# Patient Record
Sex: Male | Born: 1960 | Race: Black or African American | Hispanic: No | Smoking: Current every day smoker
Health system: Southern US, Community
[De-identification: ages and names within clinical notes are randomized; demographics above are authoritative.]

## PROBLEM LIST (undated history)

## (undated) DIAGNOSIS — E119 Type 2 diabetes mellitus without complications: Secondary | ICD-10-CM

## (undated) DIAGNOSIS — F3164 Bipolar disorder, current episode mixed, severe, with psychotic features: Secondary | ICD-10-CM

## (undated) DIAGNOSIS — R600 Localized edema: Secondary | ICD-10-CM

## (undated) DIAGNOSIS — F32A Depression, unspecified: Secondary | ICD-10-CM

## (undated) DIAGNOSIS — F209 Schizophrenia, unspecified: Secondary | ICD-10-CM

## (undated) DIAGNOSIS — I10 Essential (primary) hypertension: Secondary | ICD-10-CM

## (undated) DIAGNOSIS — M797 Fibromyalgia: Secondary | ICD-10-CM

## (undated) DIAGNOSIS — J189 Pneumonia, unspecified organism: Secondary | ICD-10-CM

## (undated) DIAGNOSIS — M1990 Unspecified osteoarthritis, unspecified site: Secondary | ICD-10-CM

## (undated) DIAGNOSIS — F1920 Other psychoactive substance dependence, uncomplicated: Secondary | ICD-10-CM

## (undated) DIAGNOSIS — Z720 Tobacco use: Secondary | ICD-10-CM

## (undated) DIAGNOSIS — R079 Chest pain, unspecified: Secondary | ICD-10-CM

## (undated) DIAGNOSIS — I219 Acute myocardial infarction, unspecified: Secondary | ICD-10-CM

## (undated) DIAGNOSIS — R739 Hyperglycemia, unspecified: Secondary | ICD-10-CM

## (undated) DIAGNOSIS — M545 Low back pain, unspecified: Secondary | ICD-10-CM

## (undated) DIAGNOSIS — J181 Lobar pneumonia, unspecified organism: Secondary | ICD-10-CM

## (undated) DIAGNOSIS — M5136 Other intervertebral disc degeneration, lumbar region: Secondary | ICD-10-CM

## (undated) DIAGNOSIS — F419 Anxiety disorder, unspecified: Secondary | ICD-10-CM

## (undated) DIAGNOSIS — E7800 Pure hypercholesterolemia, unspecified: Secondary | ICD-10-CM

## (undated) DIAGNOSIS — I209 Angina pectoris, unspecified: Secondary | ICD-10-CM

## (undated) DIAGNOSIS — F329 Major depressive disorder, single episode, unspecified: Secondary | ICD-10-CM

## (undated) DIAGNOSIS — R0602 Shortness of breath: Secondary | ICD-10-CM

## (undated) DIAGNOSIS — F14151 Cocaine abuse with cocaine-induced psychotic disorder with hallucinations: Secondary | ICD-10-CM

## (undated) DIAGNOSIS — K219 Gastro-esophageal reflux disease without esophagitis: Secondary | ICD-10-CM

## (undated) DIAGNOSIS — F250 Schizoaffective disorder, bipolar type: Secondary | ICD-10-CM

## (undated) DIAGNOSIS — M51369 Other intervertebral disc degeneration, lumbar region without mention of lumbar back pain or lower extremity pain: Secondary | ICD-10-CM

## (undated) DIAGNOSIS — G8929 Other chronic pain: Secondary | ICD-10-CM

## (undated) DIAGNOSIS — F2089 Other schizophrenia: Secondary | ICD-10-CM

## (undated) HISTORY — DX: Pneumonia, unspecified organism: J18.9

## (undated) HISTORY — DX: Schizoaffective disorder, bipolar type: F25.0

## (undated) HISTORY — DX: Type 2 diabetes mellitus without complications: E11.9

## (undated) HISTORY — DX: Localized edema: R60.0

## (undated) HISTORY — DX: Bipolar disorder, current episode mixed, severe, with psychotic features: F31.64

## (undated) HISTORY — DX: Chest pain, unspecified: R07.9

## (undated) HISTORY — DX: Other schizophrenia: F20.89

## (undated) HISTORY — PX: INGUINAL HERNIA REPAIR: SUR1180

## (undated) HISTORY — DX: Angina pectoris, unspecified: I20.9

## (undated) HISTORY — DX: Shortness of breath: R06.02

## (undated) HISTORY — DX: Lobar pneumonia, unspecified organism: J18.1

## (undated) HISTORY — DX: Hyperglycemia, unspecified: R73.9

## (undated) HISTORY — DX: Other psychoactive substance dependence, uncomplicated: F19.20

## (undated) HISTORY — DX: Unspecified osteoarthritis, unspecified site: M19.90

## (undated) HISTORY — DX: Cocaine abuse with cocaine-induced psychotic disorder with hallucinations: F14.151

## (undated) HISTORY — DX: Tobacco use: Z72.0

## (undated) HISTORY — PX: PARATHYROIDECTOMY: SHX19

---

## 1987-09-05 DIAGNOSIS — J189 Pneumonia, unspecified organism: Secondary | ICD-10-CM

## 1987-09-05 HISTORY — DX: Pneumonia, unspecified organism: J18.9

## 1998-09-04 HISTORY — PX: PERICARDIAL TAP: SHX6020

## 2000-09-04 DIAGNOSIS — I219 Acute myocardial infarction, unspecified: Secondary | ICD-10-CM

## 2000-09-04 HISTORY — DX: Acute myocardial infarction, unspecified: I21.9

## 2005-10-20 ENCOUNTER — Emergency Department (HOSPITAL_COMMUNITY): Admission: EM | Admit: 2005-10-20 | Discharge: 2005-10-20 | Payer: Self-pay | Admitting: Emergency Medicine

## 2006-10-01 ENCOUNTER — Emergency Department (HOSPITAL_COMMUNITY): Admission: EM | Admit: 2006-10-01 | Discharge: 2006-10-01 | Payer: Self-pay | Admitting: Emergency Medicine

## 2006-12-04 HISTORY — PX: CARDIAC CATHETERIZATION: SHX172

## 2006-12-24 ENCOUNTER — Emergency Department (HOSPITAL_COMMUNITY): Admission: EM | Admit: 2006-12-24 | Discharge: 2006-12-24 | Payer: Self-pay | Admitting: Emergency Medicine

## 2006-12-29 ENCOUNTER — Inpatient Hospital Stay (HOSPITAL_COMMUNITY): Admission: EM | Admit: 2006-12-29 | Discharge: 2007-01-01 | Payer: Self-pay | Admitting: Emergency Medicine

## 2006-12-29 ENCOUNTER — Encounter (INDEPENDENT_AMBULATORY_CARE_PROVIDER_SITE_OTHER): Payer: Self-pay | Admitting: *Deleted

## 2007-07-25 ENCOUNTER — Emergency Department (HOSPITAL_COMMUNITY): Admission: EM | Admit: 2007-07-25 | Discharge: 2007-07-25 | Payer: Self-pay | Admitting: Emergency Medicine

## 2011-01-20 NOTE — Consult Note (Signed)
NAME:  Charles Orr                   ACCOUNT NO.:  [id number]   MEDICAL RECORD NO.:  [id number]          PATIENT TYPE:  INP   LOCATION:  3731                         FACILITY:  [facility]   PHYSICIAN:  Deanna Artis. Hickling, M.D.DATE OF BIRTH:  [birth year]   DATE OF CONSULTATION:  12/30/2006  DATE OF DISCHARGE:                                 CONSULTATION   CHIEF COMPLAINT:  Diplopia.   HISTORY OF PRESENT CONDITION:  Charles Orr is a 50 year old African-  American gentleman admitted to [facility] with chest pain and  alterations in his vision.   The patient was seen in the emergency room with complaints of  double  vision of one weeks' duration.  He had been seen in the emergency room  the day before complaining of blurred vision, evaluated with a CT scan  of the brain, and discharged home.   The patient had substernal chest pain while mowing his yard, had a  burning feeling and felt that he could not see well.  Shortness of  breath and nausea lasted for at least 2 minutes.   The patient was admitted to the hospital, and evaluation suggested the  need for cardiac catheterization.   I was asked to see the patient because of persistent complaints of  double vision with left gaze.   The patient has an 18-year history of diabetes mellitus that is non-  insulin dependent.  He has a history of hypertension and feeling of  dizziness that began coincident with his double vision.   REVIEW OF SYSTEMS:  The patient has not had any fever or constitutional  symptoms.  He has had normal appetite.  He has not had intercurrent  infections in head, neck lungs, GI, GU, rash, and anemia, or bruises.  Diabetes or thyroid disease.  He says in the past he has had bronchitis.  The remainder of his 12-system review is recorded on the chart and is  negative.   PAST MEDICAL HISTORY:  Serious illnesses.   PAST SURGICAL HISTORY:  None.   CURRENT MEDICATIONS:  1. Aspirin 325 mg per day.  2.  Metoprolol 25 mg b.i.d.  3. Colace 200 mg daily.  4. Protonix 40 mg daily.  5. Hydrochlorothiazide 25 mg daily.   DRUG ALLERGIES:  None known.   FAMILY HISTORY:  Negative for coronary artery disease.  There is history  of hypertension and diabetes.  No history of stroke.   SOCIAL HISTORY:  The patient is one-pack-per-day smoker.  He used  cocaine, last time a week ago.   PHYSICAL EXAMINATION:  GENERAL:  On examination today, this is a well-  developed, well-nourished gentleman in no acute distress.  VITAL SIGNS:  Temperature 97.4, blood pressure 99/54, resting pulse 65,  respirations 20, oxygen saturation 97%.  EARS, NOSE, AND THROAT:  No infections.  NECK:  No bruits.  Supple neck.  No meningismus.  LUNGS:  Clear to auscultation.  HEART:  No murmurs.  Pulses normal.  ABDOMEN:  Soft.  Bowel sounds normal.  No hepatosplenomegaly.  EXTREMITIES:  Were normal.  NEUROLOGIC:  The  patient is alert. No dysphasia, dysarthria, dyspraxia.  Cranial nerves:  Round reactive pupils.  Normal fundi.  Extraocular  movements show decreased abduction of the left eye and diplopia with  left gaze. Symmetric facial strength otherwise. Midline tongue and  uvula.  Air conduction greater than bone conduction.  Motor examination:  Normal strength, tone and mass.  Good fine motor movements.  No pronator  drift.  Sensation: Stocking neuropathy.  Good stereognosis.  Cerebellar  examination:  Good finger-to-nose. Rapid movements, heel-knee-shin were  good.  In gait, he falls backwards and to the right when his eyes were  closed or when he tries to tandem. Otherwise his gait was quite normal.  Reflexes were diminished.  The patient had bilateral flexor plantar  responses.   IMPRESSION:  1. 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  This is very common with diabetes.  2. The patient has disequilibrium and falling backwards and to the      right.  This  could not be easily accounted for by his sixth nerve      palsy.   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  There is nothing to  suggest an acute process that began a week ago.  His MRA is normal.  He  has multiple risk factors for stroke including smoking, 18-year history  of  diabetes mellitus, hypertension and cocaine use.   PLAN:  1. We will patch his eye.  This can be alternated. He needs to see an      eye doctor for further evaluation of the sixth nerve.  2. Optimize treatment of diabetes.  3. Check lipid panel, homocysteine, PT for gait.  4. Continue aspirin.  5. Smoking cessation.  6. Cocaine use is a risk factor for stroke and needs to be stopped.   I appreciate the opportunity to participate in his care.  The patient is  appropriately concerned with his condition.      Deanna Artis. Sharene Skeans, M.D.  Electronically Signed    [facility]/MEDQ  D:  12/30/2006  T:  12/30/2006  Job:  [id number]   cc:   Dani Gobble, MD

## 2011-01-20 NOTE — H&P (Signed)
NAME:  Charles Orr, Charles Orr                   ACCOUNT NO.:  [id number]   MEDICAL RECORD NO.:  [id number]          PATIENT TYPE:  INP   LOCATION:  3731                         FACILITY:  [facility]   PHYSICIAN:  Dani Gobble, MD       DATE OF BIRTH:  09/07/1960   DATE OF ADMISSION:  12/29/2006  DATE OF DISCHARGE:                              HISTORY & PHYSICAL   CHIEF COMPLAINT:  Chest pain.   HISTORY OF PRESENT ILLNESS:  The patient is a 50 year old male who was  admitted to the emergency room tonight with complaints of substernal  chest pain and double vision.  He was noted to have an abnormal EKG with  inferior T wave inversion and ST elevation in lead 1 and AVL.  He was  seen in the emergency room last week for blurred vision in 1 eye.  Yesterday, the patient noticed some substernal chest pain and shortness  of breath after mowing his yard.  He described a burning chest pain with  shortness of breath and nausea that lasted about 2 to 5 minutes.  He has  no prior history of coronary disease.  He is seen now in the emergency  room for further evaluation after his EKG in the emergency room was  abnormal.   PAST MEDICAL HISTORY:  Remarkable for what sounds like a pericardial  effusion tap in 2000 in [place].  He says 2 cardiologists stuck needles  in his chest and drew out fluid.  He has no prior history of coronary  disease or chest pain.  When he was seen for his blurred vision a week  ago, he was diagnosed with hypertension and put on a diuretic.  He does  have a history of diabetes.  He has been without primary care for more  than a year.  His original primary care doctor moved away.  He has had a  remote right hernia repair.   CURRENT MEDICATIONS:  1. Metformin b.i.d.  2. HCTZ b.i.d.  3. Meclizine p.r.n.   ALLERGIES:  HE HAS NO KNOWN DRUG ALLERGIES.   SOCIAL HISTORY:  He is a pack-a-day smoker.  He does maintenance and  yard work.  He does admit to some intermittent cocaine use, the  last use  was more than a week ago.   FAMILY HISTORY:  Unremarkable for coronary disease.   REVIEW OF SYSTEMS:  Essentially unremarkable, except for noted above, he  has complained of double vision in his left eye for about 2 weeks.   PHYSICAL EXAMINATION:  VITAL SIGNS:  Blood pressure 130/90, pulse 82,  respirations 12.  GENERAL:  He is a well-developed, well-nourished, African American male  in no acute distress.  HEENT:  Normocephalic.  Extraocular movements are intact.  Sclerae are  not icteric.  NECK:  Without bruits or JVD.  Thyroid is not enlarged.  CHEST:  Clear to auscultation and percussion.  CARDIOVASCULAR:  Regular rate and rhythm without obvious murmurs, rubs,  or gallop.  Positive S4.  ABDOMEN:  Nontender.  No hepatosplenomegaly.  No bruits.  Bowel sounds  are  present.  EXTREMITIES:  Without edema.  He has 2+/4 distal pulses.  NEURO EXAM:  Grossly intact.  He is awake, alert, oriented, and  cooperative.  Moves all extremities without obvious deficit.  SKIN:  Warm and dry.   LABORATORY DATA:  White count 9.2, hemoglobin 16.9, hematocrit 50.5,  platelets 269.  Sodium 130, potassium 4.3, BUN 22, creatinine 0.9,  glucose 285.  CK, MB, and troponin are negative x1.   An MRI scan of his head is negative.   IMPRESSION:  1. Unstable angina.  2. Abnormal electrocardiogram.  3. Hypertension, newly diagnosed and poorly controlled.  4. Non-insulin dependent diabetes.  5. Smoking.  6. Past history of cocaine use.  7. Blurred vision in his left eye for 2 weeks of unclear etiology.   PLAN:  The patient was admitted to telemetry, started on heparin.  We  will add nitrate.  He will be ruled out for an MI.  We will go ahead and  put him on an aspirin and beta blocker and get an echo, he will probably  need diagnostic catheterization.  The patient was seen by Dr. Domingo Sep  and myself in the emergency room.      Abelino Derrick, P.A.    ______________________________   Dani Gobble, MD    [facility]/MEDQ  D:  01/01/2007  T:  01/01/2007  Job:  [id number]

## 2011-01-20 NOTE — Discharge Summary (Signed)
NAME:  Charles Orr, Charles Orr                   ACCOUNT NO.:  [id number]   MEDICAL RECORD NO.:  [id number]          PATIENT TYPE:  INP   LOCATION:  3731                         FACILITY:  [facility]   PHYSICIAN:  Abelino Derrick, P.A.   DATE OF BIRTH:  05/30/1961   DATE OF ADMISSION:  12/29/2006  DATE OF DISCHARGE:  01/01/2007                               DISCHARGE SUMMARY   DISCHARGE DIAGNOSIS:  1. Chest pain consistent with unstable angina.  2. Abnormal EKG.  3. Normal coronaries at catheterization.  4. Hypertension with mild LV dysfunction with an EF of 45% at cath.  5. 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  6. History of smoking.  7. Hypertension, newly diagnosed and poorly controlled, improved at      discharge   HOSPITAL COURSE:  Charles Orr is a 50 year old male who was admitted  through the emergency room 12/29/2006.  He had been seen in the  emergency room about a week or so ago with some blurred vision in one  eye.  He was given meclizine.  He was also started on a diuretic at that  time for hypertension.  He continued to have some intermittent left eye  diplopia.  On the day before admission he had some exertional chest pain  associated with some shortness of breath and nausea.  He described a  with midsternal chest burning sensation after mowing his yard.  He came  back to the emergency room on the 26th.  His EKG was abnormal with  inferior T-wave inversion and ST elevation in V1 and AVL.  The patient  was seen by Dr. Domingo Sep on admission and then admitted to telemetry,  started on heparin and nitrates.  He also had a beta blocker and  aspirin, PPI and held his metformin.  His past medical history was  remarkable for diabetes.  He also describes what sounded like a  pericardial tap in [place] in 2000.  He has not seen a family doctor  for some time, apparently his primary care doctor moved away.  The  patient's enzymes were negative  for an MI.  He was seen in consult by  Dr. Sharene Skeans with the neurology service.  MRI was essentially negative  for acute stroke.  Dr. Sharene Skeans felt he had sixth nerve neuropathy from  diabetes.  The patient underwent diagnostic catheterization 12/31/2006.  This revealed normal coronaries with an EF of 45-50%.  We feel he can be  discharged 01/01/2007.   DISCHARGE MEDICATIONS:  1. Enteric coated aspirin 325 mg a day  2. Lisinopril HCTZ 10/12.5 once a day  3. Metformin 500 mg twice a day to start Thursday  4. Glucotrol XL 5 mg a day  5. Coreg 25 mg one-half b.i.d.   LABS:  A CK-MB and troponins were negative.  Hemoglobin A1c was 12.5.  INR is 1.2.  Lipid panel shows a cholesterol of 157, triglycerides 306,  HDL 25, LDL  71.  Drug screen was negative for cocaine or other drugs of  abuse. Urinalysis  did show some glucosuria and ketones.  Renal function  at discharge shows sodium 137, potassium 4.1, CO2 26, BUN 11, creatinine  0.8, white count 6.3, hemoglobin 15, hematocrit 44.4, platelets 232.  MRI of the brain done on the 26th showed no acute infarct, nonspecific  white matter changes.  Chest X-Ray: Showed  low lung volumes and  atelectasis at the bases.  Telemetry showed normal sinus rhythm without  arrhythmia.   DISPOSITION:  Patient is discharged in stable condition.  He was  admitted by Dr. Domingo Sep but lives in [place], he will follow up with  Dr. Jenne Campus since Dr. Jenne Campus did his cath.  He will need a referral to  a primary care doctor.      Abelino Derrick, P.Memory Dance  D:  01/01/2007  T:  01/01/2007  Job:  [id number]   cc:   Darlin Priestly, MD

## 2011-01-20 NOTE — Cardiovascular Report (Signed)
NAME:  [name], [name] NO.:  [id number]   MEDICAL RECORD NO.:  [id number]          PATIENT TYPE:  INP   LOCATION:  3731                         FACILITY:  [facility]   PHYSICIAN:  Darlin Priestly, MD  DATE OF BIRTH:  [birth year]   DATE OF PROCEDURE:  12/31/2006  DATE OF DISCHARGE:                            CARDIAC CATHETERIZATION   [name] is a 50 year old male with a history of tobacco use, cocaine  use, non-insulin dependent diabetes mellitus.  He was admitted on December 29, 2006, with substernal chest pain and abnormal EKG.  He is  subsequently ruled out for myocardial infarction.  He is now brought for  cardiac catheterization to rule out significant CAD.   DESCRIPTION OF OPERATION:  After obtaining informed consent, the patient  was brought to the cardiac cath lab, where the right and left groin were  shaved, prepped and draped in the usual sterile fashion.  An ECG monitor  was established.  Using the modified Seldinger technique, a #6 French  arterial sheath inserted in the right femoral artery.  A 6-French  diagnostic catheter was then performed.  Diagnostic angiography was  done.   The left main is a large vessel with no significant disease.   The LAD is a large vessel which coursed through the apex and gives rise  to two diagonal branches.  The LAD has no severe disease.   The first diagonal is a medium-sized vessel which bifurcates distally  with no significant disease.   The second diagonal is a small-to-medium-sized vessel with no  significant disease.   The left circumflex is a medium-sized vessel coursing the AV groove and  gives rise to two obtuse marginal branches.  The AV circumflex has no  significant disease.   The first OM is a medium-sized vessel which bifurcates distally with no  significant disease.   The second OM is a small-sized with no significant disease.   The right coronary artery is a large vessel, dominant, and gives rise  to  both the PDA as well as the posterolateral branch.  There is no  significant disease in the RCA and PDA and posterolateral branch.   The left ventriculogram reveals a mildly-depressed EF of 45-50% with  mild anterolateral hypokinesis.   Hemodynamic systemic arterial pressure 101/65, LV systemic system  pressure of 101/2, LVEDP of 8.   CONCLUSIONS:  1. No significant coronary artery disease.  2. Mildly-depressed left ventricular systolic function.      Darlin Priestly, MD  Electronically Signed     [facility]/MEDQ  D:  12/31/2006  T:  12/31/2006  Job:  [id number]

## 2015-09-02 ENCOUNTER — Emergency Department (HOSPITAL_COMMUNITY)
Admission: EM | Admit: 2015-09-02 | Discharge: 2015-09-02 | Disposition: A | Discharge status: Home / Self Care | Payer: Medicaid Other | Attending: Emergency Medicine | Admitting: Emergency Medicine

## 2015-09-02 ENCOUNTER — Encounter (HOSPITAL_COMMUNITY): Payer: Self-pay

## 2015-09-02 DIAGNOSIS — F17200 Nicotine dependence, unspecified, uncomplicated: Secondary | ICD-10-CM | POA: Diagnosis not present

## 2015-09-02 DIAGNOSIS — E119 Type 2 diabetes mellitus without complications: Secondary | ICD-10-CM | POA: Insufficient documentation

## 2015-09-02 DIAGNOSIS — E1140 Type 2 diabetes mellitus with diabetic neuropathy, unspecified: Secondary | ICD-10-CM | POA: Diagnosis not present

## 2015-09-02 DIAGNOSIS — M5416 Radiculopathy, lumbar region: Secondary | ICD-10-CM

## 2015-09-02 DIAGNOSIS — R200 Anesthesia of skin: Secondary | ICD-10-CM | POA: Diagnosis not present

## 2015-09-02 DIAGNOSIS — M545 Low back pain: Secondary | ICD-10-CM | POA: Diagnosis present

## 2015-09-02 DIAGNOSIS — I10 Essential (primary) hypertension: Secondary | ICD-10-CM | POA: Diagnosis not present

## 2015-09-02 DIAGNOSIS — G8929 Other chronic pain: Secondary | ICD-10-CM | POA: Insufficient documentation

## 2015-09-02 HISTORY — DX: Other intervertebral disc degeneration, lumbar region without mention of lumbar back pain or lower extremity pain: M51.369

## 2015-09-02 HISTORY — DX: Other intervertebral disc degeneration, lumbar region: M51.36

## 2015-09-02 HISTORY — DX: Essential (primary) hypertension: I10

## 2015-09-02 LAB — CBG MONITORING, ED: Glucose-Capillary: 303 mg/dL — ABNORMAL HIGH (ref 65–99)

## 2015-09-02 MED ORDER — INSULIN ASPART PROT & ASPART (70-30 MIX) 100 UNIT/ML ~~LOC~~ SUSP: 20.0000 [IU] | Freq: Two times a day (BID) | SUBCUTANEOUS | Status: DC

## 2015-09-02 MED ORDER — AMLODIPINE BESYLATE 10 MG PO TABS: 10.0000 mg | ORAL_TABLET | Freq: Every day | ORAL | Status: DC

## 2015-09-02 MED ORDER — GABAPENTIN 600 MG PO TABS: 600.0000 mg | ORAL_TABLET | Freq: Two times a day (BID) | ORAL | Status: DC

## 2015-09-02 MED ORDER — ONDANSETRON 8 MG PO TBDP
8.0000 mg | ORAL_TABLET | Freq: Once | ORAL | Status: AC
  Administered 2015-09-02: 8 mg via ORAL
  Filled 2015-09-02: qty 1

## 2015-09-02 MED ORDER — HYDROMORPHONE HCL 2 MG/ML IJ SOLN
2.0000 mg | Freq: Once | INTRAMUSCULAR | Status: AC
  Administered 2015-09-02: 2 mg via INTRAMUSCULAR
  Filled 2015-09-02: qty 1

## 2015-09-02 MED ORDER — GLIPIZIDE 5 MG PO TABS: 5.0000 mg | ORAL_TABLET | Freq: Every day | ORAL | Status: DC

## 2015-09-02 MED ORDER — METFORMIN HCL 1000 MG PO TABS: 1000.0000 mg | ORAL_TABLET | Freq: Two times a day (BID) | ORAL | Status: DC

## 2015-09-02 MED ORDER — CELECOXIB 200 MG PO CAPS: 200.0000 mg | ORAL_CAPSULE | Freq: Two times a day (BID) | ORAL | Status: DC

## 2015-09-02 MED ORDER — CYCLOBENZAPRINE HCL 10 MG PO TABS: 10.0000 mg | ORAL_TABLET | Freq: Three times a day (TID) | ORAL | Status: DC | PRN

## 2015-09-02 MED ORDER — OXYCODONE-ACETAMINOPHEN 5-325 MG PO TABS: 1.0000 | ORAL_TABLET | ORAL | Status: DC | PRN

## 2015-09-02 NOTE — ED Notes (Signed)
Patient c/o lower back pain that radiates down bilateral legs X3 weeks.

## 2015-09-02 NOTE — ED Provider Notes (Signed)
CSN: [id number]     Arrival date & time 09/02/15  2130 History   First MD Initiated Contact with Patient 09/02/15 [id number]     Chief Complaint  Patient presents with  . Back Pain     (Consider location/radiation/quality/duration/timing/severity/associated sxs/prior Treatment) HPI Comments: Patient presents with his father for evaluation of low back pain. Patient has a history of degenerative disc disease in his low back, has a history of chronic back pain. Patient reports that over the last 3 weeks, however, he has been noticing increased pain in his lower back with radiation to both legs. He reports that the radiation is worse in the right leg. He has not noticed any weakness. There is increased numbness and tingling. He has a history of diabetic neuropathy, however, has chronic numbness in his lower extremities. Patient has not had any change in his bowel or bladder function. There has not been any recent injury. Patient lives in [place], has a primary doctor in [place]. He is currently here visiting his family.  Patient is a 54 y.o. male presenting with back pain.  Back Pain Associated symptoms: numbness     Past Medical History  Diagnosis Date  . Hypertension   . Diabetes mellitus without complication (HCC)   . Degenerative disc disease, lumbar    Past Surgical History  Procedure Laterality Date  . Hernia repair    . Cardiac surgery    . Parathyroidectomy     History reviewed. No pertinent family history. Social History  Substance Use Topics  . Smoking status: Current Every Day Smoker  . Smokeless tobacco: None  . Alcohol Use: No    Review of Systems  Musculoskeletal: Positive for back pain.  Neurological: Positive for numbness.  All other systems reviewed and are negative.     Allergies  Review of patient's allergies indicates no known allergies.  Home Medications   Prior to Admission medications   Not on File   BP 146/98 mmHg  Pulse 105  Temp(Src) 97.7 F  (36.5 C) (Oral)  Resp 20  Ht  (1.88 m)  Wt 233 lb (105.688 kg)  BMI 29.90 kg/m2  SpO2 100% Physical Exam  Constitutional: He is oriented to person, place, and time. He appears well-developed and well-nourished. No distress.  HENT:  Head: Normocephalic and atraumatic.  Right Ear: Hearing normal.  Left Ear: Hearing normal.  Nose: Nose normal.  Mouth/Throat: Oropharynx is clear and moist and mucous membranes are normal.  Eyes: Conjunctivae and EOM are normal. Pupils are equal, round, and reactive to light.  Neck: Normal range of motion. Neck supple.  Cardiovascular: Regular rhythm, S1 normal and S2 normal.  Exam reveals no gallop and no friction rub.   No murmur heard. Pulmonary/Chest: Effort normal and breath sounds normal. No respiratory distress. He exhibits no tenderness.  Abdominal: Soft. Normal appearance and bowel sounds are normal. There is no hepatosplenomegaly. There is no tenderness. There is no rebound, no guarding, no tenderness at McBurney's point and negative Murphy's sign. No hernia.  Musculoskeletal: Normal range of motion.       Lumbar back: He exhibits tenderness.       Back:  Neurological: He is alert and oriented to person, place, and time. He has normal strength. No cranial nerve deficit or sensory deficit. Coordination normal. GCS eye subscore is 4. GCS verbal subscore is 5. GCS motor subscore is 6.  Skin: Skin is warm, dry and intact. No rash noted. No cyanosis.  Psychiatric: He has a  normal mood and affect. His speech is normal and behavior is normal. Thought content normal.  Nursing note and vitals reviewed.   ED Course  Procedures (including critical care time) Labs Review Labs Reviewed - No data to display  Imaging Review No results found. I have personally reviewed and evaluated these images and lab results as part of my medical decision-making.   EKG Interpretation None      MDM   Final diagnoses:  None   lumbar  radiculopathy  Presents to the ER for evaluation of low back pain. Patient has history of chronic low back pain. He reports a history of degenerative disc disease. Patient does not have any strength deficit is lower extremities. Reflexes are present. Sensation is appropriate for him, considering he has bilateral diabetic neuropathy. There is no saddle anesthesia or foot drop. Patient is currently in the area visiting family. I have counseled him that he does need to schedule follow-up with his primary doctor in [place], as he may need formal MRI or other testing if he does not improve. He does not, however, require emergent imaging. Patient is diabetic and his blood sugar is somewhat uncontrolled. Blood sugar was 303 this morning. He did, however, take 20 units of 7030 before arrival. He will not, therefore, be given any additional insulin, but I do not feel comfortable prescribing him prednisone for his back. Patient therefore treated with analgesia, muscle relaxer and follow-up as described above.    Gilda Creasehristopher J Mechell Girgis, MD 09/02/15 [id number]

## 2015-09-02 NOTE — Discharge Instructions (Signed)
Lumbosacral Radiculopathy °Lumbosacral radiculopathy is a condition that involves the spinal nerves and nerve roots in the low back and bottom of the spine. The condition develops when these nerves and nerve roots move out of place or become inflamed and cause symptoms. °CAUSES °This condition may be caused by: °· Pressure from a disk that bulges out of place (herniated disk). A disk is a plate of cartilage that separates bones in the spine. °· Disk degeneration. °· A narrowing of the bones of the lower back (spinal stenosis). °· A tumor. °· An infection. °· An injury that places sudden pressure on the disks that cushion the bones of your lower spine. °RISK FACTORS °This condition is more likely to develop in: °· Males aged 30-50 years. °· Females aged 50-60 years. °· People who lift improperly. °· People who are overweight or live a sedentary lifestyle. °· People who smoke. °· People who perform repetitive activities that strain the spine. °SYMPTOMS °Symptoms of this condition include: °· Pain that goes down from the back into the legs (sciatica). This is the most common symptom. The pain may be worse with sitting, coughing, or sneezing. °· Pain and numbness in the arms and legs. °· Muscle weakness. °· Tingling. °· Loss of bladder control or bowel control. °DIAGNOSIS °This condition is diagnosed with a physical exam and medical history. If the pain is lasting, you may have tests, such as: °· MRI scan. °· X-ray. °· CT scan. °· Myelogram. °· Nerve conduction study. °TREATMENT °This condition is often treated with: °· Hot packs and ice applied to affected areas. °· Stretches to improve flexibility. °· Exercises to strengthen back muscles. °· Physical therapy. °· Pain medicine. °· A steroid injection in the spine. °In some cases, no treatment is needed. If the condition is long-lasting (chronic), or if symptoms are severe, treatment may involve surgery or lifestyle changes, such as following a weight loss plan. °HOME  CARE INSTRUCTIONS °Medicines °· Take medicines only as directed by your health care provider. °· Do not drive or operate heavy machinery while taking pain medicine. °Injury Care °· Apply a heat pack to the injured area as directed by your health care provider. °· Apply ice to the affected area: °¨ Put ice in a plastic bag. °¨ Place a towel between your skin and the bag. °¨ Leave the ice on for 20-30 minutes, every 2 hours while you are awake or as needed. Or, leave the ice on for as long as directed by your health care provider. °Other Instructions °· If you were shown how to do any exercises or stretches, do them as directed by your health care provider. °· If your health care provider prescribed a diet or exercise program, follow it as directed. °· Keep all follow-up visits as directed by your health care provider. This is important. °SEEK MEDICAL CARE IF: °· Your pain does not improve over time even when taking pain medicines. °SEEK IMMEDIATE MEDICAL CARE IF: °· Your develop severe pain. °· Your pain suddenly gets worse. °· You develop increasing weakness in your legs. °· You lose the ability to control your bladder or bowel. °· You have difficulty walking or balancing. °· You have a fever. °  °This information is not intended to replace advice given to you by your health care provider. Make sure you discuss any questions you have with your health care provider. °  °Document Released: 08/21/2005 Document Revised: 01/05/2015 Document Reviewed: 08/17/2014 °Elsevier Interactive Patient Education ©2016 [employer] ° °

## 2015-11-04 ENCOUNTER — Emergency Department (HOSPITAL_COMMUNITY)
Admission: EM | Admit: 2015-11-04 | Discharge: 2015-11-04 | Disposition: A | Discharge status: Home / Self Care | Payer: Medicaid Other | Attending: Emergency Medicine | Admitting: Emergency Medicine

## 2015-11-04 ENCOUNTER — Encounter (HOSPITAL_COMMUNITY): Payer: Self-pay

## 2015-11-04 DIAGNOSIS — Z79899 Other long term (current) drug therapy: Secondary | ICD-10-CM | POA: Insufficient documentation

## 2015-11-04 DIAGNOSIS — Z791 Long term (current) use of non-steroidal anti-inflammatories (NSAID): Secondary | ICD-10-CM | POA: Insufficient documentation

## 2015-11-04 DIAGNOSIS — G8929 Other chronic pain: Secondary | ICD-10-CM

## 2015-11-04 DIAGNOSIS — R202 Paresthesia of skin: Secondary | ICD-10-CM | POA: Diagnosis not present

## 2015-11-04 DIAGNOSIS — E119 Type 2 diabetes mellitus without complications: Secondary | ICD-10-CM | POA: Insufficient documentation

## 2015-11-04 DIAGNOSIS — I10 Essential (primary) hypertension: Secondary | ICD-10-CM | POA: Diagnosis not present

## 2015-11-04 DIAGNOSIS — Z794 Long term (current) use of insulin: Secondary | ICD-10-CM | POA: Insufficient documentation

## 2015-11-04 DIAGNOSIS — M545 Low back pain: Secondary | ICD-10-CM | POA: Diagnosis not present

## 2015-11-04 DIAGNOSIS — M549 Dorsalgia, unspecified: Secondary | ICD-10-CM

## 2015-11-04 DIAGNOSIS — Z7984 Long term (current) use of oral hypoglycemic drugs: Secondary | ICD-10-CM | POA: Diagnosis not present

## 2015-11-04 DIAGNOSIS — F17200 Nicotine dependence, unspecified, uncomplicated: Secondary | ICD-10-CM | POA: Insufficient documentation

## 2015-11-04 DIAGNOSIS — R531 Weakness: Secondary | ICD-10-CM | POA: Diagnosis not present

## 2015-11-04 LAB — CBG MONITORING, ED: Glucose-Capillary: 135 mg/dL — ABNORMAL HIGH (ref 65–99)

## 2015-11-04 MED ORDER — CYCLOBENZAPRINE HCL 10 MG PO TABS: 10.0000 mg | ORAL_TABLET | Freq: Two times a day (BID) | ORAL | Status: DC | PRN

## 2015-11-04 NOTE — Discharge Instructions (Signed)

## 2015-11-04 NOTE — ED Notes (Signed)
Pt c/o lower back pain x 2 months, denies injury or trauma, states pain shoots down both legs to his ankles.

## 2015-11-04 NOTE — ED Provider Notes (Signed)
CSN: [id number]     Arrival date & time 11/04/15  5784 History   First MD Initiated Contact with Patient 11/04/15 [phone]     Chief Complaint  Patient presents with  . Back Pain    Patient is a 55 y.o. male presenting with back pain. The history is provided by the patient.  Back Pain Location:  Lumbar spine Quality:  Aching Radiates to:  L foot and R foot Pain severity:  Moderate Pain is:  Same all the time Onset quality:  Gradual Duration: several months. Timing:  Constant Progression:  Worsening Chronicity:  Chronic Relieved by:  Nothing Worsened by:  Movement Associated symptoms: tingling and weakness   Associated symptoms: no abdominal pain, no bladder incontinence, no bowel incontinence and no fever   Risk factors: no recent surgery   pt reports chronic low back pain for months No recent injury No falls Reports he used to lift furniture which worsened his back over time No fever/vomiting He reports pain radiates to both feet, but has numbness/weakness to right LE which has been present for months No other associated symptoms No h/o back surgery He lives in [place] but is here locally to help family He is here for pain control this morning   Past Medical History  Diagnosis Date  . Hypertension   . Diabetes mellitus without complication (HCC)   . Degenerative disc disease, lumbar    Past Surgical History  Procedure Laterality Date  . Hernia repair    . Cardiac surgery    . Parathyroidectomy     No family history on file. Social History  Substance Use Topics  . Smoking status: Current Every Day Smoker  . Smokeless tobacco: None  . Alcohol Use: No    Review of Systems  Constitutional: Negative for fever.  Gastrointestinal: Negative for abdominal pain and bowel incontinence.  Genitourinary: Negative for bladder incontinence.  Musculoskeletal: Positive for back pain.  Neurological: Positive for tingling and weakness.  All other systems reviewed and are  negative.     Allergies  Review of patient's allergies indicates no known allergies.  Home Medications   Prior to Admission medications   Medication Sig Start Date End Date Taking? Authorizing Provider  amLODipine (NORVASC) 10 MG tablet Take 1 tablet (10 mg total) by mouth daily. 09/02/15  Yes Gilda Crease, MD  celecoxib (CELEBREX) 200 MG capsule Take 1 capsule (200 mg total) by mouth 2 (two) times daily. 09/02/15  Yes Gilda Crease, MD  gabapentin (NEURONTIN) 600 MG tablet Take 1 tablet (600 mg total) by mouth 2 (two) times daily. 09/02/15  Yes Gilda Crease, MD  glipiZIDE (GLUCOTROL) 5 MG tablet Take 1 tablet (5 mg total) by mouth daily before breakfast. 09/02/15  Yes Gilda Crease, MD  insulin aspart protamine- aspart (NOVOLOG MIX 70/30) (70-30) 100 UNIT/ML injection Inject 0.2 mLs (20 Units total) into the skin 2 (two) times daily with a meal. Dispense needles, syringes QS for twice daily injections for one month 09/02/15  Yes Gilda Crease, MD  metFORMIN (GLUCOPHAGE) 1000 MG tablet Take 1 tablet (1,000 mg total) by mouth 2 (two) times daily with a meal. 09/02/15  Yes Gilda Crease, MD  oxyCODONE-acetaminophen (PERCOCET) 5-325 MG tablet Take 1-2 tablets by mouth every 4 (four) hours as needed. 09/02/15  Yes Gilda Crease, MD  cyclobenzaprine (FLEXERIL) 10 MG tablet Take 1 tablet (10 mg total) by mouth 2 (two) times daily as needed for muscle spasms. 11/04/15  Zadie Rhine, MD   BP 150/85 mmHg  Pulse 78  Temp(Src) 97.9 F (36.6 C) (Oral)  Resp 18  Ht  (1.88 m)  Wt 105.688 kg  BMI 29.90 kg/m2  SpO2 100% Physical Exam CONSTITUTIONAL: Well developed/well nourished HEAD: Normocephalic/atraumatic EYES: EOMI/PERRL ENMT: Mucous membranes moist NECK: supple no meningeal signs SPINE/BACK:lumbar spinal tenderness, No bruising/crepitance/stepoffs noted to spine CV: S1/S2 noted, no murmurs/rubs/gallops noted LUNGS: Lungs are  clear to auscultation bilaterally, no apparent distress ABDOMEN: soft, nontender, no rebound or guarding GU:no cva tenderness NEURO: Awake/alert, 4/5 strength with right hip flexion (limited due to pain) and 4/5 strength with right foot plantar flexion, otherwise no focal weakness in the lower extremitites  great toe extension intact bilaterally, no clonus bilaterally, no sensory deficit in any dermatome.  Equal patellar/achilles reflex noted (2+) in bilateral lower extremities.  Pt is able to ambulate unassisted. EXTREMITIES: pulses normal, full ROM SKIN: warm, color normal PSYCH: no abnormalities of mood noted, alert and oriented to situation  ED Course  Procedures Labs Review Labs Reviewed  CBG MONITORING, ED - Abnormal; Notable for the following:    Glucose-Capillary 135 (*)    All other components within normal limits    I have personally reviewed and evaluated these lab results as part of my medical decision-making.  6:53 AM Pt here for chronic back pain he has had for months No new changes this morning Reports pain/numbness/weakness in right LE is not new this morning He does not have local physicians Advised need to continue home therapies and will give referral to PCP and neurosurgery I offered to give Rx for flexeril.  He wants something more for pain.  He also is requesting "a shot" for pain I advised that the emergency department does not treat chronic pain He reports he wants to leave  MDM   Final diagnoses:  Chronic back pain    Nursing notes including past medical history and social history reviewed and considered in documentation Previous records reviewed and considered Labs/vital reviewed myself and considered during evaluation     Zadie Rhine, MD 11/04/15 [phone]

## 2016-02-09 ENCOUNTER — Encounter (HOSPITAL_COMMUNITY): Payer: Self-pay | Admitting: Emergency Medicine

## 2016-02-09 ENCOUNTER — Emergency Department (HOSPITAL_COMMUNITY)
Admission: EM | Admit: 2016-02-09 | Discharge: 2016-02-09 | Disposition: A | Discharge status: Home / Self Care | Payer: Medicaid Other | Attending: Emergency Medicine | Admitting: Emergency Medicine

## 2016-02-09 DIAGNOSIS — Z79899 Other long term (current) drug therapy: Secondary | ICD-10-CM | POA: Insufficient documentation

## 2016-02-09 DIAGNOSIS — Z760 Encounter for issue of repeat prescription: Secondary | ICD-10-CM | POA: Diagnosis present

## 2016-02-09 DIAGNOSIS — F17200 Nicotine dependence, unspecified, uncomplicated: Secondary | ICD-10-CM | POA: Insufficient documentation

## 2016-02-09 DIAGNOSIS — E119 Type 2 diabetes mellitus without complications: Secondary | ICD-10-CM | POA: Insufficient documentation

## 2016-02-09 DIAGNOSIS — Z794 Long term (current) use of insulin: Secondary | ICD-10-CM | POA: Diagnosis not present

## 2016-02-09 DIAGNOSIS — I10 Essential (primary) hypertension: Secondary | ICD-10-CM | POA: Insufficient documentation

## 2016-02-09 DIAGNOSIS — Z7984 Long term (current) use of oral hypoglycemic drugs: Secondary | ICD-10-CM | POA: Insufficient documentation

## 2016-02-09 MED ORDER — METFORMIN HCL 1000 MG PO TABS: 1000.0000 mg | ORAL_TABLET | Freq: Two times a day (BID) | ORAL | Status: DC

## 2016-02-09 MED ORDER — PREGABALIN 50 MG PO CAPS: 50.0000 mg | ORAL_CAPSULE | Freq: Three times a day (TID) | ORAL | Status: DC

## 2016-02-09 MED ORDER — INSULIN ASPART PROT & ASPART (70-30 MIX) 100 UNIT/ML ~~LOC~~ SUSP: 20.0000 [IU] | Freq: Two times a day (BID) | SUBCUTANEOUS | Status: DC

## 2016-02-09 MED ORDER — AMLODIPINE BESYLATE 10 MG PO TABS: 10.0000 mg | ORAL_TABLET | Freq: Every day | ORAL | Status: DC

## 2016-02-09 NOTE — ED Notes (Signed)
Patient with no complaints at this time. Respirations even and unlabored. Skin warm/dry. Discharge instructions reviewed with patient at this time. Patient given opportunity to voice concerns/ask questions. Patient discharged at this time and left Emergency Department with steady gait.   

## 2016-02-09 NOTE — ED Provider Notes (Signed)
CSN: [id number]     Arrival date & time 02/09/16  1008 History   First MD Initiated Contact with Patient 02/09/16 1018     Chief Complaint  Patient presents with  . Medication Refill   Charles Orr is a 55 y.o. male Who presents to the emergency department requesting medication refills. The patient reports he is from [facility] and is currently staying in [place] to help take care of his sick mother. He reports that he ran out of his medications 3 days ago. He reports calling his primary care provider in [place] and has waited 3 days without hearing from them. He reports he is still been taking his diabetes medicine because his sister uses the same medicine. He reports he has not taken his blood pressure medicine. Requesting refills for metformin, insulin, amlodipine and Lyrica. He reports his blood sugars have been well controlled at home. Patient denies any complaints. He denies fevers, chest pain, abdominal pain, headache, changes to his vision, nausea, vomiting or rashes.  The history is provided by the patient. No language interpreter was used.    Past Medical History  Diagnosis Date  . Hypertension   . Diabetes mellitus without complication (HCC)   . Degenerative disc disease, lumbar    Past Surgical History  Procedure Laterality Date  . Hernia repair    . Cardiac surgery    . Parathyroidectomy     History reviewed. No pertinent family history. Social History  Substance Use Topics  . Smoking status: Current Every Day Smoker  . Smokeless tobacco: None  . Alcohol Use: No    Review of Systems  Constitutional: Negative for fever.  HENT: Negative for nosebleeds.   Eyes: Negative for visual disturbance.  Respiratory: Negative for cough and shortness of breath.   Cardiovascular: Negative for chest pain.  Gastrointestinal: Negative for nausea, vomiting and abdominal pain.  Genitourinary: Negative for dysuria.  Musculoskeletal: Negative for back pain and neck pain.   Skin: Negative for rash.  Neurological: Negative for syncope and headaches.      Allergies  Review of patient's allergies indicates no known allergies.  Home Medications   Prior to Admission medications   Medication Sig Start Date End Date Taking? Authorizing Provider  amLODipine (NORVASC) 10 MG tablet Take 1 tablet (10 mg total) by mouth daily. 02/09/16   Charles Farrier, PA-C  celecoxib (CELEBREX) 200 MG capsule Take 1 capsule (200 mg total) by mouth 2 (two) times daily. 09/02/15   Charles Crease, MD  cyclobenzaprine (FLEXERIL) 10 MG tablet Take 1 tablet (10 mg total) by mouth 2 (two) times daily as needed for muscle spasms. 11/04/15   Charles Rhine, MD  gabapentin (NEURONTIN) 600 MG tablet Take 1 tablet (600 mg total) by mouth 2 (two) times daily. 09/02/15   Charles Crease, MD  glipiZIDE (GLUCOTROL) 5 MG tablet Take 1 tablet (5 mg total) by mouth daily before breakfast. 09/02/15   Charles Crease, MD  insulin aspart protamine- aspart (NOVOLOG MIX 70/30) (70-30) 100 UNIT/ML injection Inject 0.2 mLs (20 Units total) into the skin 2 (two) times daily with a meal. Dispense needles, syringes QS for twice daily injections for one month 02/09/16   Charles Farrier, PA-C  metFORMIN (GLUCOPHAGE) 1000 MG tablet Take 1 tablet (1,000 mg total) by mouth 2 (two) times daily with a meal. 02/09/16   Charles Farrier, PA-C  oxyCODONE-acetaminophen (PERCOCET) 5-325 MG tablet Take 1-2 tablets by mouth every 4 (four) hours as needed. 09/02/15  Charles Creasehristopher J Pollina, MD  pregabalin (LYRICA) 50 MG capsule Take 1 capsule (50 mg total) by mouth 3 (three) times daily. 02/09/16   Charles FarrierWilliam Dushawn Pusey, PA-C   BP 154/98 mmHg  Pulse 81  Temp(Src) 98.5 F (36.9 C) (Oral)  Resp 18  Ht 6\' 2"  (1.88 m)  Wt 105.688 kg  BMI 29.90 kg/m2  SpO2 97% Physical Exam  Constitutional: He appears well-developed and well-nourished. No distress.  HENT:  Head: Normocephalic and atraumatic.  Right Ear: External ear normal.   Left Ear: External ear normal.  Mouth/Throat: Oropharynx is clear and moist.  Eyes: Conjunctivae are normal. Pupils are equal, round, and reactive to light. Right eye exhibits no discharge. Left eye exhibits no discharge.  Neck: Neck supple.  Cardiovascular: Normal rate, regular rhythm, normal heart sounds and intact distal pulses.   Pulmonary/Chest: Effort normal and breath sounds normal. No respiratory distress.  Abdominal: Soft. There is no tenderness.  Lymphadenopathy:    He has no cervical adenopathy.  Neurological: He is alert. Coordination normal.  Skin: Skin is warm and dry. No rash noted. He is not diaphoretic. No erythema. No pallor.  Psychiatric: He has a normal mood and affect. His behavior is normal.  Nursing note and vitals reviewed.   ED Course  Procedures (including critical care time) Labs Review Labs Reviewed - No data to display  Imaging Review No results found.    EKG Interpretation None     Filed Vitals:   02/09/16 1015  BP: 154/98  Pulse: 81  Temp: 98.5 F (36.9 C)  TempSrc: Oral  Resp: 18  Height: 6\' 2"  (1.88 m)  Weight: 105.688 kg  SpO2: 97%    MDM   Final diagnoses:  Medication refill  Essential hypertension   This s a 55 y.o. male Who presents to the emergency department requesting medication refills. The patient reports he is [place] [facility] [place] and is currently staying in [place] to help take care of his sick mother. He reports that he ran out of his medications 3 days ago. He reports calling his primary care provider in [place] and has waited 3 days without hearing from them. He reports he is still been taking his diabetes medicine because his sister uses the same medicine. He reports he has not taken his blood pressure medicine. Requesting refills for metformin, insulin, amlodipine and Lyrica. I provided the patient with refills but discussed that he needs to be following up with primary care for continued refills. I  discussed with the emergency department is not the place for refills on his medications. I advised the patient to follow-up with their primary care provider this week. I advised the patient to return to the emergency department with new or worsening symptoms or new concerns. The patient verbalized understanding and agreement with plan.       Charles FarrierWilliam Nohelia Valenza, PA-C 02/09/16 1041  Jacalyn LefevreJulie Haviland, MD 02/09/16 [phone]

## 2016-02-09 NOTE — Discharge Instructions (Signed)
Medicine Refill at the Emergency Department °We have refilled your medicine today, but it is best for you to get refills through your primary health care provider's office. In the future, please plan ahead so you do not need to get refills from the emergency department. °If the medicine we refilled was a maintenance medicine, you may have received only enough to get you by until you are able to see your regular health care provider. °  °This information is not intended to replace advice given to you by your health care provider. Make sure you discuss any questions you have with your health care provider. °  °Document Released: 12/08/2003 Document Revised: 09/11/2014 Document Reviewed: 11/28/2013 °Elsevier Interactive Patient Education ©2016 [employer] °Hypertension °Hypertension, commonly called high blood pressure, is when the force of blood pumping through your arteries is too strong. Your arteries are the blood vessels that carry blood from your heart throughout your body. A blood pressure reading consists of a higher number over a lower number, such as 110/72. The higher number (systolic) is the pressure inside your arteries when your heart pumps. The lower number (diastolic) is the pressure inside your arteries when your heart relaxes. Ideally you want your blood pressure below 120/80. °Hypertension forces your heart to work harder to pump blood. Your arteries may become narrow or stiff. Having untreated or uncontrolled hypertension can cause heart attack, stroke, kidney disease, and other problems. °RISK FACTORS °Some risk factors for high blood pressure are controllable. Others are not.  °Risk factors you cannot control include:  °· Race. You may be at higher risk if you are African American. °· Age. Risk increases with age. °· Gender. Men are at higher risk than women before age 45 years. After age 65, women are at higher risk than men. °Risk factors you can control include: °· Not getting enough exercise  or physical activity. °· Being overweight. °· Getting too much fat, sugar, calories, or salt in your diet. °· Drinking too much alcohol. °SIGNS AND SYMPTOMS °Hypertension does not usually cause signs or symptoms. Extremely high blood pressure (hypertensive crisis) may cause headache, anxiety, shortness of breath, and nosebleed. °DIAGNOSIS °To check if you have hypertension, your health care provider will measure your blood pressure while you are seated, with your arm held at the level of your heart. It should be measured at least twice using the same arm. Certain conditions can cause a difference in blood pressure between your right and left arms. A blood pressure reading that is higher than normal on one occasion does not mean that you need treatment. If it is not clear whether you have high blood pressure, you may be asked to return on a different day to have your blood pressure checked again. Or, you may be asked to monitor your blood pressure at home for 1 or more weeks. °TREATMENT °Treating high blood pressure includes making lifestyle changes and possibly taking medicine. Living a healthy lifestyle can help lower high blood pressure. You may need to change some of your habits. °Lifestyle changes may include: °· Following the DASH diet. This diet is high in fruits, vegetables, and whole grains. It is low in salt, red meat, and added sugars. °· Keep your sodium intake below 2,300 mg per day. °· Getting at least 30-45 minutes of aerobic exercise at least 4 times per week. °· Losing weight if necessary. °· Not smoking. °· Limiting alcoholic beverages. °· Learning ways to reduce stress. °Your health care provider may prescribe medicine   if lifestyle changes are not enough to get your blood pressure under control, and if one of the following is true: °· You are 18-59 years of age and your systolic blood pressure is above 140. °· You are 60 years of age or older, and your systolic blood pressure is above 150. °· Your  diastolic blood pressure is above 90. °· You have diabetes, and your systolic blood pressure is over 140 or your diastolic blood pressure is over 90. °· You have kidney disease and your blood pressure is above 140/90. °· You have heart disease and your blood pressure is above 140/90. °Your personal target blood pressure may vary depending on your medical conditions, your age, and other factors. °HOME CARE INSTRUCTIONS °· Have your blood pressure rechecked as directed by your health care provider.   °· Take medicines only as directed by your health care provider. Follow the directions carefully. Blood pressure medicines must be taken as prescribed. The medicine does not work as well when you skip doses. Skipping doses also puts you at risk for problems. °· Do not smoke.   °· Monitor your blood pressure at home as directed by your health care provider.  °SEEK MEDICAL CARE IF:  °· You think you are having a reaction to medicines taken. °· You have recurrent headaches or feel dizzy. °· You have swelling in your ankles. °· You have trouble with your vision. °SEEK IMMEDIATE MEDICAL CARE IF: °· You develop a severe headache or confusion. °· You have unusual weakness, numbness, or feel faint. °· You have severe chest or abdominal pain. °· You vomit repeatedly. °· You have trouble breathing. °MAKE SURE YOU:  °· Understand these instructions. °· Will watch your condition. °· Will get help right away if you are not doing well or get worse. °  °This information is not intended to replace advice given to you by your health care provider. Make sure you discuss any questions you have with your health care provider. °  °Document Released: 08/21/2005 Document Revised: 01/05/2015 Document Reviewed: 06/13/2013 °Elsevier Interactive Patient Education ©2016 Elsevier Inc. ° °

## 2016-02-09 NOTE — ED Notes (Signed)
Pt is from [place] and is currently in [place] taking care of sick mother, pt has been unable to see PCP due to his mother's sickness and needs metformin, insulin, amlodipine, and lyrica refilled.  Pt has no pain or any other complaints.

## 2017-06-10 ENCOUNTER — Encounter (HOSPITAL_COMMUNITY): Payer: Self-pay | Admitting: Emergency Medicine

## 2017-06-10 ENCOUNTER — Emergency Department (HOSPITAL_COMMUNITY): Payer: Medicaid Other

## 2017-06-10 ENCOUNTER — Inpatient Hospital Stay (HOSPITAL_COMMUNITY)
Admission: EM | Admit: 2017-06-10 | Discharge: 2017-06-13 | DRG: 637 | Disposition: A | Discharge status: Home / Self Care | Payer: Medicaid Other | Attending: Family Medicine | Admitting: Family Medicine

## 2017-06-10 DIAGNOSIS — I2510 Atherosclerotic heart disease of native coronary artery without angina pectoris: Secondary | ICD-10-CM | POA: Diagnosis present

## 2017-06-10 DIAGNOSIS — M549 Dorsalgia, unspecified: Secondary | ICD-10-CM | POA: Diagnosis present

## 2017-06-10 DIAGNOSIS — R600 Localized edema: Secondary | ICD-10-CM | POA: Diagnosis not present

## 2017-06-10 DIAGNOSIS — J181 Lobar pneumonia, unspecified organism: Secondary | ICD-10-CM

## 2017-06-10 DIAGNOSIS — G44209 Tension-type headache, unspecified, not intractable: Secondary | ICD-10-CM | POA: Diagnosis present

## 2017-06-10 DIAGNOSIS — J189 Pneumonia, unspecified organism: Secondary | ICD-10-CM

## 2017-06-10 DIAGNOSIS — E1165 Type 2 diabetes mellitus with hyperglycemia: Principal | ICD-10-CM | POA: Diagnosis present

## 2017-06-10 DIAGNOSIS — Z794 Long term (current) use of insulin: Secondary | ICD-10-CM

## 2017-06-10 DIAGNOSIS — R0602 Shortness of breath: Secondary | ICD-10-CM

## 2017-06-10 DIAGNOSIS — F17200 Nicotine dependence, unspecified, uncomplicated: Secondary | ICD-10-CM | POA: Diagnosis present

## 2017-06-10 DIAGNOSIS — Z79899 Other long term (current) drug therapy: Secondary | ICD-10-CM

## 2017-06-10 DIAGNOSIS — M810 Age-related osteoporosis without current pathological fracture: Secondary | ICD-10-CM | POA: Diagnosis present

## 2017-06-10 DIAGNOSIS — Z7982 Long term (current) use of aspirin: Secondary | ICD-10-CM

## 2017-06-10 DIAGNOSIS — R739 Hyperglycemia, unspecified: Secondary | ICD-10-CM | POA: Diagnosis not present

## 2017-06-10 DIAGNOSIS — E1140 Type 2 diabetes mellitus with diabetic neuropathy, unspecified: Secondary | ICD-10-CM | POA: Diagnosis present

## 2017-06-10 DIAGNOSIS — F419 Anxiety disorder, unspecified: Secondary | ICD-10-CM | POA: Diagnosis present

## 2017-06-10 DIAGNOSIS — I10 Essential (primary) hypertension: Secondary | ICD-10-CM | POA: Diagnosis present

## 2017-06-10 DIAGNOSIS — R0902 Hypoxemia: Secondary | ICD-10-CM | POA: Diagnosis present

## 2017-06-10 DIAGNOSIS — G8929 Other chronic pain: Secondary | ICD-10-CM | POA: Diagnosis present

## 2017-06-10 DIAGNOSIS — F209 Schizophrenia, unspecified: Secondary | ICD-10-CM | POA: Diagnosis present

## 2017-06-10 HISTORY — DX: Schizophrenia, unspecified: F20.9

## 2017-06-10 HISTORY — DX: Fibromyalgia: M79.7

## 2017-06-10 HISTORY — DX: Low back pain: M54.5

## 2017-06-10 HISTORY — DX: Depression, unspecified: F32.A

## 2017-06-10 HISTORY — DX: Unspecified osteoarthritis, unspecified site: M19.90

## 2017-06-10 HISTORY — DX: Anxiety disorder, unspecified: F41.9

## 2017-06-10 HISTORY — DX: Type 2 diabetes mellitus without complications: E11.9

## 2017-06-10 HISTORY — DX: Shortness of breath: R06.02

## 2017-06-10 HISTORY — DX: Other chronic pain: G89.29

## 2017-06-10 HISTORY — DX: Pneumonia, unspecified organism: J18.9

## 2017-06-10 HISTORY — DX: Gastro-esophageal reflux disease without esophagitis: K21.9

## 2017-06-10 HISTORY — DX: Pure hypercholesterolemia, unspecified: E78.00

## 2017-06-10 HISTORY — DX: Acute myocardial infarction, unspecified: I21.9

## 2017-06-10 HISTORY — DX: Major depressive disorder, single episode, unspecified: F32.9

## 2017-06-10 HISTORY — DX: Low back pain, unspecified: M54.50

## 2017-06-10 LAB — URINALYSIS, ROUTINE W REFLEX MICROSCOPIC
BACTERIA UA: NONE SEEN
BILIRUBIN URINE: NEGATIVE
Glucose, UA: >=500 mg/dL — AB
KETONES UR: NEGATIVE mg/dL
LEUKOCYTES UA: NEGATIVE
NITRITE: NEGATIVE
Protein, ur: NEGATIVE mg/dL
SPECIFIC GRAVITY, URINE: 1.028 (ref 1.005–1.030)
SQUAMOUS EPITHELIAL / LPF: NONE SEEN
WBC UA: NONE SEEN WBC/hpf (ref 0–5)
pH: 6 (ref 5.0–8.0)

## 2017-06-10 LAB — I-STAT VENOUS BLOOD GAS, ED
Acid-Base Excess: 1 mmol/L (ref 0.0–2.0)
Bicarbonate: 26.6 mmol/L (ref 20.0–28.0)
O2 SAT: 70 %
PCO2 VEN: 46.7 mmHg (ref 44.0–60.0)
PO2 VEN: 39 mmHg (ref 32.0–45.0)
TCO2: 28 mmol/L (ref 22–32)
pH, Ven: 7.365 (ref 7.250–7.430)

## 2017-06-10 LAB — I-STAT TROPONIN, ED: Troponin i, poc: 0 ng/mL (ref 0.00–0.08)

## 2017-06-10 LAB — CBC
HEMATOCRIT: 40.3 % (ref 39.0–52.0)
HEMOGLOBIN: 13.4 g/dL (ref 13.0–17.0)
MCH: 26.8 pg (ref 26.0–34.0)
MCHC: 33.3 g/dL (ref 30.0–36.0)
MCV: 80.6 fL (ref 78.0–100.0)
Platelets: 217 10*3/uL (ref 150–400)
RBC: 5 MIL/uL (ref 4.22–5.81)
RDW: 13.9 % (ref 11.5–15.5)
WBC: 9.1 10*3/uL (ref 4.0–10.5)

## 2017-06-10 LAB — TSH: TSH: 2.616 u[IU]/mL (ref 0.350–4.500)

## 2017-06-10 LAB — BASIC METABOLIC PANEL
ANION GAP: 7 (ref 5–15)
BUN: 12 mg/dL (ref 6–20)
CHLORIDE: 103 mmol/L (ref 101–111)
CO2: 25 mmol/L (ref 22–32)
Calcium: 8.8 mg/dL — ABNORMAL LOW (ref 8.9–10.3)
Creatinine, Ser: 1.18 mg/dL (ref 0.61–1.24)
GFR calc Af Amer: >60 mL/min (ref >60)
GLUCOSE: 344 mg/dL — AB (ref 65–99)
POTASSIUM: 4 mmol/L (ref 3.5–5.1)
Sodium: 135 mmol/L (ref 135–145)

## 2017-06-10 LAB — LIPID PANEL
CHOL/HDL RATIO: 3.9 ratio
Cholesterol: 85 mg/dL (ref 0–200)
HDL: 22 mg/dL — ABNORMAL LOW (ref >40)
LDL Cholesterol: 43 mg/dL (ref 0–99)
Triglycerides: 101 mg/dL (ref <150)
VLDL: 20 mg/dL (ref 0–40)

## 2017-06-10 LAB — CBG MONITORING, ED
GLUCOSE-CAPILLARY: 345 mg/dL — AB (ref 65–99)
GLUCOSE-CAPILLARY: 217 mg/dL — AB (ref 65–99)
GLUCOSE-CAPILLARY: 279 mg/dL — AB (ref 65–99)
Glucose-Capillary: 254 mg/dL — ABNORMAL HIGH (ref 65–99)
Glucose-Capillary: 218 mg/dL — ABNORMAL HIGH (ref 65–99)

## 2017-06-10 LAB — BRAIN NATRIURETIC PEPTIDE: B NATRIURETIC PEPTIDE 5: 35.8 pg/mL (ref 0.0–100.0)

## 2017-06-10 LAB — HEMOGLOBIN A1C
Hgb A1c MFr Bld: 9.5 % — ABNORMAL HIGH (ref 4.8–5.6)
MEAN PLASMA GLUCOSE: 225.95 mg/dL

## 2017-06-10 LAB — GLUCOSE, CAPILLARY: GLUCOSE-CAPILLARY: 423 mg/dL — AB (ref 65–99)

## 2017-06-10 MED ORDER — SODIUM CHLORIDE 0.9 % IV BOLUS (SEPSIS)
1000.0000 mL | Freq: Once | INTRAVENOUS | Status: AC
  Administered 2017-06-10: 1000 mL via INTRAVENOUS

## 2017-06-10 MED ORDER — DEXTROSE 5 % IV SOLN
1.0000 g | Freq: Once | INTRAVENOUS | Status: AC
  Administered 2017-06-10: 1 g via INTRAVENOUS
  Filled 2017-06-10: qty 10

## 2017-06-10 MED ORDER — AZITHROMYCIN 500 MG PO TABS
500.0000 mg | ORAL_TABLET | Freq: Every day | ORAL | Status: AC
  Administered 2017-06-11 – 2017-06-12 (×2): 500 mg via ORAL
  Filled 2017-06-10 (×2): qty 1

## 2017-06-10 MED ORDER — ASPIRIN EC 81 MG PO TBEC
81.0000 mg | DELAYED_RELEASE_TABLET | Freq: Every day | ORAL | Status: DC
  Administered 2017-06-10 – 2017-06-13 (×4): 81 mg via ORAL
  Filled 2017-06-10 (×4): qty 1

## 2017-06-10 MED ORDER — INSULIN ASPART 100 UNIT/ML ~~LOC~~ SOLN
0.0000 [IU] | Freq: Three times a day (TID) | SUBCUTANEOUS | Status: DC
Start: 2017-06-11 — End: 2017-06-13
  Administered 2017-06-10: 8 [IU] via SUBCUTANEOUS
  Administered 2017-06-11: 5 [IU] via SUBCUTANEOUS
  Administered 2017-06-11: 8 [IU] via SUBCUTANEOUS
  Administered 2017-06-11: 5 [IU] via SUBCUTANEOUS
  Administered 2017-06-12: 3 [IU] via SUBCUTANEOUS
  Administered 2017-06-12 (×2): 8 [IU] via SUBCUTANEOUS
  Administered 2017-06-13: 15 [IU] via SUBCUTANEOUS
  Administered 2017-06-13: 5 [IU] via SUBCUTANEOUS
  Filled 2017-06-10: qty 1

## 2017-06-10 MED ORDER — ACETAMINOPHEN 325 MG PO TABS
650.0000 mg | ORAL_TABLET | Freq: Four times a day (QID) | ORAL | Status: DC
  Administered 2017-06-10 – 2017-06-13 (×11): 650 mg via ORAL
  Filled 2017-06-10 (×12): qty 2

## 2017-06-10 MED ORDER — INSULIN ASPART 100 UNIT/ML ~~LOC~~ SOLN
5.0000 [IU] | Freq: Once | SUBCUTANEOUS | Status: AC
  Administered 2017-06-10: 5 [IU] via SUBCUTANEOUS

## 2017-06-10 MED ORDER — METOPROLOL TARTRATE 25 MG PO TABS
25.0000 mg | ORAL_TABLET | Freq: Two times a day (BID) | ORAL | Status: DC
  Administered 2017-06-10 – 2017-06-13 (×6): 25 mg via ORAL
  Filled 2017-06-10 (×6): qty 1

## 2017-06-10 MED ORDER — GABAPENTIN 300 MG PO CAPS
300.0000 mg | ORAL_CAPSULE | Freq: Two times a day (BID) | ORAL | Status: DC
  Administered 2017-06-10 – 2017-06-13 (×6): 300 mg via ORAL
  Filled 2017-06-10 (×6): qty 1

## 2017-06-10 MED ORDER — INSULIN ASPART 100 UNIT/ML ~~LOC~~ SOLN
10.0000 [IU] | Freq: Once | SUBCUTANEOUS | Status: AC
  Administered 2017-06-10: 10 [IU] via INTRAVENOUS
  Filled 2017-06-10: qty 1

## 2017-06-10 MED ORDER — ATORVASTATIN CALCIUM 40 MG PO TABS
40.0000 mg | ORAL_TABLET | Freq: Every day | ORAL | Status: DC
  Administered 2017-06-10 – 2017-06-13 (×4): 40 mg via ORAL
  Filled 2017-06-10 (×4): qty 1

## 2017-06-10 MED ORDER — DEXTROSE 5 % IV SOLN
500.0000 mg | Freq: Once | INTRAVENOUS | Status: AC
  Administered 2017-06-10: 500 mg via INTRAVENOUS
  Filled 2017-06-10: qty 500

## 2017-06-10 MED ORDER — OXYCODONE HCL 5 MG PO TABS
5.0000 mg | ORAL_TABLET | Freq: Four times a day (QID) | ORAL | Status: DC | PRN
  Administered 2017-06-11: 5 mg via ORAL
  Filled 2017-06-10: qty 1

## 2017-06-10 MED ORDER — HYDROCHLOROTHIAZIDE 25 MG PO TABS
25.0000 mg | ORAL_TABLET | Freq: Every day | ORAL | Status: DC
  Administered 2017-06-10 – 2017-06-13 (×4): 25 mg via ORAL
  Filled 2017-06-10 (×4): qty 1

## 2017-06-10 MED ORDER — LABETALOL HCL 5 MG/ML IV SOLN
10.0000 mg | INTRAVENOUS | Status: DC | PRN
  Filled 2017-06-10: qty 4

## 2017-06-10 MED ORDER — INSULIN GLARGINE 100 UNIT/ML ~~LOC~~ SOLN
15.0000 [IU] | Freq: Every day | SUBCUTANEOUS | Status: DC
  Administered 2017-06-10: 15 [IU] via SUBCUTANEOUS
  Filled 2017-06-10: qty 0.15

## 2017-06-10 MED ORDER — DIPHENHYDRAMINE HCL 50 MG/ML IJ SOLN
25.0000 mg | Freq: Once | INTRAMUSCULAR | Status: AC
  Administered 2017-06-10: 25 mg via INTRAVENOUS
  Filled 2017-06-10: qty 1

## 2017-06-10 MED ORDER — IPRATROPIUM-ALBUTEROL 0.5-2.5 (3) MG/3ML IN SOLN
3.0000 mL | Freq: Once | RESPIRATORY_TRACT | Status: AC
  Administered 2017-06-10: 3 mL via RESPIRATORY_TRACT
  Filled 2017-06-10: qty 3

## 2017-06-10 MED ORDER — INSULIN ASPART 100 UNIT/ML ~~LOC~~ SOLN
0.0000 [IU] | Freq: Every day | SUBCUTANEOUS | Status: DC
Start: 2017-06-10 — End: 2017-06-13
  Administered 2017-06-10: 5 [IU] via SUBCUTANEOUS
  Administered 2017-06-11: 2 [IU] via SUBCUTANEOUS
  Administered 2017-06-12: 3 [IU] via SUBCUTANEOUS

## 2017-06-10 MED ORDER — CEFTRIAXONE SODIUM 1 G IJ SOLR
1.0000 g | INTRAMUSCULAR | Status: DC
  Administered 2017-06-11 – 2017-06-12 (×2): 1 g via INTRAVENOUS
  Filled 2017-06-10 (×2): qty 10

## 2017-06-10 MED ORDER — ENOXAPARIN SODIUM 40 MG/0.4ML ~~LOC~~ SOLN
40.0000 mg | SUBCUTANEOUS | Status: DC
  Administered 2017-06-10 – 2017-06-12 (×3): 40 mg via SUBCUTANEOUS
  Filled 2017-06-10 (×3): qty 0.4

## 2017-06-10 MED ORDER — METOCLOPRAMIDE HCL 5 MG/ML IJ SOLN
10.0000 mg | Freq: Once | INTRAMUSCULAR | Status: AC
  Administered 2017-06-10: 10 mg via INTRAVENOUS
  Filled 2017-06-10: qty 2

## 2017-06-10 NOTE — ED Notes (Signed)
Pt given diet ginger ale and saltine crackers per Jessica(RN)

## 2017-06-10 NOTE — H&P (Signed)
Family Medicine Teaching [facility] Admission History and Physical Service Pager: [phone]  Patient name: Charles Orr Medical record number: [id number] Date of birth: [birth year] Age: 56 y.o. Gender: male  Primary Care Provider: Patient, No Pcp Per Consultants: None  Code Status: Full (obtained on admission)  Chief Complaint: Shortness of breath  Assessment and Plan: Charles Orr is a 56 y.o. male presenting with dyspnea and fatigue . PMH is significant for hypertension, diabetes, schizophrenia, anxiety, chronic back pain and ?Osteoporosis,  Dyspnea and fatigue: likely due to CAP. Patient has viral URI leading to his current symptoms. He also reports chills and fever. Chest x-ray with consolidation in RML and RLL. He has bilateral leg edema and bilateral clavicles on exam concerning for CHF. CXR also concerning for pulmonary edema vs poor penetration. Edema could be due to his Norvasc as well. He is not anemic. Unlikely PE with Wells score of 0. Initial EKG and troponin negative for ACS. However, patient has significant risk with HEART score 04. He also reports history of CAD about 10 years ago. However was not able to find this in Epic or care everywhere. He had echo at that time which was normal. He has already received ceftriaxone and azithromycin in ED. -Admit to MedSurg for observation. Attending Dr. Jennette Kettle -Cardiac monitor for 24 hour -Obtain BNP. Will consider diuresing if BNP is elevated -Rule out ACS with serial troponin and EKG -Risk stratification labs (A1c, lipid panel and TSH) -Monitor respiratory status. -History I and O's, and daily weights. -Manage pneumonia as below.  Community acquired pneumonia: CXR with consolidation in RML and RLL. Patient desatted to mid 54s with ambulation.  Status post ceftriaxone and azithromycin in ED. -Continue ceftriaxone and azithromycin  Headache: Likely tension headache based on description of his pain. Only red flag is nausea which could  also be due to pneumonia. He has no photophobia or phonophobia. Neuro exam within normal limits. -Tylenol as needed -Morphine as above until we rule out ACS.  Hypertension: Hypertensive to 165/91 on arrival to ED. Supposedly on amlodipine 10 mg and metoprolol but hasn't taken his medication in 5 days. He says he lost his medication back on the bus. -D/C amlodipine given bilateral edema -Continue metoprolol -We will consider adding hydrochlorothiazide  Hyperglycemia/Diabetes: CBG 344 on admission. He is not in DKA. Not sure about last A1c. He says he is on Novolin 70/30 65 units in the morning and 45 units in the evening. He says his lost his insulins about 5 days ago.  -Lantus 15 units nightly -SSI-moderate -Check A1c -CBG before meals and at bedtime -Carb modified and heart healthy diet  Schizophrenia/anxiety: Stable. Not sure what he is on. He says he used to be on Abilify and recently changed to another medication. Not sure about the name of his medication. No psychotic features. -Will continue to monitor  Back pain: Chronic. Stable. He states he is on Norco at home. -Oxycodone 5-10 mg every 6 hrs when necessary for severe pain -Tylenol 650 g every 6 hours  Neuropathic pain: On gabapentin 300 mg twice a day at home. -We will continue home meds  Social situation: He is on disability due to" mental and physical limitation".  -Consult social work and care management  FEN/GI:  -Carb modified and heart healthy diet  Prophylaxis: Lovenox  Disposition: admitted to MedSurg with cardiac monitor for treatment of pneumonia and further evaluation of dyspnea and fatigue  History of Present Illness:  Charles Orr is a 56  y.o. male with history of hypertension, diabetes, schizophrenia, anxiety, chronic back pain and ?Osteoporosis, who presents with with shortness of breath and fatigue  Patient is here from [place] visiting friends. He reports shortness of breath with exertion & fatigue for  3 days. He reports using his medication box on a bus about 5 days ago. He states he has not taken any of his medications since then. He denies chest pain or palpitation. He endorses global headache that he describes as pressure-like. He denies vision changes, slurred speech, facial drooping, focal numbness, tingling or weakness. He reports chills and tactile fever since this morning. He also reports cough with greenish sputum. He denies hemoptysis. He also reports dizziness that he describes as things are spinning around. He recalls recent runny nose and congestion about a week ago. He also reports nausea and abdominal pain. He denies emesis or diarrhea. He also noted bilateral leg swelling today. He denies orthopnea or paroxysmal nocturnal dyspnea. He denies history of blood clots. He reports history of heart attack and stent placement in 2008 here at [facility]. I could not find this in Epic or care everywhere. I see echocardiogram at that time that showed EF of 50% without significant abnormality. He denies recent hospitalization or antibiotic use. He says his PCP is at [facility] in [facility]. He says his last visit was about 4 months ago. He reports living in [place], [place]. He lives by himself. Reports smoking about half a pack a day since he was 56 years of age. Denies alcohol use or recreational drug use. He has no allergies to medication.  ED course: Vital signs significant for BP to 165/91. Per ED provider, he desaturated to mid 80s with ambulation. He was also tachypneic to low 20s to 30. ABG within normal limits. CBG elevated to 345 and trended down to 217. Bicarbonate and anion gap normal. Initial EKG and troponin negative. CBC with differential within normal limits. Chest x-ray with low lung volumes, elevated right hemidiaphragm and possible pneumonia in RLL and RML. UA negative except for glucose greater than 500 and a small hemoglobin. He was given, IV bolus 1,  DuoNeb 1 and Reglan 10,  ceftriaxone and azithromycin in ED. Family medicine is consulted due to desaturation to mid 80s with ambulation in the setting of pneumonia.  Review Of Systems:   Review of Systems  Constitutional: Positive for fever and malaise/fatigue. Negative for diaphoresis and weight loss.  HENT: Negative for sore throat.   Eyes: Negative for blurred vision, photophobia and pain.  Respiratory: Positive for cough, sputum production and shortness of breath. Negative for hemoptysis and wheezing.   Cardiovascular: Positive for leg swelling. Negative for chest pain, palpitations and orthopnea.  Gastrointestinal: Positive for abdominal pain and nausea. Negative for blood in stool, diarrhea and melena.  Genitourinary: Negative for dysuria, frequency and hematuria.  Musculoskeletal: Positive for back pain. Negative for myalgias.       Chronic back pain  Skin: Negative for rash.  Neurological: Positive for dizziness, weakness and headaches. Negative for sensory change, speech change and focal weakness.  Endo/Heme/Allergies: Does not bruise/bleed easily.  Psychiatric/Behavioral: Negative for depression, substance abuse and suicidal ideas. The patient is not nervous/anxious.    Past Medical History: Past Medical History:  Diagnosis Date  . Degenerative disc disease, lumbar   . Diabetes mellitus without complication (HCC)   . Hypertension     Past Surgical History: Past Surgical History:  Procedure Laterality Date  . CARDIAC  SURGERY    . HERNIA REPAIR    . PARATHYROIDECTOMY      Social History: Social History  Substance Use Topics  . Smoking status: Current Every Day Smoker  . Smokeless tobacco: Not on file  . Alcohol use No   Additional social history: as above Please also refer to relevant sections of EMR.  Family History: No family history on file. (If not completed, MUST add something in)  Allergies and Medications: No Known Allergies No current  facility-administered medications on file prior to encounter.    Current Outpatient Prescriptions on File Prior to Encounter  Medication Sig Dispense Refill  . amLODipine (NORVASC) 10 MG tablet Take 1 tablet (10 mg total) by mouth daily. 30 tablet 0  . insulin aspart protamine- aspart (NOVOLOG MIX 70/30) (70-30) 100 UNIT/ML injection Inject 0.2 mLs (20 Units total) into the skin 2 (two) times daily with a meal. Dispense needles, syringes QS for twice daily injections for one month (Patient taking differently: Inject 45-65 Units into the skin See admin instructions. Inject 65 units subcutaneously every morning and 45 units at night) 12 mL 0  . cyclobenzaprine (FLEXERIL) 10 MG tablet Take 1 tablet (10 mg total) by mouth 2 (two) times daily as needed for muscle spasms. (Patient not taking: Reported on 06/10/2017) 15 tablet 0  . gabapentin (NEURONTIN) 600 MG tablet Take 1 tablet (600 mg total) by mouth 2 (two) times daily. (Patient not taking: Reported on 06/10/2017) 60 tablet 0  . glipiZIDE (GLUCOTROL) 5 MG tablet Take 1 tablet (5 mg total) by mouth daily before breakfast. (Patient not taking: Reported on 06/10/2017) 30 tablet 0  . metFORMIN (GLUCOPHAGE) 1000 MG tablet Take 1 tablet (1,000 mg total) by mouth 2 (two) times daily with a meal. (Patient not taking: Reported on 06/10/2017) 60 tablet 0  . pregabalin (LYRICA) 50 MG capsule Take 1 capsule (50 mg total) by mouth 3 (three) times daily. (Patient not taking: Reported on 06/10/2017) 90 capsule 0    Objective: BP (!) 136/99   Pulse 79   Temp 99.2 F (37.3 C) (Rectal)   Resp 20   Ht  (1.88 m)   Wt 269 lb (122 kg)   SpO2 98%   BMI 34.54 kg/m  Exam: GEN: appears well, no apparent distress. Head: normocephalic and atraumatic  Eyes: conjunctiva with mild injection bilaterally, sclera anicteric, PERRL, EOMI, No nystagmus Nares: no rhinorrhea, congestion or erythema Oropharynx: mmm without erythema or exudation. Poor dentition HEM: negative  for cervical or periauricular lymphadenopathies CVS: RRR, nl s1 & s2, no murmurs, 2+ pitting edema bilaterally. RESP: no IWOB, good air movement bilaterally, bibasilar crackles bilaterally. No wheezes. GI: Difficult exam due to body habitus. BS present & normal, soft, NTND, no guarding, no rebound, no mass GU: no suprapubic or CVA tenderness MSK: Tenderness to palpation over paraspinal muscles in his back SKIN: no apparent skin lesion Skin: no rashes or neurocutaneous lesions Neuro: Awake, alert and oriented appropriately. Cranial nerves II-XII intact, motor 5/5 in all muscle groups of UE and LE bilaterally, normal tone, light sensation intact in all dermatomes of upper and lower ext bilaterally, no pronator drift, biceps and patellar reflexes 2+ bilaterally, finger to nose intact. PSYCH: euthymic mood with congruent affect. No psychosis  Labs and Imaging: CBC BMET   Recent Labs Lab 06/10/17 1245  WBC 9.1  HGB 13.4  HCT 40.3  PLT 217    Recent Labs Lab 06/10/17 1245  NA 135  K 4.0  CL  103  CO2 25  BUN 12  CREATININE 1.18  GLUCOSE 344*  CALCIUM 8.8*     Dg Chest 2 View  Result Date: 06/10/2017 CLINICAL DATA:  56 year old male diabetic with increasing urination and nausea. EXAM: CHEST  2 VIEW COMPARISON:  Chest x-ray 12/30/2006. FINDINGS: Lung volumes are low. Elevation of the right hemidiaphragm. Opacities in the right lung base may reflect areas of atelectasis and/or airspace consolidation. Left lung is well aerated. No pleural effusions. No evidence of pulmonary edema. Heart size is normal. Mediastinal contours are within normal limits. IMPRESSION: 1. Low lung volumes with elevation of the right hemidiaphragm and areas of atelectasis and/or airspace consolidation in the right lower lobe and right middle lobe. Electronically Signed   By: Trudie Reed M.D.   On: 06/10/2017 13:15    Almon Hercules, MD 06/10/2017, 5:59 PM PGY-3, [facility] FPTS Intern pager:  [phone], text pages welcome

## 2017-06-10 NOTE — ED Notes (Signed)
Attempted report x1, stated RNs still in shift change and to leave #. Informed them that policy is that since pt had bed for more than 40 mins @ time of call, this RN would have to come up and do bedside report due to pt having bed for more than 45 mins.

## 2017-06-10 NOTE — ED Triage Notes (Signed)
Patient presents to the ED, reports he was traveling on the bus and was on his way back home to [place] and lost his insulin. Patient reports insulin has been missing since Wednesday. Patient reports he has had increase urination and nausea denies any vomiting. Patient alert and oriented x. Patient reports he has not take any of his other medications as well. Patient denies any pain on arrival. CBG with EMS 448.

## 2017-06-10 NOTE — ED Notes (Signed)
Pt ambulated with the pulse oximetry. Pt's O2 dropped as low as 87% on ambulation.

## 2017-06-10 NOTE — ED Notes (Signed)
Pt CBG was 345, notified Janee(RN)

## 2017-06-10 NOTE — Clinical Social Work Note (Signed)
Clinical Social Work Assessment  Patient Details  Name: Charles Orr MRN: [id number] Date of Birth: 09/27/1960  Date of referral:  06/10/17               Reason for consult:  Housing Concerns/Homelessness                Permission sought to share information with:    Permission granted to share information::  No  Name::        Agency::     Relationship::     Contact Information:     Housing/Transportation Living arrangements for the past 2 months:  Single Family Home Source of Information:  Patient Patient Interpreter Needed:  None Criminal Activity/Legal Involvement Pertinent to Current Situation/Hospitalization:    Significant Relationships:  Siblings, Parents Lives with:  Self Do you feel safe going back to the place where you live?  Yes Need for family participation in patient care:  No (Coment)  Care giving concerns:  Patient lost his wallet, currently has no identification, insurance cards, or prescribed medications. Patient presented as alert and oriented times four, however provided CSW with various versions of how he got to [place]. CSW unsure if patient is confused due to lack of diabetes and high blood pressure medication.   Social Worker assessment / plan:  CSW met with patient who presents at [facility] ED for being diabetic without insulin. Patient reported being without his insulin since Wednesday, that he was traveling from [place] to [place], [place] to be with his family. Patient stated that he has stable housing in [place] where he lives alone. Patient interested in relocating to [place] to be closer to family. CSW informed patient that resource information was available for [place] but nothing for [place]. Patient in agreement and receptive to CSW interaction and guidance. CSW provided patient with shelter list, behavioral health providers, and nutritional resources to address immediate needs.  Employment status:  Disabled (Comment on whether or not currently  receiving Disability) (Patient does receive SSI) Insurance information:  Medicaid In State PT Recommendations:  Not assessed at this time Information / Referral to community resources:     Patient/Family's Response to care:  Patient presented alone to ED, stated he was on the way to see his family when he began feeling bad. No family present during CSW encounter.  Patient/Family's Understanding of and Emotional Response to Diagnosis, Current Treatment, and Prognosis:  Patient understanding of importance of medications for his diabetes and hypertension.  Emotional Assessment Appearance:  Appears stated age Attitude/Demeanor/Rapport:    Affect (typically observed):  Accepting, Calm Orientation:  Oriented to Self, Oriented to Place, Oriented to  Time, Oriented to Situation Alcohol / Substance use:  Not Applicable Psych involvement (Current and /or in the community):  No (Comment)  Discharge Needs  Concerns to be addressed:  Homelessness Readmission within the last 30 days:  No Current discharge risk:  Inadequate Financial Supports Barriers to Discharge:  Homeless with medical needs    L , LCSW 06/10/2017, 3:51 PM  

## 2017-06-10 NOTE — ED Provider Notes (Signed)
[facility]-EMERGENCY DEPT Provider Note   CSN: [id number] Arrival date & time: 06/10/17  1200     History   Chief Complaint Chief Complaint  Patient presents with  . Hyperglycemia    HPI Charles Orr is a 56 y.o. male.  HPI   56 year old male with history of hypertension, diabetes, who presents with multiple complaints. The patient states that over the last week, he has had cough and congestion. He is currently traveling back to his home in [place], but is currently in [place] temporarily. He ran out of his insulin and has not taken any of his medications. He states that over the last several days, has had progressively worsening weakness, cough, and generalized fatigue. He has had dizziness with standing as well as with exertion. Denies chest pain. He feels like he's had fevers and chills. Denies any recent hospitalizations. Denies any recent antibiotic use.  Past Medical History:  Diagnosis Date  . Degenerative disc disease, lumbar   . Diabetes mellitus without complication (HCC)   . Hypertension     Patient Active Problem List   Diagnosis Date Noted  . Shortness of breath 06/10/2017    Past Surgical History:  Procedure Laterality Date  . CARDIAC SURGERY    . HERNIA REPAIR    . PARATHYROIDECTOMY         Home Medications    Prior to Admission medications   Medication Sig Start Date End Date Taking? Authorizing Provider  amLODipine (NORVASC) 10 MG tablet Take 1 tablet (10 mg total) by mouth daily. 02/09/16  Yes Everlene Farrier, PA-C  aspirin EC 81 MG tablet Take 81 mg by mouth daily.   Yes [provider]  gabapentin (NEURONTIN) 300 MG capsule Take 300 mg by mouth 2 (two) times daily.   Yes [provider]  insulin aspart protamine- aspart (NOVOLOG MIX 70/30) (70-30) 100 UNIT/ML injection Inject 0.2 mLs (20 Units total) into the skin 2 (two) times daily with a meal. Dispense needles, syringes QS for twice daily injections for one month Patient taking  differently: Inject 45-65 Units into the skin See admin instructions. Inject 65 units subcutaneously every morning and 45 units at night 02/09/16  Yes Everlene Farrier, PA-C  Metoprolol Tartrate (LOPRESSOR PO) Take 1 tablet by mouth 2 (two) times daily.   Yes [provider]  Multiple Vitamin (MULTIVITAMIN WITH MINERALS) TABS tablet Take 1 tablet by mouth daily.   Yes [provider]  cyclobenzaprine (FLEXERIL) 10 MG tablet Take 1 tablet (10 mg total) by mouth 2 (two) times daily as needed for muscle spasms. Patient not taking: Reported on 06/10/2017 11/04/15   Zadie Rhine, MD  gabapentin (NEURONTIN) 600 MG tablet Take 1 tablet (600 mg total) by mouth 2 (two) times daily. Patient not taking: Reported on 06/10/2017 09/02/15   Gilda Crease, MD  glipiZIDE (GLUCOTROL) 5 MG tablet Take 1 tablet (5 mg total) by mouth daily before breakfast. Patient not taking: Reported on 06/10/2017 09/02/15   Gilda Crease, MD  metFORMIN (GLUCOPHAGE) 1000 MG tablet Take 1 tablet (1,000 mg total) by mouth 2 (two) times daily with a meal. Patient not taking: Reported on 06/10/2017 02/09/16   Everlene Farrier, PA-C  pregabalin (LYRICA) 50 MG capsule Take 1 capsule (50 mg total) by mouth 3 (three) times daily. Patient not taking: Reported on 06/10/2017 02/09/16   Everlene Farrier, PA-C    Family History No family history on file.  Social History Social History  Substance Use Topics  . Smoking  status: Current Every Day Smoker  . Smokeless tobacco: Not on file  . Alcohol use No     Allergies   Patient has no known allergies.   Review of Systems Review of Systems  Constitutional: Positive for fatigue.  HENT: Positive for congestion and rhinorrhea.   Respiratory: Positive for cough and shortness of breath.   Endocrine: Positive for polyuria.  Neurological: Positive for weakness.  All other systems reviewed and are negative.    Physical Exam Updated Vital Signs BP (!) 159/105    Pulse 92   Temp 99.2 F (37.3 C) (Rectal)   Resp (!) 24   Ht  (1.88 m)   Wt 122 kg (269 lb)   SpO2 95%   BMI 34.54 kg/m   Physical Exam  Constitutional: He is oriented to person, place, and time. He appears well-developed and well-nourished. No distress.  HENT:  Head: Normocephalic and atraumatic.  Dry MM  Eyes: Conjunctivae are normal.  Neck: Neck supple.  Cardiovascular: Normal rate, regular rhythm and normal heart sounds.  Exam reveals no friction rub.   No murmur heard. Pulmonary/Chest: Effort normal. Tachypnea noted. No respiratory distress. He has no wheezes. He has rhonchi in the right middle field, the right lower field and the left lower field. He has no rales.  Abdominal: He exhibits no distension.  Musculoskeletal: He exhibits no edema.  Neurological: He is alert and oriented to person, place, and time. He exhibits normal muscle tone.  Skin: Skin is warm. Capillary refill takes less than 2 seconds.  Psychiatric: He has a normal mood and affect.  Nursing note and vitals reviewed.    ED Treatments / Results  Labs (all labs ordered are listed, but only abnormal results are displayed) Labs Reviewed  BASIC METABOLIC PANEL - Abnormal; Notable for the following:       Result Value   Glucose, Bld 344 (*)    Calcium 8.8 (*)    All other components within normal limits  URINALYSIS, ROUTINE W REFLEX MICROSCOPIC - Abnormal; Notable for the following:    Glucose, UA >=500 (*)    Hgb urine dipstick SMALL (*)    All other components within normal limits  CBG MONITORING, ED - Abnormal; Notable for the following:    Glucose-Capillary 345 (*)    All other components within normal limits  CBG MONITORING, ED - Abnormal; Notable for the following:    Glucose-Capillary 254 (*)    All other components within normal limits  CBG MONITORING, ED - Abnormal; Notable for the following:    Glucose-Capillary 218 (*)    All other components within normal limits  CBG MONITORING,  ED - Abnormal; Notable for the following:    Glucose-Capillary 217 (*)    All other components within normal limits  CBG MONITORING, ED - Abnormal; Notable for the following:    Glucose-Capillary 279 (*)    All other components within normal limits  CBC  HIV ANTIBODY (ROUTINE TESTING)  CBC  CREATININE, SERUM  TSH  COMPREHENSIVE METABOLIC PANEL  HEMOGLOBIN A1C  LIPID PANEL  BRAIN NATRIURETIC PEPTIDE  I-STAT VENOUS BLOOD GAS, ED  I-STAT TROPONIN, ED    EKG  EKG Interpretation  Date/Time:  Sunday June 10 2017 12:10:06 EDT Ventricular Rate:  82 PR Interval:    QRS Duration: 97 QT Interval:  395 QTC Calculation: 462 R Axis:   59 Text Interpretation:  Sinus rhythm Probable left atrial enlargement No significant change since last tracing Confirmed by Shaune Pollack (  [id number]) on 06/10/2017 1:08:01 PM       Radiology Dg Chest 2 View  Result Date: 06/10/2017 CLINICAL DATA:  56 year old male diabetic with increasing urination and nausea. EXAM: CHEST  2 VIEW COMPARISON:  Chest x-ray 12/30/2006. FINDINGS: Lung volumes are low. Elevation of the right hemidiaphragm. Opacities in the right lung base may reflect areas of atelectasis and/or airspace consolidation. Left lung is well aerated. No pleural effusions. No evidence of pulmonary edema. Heart size is normal. Mediastinal contours are within normal limits. IMPRESSION: 1. Low lung volumes with elevation of the right hemidiaphragm and areas of atelectasis and/or airspace consolidation in the right lower lobe and right middle lobe. Electronically Signed   By: Trudie Reed M.D.   On: 06/10/2017 13:15    Procedures Procedures (including critical care time)  Medications Ordered in ED Medications  aspirin EC tablet 81 mg (not administered)  gabapentin (NEURONTIN) capsule 300 mg (not administered)  metoprolol tartrate (LOPRESSOR) tablet 25 mg (not administered)  enoxaparin (LOVENOX) injection 40 mg (not administered)  insulin aspart  (novoLOG) injection 0-15 Units (8 Units Subcutaneous Given 06/10/17 1900)  insulin aspart (novoLOG) injection 0-5 Units (not administered)  insulin glargine (LANTUS) injection 15 Units (not administered)  atorvastatin (LIPITOR) tablet 40 mg (not administered)  cefTRIAXone (ROCEPHIN) 1 g in dextrose 5 % 50 mL IVPB (not administered)  azithromycin (ZITHROMAX) tablet 500 mg (not administered)  hydrochlorothiazide (HYDRODIURIL) tablet 25 mg (25 mg Oral Given 06/10/17 2042)  labetalol (NORMODYNE,TRANDATE) injection 10 mg (not administered)  oxyCODONE (Oxy IR/ROXICODONE) immediate release tablet 5-10 mg (not administered)  acetaminophen (TYLENOL) tablet 650 mg (not administered)  sodium chloride 0.9 % bolus 1,000 mL (0 mLs Intravenous Stopped 06/10/17 1401)  metoCLOPramide (REGLAN) injection 10 mg (10 mg Intravenous Given 06/10/17 1248)  diphenhydrAMINE (BENADRYL) injection 25 mg (25 mg Intravenous Given 06/10/17 1249)  cefTRIAXone (ROCEPHIN) 1 g in dextrose 5 % 50 mL IVPB (0 g Intravenous Stopped 06/10/17 1741)  azithromycin (ZITHROMAX) 500 mg in dextrose 5 % 250 mL IVPB (0 mg Intravenous Stopped 06/10/17 1555)  insulin aspart (novoLOG) injection 10 Units (10 Units Intravenous Given 06/10/17 1400)  ipratropium-albuterol (DUONEB) 0.5-2.5 (3) MG/3ML nebulizer solution 3 mL (3 mLs Nebulization Given 06/10/17 1515)     Initial Impression / Assessment and Plan / ED Course  I have reviewed the triage vital signs and the nursing notes.  Pertinent labs & imaging results that were available during my care of the patient were reviewed by me and considered in my medical decision making (see chart for details).     56 year old male with past medical history as above here with generalized weakness and hyperglycemia in the setting of running out of his medications. On exam, he does have rhonchi bilaterally and chest x-ray is concerning for possible right-sided pneumonia. He has a normal white blood cell count but is  hypoxic with ambulation the ED. He is also hyperglycemic. He is also tachypnea to the 30s to 40s intermittently. Concern for respiratory distress 2/2 CAP. Given hypoxia with ambulation and tachypnea, will admit.   Final Clinical Impressions(s) / ED Diagnoses   Final diagnoses:  Community acquired pneumonia of right lower lobe of lung (HCC)  Hyperglycemia    New Prescriptions Current Discharge Medication List       Shaune Pollack, MD 06/10/17 2105

## 2017-06-11 ENCOUNTER — Observation Stay (HOSPITAL_COMMUNITY): Payer: Medicaid Other

## 2017-06-11 DIAGNOSIS — F17200 Nicotine dependence, unspecified, uncomplicated: Secondary | ICD-10-CM | POA: Diagnosis present

## 2017-06-11 DIAGNOSIS — J181 Lobar pneumonia, unspecified organism: Secondary | ICD-10-CM | POA: Diagnosis not present

## 2017-06-11 DIAGNOSIS — I2510 Atherosclerotic heart disease of native coronary artery without angina pectoris: Secondary | ICD-10-CM | POA: Diagnosis present

## 2017-06-11 DIAGNOSIS — F209 Schizophrenia, unspecified: Secondary | ICD-10-CM | POA: Diagnosis present

## 2017-06-11 DIAGNOSIS — R739 Hyperglycemia, unspecified: Secondary | ICD-10-CM

## 2017-06-11 DIAGNOSIS — G8929 Other chronic pain: Secondary | ICD-10-CM | POA: Diagnosis present

## 2017-06-11 DIAGNOSIS — M549 Dorsalgia, unspecified: Secondary | ICD-10-CM | POA: Diagnosis present

## 2017-06-11 DIAGNOSIS — Z7982 Long term (current) use of aspirin: Secondary | ICD-10-CM | POA: Diagnosis not present

## 2017-06-11 DIAGNOSIS — R0902 Hypoxemia: Secondary | ICD-10-CM | POA: Diagnosis present

## 2017-06-11 DIAGNOSIS — Z794 Long term (current) use of insulin: Secondary | ICD-10-CM | POA: Diagnosis not present

## 2017-06-11 DIAGNOSIS — F419 Anxiety disorder, unspecified: Secondary | ICD-10-CM | POA: Diagnosis present

## 2017-06-11 DIAGNOSIS — E1140 Type 2 diabetes mellitus with diabetic neuropathy, unspecified: Secondary | ICD-10-CM | POA: Diagnosis present

## 2017-06-11 DIAGNOSIS — I10 Essential (primary) hypertension: Secondary | ICD-10-CM | POA: Diagnosis present

## 2017-06-11 DIAGNOSIS — F2089 Other schizophrenia: Secondary | ICD-10-CM | POA: Diagnosis not present

## 2017-06-11 DIAGNOSIS — J189 Pneumonia, unspecified organism: Secondary | ICD-10-CM | POA: Diagnosis present

## 2017-06-11 DIAGNOSIS — E1165 Type 2 diabetes mellitus with hyperglycemia: Secondary | ICD-10-CM | POA: Diagnosis present

## 2017-06-11 DIAGNOSIS — M810 Age-related osteoporosis without current pathological fracture: Secondary | ICD-10-CM | POA: Diagnosis present

## 2017-06-11 DIAGNOSIS — Z79899 Other long term (current) drug therapy: Secondary | ICD-10-CM | POA: Diagnosis not present

## 2017-06-11 DIAGNOSIS — G44209 Tension-type headache, unspecified, not intractable: Secondary | ICD-10-CM | POA: Diagnosis present

## 2017-06-11 DIAGNOSIS — R600 Localized edema: Secondary | ICD-10-CM

## 2017-06-11 LAB — COMPREHENSIVE METABOLIC PANEL
ALBUMIN: 3.4 g/dL — AB (ref 3.5–5.0)
ALK PHOS: 72 U/L (ref 38–126)
ALT: 18 U/L (ref 17–63)
ANION GAP: 8 (ref 5–15)
AST: 17 U/L (ref 15–41)
BUN: 10 mg/dL (ref 6–20)
CHLORIDE: 104 mmol/L (ref 101–111)
CO2: 25 mmol/L (ref 22–32)
Calcium: 8.7 mg/dL — ABNORMAL LOW (ref 8.9–10.3)
Creatinine, Ser: 1.1 mg/dL (ref 0.61–1.24)
GFR calc Af Amer: >60 mL/min (ref >60)
GFR calc non Af Amer: >60 mL/min (ref >60)
GLUCOSE: 220 mg/dL — AB (ref 65–99)
POTASSIUM: 3.8 mmol/L (ref 3.5–5.1)
SODIUM: 137 mmol/L (ref 135–145)
Total Bilirubin: 0.6 mg/dL (ref 0.3–1.2)
Total Protein: 6.1 g/dL — ABNORMAL LOW (ref 6.5–8.1)

## 2017-06-11 LAB — TROPONIN I

## 2017-06-11 LAB — HIV ANTIBODY (ROUTINE TESTING W REFLEX): HIV SCREEN 4TH GENERATION: NONREACTIVE

## 2017-06-11 LAB — GLUCOSE, CAPILLARY
GLUCOSE-CAPILLARY: 220 mg/dL — AB (ref 65–99)
GLUCOSE-CAPILLARY: 209 mg/dL — AB (ref 65–99)
Glucose-Capillary: 269 mg/dL — ABNORMAL HIGH (ref 65–99)
Glucose-Capillary: 244 mg/dL — ABNORMAL HIGH (ref 65–99)

## 2017-06-11 MED ORDER — INSULIN GLARGINE 100 UNIT/ML ~~LOC~~ SOLN
20.0000 [IU] | Freq: Every day | SUBCUTANEOUS | Status: DC
  Administered 2017-06-11 – 2017-06-12 (×2): 20 [IU] via SUBCUTANEOUS
  Filled 2017-06-11 (×3): qty 0.2

## 2017-06-11 NOTE — Progress Notes (Signed)
Family Medicine Teaching Service Daily Progress Note Intern Pager: [phone]  Patient name: Charles Orr Medical record number: [id number] Date of birth: 06-04-1961 Age: 56 y.o. Gender: male  Primary Care Provider: Patient, No Pcp Per Consultants:  Code Status: Full  Pt Overview and Major Events to Date:  Charles Orr is a 56 y.o. male presenting with dyspnea and fatigue . PMH is significant for hypertension, diabetes, schizophrenia, anxiety, chronic back pain and ?Osteoporosis,  Assessment and Plan: Charles Orr is a 56 y.o. male presenting with dyspnea and fatigue . PMH is significant for hypertension, diabetes, schizophrenia, anxiety, chronic back pain and ?Osteoporosis,  Dyspnea and fatigue: likely due to CAP. Patient has viral URI leading to his current symptoms. He also reports chills and fever. Chest x-ray with consolidation in RML and RLL. He has bilateral leg edema and bilateral clavicles on exam concerning for CHF. CXR also concerning for pulmonary edema vs poor penetration. Edema could be due to his Norvasc as well. He is not anemic. Unlikely PE with Wells score of 0. Initial EKG and troponin negative for ACS. However, patient has significant risk with HEART score 04. He also reports history of CAD about 10 years ago. However was not able to find this in Epic or care everywhere. He had echo at that time which was normal. He has already received ceftriaxone and azithromycin in ED. -Admit to MedSurg for observation. Attending Dr. Jennette Kettle -Cardiac monitor for 24 hour -Obtain BNP. Will consider diuresing if BNP is elevated -Rule out ACS with serial troponin and EKG -Risk stratification labs (A1c, lipid panel and TSH) -Monitor respiratory status. -History I and O's, and daily weights. -Manage pneumonia as below.  Community acquired pneumonia: CXR with consolidation in RML and RLL. Patient desatted to mid 72s with ambulation.  Status post ceftriaxone and azithromycin in ED. -Continue  ceftriaxone and azithromycin  Headache: Likely tension headache based on description of his pain. Only red flag is nausea which could also be due to pneumonia. He has no photophobia or phonophobia. Neuro exam within normal limits. -Tylenol as needed -Morphine as above until we rule out ACS.  Hypertension: Hypertensive to 165/91 on arrival to ED. Supposedly on amlodipine 10 mg and metoprolol but hasn't taken his medication in 5 days. He says he lost his medication back on the bus. -D/C amlodipine given bilateral edema -Continue metoprolol -We will consider adding hydrochlorothiazide  Hyperglycemia/Diabetes: CBG 344 on admission. He is not in DKA. Not sure about last A1c. He says he is on Novolin 70/30 65 units in the morning and 45 units in the evening. He says his lost his insulins about 5 days ago.  -Lantus 15 units nightly -SSI-moderate -Check A1c -CBG before meals and at bedtime -Carb modified and heart healthy diet  Schizophrenia/anxiety: Stable. Not sure what he is on. He says he used to be on Abilify and recently changed to another medication. Not sure about the name of his medication.  -hearing voices, they are discouraging but not ordering him to do anything -has passing thoughts of self -harm but denies that he will act on them as he "knows he shouldn't", no plan, has been hospitalized for selfharm in the past but never harmed others -Will continue to monitor -  Back pain: Chronic. Stable. He states he is on Norco at home. -Oxycodone 5-10 mg every 6 hrs when necessary for severe pain -Tylenol 650 g every 6 hours  Neuropathic pain: On gabapentin 300 mg twice a day at home. -  We will continue home meds  Social situation: He is on disability due to" mental and physical limitation".  -Consult social work and care management  FEN/GI:  -Carb modified and heart healthy diet  Prophylaxis: Lovenox  Disposition: admitted to MedSurg with cardiac monitor for treatment of  pneumonia and further evaluation of dyspnea and fatigue  Subjective:  Thinks his breathing has not improved but he does feel a little better, is very concerned with getting his items back from the bus line  Objective: Temp:  [98.6 F (37 C)-99.2 F (37.3 C)] 98.6 F (37 C) (10/07 2108) Pulse Rate:  [67-92] 70 (10/08 0848) Resp:  [11-30] 18 (10/08 0541) BP: (111-190)/(60-105) 114/75 (10/08 0848) SpO2:  [95 %-100 %] 99 % (10/08 0541) Weight:  [268 lb 6.4 oz (121.7 kg)] 268 lb 6.4 oz (121.7 kg) (10/08 0541) Physical Exam: GEN: appears well, no apparent distress. Head: normocephalic and atraumatic  Eyes: conjunctiva with mild injection bilaterally, sclera anicteric, PERRL, EOMI, No nystagmus Nares: no rhinorrhea, congestion or erythema CVS: RRR, nl s1 & s2, no murmurs RESP: no IWOB, good air movement bilaterally, bibasilar crackles bilaterally. No wheezes. GI: Difficult exam due to body habitus. BS present & normal, soft, NTND, no guarding, no rebound, no mass SKIN: no apparent skin lesion Neuro: no deficits noted grossly PSYCH: euthymic mood, endorses hearing discouraging voices but he knows they aren't real and they aren't telling him to do anything.  Admits he has passively had thoughts of self-harm lately but knows that's "not going to happen" and has no plan.   He admits past hospitalization for self-harm ideation but denies ever trying to harm others  Laboratory:  Recent Labs Lab 06/10/17 1245  WBC 9.1  HGB 13.4  HCT 40.3  PLT 217    Recent Labs Lab 06/10/17 1245 06/11/17 0428  NA 135 137  K 4.0 3.8  CL 103 104  CO2 25 25  BUN 12 10  CREATININE 1.18 1.10  CALCIUM 8.8* 8.7*  PROT  --  6.1*  BILITOT  --  0.6  ALKPHOS  --  72  ALT  --  18  AST  --  17  GLUCOSE 344* 220*      Imaging/Diagnostic Tests: Dg Chest 2 View  Result Date: 06/10/2017 CLINICAL DATA:  56 year old male diabetic with increasing urination and nausea. EXAM: CHEST  2 VIEW COMPARISON:   Chest x-ray 12/30/2006. FINDINGS: Lung volumes are low. Elevation of the right hemidiaphragm. Opacities in the right lung base may reflect areas of atelectasis and/or airspace consolidation. Left lung is well aerated. No pleural effusions. No evidence of pulmonary edema. Heart size is normal. Mediastinal contours are within normal limits. IMPRESSION: 1. Low lung volumes with elevation of the right hemidiaphragm and areas of atelectasis and/or airspace consolidation in the right lower lobe and right middle lobe. Electronically Signed   By: Trudie Reed M.D.   On: 06/10/2017 13:15     Marthenia Rolling, DO 06/11/2017, 1:57 PM PGY-1, [facility] Family Medicine FPTS Intern pager: [phone], text pages welcome

## 2017-06-11 NOTE — Progress Notes (Signed)
Inpatient Diabetes Program Recommendations  AACE/ADA: New Consensus Statement on Inpatient Glycemic Control (2015)  Target Ranges:  Prepandial:   less than 140 mg/dL      Peak postprandial:   less than 180 mg/dL (1-2 hours)      Critically ill patients:  140 - 180 mg/dL   Lab Results  Component Value Date   GLUCAP 220 (H) 06/11/2017   HGBA1C 9.5 (H) 06/10/2017    Review of Glycemic ControlResults for [name], Charles Orr (MRN [id number]) as of 06/11/2017 12:57  Ref. Range 06/10/2017 15:11 06/10/2017 17:20 06/10/2017 18:52 06/10/2017 21:01 06/11/2017 08:11  Glucose-Capillary Latest Ref Range: 65 - 99 mg/dL 409 (H) 811 (H) 914 (H) 423 (H) 220 (H)   Diabetes history: Type 2 diabetes Outpatient Diabetes medications: Novolog 70/30 mix 65 units q AM and 45 units q PM Current orders for Inpatient glycemic control:  Novolog moderate tid with meals and HS, Lantus 20 units q HS  Inpatient Diabetes Program Recommendations:   Based on patient's home dose of insulin, consider increasing Lantus to 40 units q HS and add Novolog meal coverage 8 units tid with meals (hold if patient eats less than 50%).    Thanks, Beryl Meager, RN, BC-ADM Inpatient Diabetes Coordinator Pager [phone]

## 2017-06-11 NOTE — Progress Notes (Signed)
CM received consult : Patient without any personal effects as he said bus line has them, was living in [place] and got stuck here on way to [place] for family. CSW is aware. Gae Gallop RN,BSN,CM

## 2017-06-12 ENCOUNTER — Encounter (HOSPITAL_COMMUNITY): Payer: Self-pay | Admitting: General Practice

## 2017-06-12 DIAGNOSIS — F2089 Other schizophrenia: Secondary | ICD-10-CM

## 2017-06-12 LAB — GLUCOSE, CAPILLARY
GLUCOSE-CAPILLARY: 200 mg/dL — AB (ref 65–99)
GLUCOSE-CAPILLARY: 284 mg/dL — AB (ref 65–99)
GLUCOSE-CAPILLARY: 282 mg/dL — AB (ref 65–99)
Glucose-Capillary: 274 mg/dL — ABNORMAL HIGH (ref 65–99)

## 2017-06-12 LAB — BASIC METABOLIC PANEL
ANION GAP: 11 (ref 5–15)
BUN: 9 mg/dL (ref 6–20)
CHLORIDE: 102 mmol/L (ref 101–111)
CO2: 23 mmol/L (ref 22–32)
Calcium: 9.4 mg/dL (ref 8.9–10.3)
Creatinine, Ser: 1.09 mg/dL (ref 0.61–1.24)
GFR calc non Af Amer: >60 mL/min (ref >60)
Glucose, Bld: 211 mg/dL — ABNORMAL HIGH (ref 65–99)
POTASSIUM: 3.8 mmol/L (ref 3.5–5.1)
SODIUM: 136 mmol/L (ref 135–145)

## 2017-06-12 LAB — CBC
HEMATOCRIT: 41.3 % (ref 39.0–52.0)
HEMOGLOBIN: 13.9 g/dL (ref 13.0–17.0)
MCH: 26.9 pg (ref 26.0–34.0)
MCHC: 33.7 g/dL (ref 30.0–36.0)
MCV: 79.9 fL (ref 78.0–100.0)
Platelets: 227 10*3/uL (ref 150–400)
RBC: 5.17 MIL/uL (ref 4.22–5.81)
RDW: 13.4 % (ref 11.5–15.5)
WBC: 8.4 10*3/uL (ref 4.0–10.5)

## 2017-06-12 MED ORDER — LEVOFLOXACIN 750 MG PO TABS
750.0000 mg | ORAL_TABLET | Freq: Every day | ORAL | Status: DC
  Administered 2017-06-12 – 2017-06-13 (×2): 750 mg via ORAL
  Filled 2017-06-12 (×2): qty 1

## 2017-06-12 MED ORDER — ARIPIPRAZOLE 10 MG PO TABS
10.0000 mg | ORAL_TABLET | Freq: Every day | ORAL | Status: DC
  Administered 2017-06-12 – 2017-06-13 (×2): 10 mg via ORAL
  Filled 2017-06-12 (×2): qty 1

## 2017-06-12 NOTE — Progress Notes (Addendum)
CM called [facility] @ [phone] which pt has used in the past and obtained pt's medicaid #, [id number] L.  CM to share information with pt. [facility] rep. stated pt can get medication from pharmacies by presenting medicaid #  without ID as long as it is not a controlled substance.  Regarding pt's transportation once released from the hospital, CM shared with pt not sure if hospital can provide assistance with travel to [place]. CM reached out/ made referral to CSW and CSW stated will discuss case with director and f/u with pt and CM. Gae Gallop RN,BSN,CM

## 2017-06-12 NOTE — Discharge Summary (Signed)
Family Medicine Teaching [facility] Discharge Summary  Patient name: Charles Orr Medical record number: [id number] Date of birth: 1961-02-21 Age: 56 y.o. Gender: male Date of Admission: 06/10/2017  Date of Discharge: 06/13/17 Admitting Physician: Nestor Ramp, MD  Primary Care Provider: Patient, No Pcp Per Consultants:   Indication for Hospitalization: hyperglycemia, potential pneumonia  Discharge Diagnoses/Problem List:  DM2, pneumonia, schizophrenia  Disposition: to community, SW assisted with train ticket  Discharge Condition: stable  Discharge Exam: GEN: appears well, no apparent distress. Head: normocephalic and atraumatic  Nares: no rhinorrhea, congestion or erythema CVS: RRR, nl s1 &s2, no murmurs RESP: no IWOB, good air movement bilaterally, bibasilar crackles bilaterally. No wheezes. GI: Difficult exam due to body habitus. BS present & normal, soft, NTND, no guarding, no rebound, no mass SKIN: no apparent skin lesion Neuro: no deficits noted grossly PSYCH: euthymic mood, endorses hearing voices telling him to hurt himself.  Admits he has passively had thoughts of self-harm lately but knows that's "not going to happen" and has no plan.   He admits past hospitalization for self-harm ideation but denies ever trying to harm others  Brief Hospital Course:  Patient was admitted after being out of his medications for a week while stranded in [place] after losing his bag on a bus trip.  Issues for Follow Up:  1. Patient had social/financial issues that impacted his ability to get his medications for almost a week.   Please offer what assistance/coordination you can. 2. Patient had CT concerning of pneumonia, please offer appropriate respiratory followup and ask about lingering symptoms  Significant Procedures:   Significant Labs and Imaging:   Recent Labs Lab 06/10/17 1245 06/12/17 0430 06/14/17 1542  WBC 9.1 8.4 9.6  HGB 13.4 13.9 15.1  HCT 40.3 41.3 44.3   PLT 217 227 238    Recent Labs Lab 06/10/17 1245 06/11/17 0428 06/12/17 0430 06/14/17 1542  NA 135 137 136 132*  K 4.0 3.8 3.8 4.2  CL 103 104 102 96*  CO2 GLUCOSE 344* 220* 211* 363*  BUN 27*  CREATININE 1.18 1.10 1.09 1.69*  CALCIUM 8.8* 8.7* 9.4 9.8  ALKPHOS  --  72  --  97  AST  --  17  --  17  ALT  --  18  --  20  ALBUMIN  --  3.4*  --  4.4    Dg Chest 2 View  Result Date: 06/10/2017 CLINICAL DATA:  56 year old male diabetic with increasing urination and nausea. EXAM: CHEST  2 VIEW COMPARISON:  Chest x-ray 12/30/2006. FINDINGS: Lung volumes are low. Elevation of the right hemidiaphragm. Opacities in the right lung base may reflect areas of atelectasis and/or airspace consolidation. Left lung is well aerated. No pleural effusions. No evidence of pulmonary edema. Heart size is normal. Mediastinal contours are within normal limits. IMPRESSION: 1. Low lung volumes with elevation of the right hemidiaphragm and areas of atelectasis and/or airspace consolidation in the right lower lobe and right middle lobe. Electronically Signed   By: Trudie Reed M.D.   On: 06/10/2017 13:15   Ct Chest Wo Contrast  Result Date: 06/11/2017 CLINICAL DATA:  F/u pt with pneumonia with dyspnea and fatigueHx of cardiac sx EXAM: CT CHEST WITHOUT CONTRAST TECHNIQUE: Multidetector CT imaging of the chest was performed following the standard protocol without IV contrast. COMPARISON:  Chest radiographs, 06/10/2017 FINDINGS: Cardiovascular: Heart is normal in size and configuration. No significant coronary artery calcifications. [employer]  vessels normal in caliber. No aortic atherosclerosis. Mediastinum/Nodes: Soft tissue attenuation in the anterior mediastinum with no mass effect. This is consistent with residual thymus. 13 mm low-density right thyroid nodule. No neck base or axillary masses or adenopathy. No mediastinal or hilar masses or enlarged lymph nodes. Trachea is patent. Esophagus is  unremarkable. Lungs/Pleura: Linear type opacities extend from the right infrahilar structures into the right lower lobe or right middle lobe, associated with minor bronchiectasis, consistent with atelectasis. There are patchy areas of ground-glass opacity in both upper lobes. There is no evidence of pulmonary edema. No pleural effusion or pneumothorax. Upper Abdomen: No acute findings.  Fatty infiltration of the liver. Musculoskeletal: No fracture or acute finding. No osteoblastic or osteolytic lesions. Minor endplate spurring along the midthoracic spine. IMPRESSION: 1. Right middle and lower lobe opacities likely due to atelectasis. 2. Bilateral upper lobe patchy areas of ground-glass opacity are consistent with multifocal pneumonia. 3. No other acute abnormalities.  No evidence of pulmonary edema. Electronically Signed   By: Amie Portland M.D.   On: 06/11/2017 12:29    Results/Tests Pending at Time of Discharge:   Discharge Medications:  Allergies as of 06/13/2017   No Known Allergies     Medication List    STOP taking these medications   amLODipine 10 MG tablet Commonly known as:  NORVASC     TAKE these medications   acetaminophen 325 MG tablet Commonly known as:  TYLENOL Take 1 tablet (325 mg total) by mouth every 6 (six) hours as needed.   ARIPiprazole 10 MG tablet Commonly known as:  ABILIFY Take 1 tablet (10 mg total) by mouth daily. What changed:  medication strength  how much to take   aspirin 81 MG EC tablet Take 1 tablet (81 mg total) by mouth daily.   atorvastatin 40 MG tablet Commonly known as:  LIPITOR Take 1 tablet (40 mg total) by mouth daily.   gabapentin 300 MG capsule Commonly known as:  NEURONTIN Take 1 capsule (300 mg total) by mouth 2 (two) times daily.   hydrochlorothiazide 25 MG tablet Commonly known as:  HYDRODIURIL Take 1 tablet (25 mg total) by mouth daily.   insulin aspart protamine- aspart (70-30) 100 UNIT/ML injection Commonly known as:   NOVOLOG MIX 70/30 Inject 0.45-0.6 mLs (45-60 Units total) into the skin See admin instructions. Inject 60 units subcutaneously every morning and 45 units at night What changed:  how much to take  when to take this  additional instructions   levofloxacin 750 MG tablet Commonly known as:  LEVAQUIN Take 1 tablet (750 mg total) by mouth daily.   metoprolol tartrate 25 MG tablet Commonly known as:  LOPRESSOR Take 1 tablet (25 mg total) by mouth 2 (two) times daily. What changed:  medication strength  how much to take   multivitamin with minerals Tabs tablet Take 1 tablet by mouth daily.   polyethylene glycol packet Commonly known as:  MIRALAX / GLYCOLAX Take 17 g by mouth 2 (two) times daily.       Discharge Instructions: Please refer to Patient Instructions section of EMR for full details.  Patient was counseled important signs and symptoms that should prompt return to medical care, changes in medications, dietary instructions, activity restrictions, and follow up appointments.   Follow-Up Appointments:   Marthenia Rolling, DO 06/15/2017, 9:53 AM PGY-1, [facility]

## 2017-06-12 NOTE — Clinical Social Work Note (Signed)
Received call from [facility]. Patient was traveling by bus from [place] to [place], [place] to visit his brother with Stage IV cancer. He was on a four hour layover when he was admitted to the hospital. The patient has no belongings and things the bus line gave his bag to someone else. Patient told RNCM he just wants to go home to [place] instead of going to [place] at [place]. CSW left a Hospital doctor of social work. Will discuss possible assistance with transportation home when he returns call.  Charlynn Court, CSW [phone]

## 2017-06-12 NOTE — Progress Notes (Signed)
Family Medicine Teaching Service Daily Progress Note Intern Pager: [phone]  Patient name: Charles Orr Medical record number: [id number] Date of birth: 05-29-1961 Age: 56 y.o. Gender: male  Primary Care Provider: Patient, No Pcp Per Consultants:  Code Status: Full  Pt Overview and Major Events to Date:  Charles Orr is a 56 y.o. male presenting with dyspnea and fatigue . PMH is significant for hypertension, diabetes, schizophrenia, anxiety, chronic back pain and ?Osteoporosis,  Assessment and Plan: Charles Orr is a 56 y.o. male presenting with dyspnea and fatigue . PMH is significant for hypertension, diabetes, schizophrenia, anxiety, chronic back pain and ?Osteoporosis,  Dyspnea and fatigue: likely due to CAP. Patient has viral URI leading to his current symptoms. He also reports chills and fever. Chest x-ray with consolidation in RML and RLL. He has bilateral leg edema and bilateral clavicles on exam concerning for CHF. CXR also concerning for pulmonary edema vs poor penetration. CT concerning for pneumonia -Admit to MedSurg for observation. Attending Dr. Jennette Kettle -Cardiac monitor for 24 hour -Obtain BNP. Will consider diuresing if BNP is elevated -Monitor respiratory status. -History I and O's, and daily weights. -Manage pneumonia as below.  Community acquired pneumonia: CXR with consolidation in RML and RLL. Patient desatted to mid 87s with ambulation.  Status post ceftriaxone and azithromycin in ED. -Continue ceftriaxone and azithromycin  Headache: Likely tension headache based on description of his pain. Only red flag is nausea which could also be due to pneumonia. He has no photophobia or phonophobia. Neuro exam within normal limits. -Tylenol as needed -Morphine as above until we rule out ACS.  Hypertension: Hypertensive to 165/91 on arrival to ED. Supposedly on amlodipine 10 mg and metoprolol but hasn't taken his medication in 5 days. He says he lost his medication back on the  bus. -D/C amlodipine given bilateral edema -Continue metoprolol -We will consider adding hydrochlorothiazide  Hyperglycemia/Diabetes: 200s overnight He says he is on Novolin 70/30 65 units in the morning and 45 units in the evening. He says his lost his insulins about 5 days ago.  -Lantus 15 units nightly -SSI-moderate -Check A1c -CBG before meals and at bedtime -Carb modified and heart healthy diet  Schizophrenia/anxiety: Stable. Not sure what he is on. He says he used to be on Abilify and recently changed to another medication. Not sure about the name of his medication.  -hearing voices, they are discouraging but not ordering him to do anything -has passing thoughts of self -harm but denies that he will act on them as he "knows he shouldn't", no plan, has been hospitalized for selfharm in the past but never harmed others -Will continue to monitor -consider starting on 5 or 10 abilify, will discuss with team  Back pain: Chronic. Stable. He states he is on Norco at home. -Oxycodone 5-10 mg every 6 hrs when necessary for severe pain -Tylenol 650 g every 6 hours  Neuropathic pain: On gabapentin 300 mg twice a day at home. -We will continue home meds  Social situation: He is on disability due to" mental and physical limitation".  -Consult social work and care management  FEN/GI:  -Carb modified and heart healthy diet  Prophylaxis: Lovenox  Disposition: to community likely today or tomorrow, still hoping CM or SW can assist with meds  Subjective:  Patient felt he was coughing all night but was not coughing during exam.   He denies fever symptoms.  He states that voices are increasing in frequency but denies plans to harm  himself or others.   Says he was on abilify 20 at home but doesn't know where he filled the prescription.   We tried to call the clinic but they did not pick up.  We discussed the importance of him using his time in the hospital to call the bus line and family  because he will be discharging soon.  Objective: Temp:  [97.7 F (36.5 C)-98.6 F (37 C)] 98.6 F (37 C) (10/09 0514) Pulse Rate:  [69-85] 69 (10/09 0514) Resp:  [18] 18 (10/09 0514) BP: (114-141)/(59-87) 115/59 (10/09 0514) SpO2:  [98 %-99 %] 99 % (10/09 0514) Physical Exam: GEN: appears well, no apparent distress. Head: normocephalic and atraumatic  Nares: no rhinorrhea, congestion or erythema CVS: RRR, nl s1 & s2, no murmurs RESP: no IWOB, good air movement bilaterally, bibasilar crackles bilaterally. No wheezes. GI:  BS present & normal, soft, NTND, no guarding, no rebound, no mass SKIN: no apparent skin lesion Neuro: no deficits noted grossly PSYCH: euthymic mood, endorses hearing discouraging voices but he knows they aren't real and they aren't telling him to do anything.  Admits he has passively had thoughts of self-harm lately but knows that's "not going to happen" and has no plan.   He admits past hospitalization for self-harm ideation but denies ever trying to harm others  Laboratory:  Recent Labs Lab 06/10/17 1245 06/12/17 0430  WBC 9.1 8.4  HGB 13.4 13.9  HCT 40.3 41.3  PLT 217 227    Recent Labs Lab 06/10/17 1245 06/11/17 0428 06/12/17 0430  NA 135 137 136  K 4.0 3.8 3.8  CL 103 104 102  CO2 BUN CREATININE 1.18 1.10 1.09  CALCIUM 8.8* 8.7* 9.4  PROT  --  6.1*  --   BILITOT  --  0.6  --   ALKPHOS  --  72  --   ALT  --  18  --   AST  --  17  --   GLUCOSE 344* 220* 211*      Imaging/Diagnostic Tests: Dg Chest 2 View  Result Date: 06/10/2017 CLINICAL DATA:  56 year old male diabetic with increasing urination and nausea. EXAM: CHEST  2 VIEW COMPARISON:  Chest x-ray 12/30/2006. FINDINGS: Lung volumes are low. Elevation of the right hemidiaphragm. Opacities in the right lung base may reflect areas of atelectasis and/or airspace consolidation. Left lung is well aerated. No pleural effusions. No evidence of pulmonary edema. Heart size  is normal. Mediastinal contours are within normal limits. IMPRESSION: 1. Low lung volumes with elevation of the right hemidiaphragm and areas of atelectasis and/or airspace consolidation in the right lower lobe and right middle lobe. Electronically Signed   By: Trudie Reed M.D.   On: 06/10/2017 13:15     Marthenia Rolling, DO 06/12/2017, 6:21 AM PGY-1, [facility] FPTS Intern pager: [phone], text pages welcome

## 2017-06-12 NOTE — Progress Notes (Signed)
Patient rolled over and accidentally pulled out IV. Attempted to page Family Medicine about whether new IV needs to be inserted.

## 2017-06-13 LAB — GLUCOSE, CAPILLARY
GLUCOSE-CAPILLARY: 248 mg/dL — AB (ref 65–99)
Glucose-Capillary: 368 mg/dL — ABNORMAL HIGH (ref 65–99)
Glucose-Capillary: 190 mg/dL — ABNORMAL HIGH (ref 65–99)

## 2017-06-13 MED ORDER — POLYETHYLENE GLYCOL 3350 17 G PO PACK
17.0000 g | PACK | Freq: Two times a day (BID) | ORAL | Status: DC
  Administered 2017-06-13: 17 g via ORAL
  Filled 2017-06-13 (×2): qty 1

## 2017-06-13 MED ORDER — ATORVASTATIN CALCIUM 40 MG PO TABS: 40.0000 mg | ORAL_TABLET | Freq: Every day | ORAL | 0 refills | Status: DC

## 2017-06-13 MED ORDER — ARIPIPRAZOLE 10 MG PO TABS: 10.0000 mg | ORAL_TABLET | Freq: Every day | ORAL | 0 refills | Status: DC

## 2017-06-13 MED ORDER — HYDROCHLOROTHIAZIDE 25 MG PO TABS: 25.0000 mg | ORAL_TABLET | Freq: Every day | ORAL | 0 refills | Status: DC

## 2017-06-13 MED ORDER — POLYETHYLENE GLYCOL 3350 17 G PO PACK: 17.0000 g | PACK | Freq: Two times a day (BID) | ORAL | 0 refills | Status: DC

## 2017-06-13 MED ORDER — LEVOFLOXACIN 750 MG PO TABS: 750.0000 mg | ORAL_TABLET | Freq: Every day | ORAL | 0 refills | Status: DC

## 2017-06-13 MED ORDER — METOPROLOL TARTRATE 25 MG PO TABS: 25.0000 mg | ORAL_TABLET | Freq: Two times a day (BID) | ORAL | 0 refills | Status: DC

## 2017-06-13 MED ORDER — GABAPENTIN 300 MG PO CAPS: 300.0000 mg | ORAL_CAPSULE | Freq: Two times a day (BID) | ORAL | 0 refills | Status: DC

## 2017-06-13 MED ORDER — ACETAMINOPHEN 325 MG PO TABS: 325.0000 mg | ORAL_TABLET | Freq: Four times a day (QID) | ORAL | 0 refills | Status: DC | PRN

## 2017-06-13 MED ORDER — INSULIN ASPART PROT & ASPART (70-30 MIX) 100 UNIT/ML ~~LOC~~ SUSP: 45.0000 [IU] | SUBCUTANEOUS | 0 refills | Status: DC

## 2017-06-13 MED ORDER — ASPIRIN 81 MG PO TBEC: 81.0000 mg | DELAYED_RELEASE_TABLET | Freq: Every day | ORAL | 0 refills | Status: DC

## 2017-06-13 MED FILL — ARIPiprazole 10 MG TABS: 10 | 30 days supply | Qty: 30 | Fill #0

## 2017-06-13 MED FILL — levoFLOXacin 750 MG TABS: 750 | 4 days supply | Qty: 4 | Fill #0

## 2017-06-13 MED FILL — POLYETHYLENE GLYCOL 3350 PO: 14 days supply | Qty: 14 | Fill #0

## 2017-06-13 MED FILL — HYDROCHLOROTHIAZIDE 25 MG T: 25 | 30 days supply | Qty: 30 | Fill #0

## 2017-06-13 MED FILL — NOVOLOG MIX 70/30 VIAL: (70-30) 100 | 28 days supply | Qty: 30 | Fill #0

## 2017-06-13 MED FILL — ULTICARE SYR 1 ML 30GX5/16: 30G X 5/16" | 30 days supply | Qty: 60 | Fill #0

## 2017-06-13 MED FILL — METOPROLOL TARTRATE 25 MG T: 25 | 30 days supply | Qty: 60 | Fill #0

## 2017-06-13 MED FILL — GABAPENTIN 300 MG CAPSULE: 300 | 30 days supply | Qty: 60 | Fill #0

## 2017-06-13 MED FILL — ULTICARE SYR 1 ML 30GX5/16": 30G X 5/16" | 30 days supply | Qty: 60 | Fill #0

## 2017-06-13 MED FILL — ATORVASTATIN 40 MG TABLET: 40 | 30 days supply | Qty: 30 | Fill #0

## 2017-06-13 NOTE — Progress Notes (Signed)
Family Medicine Teaching Service Daily Progress Note Intern Pager: [phone]  Patient name: Charles Orr Medical record number: [id number] Date of birth: 05-24-1961 Age: 56 y.o. Gender: male  Primary Care Provider: Patient, No Pcp Per Consultants:  Code Status: Full  Pt Overview and Major Events to Date:  Charles Orr is a 56 y.o. male presenting with dyspnea and fatigue . PMH is significant for hypertension, diabetes, schizophrenia, anxiety, chronic back pain and ?Osteoporosis,  Assessment and Plan: Charles Orr is a 56 y.o. male presenting with dyspnea and fatigue . PMH is significant for hypertension, diabetes, schizophrenia, anxiety, chronic back pain and ?Osteoporosis,  Dyspnea and fatigue: likely due to CAP. Chest x-ray with consolidation in RML and RLL. He has bilateral leg edema and bilateral clavicles on exam concerning for CHF. CXR also concerning for pulmonary edema vs poor penetration. Edema could be due to his Norvasc as well. He is not anemic. not able to find this in Epic or care everywhere.He has already received ceftriaxone and azithromycin in ED. -Admit to MedSurg for observation. Attending Dr. Jennette Kettle -Monitor respiratory status. -History I and O's, and daily weights. -Manage pneumonia as below.  Community acquired pneumonia: CXR with consolidation in RML and RLL. Patient desatted to mid 54s with ambulation.  Status post ceftriaxone and azithromycin in ED. -levoquin  daily  Headache: Likely tension headache based on description of his pain. Only red flag is nausea which could also be due to pneumonia. He has no photophobia or phonophobia. Neuro exam within normal limits. -Tylenol as needed -Morphine as above until we rule out ACS.  Hypertension: Hypertensive to 165/91 on arrival to ED. Supposedly on amlodipine 10 mg and metoprolol but hasn't taken his medication in 5 days. He says he lost his medication back on the bus. -D/C amlodipine given bilateral  edema -Continue metoprolol -We will consider adding hydrochlorothiazide  Hyperglycemia/Diabetes: CBG 344 on admission. He is not in DKA. Not sure about last A1c. He says he is on Novolin 70/30 65 units in the morning and 45 units in the evening. He says his lost his insulins about 5 days ago.  -Lantus 20 units nightly -SSI-moderate -Check A1c -CBG before meals and at bedtime -Carb modified and heart healthy diet  Schizophrenia/anxiety: Stable. States He says he is on Abilify .  -abilify 20 -hearing voices, they are discouraging but not ordering him to do anything -has passing thoughts of self -harm but denies that he will act on them as he "knows he shouldn't", no plan, has been hospitalized for selfharm in the past but never harmed others -Will continue to monitor  Back pain: Chronic. Stable. He states he is on Norco at home. -Oxycodone 5-10 mg every 6 hrs when necessary for severe pain -Tylenol 650 g every 6 hours  Neuropathic pain: On gabapentin 300 mg twice a day at home. -We will continue home meds  Social situation: He is on disability due to" mental and physical limitation".  -Consult social work and care management  FEN/GI:  -Carb modified and heart healthy diet  Prophylaxis: Lovenox  Disposition: to community, has no ID but CM found his medicaid #, SW might be able to help with bus ticket  Subjective:  Feels his breathing is improving, states voices are increasing in frequency and he states they are telling him to hurt himself but he knows he is not going to do so.  Denies any thought of hurting others.  Objective: Temp:  [98.1 F (36.7 C)-98.4 F (36.9  C)] 98.1 F (36.7 C) (10/10 0600) Pulse Rate:  [78-82] 78 (10/10 0600) Resp:  [17-18] 18 (10/10 0600) BP: (110-132)/(67-89) 132/72 (10/10 0600) SpO2:  [97 %-98 %] 98 % (10/10 0600) Weight:  [264 lb 14.4 oz (120.2 kg)] 264 lb 14.4 oz (120.2 kg) (10/10 0600) Physical Exam: GEN: appears well, no apparent  distress. Head: normocephalic and atraumatic  Eyes: conjunctiva with mild injection bilaterally, sclera anicteric, PERRL, EOMI, No nystagmus Nares: no rhinorrhea, congestion or erythema CVS: RRR, nl s1 & s2, no murmurs RESP: no IWOB, good air movement bilaterally, bibasilar crackles bilaterally. No wheezes. GI: Difficult exam due to body habitus. BS present & normal, soft, NTND, no guarding, no rebound, no mass SKIN: no apparent skin lesion Neuro: no deficits noted grossly PSYCH: euthymic mood, endorses hearing voices telling him to hurt himself.  Admits he has passively had thoughts of self-harm lately but knows that's "not going to happen" and has no plan.   He admits past hospitalization for self-harm ideation but denies ever trying to harm others  Laboratory:  Recent Labs Lab 06/10/17 1245 06/12/17 0430  WBC 9.1 8.4  HGB 13.4 13.9  HCT 40.3 41.3  PLT 217 227    Recent Labs Lab 06/10/17 1245 06/11/17 0428 06/12/17 0430  NA 135 137 136  K 4.0 3.8 3.8  CL 103 104 102  CO2 BUN CREATININE 1.18 1.10 1.09  CALCIUM 8.8* 8.7* 9.4  PROT  --  6.1*  --   BILITOT  --  0.6  --   ALKPHOS  --  72  --   ALT  --  18  --   AST  --  17  --   GLUCOSE 344* 220* 211*      Imaging/Diagnostic Tests: Dg Chest 2 View  Result Date: 06/10/2017 CLINICAL DATA:  56 year old male diabetic with increasing urination and nausea. EXAM: CHEST  2 VIEW COMPARISON:  Chest x-ray 12/30/2006. FINDINGS: Lung volumes are low. Elevation of the right hemidiaphragm. Opacities in the right lung base may reflect areas of atelectasis and/or airspace consolidation. Left lung is well aerated. No pleural effusions. No evidence of pulmonary edema. Heart size is normal. Mediastinal contours are within normal limits. IMPRESSION: 1. Low lung volumes with elevation of the right hemidiaphragm and areas of atelectasis and/or airspace consolidation in the right lower lobe and right middle lobe.  Electronically Signed   By: Trudie Reed M.D.   On: 06/10/2017 13:15     Marthenia Rolling, DO 06/13/2017, 6:04 AM PGY-1, [facility] FPTS Intern pager: [phone], text pages welcome

## 2017-06-13 NOTE — Progress Notes (Signed)
Patient was in his bed awake. No family member available. Patient was getting ready to leave after discharge. Patient requested for prayer which I helped lead. Chaplain also provided emotional support, reflective listening and compassionate presence.

## 2017-06-13 NOTE — Progress Notes (Signed)
Nsg Discharge Note  Admit Date:  06/10/2017 Discharge date: 06/13/2017   DRAVYN SEVERS to be D/C'd Home per MD order.  AVS completed.  Copy for chart, and copy for patient signed, and dated. Patient/caregiver able to verbalize understanding.  Discharge Medication: Allergies as of 06/13/2017   No Known Allergies     Medication List    STOP taking these medications   amLODipine 10 MG tablet Commonly known as:  NORVASC     TAKE these medications   acetaminophen 325 MG tablet Commonly known as:  TYLENOL Take 1 tablet (325 mg total) by mouth every 6 (six) hours as needed.   ARIPiprazole 10 MG tablet Commonly known as:  ABILIFY Take 1 tablet (10 mg total) by mouth daily. What changed:  medication strength  how much to take   aspirin 81 MG EC tablet Take 1 tablet (81 mg total) by mouth daily.   atorvastatin 40 MG tablet Commonly known as:  LIPITOR Take 1 tablet (40 mg total) by mouth daily.   gabapentin 300 MG capsule Commonly known as:  NEURONTIN Take 1 capsule (300 mg total) by mouth 2 (two) times daily.   hydrochlorothiazide 25 MG tablet Commonly known as:  HYDRODIURIL Take 1 tablet (25 mg total) by mouth daily.   insulin aspart protamine- aspart (70-30) 100 UNIT/ML injection Commonly known as:  NOVOLOG MIX 70/30 Inject 0.45-0.6 mLs (45-60 Units total) into the skin See admin instructions. Inject 60 units subcutaneously every morning and 45 units at night What changed:  how much to take  when to take this  additional instructions   levofloxacin 750 MG tablet Commonly known as:  LEVAQUIN Take 1 tablet (750 mg total) by mouth daily.   metoprolol tartrate 25 MG tablet Commonly known as:  LOPRESSOR Take 1 tablet (25 mg total) by mouth 2 (two) times daily. What changed:  medication strength  how much to take   multivitamin with minerals Tabs tablet Take 1 tablet by mouth daily.   polyethylene glycol packet Commonly known as:  MIRALAX / GLYCOLAX Take  17 g by mouth 2 (two) times daily.       Discharge Assessment: Vitals:   06/13/17 0857 06/13/17 1507  BP: 119/77 125/80  Pulse:  75  Resp:  16  Temp:  98.7 F (37.1 C)  SpO2:  98%   Skin clean, dry and intact without evidence of skin break down, no evidence of skin tears noted. IV catheter discontinued intact. Site without signs and symptoms of complications - no redness or edema noted at insertion site, patient denies c/o pain - only slight tenderness at site.  Dressing with slight pressure applied.  D/c Instructions-Education: Discharge instructions given to patient/family with verbalized understanding. D/c education completed with patient/family including follow up instructions, medication list, d/c activities limitations if indicated, with other d/c instructions as indicated by MD - patient able to verbalize understanding, all questions fully answered. Patient instructed to return to ED, call 911, or call MD for any changes in condition.  Patient escorted via WC, and D/C home via private auto.  Kathyann Spaugh Consuella Lose, RN 06/13/2017 5:54 PM

## 2017-06-13 NOTE — Progress Notes (Signed)
Patient unable to get back to [facility] and has no money to pay for a train/bus. CSW provided [employer] train ticket for tonight (7pm) to get to [place]. CSW provided taxi voucher to get to [facility] train station on shadow chart for when patient is ready to leave.   CSW signing off.  Osborne Casco Lashan Macias LCSWA [phone]

## 2017-06-13 NOTE — Discharge Instructions (Signed)
Sir,  You were seen in the hospital for pneumonia and high blood sugar.    Your condition has improved and you are being discharged with 4 more days of antibiotics.   Please see your regular doctor at the [facility] early next week to followup.    Community-Acquired Pneumonia, Adult Pneumonia is an infection of the lungs. One type of pneumonia can happen while a person is in a hospital. A different type can happen when a person is not in a hospital (community-acquired pneumonia). It is easy for this kind to spread from person to person. It can spread to you if you breathe near an infected person who coughs or sneezes. Some symptoms include:  A dry cough.  A wet (productive) cough.  Fever.  Sweating.  Chest pain.  Follow these instructions at home:  Take over-the-counter and prescription medicines only as told by your doctor. ? Only take cough medicine if you are losing sleep. ? If you were prescribed an antibiotic medicine, take it as told by your doctor. Do not stop taking the antibiotic even if you start to feel better.  Sleep with your head and neck raised (elevated). You can do this by putting a few pillows under your head, or you can sleep in a recliner.  Do not use tobacco products. These include cigarettes, chewing tobacco, and e-cigarettes. If you need help quitting, ask your doctor.  Drink enough water to keep your pee (urine) clear or pale yellow. A shot (vaccine) can help prevent pneumonia. Shots are often suggested for:  People older than 56 years of age.  People older than 56 years of age: ? Who are having cancer treatment. ? Who have long-term (chronic) lung disease. ? Who have problems with their body's defense system (immune system).  You may also prevent pneumonia if you take these actions:  Get the flu (influenza) shot every year.  Go to the dentist as often as told.  Wash your hands often. If soap and water are not available, use hand  sanitizer.  Contact a doctor if:  You have a fever.  You lose sleep because your cough medicine does not help. Get help right away if:  You are short of breath and it gets worse.  You have more chest pain.  Your sickness gets worse. This is very serious if: ? You are an older adult. ? Your body's defense system is weak.  You cough up blood. This information is not intended to replace advice given to you by your health care provider. Make sure you discuss any questions you have with your health care provider. Document Released: 02/07/2008 Document Revised: 01/27/2016 Document Reviewed: 12/16/2014 Elsevier Interactive Patient Education  [employer].

## 2017-06-14 ENCOUNTER — Encounter (HOSPITAL_COMMUNITY): Payer: Self-pay | Admitting: Emergency Medicine

## 2017-06-14 ENCOUNTER — Emergency Department (HOSPITAL_COMMUNITY)
Admission: EM | Admit: 2017-06-14 | Discharge: 2017-06-15 | Disposition: A | Discharge status: Other Acute Inpatient Hospital | Payer: Medicaid Other | Attending: Emergency Medicine | Admitting: Emergency Medicine

## 2017-06-14 DIAGNOSIS — J181 Lobar pneumonia, unspecified organism: Secondary | ICD-10-CM | POA: Diagnosis not present

## 2017-06-14 DIAGNOSIS — Z7982 Long term (current) use of aspirin: Secondary | ICD-10-CM | POA: Diagnosis not present

## 2017-06-14 DIAGNOSIS — I10 Essential (primary) hypertension: Secondary | ICD-10-CM | POA: Diagnosis not present

## 2017-06-14 DIAGNOSIS — F32A Depression, unspecified: Secondary | ICD-10-CM

## 2017-06-14 DIAGNOSIS — F329 Major depressive disorder, single episode, unspecified: Secondary | ICD-10-CM | POA: Diagnosis present

## 2017-06-14 DIAGNOSIS — R739 Hyperglycemia, unspecified: Secondary | ICD-10-CM | POA: Diagnosis not present

## 2017-06-14 DIAGNOSIS — R45851 Suicidal ideations: Secondary | ICD-10-CM

## 2017-06-14 DIAGNOSIS — E119 Type 2 diabetes mellitus without complications: Secondary | ICD-10-CM | POA: Diagnosis not present

## 2017-06-14 DIAGNOSIS — Z79899 Other long term (current) drug therapy: Secondary | ICD-10-CM | POA: Insufficient documentation

## 2017-06-14 DIAGNOSIS — R600 Localized edema: Secondary | ICD-10-CM | POA: Diagnosis not present

## 2017-06-14 DIAGNOSIS — Z008 Encounter for other general examination: Secondary | ICD-10-CM

## 2017-06-14 LAB — COMPREHENSIVE METABOLIC PANEL
ALBUMIN: 4.4 g/dL (ref 3.5–5.0)
ALT: 20 U/L (ref 17–63)
AST: 17 U/L (ref 15–41)
Alkaline Phosphatase: 97 U/L (ref 38–126)
Anion gap: 13 (ref 5–15)
BUN: 27 mg/dL — AB (ref 6–20)
CHLORIDE: 96 mmol/L — AB (ref 101–111)
CO2: 23 mmol/L (ref 22–32)
Calcium: 9.8 mg/dL (ref 8.9–10.3)
Creatinine, Ser: 1.69 mg/dL — ABNORMAL HIGH (ref 0.61–1.24)
GFR calc Af Amer: 51 mL/min — ABNORMAL LOW (ref >60)
GFR calc non Af Amer: 44 mL/min — ABNORMAL LOW (ref >60)
GLUCOSE: 363 mg/dL — AB (ref 65–99)
POTASSIUM: 4.2 mmol/L (ref 3.5–5.1)
Sodium: 132 mmol/L — ABNORMAL LOW (ref 135–145)
Total Bilirubin: 0.6 mg/dL (ref 0.3–1.2)
Total Protein: 8.1 g/dL (ref 6.5–8.1)

## 2017-06-14 LAB — RAPID URINE DRUG SCREEN, HOSP PERFORMED
AMPHETAMINES: NOT DETECTED
BENZODIAZEPINES: NOT DETECTED
Barbiturates: NOT DETECTED
COCAINE: NOT DETECTED
Opiates: NOT DETECTED
Tetrahydrocannabinol: NOT DETECTED

## 2017-06-14 LAB — URINALYSIS, ROUTINE W REFLEX MICROSCOPIC
BACTERIA UA: NONE SEEN
Bilirubin Urine: NEGATIVE
Glucose, UA: >=500 mg/dL — AB
Ketones, ur: NEGATIVE mg/dL
Leukocytes, UA: NEGATIVE
Nitrite: NEGATIVE
PH: 5 (ref 5.0–8.0)
Protein, ur: NEGATIVE mg/dL
SPECIFIC GRAVITY, URINE: 1.023 (ref 1.005–1.030)
SQUAMOUS EPITHELIAL / LPF: NONE SEEN

## 2017-06-14 LAB — CBG MONITORING, ED
GLUCOSE-CAPILLARY: 371 mg/dL — AB (ref 65–99)
GLUCOSE-CAPILLARY: 338 mg/dL — AB (ref 65–99)

## 2017-06-14 LAB — CBC
HCT: 44.3 % (ref 39.0–52.0)
Hemoglobin: 15.1 g/dL (ref 13.0–17.0)
MCH: 27.4 pg (ref 26.0–34.0)
MCHC: 34.1 g/dL (ref 30.0–36.0)
MCV: 80.3 fL (ref 78.0–100.0)
PLATELETS: 238 10*3/uL (ref 150–400)
RBC: 5.52 MIL/uL (ref 4.22–5.81)
RDW: 13.4 % (ref 11.5–15.5)
WBC: 9.6 10*3/uL (ref 4.0–10.5)

## 2017-06-14 LAB — ETHANOL

## 2017-06-14 LAB — ACETAMINOPHEN LEVEL

## 2017-06-14 LAB — SALICYLATE LEVEL: Salicylate Lvl: <7 mg/dL (ref 2.8–30.0)

## 2017-06-14 MED ORDER — METOPROLOL TARTRATE 25 MG PO TABS
25.0000 mg | ORAL_TABLET | Freq: Two times a day (BID) | ORAL | Status: DC
  Administered 2017-06-14 – 2017-06-15 (×2): 25 mg via ORAL
  Filled 2017-06-14 (×2): qty 1

## 2017-06-14 MED ORDER — INSULIN ASPART PROT & ASPART (70-30 MIX) 100 UNIT/ML ~~LOC~~ SUSP
45.0000 [IU] | Freq: Every day | SUBCUTANEOUS | Status: DC
  Administered 2017-06-14: 45 [IU] via SUBCUTANEOUS
  Filled 2017-06-14: qty 10

## 2017-06-14 MED ORDER — HYDROCHLOROTHIAZIDE 25 MG PO TABS
25.0000 mg | ORAL_TABLET | Freq: Every day | ORAL | Status: DC
  Administered 2017-06-14 – 2017-06-15 (×2): 25 mg via ORAL
  Filled 2017-06-14 (×2): qty 1

## 2017-06-14 MED ORDER — ATORVASTATIN CALCIUM 40 MG PO TABS
40.0000 mg | ORAL_TABLET | Freq: Every day | ORAL | Status: DC
  Administered 2017-06-14 – 2017-06-15 (×2): 40 mg via ORAL
  Filled 2017-06-14 (×2): qty 1

## 2017-06-14 MED ORDER — ACETAMINOPHEN 325 MG PO TABS
650.0000 mg | ORAL_TABLET | Freq: Four times a day (QID) | ORAL | Status: DC | PRN
  Administered 2017-06-14 – 2017-06-15 (×2): 650 mg via ORAL
  Filled 2017-06-14 (×2): qty 2

## 2017-06-14 MED ORDER — ONDANSETRON HCL 4 MG PO TABS: 4.0000 mg | ORAL_TABLET | Freq: Three times a day (TID) | ORAL | Status: DC | PRN

## 2017-06-14 MED ORDER — INSULIN ASPART PROT & ASPART (70-30 MIX) 100 UNIT/ML ~~LOC~~ SUSP
65.0000 [IU] | Freq: Every day | SUBCUTANEOUS | Status: DC
  Administered 2017-06-15: 65 [IU] via SUBCUTANEOUS
  Filled 2017-06-14: qty 10

## 2017-06-14 MED ORDER — POLYETHYLENE GLYCOL 3350 17 G PO PACK
17.0000 g | PACK | Freq: Two times a day (BID) | ORAL | Status: DC | PRN
  Filled 2017-06-14: qty 1

## 2017-06-14 MED ORDER — ASPIRIN EC 81 MG PO TBEC
81.0000 mg | DELAYED_RELEASE_TABLET | Freq: Every day | ORAL | Status: DC
  Administered 2017-06-14 – 2017-06-15 (×2): 81 mg via ORAL
  Filled 2017-06-14 (×2): qty 1

## 2017-06-14 MED ORDER — INSULIN ASPART 100 UNIT/ML ~~LOC~~ SOLN
0.0000 [IU] | Freq: Three times a day (TID) | SUBCUTANEOUS | Status: DC
  Administered 2017-06-15: 8 [IU] via SUBCUTANEOUS
  Filled 2017-06-14 (×2): qty 1

## 2017-06-14 MED ORDER — LEVOFLOXACIN 750 MG PO TABS
750.0000 mg | ORAL_TABLET | Freq: Every day | ORAL | Status: DC
  Administered 2017-06-14 – 2017-06-15 (×2): 750 mg via ORAL
  Filled 2017-06-14 (×2): qty 1

## 2017-06-14 MED ORDER — GABAPENTIN 300 MG PO CAPS
300.0000 mg | ORAL_CAPSULE | Freq: Two times a day (BID) | ORAL | Status: DC
  Administered 2017-06-14 – 2017-06-15 (×2): 300 mg via ORAL
  Filled 2017-06-14 (×2): qty 1

## 2017-06-14 MED ORDER — ADULT MULTIVITAMIN W/MINERALS CH
1.0000 | ORAL_TABLET | Freq: Every day | ORAL | Status: DC
  Administered 2017-06-14 – 2017-06-15 (×2): 1 via ORAL
  Filled 2017-06-14 (×2): qty 1

## 2017-06-14 MED ORDER — INSULIN ASPART PROT & ASPART (70-30 MIX) 100 UNIT/ML ~~LOC~~ SUSP: 45.0000 [IU] | SUBCUTANEOUS | Status: DC

## 2017-06-14 MED ORDER — ARIPIPRAZOLE 10 MG PO TABS
10.0000 mg | ORAL_TABLET | Freq: Every day | ORAL | Status: DC
  Administered 2017-06-14 – 2017-06-15 (×2): 10 mg via ORAL
  Filled 2017-06-14 (×2): qty 1

## 2017-06-14 NOTE — ED Notes (Signed)
Pt admitted to room #40. Pt reports depression. Endorsing passive SI, verbally contracts for safety. Pt reports AH. Denies HI/VH. Pt identifies "loneliness" as his primary stressor. Encouragement and support provided. Special checks q 15 mins in place for safety, Video monitoring in place. Will continue to monitor.

## 2017-06-14 NOTE — ED Provider Notes (Signed)
[facility]-EMERGENCY DEPT Provider Note   CSN: [id number] Arrival date & time: 06/14/17  1512     History   Chief Complaint Chief Complaint  Patient presents with  . Depression    HPI Charles Orr is a 56 y.o. male with a history of bipolar, schizophrenia, type 2 diabetes, hypertension who presents the emergency department today for depression and suicidal ideation. The patient states that his depression started while he was in the hospital recently for pneumonia. The patient is originally from [place] but is currently living in [place], bouncing around from [place] to [place]. He states that he has had increasing difficulty with sleep, loss of interests, and suicidal ideation over the 3 days. He states that he thinks of harming himself frequently and if he was to do so he would choose to overdose on medications and cocaine. He has tried the same in the past. He says normally he turns to drugs and alcohol when he gets an episode of depression but he has not done this recently. No drug or alcohol use in the last 6 months. The patient does see a psychiatrist in [place]. He notes over the last 2 days he has had some frequency and urgency. Denies dysuria, flank pain, suprapubic pain or hematuria. The patient denies any chest pain, shortness of breath, abdominal pain, nausea, vomiting, HA, lightheadedness fever, or any other physical complaints at this time. He denies HI.   HPI  Past Medical History:  Diagnosis Date  . Anxiety   . Arthritis    "back" (06/12/2017)  . Bipolar disorder (HCC)   . CAP (community acquired pneumonia) 06/10/2017  . Chronic lower back pain   . Degenerative disc disease, lumbar   . Depression   . Fibromyalgia   . GERD (gastroesophageal reflux disease)   . High cholesterol   . Hypertension   . Myocardial infarction (HCC) 2002  . Pneumonia 1989  . Schizophrenia (HCC)   . Type II diabetes mellitus [facility])     Patient Active Problem List   Diagnosis Date Noted  .  Other schizophrenia (HCC)   . Pneumonia 06/11/2017  . Community acquired pneumonia of right lower lobe of lung (HCC)   . Hyperglycemia   . Localized edema   . Shortness of breath 06/10/2017    Past Surgical History:  Procedure Laterality Date  . CARDIAC CATHETERIZATION  12/2006   [name] 01/18/2011  . INGUINAL HERNIA REPAIR Right   . PARATHYROIDECTOMY    . PERICARDIAL TAP  2000   ?; in [place]/notes 01/18/2011       Home Medications    Prior to Admission medications   Medication Sig Start Date End Date Taking? Authorizing Provider  acetaminophen (TYLENOL) 325 MG tablet Take 1 tablet (325 mg total) by mouth every 6 (six) hours as needed. Patient taking differently: Take 1,300 mg by mouth every 6 (six) hours as needed for mild pain.  06/13/17 07/13/17 Yes Bland, Scott, DO  ARIPiprazole (ABILIFY) 10 MG tablet Take 1 tablet (10 mg total) by mouth daily. 06/13/17  Yes Marthenia Rolling, DO  aspirin 81 MG EC tablet Take 1 tablet (81 mg total) by mouth daily. 06/13/17  Yes Bland, Scott, DO  atorvastatin (LIPITOR) 40 MG tablet Take 1 tablet (40 mg total) by mouth daily. 06/13/17  Yes Bland, Scott, DO  gabapentin (NEURONTIN) 300 MG capsule Take 1 capsule (300 mg total) by mouth 2 (two) times daily. 06/13/17  Yes Bland, Scott, DO  hydrochlorothiazide (HYDRODIURIL) 25 MG tablet Take 1 tablet (  25 mg total) by mouth daily. 06/13/17  Yes Bland, Scott, DO  insulin aspart protamine- aspart (NOVOLOG MIX 70/30) (70-30) 100 UNIT/ML injection Inject 0.45-0.6 mLs (45-60 Units total) into the skin See admin instructions. Inject 60 units subcutaneously every morning and 45 units at night Patient taking differently: Inject 45-60 Units into the skin See admin instructions. Inject 65 units subcutaneously every morning and 45 units at night 06/13/17  Yes Bland, Scott, DO  levofloxacin (LEVAQUIN) 750 MG tablet Take 1 tablet (750 mg total) by mouth daily. 06/13/17  Yes Bland, Scott, DO  metoprolol tartrate (LOPRESSOR)  25 MG tablet Take 1 tablet (25 mg total) by mouth 2 (two) times daily. 06/13/17  Yes Marthenia Rolling, DO  Multiple Vitamin (MULTIVITAMIN WITH MINERALS) TABS tablet Take 1 tablet by mouth daily.   Yes [provider]  polyethylene glycol (MIRALAX / GLYCOLAX) packet Take 17 g by mouth 2 (two) times daily. 06/13/17  Yes Marthenia Rolling, DO    Family History No family history on file.  Social History Social History  Substance Use Topics  . Smoking status: Current Every Day Smoker    Packs/day: 0.50    Years: 38.00    Types: Cigarettes  . Smokeless tobacco: Current User    Types: Chew  . Alcohol use Yes     Comment: 06/12/2017 "stopped ~ 1 yr ago"     Allergies   Patient has no known allergies.   Review of Systems Review of Systems  All other systems reviewed and are negative.    Physical Exam Updated Vital Signs BP 135/79 (BP Location: Right Arm)   Pulse 92   Temp 98.4 F (36.9 C) (Oral)   Resp 16   SpO2 98%   Physical Exam  Constitutional: He appears well-developed and well-nourished.  HENT:  Head: Normocephalic and atraumatic.  Right Ear: External ear normal.  Left Ear: External ear normal.  Nose: Nose normal.  Mouth/Throat: Uvula is midline, oropharynx is clear and moist and mucous membranes are normal. No tonsillar exudate.  Eyes: Pupils are equal, round, and reactive to light. Right eye exhibits no discharge. Left eye exhibits no discharge. No scleral icterus.  Neck: Trachea normal. Neck supple. No spinous process tenderness present. No neck rigidity. Normal range of motion present.  Cardiovascular: Normal rate, regular rhythm and intact distal pulses.   No murmur heard. Pulses:      Radial pulses are 2+ on the right side, and 2+ on the left side.       Dorsalis pedis pulses are 2+ on the right side, and 2+ on the left side.       Posterior tibial pulses are 2+ on the right side, and 2+ on the left side.  Minimal b/l lower extremity swelling. Trace  pretibial edema. Calves symmetric in size bilaterally.  Pulmonary/Chest: Effort normal and breath sounds normal. He exhibits no tenderness.  Abdominal: Soft. Bowel sounds are normal. There is no tenderness. There is no rebound, no guarding and no CVA tenderness.  Musculoskeletal: He exhibits no edema.  Lymphadenopathy:    He has no cervical adenopathy.  Neurological: He is alert.  Speech clear. Follows commands. No facial droop. PERRLA. EOM grossly intact. CN III-XII grossly intact. Grossly moves all extremities 4 without ataxia. Able and appropriate strength for age to upper and lower extremities bilaterally including grip strength.   Skin: Skin is warm and dry. No rash noted. He is not diaphoretic.  Psychiatric: He has a normal mood and affect.  Nursing  note and vitals reviewed.    ED Treatments / Results  Labs (all labs ordered are listed, but only abnormal results are displayed) Labs Reviewed  COMPREHENSIVE METABOLIC PANEL - Abnormal; Notable for the following:       Result Value   Sodium 132 (*)    Chloride 96 (*)    Glucose, Bld 363 (*)    BUN 27 (*)    Creatinine, Ser 1.69 (*)    GFR calc non Af Amer 44 (*)    GFR calc Af Amer 51 (*)    All other components within normal limits  ACETAMINOPHEN LEVEL - Abnormal; Notable for the following:    Acetaminophen (Tylenol), Serum <10 (*)    All other components within normal limits  URINALYSIS, ROUTINE W REFLEX MICROSCOPIC - Abnormal; Notable for the following:    Color, Urine STRAW (*)    Glucose, UA >=500 (*)    Hgb urine dipstick SMALL (*)    All other components within normal limits  CBG MONITORING, ED - Abnormal; Notable for the following:    Glucose-Capillary 371 (*)    All other components within normal limits  CBG MONITORING, ED - Abnormal; Notable for the following:    Glucose-Capillary 338 (*)    All other components within normal limits  ETHANOL  SALICYLATE LEVEL  CBC  RAPID URINE DRUG SCREEN, HOSP PERFORMED     EKG  EKG Interpretation None       Radiology No results found.  Procedures Procedures (including critical care time)  Medications Ordered in ED Medications  polyethylene glycol (MIRALAX / GLYCOLAX) packet 17 g (not administered)  multivitamin with minerals tablet 1 tablet (1 tablet Oral Given 06/14/17 1831)  metoprolol tartrate (LOPRESSOR) tablet 25 mg (not administered)  levofloxacin (LEVAQUIN) tablet 750 mg (750 mg Oral Given 06/14/17 2050)  hydrochlorothiazide (HYDRODIURIL) tablet 25 mg (25 mg Oral Given 06/14/17 2050)  gabapentin (NEURONTIN) capsule 300 mg (not administered)  atorvastatin (LIPITOR) tablet 40 mg (40 mg Oral Given 06/14/17 2050)  aspirin EC tablet 81 mg (81 mg Oral Given 06/14/17 2050)  ARIPiprazole (ABILIFY) tablet 10 mg (10 mg Oral Given 06/14/17 1831)  acetaminophen (TYLENOL) tablet 650 mg (650 mg Oral Given 06/14/17 1859)  ondansetron (ZOFRAN) tablet 4 mg (not administered)  insulin aspart protamine- aspart (NOVOLOG MIX 70/30) injection 65 Units (not administered)  insulin aspart protamine- aspart (NOVOLOG MIX 70/30) injection 45 Units (45 Units Subcutaneous Given 06/14/17 1830)  insulin aspart (novoLOG) injection 0-15 Units (not administered)     Initial Impression / Assessment and Plan / ED Course  I have reviewed the triage vital signs and the nursing notes.  Pertinent labs & imaging results that were available during my care of the patient were reviewed by me and considered in my medical decision making (see chart for details).     Pt presents to the ED for medical clearance.  Pt is currently having SI. Plan is to OD. He has tried this in the past. Denies HI ideations. The patients demeanor is dysphoric. Admits difficulty sleeping, loss of interests, feelings of worthlessness. He was recently in the hospital for PNA. Lungs CTA b/l. Noted to have minimal b/l leg swelling since admission. No SOB or cough currently and BNP in hospital was negative  while patient previously had this. No unilateral leg swelling. Chart review says likely due to amlodipine. Patient recently told to stop this (yesterday). Patient is noted to have BUN and Cr elevated. Discussed this with Dr. Lynelle Doctor who advised  to encouraged oral fluids. Hyperglycemia noted. No anion gap acidosis. Will have patient receive home insulin and recheck CBG. Patient does report some urinary symptoms. No CVA TTP, fever, or N/V. Will check UA. No other acute physical complaints and patient is in no acute distress. Psych hold orders placed. Home meds reordered. TTS consulted. Pt was moved to Psych ED.  UA without signs of infection. Frequency likely due to hyperglycemia. CBG with hyperglycemia after home meds. Will place patient on sliding scale. TTS consult recommends overnight observation.   Final Clinical Impressions(s) / ED Diagnoses   Final diagnoses:  Depression, unspecified depression type  Suicidal ideation  Encounter for medical clearance for patient hold    New Prescriptions New Prescriptions   No medications on file     Princella Pellegrini 06/14/17 2153    Jacinto Halim, PA-C 06/14/17 2154    Linwood Dibbles, MD 06/15/17 1246

## 2017-06-14 NOTE — [facility] Counselor (Addendum)
Clinician discussed pt's disposition (Per Nanine Means, DNP, overnight observation) with Dr. Lynelle Doctor and Leslie Andrea, RN.    Redmond Pulling, MS, [facility], [facility] Triage Specialist [phone]
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
## 2017-06-14 NOTE — ED Triage Notes (Signed)
Pt BIB [facility] with complaint of "severe depression"; "called because have thoughts of hurting myself." Pt denies plan.

## 2017-06-14 NOTE — ED Provider Notes (Signed)
Medical screening examination/treatment/procedure(s) were performed by non-physician practitioner and as supervising physician I was immediately available for consultation/collaboration.  Pt was assessed by TTS.  Overnight observation recommended    Linwood Dibbles, MD 06/14/17 2111

## 2017-06-14 NOTE — ED Notes (Signed)
Pt given specimen cup and encouraged to provide urine as ordered by MD.

## 2017-06-14 NOTE — ED Notes (Addendum)
Spoke with EDP informed to give ordered Novolog mix 70/30 45 units and recheck CBG in a couple of hours. Will report off to oncoming nurse.

## 2017-06-14 NOTE — ED Notes (Signed)
Bed: WA28 Expected date:  Expected time:  Means of arrival:  Comments: 

## 2017-06-14 NOTE — ED Notes (Signed)
This nurse notified EDP -Dr. Lynelle Doctor of pt CBG of 371 mg/dl

## 2017-06-14 NOTE — ED Notes (Signed)
Patient reports SI no plan and AH. Patient denies HI/VH. Plan of care discussed. Encouragement and support provided and safety maintain. Q 15 min safety checks remain in place and video monitoring.

## 2017-06-15 ENCOUNTER — Encounter (HOSPITAL_COMMUNITY): Payer: Self-pay | Admitting: *Deleted

## 2017-06-15 ENCOUNTER — Emergency Department (EMERGENCY_DEPARTMENT_HOSPITAL)
Admission: EM | Admit: 2017-06-15 | Discharge: 2017-06-16 | Disposition: A | Discharge status: Other Acute Inpatient Hospital | Payer: Medicaid Other | Source: Home / Self Care | Attending: Emergency Medicine | Admitting: Emergency Medicine

## 2017-06-15 DIAGNOSIS — R413 Other amnesia: Secondary | ICD-10-CM

## 2017-06-15 DIAGNOSIS — F329 Major depressive disorder, single episode, unspecified: Secondary | ICD-10-CM | POA: Diagnosis not present

## 2017-06-15 DIAGNOSIS — F419 Anxiety disorder, unspecified: Secondary | ICD-10-CM | POA: Diagnosis not present

## 2017-06-15 DIAGNOSIS — F17210 Nicotine dependence, cigarettes, uncomplicated: Secondary | ICD-10-CM

## 2017-06-15 DIAGNOSIS — R600 Localized edema: Secondary | ICD-10-CM | POA: Diagnosis not present

## 2017-06-15 DIAGNOSIS — J181 Lobar pneumonia, unspecified organism: Secondary | ICD-10-CM | POA: Diagnosis not present

## 2017-06-15 DIAGNOSIS — R739 Hyperglycemia, unspecified: Secondary | ICD-10-CM | POA: Diagnosis not present

## 2017-06-15 DIAGNOSIS — R45851 Suicidal ideations: Secondary | ICD-10-CM

## 2017-06-15 DIAGNOSIS — F32A Depression, unspecified: Secondary | ICD-10-CM

## 2017-06-15 DIAGNOSIS — R0602 Shortness of breath: Secondary | ICD-10-CM

## 2017-06-15 LAB — CBC
HCT: 42.4 % (ref 39.0–52.0)
HEMOGLOBIN: 14.4 g/dL (ref 13.0–17.0)
MCH: 27.3 pg (ref 26.0–34.0)
MCHC: 34 g/dL (ref 30.0–36.0)
MCV: 80.3 fL (ref 78.0–100.0)
PLATELETS: 258 10*3/uL (ref 150–400)
RBC: 5.28 MIL/uL (ref 4.22–5.81)
RDW: 13.4 % (ref 11.5–15.5)
WBC: 8.4 10*3/uL (ref 4.0–10.5)

## 2017-06-15 LAB — CBG MONITORING, ED
Glucose-Capillary: 218 mg/dL — ABNORMAL HIGH (ref 65–99)
Glucose-Capillary: 280 mg/dL — ABNORMAL HIGH (ref 65–99)

## 2017-06-15 LAB — COMPREHENSIVE METABOLIC PANEL
ALBUMIN: 3.7 g/dL (ref 3.5–5.0)
ALT: 20 U/L (ref 17–63)
AST: 21 U/L (ref 15–41)
Alkaline Phosphatase: 94 U/L (ref 38–126)
Anion gap: 12 (ref 5–15)
BUN: 21 mg/dL — ABNORMAL HIGH (ref 6–20)
CHLORIDE: 99 mmol/L — AB (ref 101–111)
CO2: 23 mmol/L (ref 22–32)
CREATININE: 1.44 mg/dL — AB (ref 0.61–1.24)
Calcium: 9.6 mg/dL (ref 8.9–10.3)
GFR calc non Af Amer: 53 mL/min — ABNORMAL LOW (ref >60)
GLUCOSE: 284 mg/dL — AB (ref 65–99)
Potassium: 3.9 mmol/L (ref 3.5–5.1)
SODIUM: 134 mmol/L — AB (ref 135–145)
Total Bilirubin: 0.7 mg/dL (ref 0.3–1.2)
Total Protein: 7.3 g/dL (ref 6.5–8.1)

## 2017-06-15 LAB — SALICYLATE LEVEL

## 2017-06-15 LAB — RAPID URINE DRUG SCREEN, HOSP PERFORMED
Amphetamines: NOT DETECTED
BARBITURATES: NOT DETECTED
BENZODIAZEPINES: NOT DETECTED
Cocaine: NOT DETECTED
Opiates: POSITIVE — AB
Tetrahydrocannabinol: NOT DETECTED

## 2017-06-15 LAB — ETHANOL: Alcohol, Ethyl (B): <10 mg/dL (ref <10)

## 2017-06-15 LAB — ACETAMINOPHEN LEVEL: Acetaminophen (Tylenol), Serum: <10 ug/mL — ABNORMAL LOW (ref 10–30)

## 2017-06-15 MED ORDER — INSULIN ASPART PROT & ASPART (70-30 MIX) 100 UNIT/ML ~~LOC~~ SUSP: 45.0000 [IU] | SUBCUTANEOUS | Status: DC

## 2017-06-15 MED ORDER — INSULIN ASPART PROT & ASPART (70-30 MIX) 100 UNIT/ML ~~LOC~~ SUSP
45.0000 [IU] | Freq: Every day | SUBCUTANEOUS | Status: DC
  Administered 2017-06-16: 45 [IU] via SUBCUTANEOUS

## 2017-06-15 MED ORDER — LEVOFLOXACIN 750 MG PO TABS
750.0000 mg | ORAL_TABLET | Freq: Every day | ORAL | Status: DC
  Administered 2017-06-16: 750 mg via ORAL
  Filled 2017-06-15: qty 1

## 2017-06-15 MED ORDER — METOPROLOL TARTRATE 25 MG PO TABS
25.0000 mg | ORAL_TABLET | Freq: Two times a day (BID) | ORAL | Status: DC
Start: 2017-06-15 — End: 2017-06-16
  Administered 2017-06-16 (×3): 25 mg via ORAL
  Filled 2017-06-15 (×3): qty 1

## 2017-06-15 MED ORDER — GABAPENTIN 300 MG PO CAPS
300.0000 mg | ORAL_CAPSULE | Freq: Two times a day (BID) | ORAL | Status: DC
  Administered 2017-06-16 (×3): 300 mg via ORAL
  Filled 2017-06-15 (×3): qty 1

## 2017-06-15 MED ORDER — ADULT MULTIVITAMIN W/MINERALS CH
1.0000 | ORAL_TABLET | Freq: Every day | ORAL | Status: DC
  Administered 2017-06-16: 1 via ORAL
  Filled 2017-06-15: qty 1

## 2017-06-15 MED ORDER — INSULIN ASPART PROT & ASPART (70-30 MIX) 100 UNIT/ML ~~LOC~~ SUSP
60.0000 [IU] | Freq: Every day | SUBCUTANEOUS | Status: DC
  Administered 2017-06-16: 60 [IU] via SUBCUTANEOUS
  Filled 2017-06-15: qty 10

## 2017-06-15 MED ORDER — ASPIRIN EC 81 MG PO TBEC
81.0000 mg | DELAYED_RELEASE_TABLET | Freq: Every day | ORAL | Status: DC
  Administered 2017-06-16: 81 mg via ORAL
  Filled 2017-06-15: qty 1

## 2017-06-15 MED ORDER — HYDROCHLOROTHIAZIDE 25 MG PO TABS
25.0000 mg | ORAL_TABLET | Freq: Every day | ORAL | Status: DC
  Administered 2017-06-16: 25 mg via ORAL
  Filled 2017-06-15: qty 1

## 2017-06-15 MED ORDER — ATORVASTATIN CALCIUM 40 MG PO TABS: 40.0000 mg | ORAL_TABLET | Freq: Every day | ORAL | Status: DC

## 2017-06-15 MED ORDER — ARIPIPRAZOLE 10 MG PO TABS
10.0000 mg | ORAL_TABLET | Freq: Every day | ORAL | Status: DC
  Administered 2017-06-16: 10 mg via ORAL
  Filled 2017-06-15: qty 1
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
## 2017-06-15 NOTE — Discharge Instructions (Signed)
For your ongoing mental health needs, you are advised to follow up with [facility].  New and returning patients are seen at their walk-in clinic.  Walk-in hours are Monday - Friday from 8:00 am - 3:00 pm.  Walk-in patients are seen on a first come, first served basis.  Try to arrive as early as possible for he best chance of being seen the same day: ° °     [facility] °     [address] °     [place], [place] [address] °     [phone] °

## 2017-06-15 NOTE — ED Provider Notes (Addendum)
[facility]-EMERGENCY DEPT Provider Note   CSN: [id number] Arrival date & time: 06/15/17  1824     History   Chief Complaint Chief Complaint  Patient presents with  . Suicidal    HPI Charles Orr is a 56 y.o. male.  HPI Patient presents with suicidal thoughts. Discharge from left [facility] earlier today for the same. States that he was not suicidal when he left but is now suicidal again. Reportedly has previous suicide attempt and states that he plans to jump off a bridge. History of bipolar disorder and schizophrenia. He is from [place] and has a psychiatrist down there that he sees. States he is now up here. Denies substance abuse. Denies chest pain. Patient is somewhat upset that he has to tell the story of what is going on again. Past Medical History:  Diagnosis Date  . Anxiety   . Arthritis    "back" (06/12/2017)  . Bipolar disorder (HCC)   . CAP (community acquired pneumonia) 06/10/2017  . Chronic lower back pain   . Degenerative disc disease, lumbar   . Depression   . Fibromyalgia   . GERD (gastroesophageal reflux disease)   . High cholesterol   . Hypertension   . Myocardial infarction (HCC) 2002  . Pneumonia 1989  . Schizophrenia (HCC)   . Type II diabetes mellitus [facility])     Patient Active Problem List   Diagnosis Date Noted  . Depression 06/15/2017  . Other schizophrenia (HCC)   . Pneumonia 06/11/2017  . Community acquired pneumonia of right lower lobe of lung (HCC)   . Hyperglycemia   . Localized edema   . Shortness of breath 06/10/2017    Past Surgical History:  Procedure Laterality Date  . CARDIAC CATHETERIZATION  12/2006   [name] 01/18/2011  . INGUINAL HERNIA REPAIR Right   . PARATHYROIDECTOMY    . PERICARDIAL TAP  2000   ?; in [place]/notes 01/18/2011       Home Medications    Prior to Admission medications   Medication Sig Start Date End Date Taking? Authorizing Provider  acetaminophen (TYLENOL) 325 MG tablet Take 1 tablet (325 mg  total) by mouth every 6 (six) hours as needed. Patient taking differently: Take 1,300 mg by mouth every 6 (six) hours as needed for mild pain.  06/13/17 07/13/17  Marthenia Rolling, DO  ARIPiprazole (ABILIFY) 10 MG tablet Take 1 tablet (10 mg total) by mouth daily. 06/13/17   Marthenia Rolling, DO  aspirin 81 MG EC tablet Take 1 tablet (81 mg total) by mouth daily. 06/13/17   Marthenia Rolling, DO  atorvastatin (LIPITOR) 40 MG tablet Take 1 tablet (40 mg total) by mouth daily. 06/13/17   Marthenia Rolling, DO  gabapentin (NEURONTIN) 300 MG capsule Take 1 capsule (300 mg total) by mouth 2 (two) times daily. 06/13/17   Marthenia Rolling, DO  hydrochlorothiazide (HYDRODIURIL) 25 MG tablet Take 1 tablet (25 mg total) by mouth daily. 06/13/17   Marthenia Rolling, DO  insulin aspart protamine- aspart (NOVOLOG MIX 70/30) (70-30) 100 UNIT/ML injection Inject 0.45-0.6 mLs (45-60 Units total) into the skin See admin instructions. Inject 60 units subcutaneously every morning and 45 units at night Patient taking differently: Inject 45-60 Units into the skin See admin instructions. Inject 65 units subcutaneously every morning and 45 units at night 06/13/17   Bland, Scott, DO  levofloxacin (LEVAQUIN) 750 MG tablet Take 1 tablet (750 mg total) by mouth daily. 06/13/17   Marthenia Rolling, DO  metoprolol tartrate (LOPRESSOR) 25 MG  tablet Take 1 tablet (25 mg total) by mouth 2 (two) times daily. 06/13/17   Marthenia Rolling, DO  Multiple Vitamin (MULTIVITAMIN WITH MINERALS) TABS tablet Take 1 tablet by mouth daily.    [provider]  polyethylene glycol (MIRALAX / GLYCOLAX) packet Take 17 g by mouth 2 (two) times daily. 06/13/17   Marthenia Rolling, DO    Family History No family history on file.  Social History Social History  Substance Use Topics  . Smoking status: Current Every Day Smoker    Packs/day: 0.50    Years: 38.00    Types: Cigarettes  . Smokeless tobacco: Current User    Types: Chew  . Alcohol use Yes     Comment: 06/12/2017  "stopped ~ 1 yr ago"      Allergies   Patient has no known allergies.   Review of Systems Review of Systems  Constitutional: Negative for appetite change.  HENT: Negative for congestion.   Respiratory: Negative for shortness of breath.   Cardiovascular: Negative for chest pain.  Gastrointestinal: Negative for abdominal pain.  Genitourinary: Negative for difficulty urinating.  Musculoskeletal: Negative for arthralgias.  Neurological: Negative for seizures.  Hematological: Negative for adenopathy.  Psychiatric/Behavioral: Positive for dysphoric mood and suicidal ideas. Negative for hallucinations.     Physical Exam Updated Vital Signs BP 128/87 (BP Location: Left Arm)   Pulse 87   Temp 98.2 F (36.8 C) (Oral)   Resp 20   Ht  (1.88 m)   Wt 121.6 kg (268 lb)   SpO2 97%   BMI 34.41 kg/m   Physical Exam  Constitutional: He appears well-developed.  HENT:  Head: Atraumatic.  Eyes: Pupils are equal, round, and reactive to light.  Neck: Neck supple.  Cardiovascular: Normal rate.   Pulmonary/Chest: Effort normal.  Abdominal: Soft. There is no tenderness.  Musculoskeletal: He exhibits no edema.  Neurological: He is alert.  Skin: Skin is warm. Capillary refill takes less than 2 seconds.  Psychiatric:  Does not appear to be responding to internal stimuli.     ED Treatments / Results  Labs (all labs ordered are listed, but only abnormal results are displayed) Labs Reviewed  COMPREHENSIVE METABOLIC PANEL - Abnormal; Notable for the following:       Result Value   Sodium 134 (*)    Chloride 99 (*)    Glucose, Bld 284 (*)    BUN 21 (*)    Creatinine, Ser 1.44 (*)    GFR calc non Af Amer 53 (*)    All other components within normal limits  ACETAMINOPHEN LEVEL - Abnormal; Notable for the following:    Acetaminophen (Tylenol), Serum <10 (*)    All other components within normal limits  RAPID URINE DRUG SCREEN, HOSP PERFORMED - Abnormal; Notable for the following:     Opiates POSITIVE (*)    All other components within normal limits  ETHANOL  SALICYLATE LEVEL  CBC    EKG  EKG Interpretation None       Radiology No results found.  Procedures Procedures (including critical care time)  Medications Ordered in ED Medications  ARIPiprazole (ABILIFY) tablet 10 mg (not administered)  aspirin EC tablet 81 mg (not administered)  atorvastatin (LIPITOR) tablet 40 mg (not administered)  gabapentin (NEURONTIN) capsule 300 mg (not administered)  hydrochlorothiazide (HYDRODIURIL) tablet 25 mg (not administered)  metoprolol tartrate (LOPRESSOR) tablet 25 mg (not administered)  multivitamin with minerals tablet 1 tablet (not administered)  levofloxacin (LEVAQUIN) tablet 750 mg (not administered)  insulin aspart protamine- aspart (NOVOLOG MIX 70/30) injection 60 Units (not administered)    And  insulin aspart protamine- aspart (NOVOLOG MIX 70/30) injection 45 Units (not administered)     Initial Impression / Assessment and Plan / ED Course  I have reviewed the triage vital signs and the nursing notes.  Pertinent labs & imaging results that were available during my care of the patient were reviewed by me and considered in my medical decision making (see chart for details).     Patient presents with suicidal ideations. Seen earlier today at [facility] for the same. Had initially told him he was suicidal but then later said he was not. Now states he is suicidal again. Medically cleared. To be seen by TTS.  Final Clinical Impressions(s) / ED Diagnoses   Final diagnoses:  Suicidal ideation    New Prescriptions New Prescriptions   No medications on file     Benjiman Core, MD 06/15/17 2339  TTS has seen patient and the NP reportedly was the patient evaluated in the morning.    Benjiman Core, MD 06/16/17 [phone]

## 2017-06-15 NOTE — ED Notes (Signed)
MD at bedside. 

## 2017-06-15 NOTE — ED Notes (Signed)
Pt ambulated to bathroom, gait steady and even.

## 2017-06-15 NOTE — ED Triage Notes (Signed)
Pt in c/o SI, pt states, "I will jump off a bridge." pt reports being homeless now, pt reports hx of SI attempt x 9 mths ago with cocaine overdose, pt c/o lower bil back pain, pt denies recent ETOH & drug use, pt calm & cooperative at this time,A&O x4

## 2017-06-15 NOTE — ED Notes (Signed)
TTS machine at bedside. 

## 2017-06-15 NOTE — ED Notes (Signed)
Regular Diet was ordered for patient. 

## 2017-06-15 NOTE — ED Notes (Signed)
Pt discharged home. Discharged instructions read to pt who verbalized understanding. All belongings returned to pt who signed for same. Pt is not not delusional and not responding to internal stimuli. Escorted pt to the ED exit.  Pt continues to be angry with Dr. Jannifer Franklin.

## 2017-06-15 NOTE — Progress Notes (Signed)
06/15/17 1324:  LRT went to pt room to offer activities, pt was sleep.   Caroll Rancher, LRT/CTRS

## 2017-06-15 NOTE — ED Notes (Signed)
Pt's belongings placed in LOCKER 6

## 2017-06-15 NOTE — [facility] Suicide Risk Assessment (Cosign Needed)
Suicide Risk Assessment  Discharge Assessment   [facility] Discharge Suicide Risk Assessment   Principal Problem: Depression Discharge Diagnoses:  Patient Active Problem List   Diagnosis Date Noted  . Depression [F32.9] 06/15/2017  . Other schizophrenia (HCC) [F20.89]   . Pneumonia [J18.9] 06/11/2017  . Community acquired pneumonia of right lower lobe of lung (HCC) [J18.1]   . Hyperglycemia [R73.9]   . Localized edema [R60.0]   . Shortness of breath [R06.02] 06/10/2017    Total Time spent with patient: 30 minutes  Musculoskeletal: Strength & Muscle Tone: within normal limits Gait & Station: normal Patient leans: N/A  Psychiatric Specialty Exam:   Blood pressure 122/74, pulse 68, temperature 98 F (36.7 C), temperature source Oral, resp. rate 18, SpO2 99 %.There is no height or weight on file to calculate BMI.  General Appearance: Casual  Eye Contact::  Good  Speech:  Clear and Coherent  Volume:  Normal  Mood:  Depressed  Affect:  Congruent  Thought Process:  Coherent  Orientation:  Full (Time, Place, and Person)  Thought Content:  Hallucinations: None  Suicidal Thoughts:  No  Homicidal Thoughts:  No  Memory:  Immediate;   Fair Recent;   Fair Remote;   Fair  Judgement:  Fair  Insight:  Fair  Psychomotor Activity:  Normal  Concentration:  Fair  Recall:  [employer] of Knowledge:Fair  Language: Fair  Akathisia:  No  Handed:  Right  AIMS (if indicated):     Assets:  Communication Skills Desire for Improvement Financial Resources/Insurance Social Support  Sleep:     Cognition: WNL  ADL's:  Intact   Mental Status Per Nursing Assessment::   On Admission:     Demographic Factors:  Male and Low socioeconomic status  Loss Factors: Financial problems/change in socioeconomic status  Historical Factors: Family history of mental illness or substance abuse  Risk Reduction Factors:   Positive social support  Continued Clinical Symptoms:  Depression:    Insomnia  Cognitive Features That Contribute To Risk:  Closed-mindedness    Suicide Risk:  Minimal: No identifiable suicidal ideation.  Patients presenting with no risk factors but with morbid ruminations; may be classified as minimal risk based on the severity of the depressive symptoms    Plan Of Care/Follow-up recommendations:  Activity:  as tolerated Diet:  heart healthy  Oneta Rack, NP 06/15/2017, 1:51 PM

## 2017-06-15 NOTE — [facility] Assessment (Signed)
[facility] Assessment Progress Note  Per Thedore Mins, MD, this pt does not require psychiatric hospitalization at this time.  Pt is to be discharged from [facility] with recommenation to follow up with [facility].  This has been included in pt's discharge instructions.  Pt's nurse, Diane, has been notified.  Doylene Canning, MA Triage Specialist [phone]

## 2017-06-15 NOTE — Consult Note (Signed)
[facility] Consult   Reason for Consult:  Depression Referring Physician:  EPD Patient Identification: Charles Orr MRN:  [id number] Principal Diagnosis: Depression Diagnosis:   Patient Active Problem List   Diagnosis Date Noted  . Depression [F32.9] 06/15/2017  . Other schizophrenia ([place]) [F20.89]   . Pneumonia [J18.9] 06/11/2017  . Community acquired pneumonia of right lower lobe of lung ([place]) [J18.1]   . Hyperglycemia [R73.9]   . Localized edema [R60.0]   . Shortness of breath [R06.02] 06/10/2017    Total Time spent with patient: 30 minutes  Subjective:   Charles Orr is a 56 y.o. 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 Seen resting in bedroom. Reports he feeling a little "rough" this morning, but other wise doing okay. Patient is requesting a bus pass at discharge. Denies suicidal or homicidal ideations.Support, encouragement and reassurance was provided.   HPI: per assessment note- Charles Orr is an 56 y.o. male with history of anxiety, depression, and Schizophrenia. He presents to [facility] with a complaint of suicidal ideations. Onset of suicidal ideations started 2 days ago. He denies a plan or intent. He has tried to commit suicide multiple times in the past by overdosing on drugs. Patient asked about the triggers for his suicidal ideations. He responded, "My life". Patient did not elaborate any further. He denies self mutilating behaviors. He denies a family history of mental health illness. Denies HI. Denies aggressive or assaultive behaviors. No legal issues reported. No AVH's. Patient does not appear to be responding to internal stimuli. He denies current alcohol an drug use. However, patient reports a history of substance use (cocaine/alcohol) last year. He has a history of INPT treatment. His last hospitalization was 8 months ago at [facility] in [place], [place].  He does not have a current outpatient therapist/psychiatrist. He sts that he should be  taking his medications (medical and psych) but has been noncompliant for the past 3 weeks. Patient was unable to report the names of his psychiatric medications.  Patient lives in [place]. Sts that he took a bus to [place] yesterday to find his son. Sts that he doesn't know where is sons exact location but know he is in [place] somewhere. Writer reviewed patient's chart and per history 06/12/2017 patient was admitted to [facility]. Patient did not mention this information during his TTS assessment today. The notes indicate that patient was traveling by bus from [place] to [place], [place] to visit his brother with Stage IV cancer. Per nursing notes, "He was on a four hour layover when he was admitted to the hospital. The patient has no belongings and things the bus line gave his bag to someone else. Patient told his nurse that he just wants to go home to [place] instead of going to [place]. CSW left a Office manager of social work. Will discuss possible assistance with transportation home when he returns call."   Past Psychiatric History:   Risk to Self: Suicidal Ideation: Yes-Currently Present Suicidal Intent: Yes-Currently Present Is patient at risk for suicide?: Yes Suicidal Plan?: No Access to Means: No What has been your use of drugs/alcohol within the last 12 months?:  (n/a) How many times?:  (multiple prior overdoses on drugs) Other Self Harm Risks:  (n/a) Triggers for Past Attempts: Other (Comment) ("Life") Intentional Self Injurious Behavior: None Risk to Others: Homicidal Ideation: No Thoughts of Harm to Others: No Current Homicidal Intent: No Current Homicidal Plan: No Access to Homicidal Means:  No Identified Victim:  (n/a) History of harm to others?: No Assessment of Violence: None Noted Violent Behavior Description:  (Patient is calm and cooperative ) Does patient have access to weapons?: No Criminal Charges Pending?: Yes Describe Pending Criminal  Charges:  (n/a) Does patient have a court date: No Prior Inpatient Therapy: Prior Inpatient Therapy: Yes Prior Therapy Dates:  (8 months ago ) Prior Therapy Facilty/Provider(s):  [facility] ) Reason for Treatment:  (non compliant of medications; SI; hearing voices) Prior Outpatient Therapy: Prior Outpatient Therapy: No Prior Therapy Dates:  (n/a) Prior Therapy Facilty/Provider(s):  (n/a) Reason for Treatment:  (n/a) Does patient have an ACCT team?: No Does patient have Intensive In-House Services?  : No Does patient have [facility] services? : No Does patient have P4CC services?: No  Past Medical History:  Past Medical History:  Diagnosis Date  . Anxiety   . Arthritis    "back" (06/12/2017)  . Bipolar disorder ([place])   . CAP (community acquired pneumonia) 06/10/2017  . Chronic lower back pain   . Degenerative disc disease, lumbar   . Depression   . Fibromyalgia   . GERD (gastroesophageal reflux disease)   . High cholesterol   . Hypertension   . Myocardial infarction ([place]) 2002  . Pneumonia 1989  . Schizophrenia ([place])   . Type II diabetes mellitus ([place])     Past Surgical History:  Procedure Laterality Date  . CARDIAC CATHETERIZATION  12/2006   Archie Endo 01/18/2011  . INGUINAL HERNIA REPAIR Right   . PARATHYROIDECTOMY    . PERICARDIAL TAP  2000   ?; in [place]/notes 01/18/2011   Family History: No family history on file. Family Psychiatric  History:  Social History:  History  Alcohol Use  . Yes    Comment: 06/12/2017 "stopped ~ 1 yr ago"     History  Drug Use    Comment: 06/12/2017 "stopped using cocaine ~ 1 yr ago; used marijuana when I was younger"    Social History   Social History  . Marital status: Divorced    Spouse name: N/A  . Number of children: N/A  . Years of education: N/A   Social History Main Topics  . Smoking status: Current Every Day Smoker    Packs/day: 0.50    Years: 38.00    Types: Cigarettes  . Smokeless tobacco: Current User     Types: Chew  . Alcohol use Yes     Comment: 06/12/2017 "stopped ~ 1 yr ago"  . Drug use: Yes     Comment: 06/12/2017 "stopped using cocaine ~ 1 yr ago; used marijuana when I was younger"  . Sexual activity: Not Currently   Other Topics Concern  . None   Social History Narrative  . None   Additional Social History:    Allergies:  No Known Allergies  Labs:  Results for orders placed or performed during the hospital encounter of 06/14/17 (from the past 48 hour(s))  Comprehensive metabolic panel     Status: Abnormal   Collection Time: 06/14/17  3:42 PM  Result Value Ref Range   Sodium 132 (L) 135 - 145 mmol/L   Potassium 4.2 3.5 - 5.1 mmol/L   Chloride 96 (L) 101 - 111 mmol/L   CO2 23 22 - 32 mmol/L   Glucose, Bld 363 (H) 65 - 99 mg/dL   BUN 27 (H) 6 - 20 mg/dL   Creatinine, Ser 1.69 (H) 0.61 - 1.24 mg/dL   Calcium 9.8 8.9 - 10.3 mg/dL  Total Protein 8.1 6.5 - 8.1 g/dL   Albumin 4.4 3.5 - 5.0 g/dL   AST 17 15 - 41 U/L   ALT 20 17 - 63 U/L   Alkaline Phosphatase 97 38 - 126 U/L   Total Bilirubin 0.6 0.3 - 1.2 mg/dL   GFR calc non Af Amer 44 (L) >60 mL/min   GFR calc Af Amer 51 (L) >60 mL/min    Comment: (NOTE) The eGFR has been calculated using the CKD EPI equation. This calculation has not been validated in all clinical situations. eGFR's persistently <60 mL/min signify possible Chronic Kidney Disease.    Anion gap 13 5 - 15  Ethanol     Status: None   Collection Time: 06/14/17  3:42 PM  Result Value Ref Range   Alcohol, Ethyl (B) <10 <10 mg/dL    Comment:        LOWEST DETECTABLE LIMIT FOR SERUM ALCOHOL IS 10 mg/dL FOR MEDICAL PURPOSES ONLY   Salicylate level     Status: None   Collection Time: 06/14/17  3:42 PM  Result Value Ref Range   Salicylate Lvl <0.9 2.8 - 30.0 mg/dL  Acetaminophen level     Status: Abnormal   Collection Time: 06/14/17  3:42 PM  Result Value Ref Range   Acetaminophen (Tylenol), Serum <10 (L) 10 - 30 ug/mL    Comment:         THERAPEUTIC CONCENTRATIONS VARY SIGNIFICANTLY. A RANGE OF 10-30 ug/mL MAY BE AN EFFECTIVE CONCENTRATION FOR MANY PATIENTS. HOWEVER, SOME ARE BEST TREATED AT CONCENTRATIONS OUTSIDE THIS RANGE. ACETAMINOPHEN CONCENTRATIONS >150 ug/mL AT 4 HOURS AFTER INGESTION AND >50 ug/mL AT 12 HOURS AFTER INGESTION ARE OFTEN ASSOCIATED WITH TOXIC REACTIONS.   cbc     Status: None   Collection Time: 06/14/17  3:42 PM  Result Value Ref Range   WBC 9.6 4.0 - 10.5 K/uL   RBC 5.52 4.22 - 5.81 MIL/uL   Hemoglobin 15.1 13.0 - 17.0 g/dL   HCT 44.3 39.0 - 52.0 %   MCV 80.3 78.0 - 100.0 fL   MCH 27.4 26.0 - 34.0 pg   MCHC 34.1 30.0 - 36.0 g/dL   RDW 13.4 11.5 - 15.5 %   Platelets 238 150 - 400 K/uL  CBG monitoring, ED     Status: Abnormal   Collection Time: 06/14/17  6:16 PM  Result Value Ref Range   Glucose-Capillary 371 (H) 65 - 99 mg/dL  Rapid urine drug screen (hospital performed)     Status: None   Collection Time: 06/14/17  6:44 PM  Result Value Ref Range   Opiates NONE DETECTED NONE DETECTED   Cocaine NONE DETECTED NONE DETECTED   Benzodiazepines NONE DETECTED NONE DETECTED   Amphetamines NONE DETECTED NONE DETECTED   Tetrahydrocannabinol NONE DETECTED NONE DETECTED   Barbiturates NONE DETECTED NONE DETECTED    Comment:        DRUG SCREEN FOR MEDICAL PURPOSES ONLY.  IF CONFIRMATION IS NEEDED FOR ANY PURPOSE, NOTIFY LAB WITHIN 5 DAYS.        LOWEST DETECTABLE LIMITS FOR URINE DRUG SCREEN Drug Class       Cutoff (ng/mL) Amphetamine      1000 Barbiturate      200 Benzodiazepine   983 Tricyclics       382 Opiates          300 Cocaine          300 THC  50   Urinalysis, Routine w reflex microscopic     Status: Abnormal   Collection Time: 06/14/17  6:44 PM  Result Value Ref Range   Color, Urine STRAW (A) YELLOW   APPearance CLEAR CLEAR   Specific Gravity, Urine 1.023 1.005 - 1.030   pH 5.0 5.0 - 8.0   Glucose, UA >=500 (A) NEGATIVE mg/dL   Hgb urine dipstick SMALL  (A) NEGATIVE   Bilirubin Urine NEGATIVE NEGATIVE   Ketones, ur NEGATIVE NEGATIVE mg/dL   Protein, ur NEGATIVE NEGATIVE mg/dL   Nitrite NEGATIVE NEGATIVE   Leukocytes, UA NEGATIVE NEGATIVE   RBC / HPF 0-5 0 - 5 RBC/hpf   WBC, UA 0-5 0 - 5 WBC/hpf   Bacteria, UA NONE SEEN NONE SEEN   Squamous Epithelial / LPF NONE SEEN NONE SEEN   Mucus PRESENT   POC CBG, ED     Status: Abnormal   Collection Time: 06/14/17  9:30 PM  Result Value Ref Range   Glucose-Capillary 338 (H) 65 - 99 mg/dL  CBG monitoring, ED     Status: Abnormal   Collection Time: 06/15/17  8:04 AM  Result Value Ref Range   Glucose-Capillary 218 (H) 65 - 99 mg/dL  CBG monitoring, ED     Status: Abnormal   Collection Time: 06/15/17 11:35 AM  Result Value Ref Range   Glucose-Capillary 280 (H) 65 - 99 mg/dL    Current Facility-Administered Medications  Medication Dose Route Frequency Provider Last Rate Last Dose  . acetaminophen (TYLENOL) tablet 650 mg  650 mg Oral Q6H PRN Jillyn Ledger, PA-C   650 mg at 06/15/17 1032  . ARIPiprazole (ABILIFY) tablet 10 mg  10 mg Oral Daily Jillyn Ledger, PA-C   10 mg at 06/15/17 1020  . aspirin EC tablet 81 mg  81 mg Oral Daily Jillyn Ledger, PA-C   81 mg at 06/15/17 1020  . atorvastatin (LIPITOR) tablet 40 mg  40 mg Oral Daily Jillyn Ledger, PA-C   40 mg at 06/15/17 1020  . gabapentin (NEURONTIN) capsule 300 mg  300 mg Oral BID Jillyn Ledger, PA-C   300 mg at 06/15/17 1019  . hydrochlorothiazide (HYDRODIURIL) tablet 25 mg  25 mg Oral Daily Jillyn Ledger, PA-C   25 mg at 06/15/17 1020  . insulin aspart (novoLOG) injection 0-15 Units  0-15 Units Subcutaneous TID WC Jillyn Ledger, PA-C   8 Units at 06/15/17 1244  . insulin aspart protamine- aspart (NOVOLOG MIX 70/30) injection 45 Units  45 Units Subcutaneous Q supper Dorie Rank, MD   45 Units at 06/14/17 1830  . insulin aspart protamine- aspart (NOVOLOG MIX 70/30) injection 65 Units  65 Units Subcutaneous Q breakfast  Dorie Rank, MD   65 Units at 06/15/17 [id number]  . levofloxacin (LEVAQUIN) tablet 750 mg  750 mg Oral Daily Jillyn Ledger, PA-C   750 mg at 06/15/17 1032  . metoprolol tartrate (LOPRESSOR) tablet 25 mg  25 mg Oral BID Jillyn Ledger, PA-C   25 mg at 06/15/17 1019  . multivitamin with minerals tablet 1 tablet  1 tablet Oral Daily Jillyn Ledger, PA-C   1 tablet at 06/15/17 1019  . ondansetron (ZOFRAN) tablet 4 mg  4 mg Oral Q8H PRN Maczis, Barth Kirks, PA-C      . polyethylene glycol (MIRALAX / GLYCOLAX) packet 17 g  17 g Oral BID PRN Jillyn Ledger, PA-C       Current Outpatient Prescriptions  Medication  Sig Dispense Refill  . acetaminophen (TYLENOL) 325 MG tablet Take 1 tablet (325 mg total) by mouth every 6 (six) hours as needed. (Patient taking differently: Take 1,300 mg by mouth every 6 (six) hours as needed for mild pain. ) 120 tablet 0  . ARIPiprazole (ABILIFY) 10 MG tablet Take 1 tablet (10 mg total) by mouth daily. 30 tablet 0  . aspirin 81 MG EC tablet Take 1 tablet (81 mg total) by mouth daily. 30 tablet 0  . atorvastatin (LIPITOR) 40 MG tablet Take 1 tablet (40 mg total) by mouth daily. 30 tablet 0  . gabapentin (NEURONTIN) 300 MG capsule Take 1 capsule (300 mg total) by mouth 2 (two) times daily. 60 capsule 0  . hydrochlorothiazide (HYDRODIURIL) 25 MG tablet Take 1 tablet (25 mg total) by mouth daily. 30 tablet 0  . insulin aspart protamine- aspart (NOVOLOG MIX 70/30) (70-30) 100 UNIT/ML injection Inject 0.45-0.6 mLs (45-60 Units total) into the skin See admin instructions. Inject 60 units subcutaneously every morning and 45 units at night (Patient taking differently: Inject 45-60 Units into the skin See admin instructions. Inject 65 units subcutaneously every morning and 45 units at night) 50 mL 0  . levofloxacin (LEVAQUIN) 750 MG tablet Take 1 tablet (750 mg total) by mouth daily. 4 tablet 0  . metoprolol tartrate (LOPRESSOR) 25 MG tablet Take 1 tablet (25 mg total) by mouth 2  (two) times daily. 60 tablet 0  . Multiple Vitamin (MULTIVITAMIN WITH MINERALS) TABS tablet Take 1 tablet by mouth daily.    . polyethylene glycol (MIRALAX / GLYCOLAX) packet Take 17 g by mouth 2 (two) times daily. 14 each 0    Musculoskeletal: Strength & Muscle Tone: within normal limits Gait & Station: normal Patient leans: N/A  Psychiatric Specialty Exam: Physical Exam  Vitals reviewed. Constitutional: He is oriented to person, place, and time. He appears well-developed.  Cardiovascular: Normal rate.   Neurological: He is alert and oriented to person, place, and time.  Psychiatric: He has a normal mood and affect. His behavior is normal.    Review of Systems  Psychiatric/Behavioral: Positive for depression (stable) and memory loss. Negative for substance abuse (denis currently, reports history).    Blood pressure 122/74, pulse 68, temperature 98 F (36.7 C), temperature source Oral, resp. rate 18, SpO2 99 %.There is no height or weight on file to calculate BMI.   General Appearance: Casual  Eye Contact::  Good  Speech:  Clear and Coherent  Volume:  Normal  Mood:  Depressed  Affect:  Congruent  Thought Process:  Coherent  Orientation:  Full (Time, Place, and Person)  Thought Content:  Hallucinations: None  Suicidal Thoughts:  No  Homicidal Thoughts:  No   Memory:  Immediate;   Fair Recent;   Fair Remote;   Fair  Judgement:  Fair  Insight:  Fair  Psychomotor Activity:  Normal  Concentration:  Fair  Recall:  [employer] of Knowledge:Fair  Language: Fair  Akathisia:  No  Handed:  Right  AIMS (if indicated):     Assets:  Communication Skills Desire for Improvement Financial Resources/Insurance Social Support  Sleep:     Cognition: WNL  ADL's:  Intact   Per Corena Pilgrim, MD, this pt does not require psychiatric hospitalization at this time.  Pt is to be discharged from [facility] with recommenation to follow up with [facility].  This has been included in pt's discharge  instructions.  Treatment Plan Summary: Daily contact with patient to assess  and evaluate symptoms and progress in treatment  Disposition: No evidence of imminent risk to self or others at present.   Patient does not meet criteria for psychiatric inpatient admission. Supportive therapy provided about ongoing stressors. Discussed crisis plan, support from social network, calling 911, coming to the Emergency Department, and calling Suicide Hotline.  Derrill Center, NP 06/15/2017 1:52 PM  Patient seen face-to-face for psychiatric evaluation, chart reviewed and case discussed with the physician extender and developed treatment plan. Reviewed the information documented and agree with the treatment plan. Corena Pilgrim, MD

## 2017-06-15 NOTE — ED Notes (Signed)
Pt expressed resentment towards Dr. Jannifer Franklin for discharging him. He was discharged from [facility] yesterday after treatment for pneumonia and felt that he now needs another admission for depression. During his stay in the ED he was calm and cooperative. His affect and mood seemed appropriate until Dr. Jannifer Franklin told him that he would be discharged with outpatient follow-up. He then became angry, but went back to his room and remained calm.

## 2017-06-16 ENCOUNTER — Encounter (HOSPITAL_COMMUNITY): Payer: Self-pay

## 2017-06-16 ENCOUNTER — Inpatient Hospital Stay (HOSPITAL_COMMUNITY)
Admission: AD | Admit: 2017-06-16 | Discharge: 2017-06-26 | DRG: 885 | Disposition: A | Discharge status: Home / Self Care | Payer: Medicaid Other | Source: Intra-hospital | Attending: Psychiatry | Admitting: Psychiatry

## 2017-06-16 DIAGNOSIS — R450 Nervousness: Secondary | ICD-10-CM

## 2017-06-16 DIAGNOSIS — F2089 Other schizophrenia: Secondary | ICD-10-CM | POA: Diagnosis present

## 2017-06-16 DIAGNOSIS — R4582 Worries: Secondary | ICD-10-CM | POA: Diagnosis not present

## 2017-06-16 DIAGNOSIS — E119 Type 2 diabetes mellitus without complications: Secondary | ICD-10-CM | POA: Diagnosis present

## 2017-06-16 DIAGNOSIS — F333 Major depressive disorder, recurrent, severe with psychotic symptoms: Secondary | ICD-10-CM | POA: Diagnosis not present

## 2017-06-16 DIAGNOSIS — Z7982 Long term (current) use of aspirin: Secondary | ICD-10-CM | POA: Diagnosis not present

## 2017-06-16 DIAGNOSIS — Z794 Long term (current) use of insulin: Secondary | ICD-10-CM

## 2017-06-16 DIAGNOSIS — R600 Localized edema: Secondary | ICD-10-CM | POA: Diagnosis not present

## 2017-06-16 DIAGNOSIS — F1411 Cocaine abuse, in remission: Secondary | ICD-10-CM | POA: Diagnosis not present

## 2017-06-16 DIAGNOSIS — I252 Old myocardial infarction: Secondary | ICD-10-CM | POA: Diagnosis not present

## 2017-06-16 DIAGNOSIS — Z590 Homelessness: Secondary | ICD-10-CM

## 2017-06-16 DIAGNOSIS — F22 Delusional disorders: Secondary | ICD-10-CM | POA: Diagnosis not present

## 2017-06-16 DIAGNOSIS — E7800 Pure hypercholesterolemia, unspecified: Secondary | ICD-10-CM | POA: Diagnosis present

## 2017-06-16 DIAGNOSIS — R0602 Shortness of breath: Secondary | ICD-10-CM | POA: Diagnosis not present

## 2017-06-16 DIAGNOSIS — I10 Essential (primary) hypertension: Secondary | ICD-10-CM | POA: Diagnosis present

## 2017-06-16 DIAGNOSIS — R443 Hallucinations, unspecified: Secondary | ICD-10-CM | POA: Diagnosis not present

## 2017-06-16 DIAGNOSIS — F250 Schizoaffective disorder, bipolar type: Secondary | ICD-10-CM | POA: Diagnosis present

## 2017-06-16 DIAGNOSIS — F1211 Cannabis abuse, in remission: Secondary | ICD-10-CM | POA: Diagnosis not present

## 2017-06-16 DIAGNOSIS — M2550 Pain in unspecified joint: Secondary | ICD-10-CM | POA: Diagnosis not present

## 2017-06-16 DIAGNOSIS — J181 Lobar pneumonia, unspecified organism: Secondary | ICD-10-CM | POA: Diagnosis not present

## 2017-06-16 DIAGNOSIS — F39 Unspecified mood [affective] disorder: Secondary | ICD-10-CM | POA: Diagnosis not present

## 2017-06-16 DIAGNOSIS — F1410 Cocaine abuse, uncomplicated: Secondary | ICD-10-CM | POA: Diagnosis not present

## 2017-06-16 DIAGNOSIS — G4700 Insomnia, unspecified: Secondary | ICD-10-CM

## 2017-06-16 DIAGNOSIS — Z79899 Other long term (current) drug therapy: Secondary | ICD-10-CM | POA: Diagnosis not present

## 2017-06-16 DIAGNOSIS — M549 Dorsalgia, unspecified: Secondary | ICD-10-CM | POA: Diagnosis not present

## 2017-06-16 DIAGNOSIS — F17210 Nicotine dependence, cigarettes, uncomplicated: Secondary | ICD-10-CM

## 2017-06-16 DIAGNOSIS — F1421 Cocaine dependence, in remission: Secondary | ICD-10-CM | POA: Diagnosis not present

## 2017-06-16 DIAGNOSIS — R440 Auditory hallucinations: Secondary | ICD-10-CM

## 2017-06-16 DIAGNOSIS — G8929 Other chronic pain: Secondary | ICD-10-CM | POA: Diagnosis present

## 2017-06-16 DIAGNOSIS — F1910 Other psychoactive substance abuse, uncomplicated: Secondary | ICD-10-CM

## 2017-06-16 DIAGNOSIS — F419 Anxiety disorder, unspecified: Secondary | ICD-10-CM | POA: Diagnosis not present

## 2017-06-16 DIAGNOSIS — F329 Major depressive disorder, single episode, unspecified: Secondary | ICD-10-CM | POA: Diagnosis not present

## 2017-06-16 DIAGNOSIS — R454 Irritability and anger: Secondary | ICD-10-CM | POA: Diagnosis not present

## 2017-06-16 DIAGNOSIS — R45851 Suicidal ideations: Secondary | ICD-10-CM | POA: Diagnosis present

## 2017-06-16 DIAGNOSIS — F319 Bipolar disorder, unspecified: Principal | ICD-10-CM | POA: Diagnosis present

## 2017-06-16 DIAGNOSIS — R739 Hyperglycemia, unspecified: Secondary | ICD-10-CM | POA: Diagnosis not present

## 2017-06-16 DIAGNOSIS — J189 Pneumonia, unspecified organism: Secondary | ICD-10-CM | POA: Diagnosis not present

## 2017-06-16 HISTORY — DX: Schizoaffective disorder, bipolar type: F25.0

## 2017-06-16 LAB — GLUCOSE, CAPILLARY: Glucose-Capillary: 371 mg/dL — ABNORMAL HIGH (ref 65–99)

## 2017-06-16 LAB — CBG MONITORING, ED
GLUCOSE-CAPILLARY: 325 mg/dL — AB (ref 65–99)
Glucose-Capillary: 366 mg/dL — ABNORMAL HIGH (ref 65–99)

## 2017-06-16 MED ORDER — HYDROXYZINE HCL 25 MG PO TABS
25.0000 mg | ORAL_TABLET | Freq: Three times a day (TID) | ORAL | Status: DC | PRN
  Administered 2017-06-16 – 2017-06-24 (×10): 25 mg via ORAL
  Filled 2017-06-16 (×5): qty 1
  Filled 2017-06-16: qty 10
  Filled 2017-06-16 (×5): qty 1

## 2017-06-16 MED ORDER — ASPIRIN EC 81 MG PO TBEC
81.0000 mg | DELAYED_RELEASE_TABLET | Freq: Every day | ORAL | Status: DC
  Administered 2017-06-17 – 2017-06-26 (×10): 81 mg via ORAL
  Filled 2017-06-16 (×11): qty 1
  Filled 2017-06-16: qty 7

## 2017-06-16 MED ORDER — ATORVASTATIN CALCIUM 40 MG PO TABS
40.0000 mg | ORAL_TABLET | Freq: Every day | ORAL | Status: DC
  Administered 2017-06-17 – 2017-06-25 (×9): 40 mg via ORAL
  Filled 2017-06-16 (×7): qty 1
  Filled 2017-06-16: qty 7
  Filled 2017-06-16 (×3): qty 1

## 2017-06-16 MED ORDER — INSULIN ASPART PROT & ASPART (70-30 MIX) 100 UNIT/ML ~~LOC~~ SUSP
60.0000 [IU] | Freq: Every day | SUBCUTANEOUS | Status: DC
  Administered 2017-06-17 – 2017-06-26 (×10): 60 [IU] via SUBCUTANEOUS
  Filled 2017-06-16: qty 0.6

## 2017-06-16 MED ORDER — TRAZODONE HCL 50 MG PO TABS
50.0000 mg | ORAL_TABLET | Freq: Every evening | ORAL | Status: DC | PRN
  Administered 2017-06-16: 50 mg via ORAL
  Filled 2017-06-16: qty 1

## 2017-06-16 MED ORDER — METOPROLOL TARTRATE 25 MG PO TABS
25.0000 mg | ORAL_TABLET | Freq: Two times a day (BID) | ORAL | Status: DC
  Administered 2017-06-17 – 2017-06-26 (×19): 25 mg via ORAL
  Filled 2017-06-16: qty 1
  Filled 2017-06-16: qty 14
  Filled 2017-06-16 (×2): qty 1
  Filled 2017-06-16: qty 14
  Filled 2017-06-16 (×18): qty 1

## 2017-06-16 MED ORDER — INSULIN ASPART 100 UNIT/ML ~~LOC~~ SOLN
0.0000 [IU] | Freq: Three times a day (TID) | SUBCUTANEOUS | Status: DC
  Administered 2017-06-17: 9 [IU] via SUBCUTANEOUS
  Administered 2017-06-17: 3 [IU] via SUBCUTANEOUS

## 2017-06-16 MED ORDER — ARIPIPRAZOLE 10 MG PO TABS
10.0000 mg | ORAL_TABLET | Freq: Every day | ORAL | Status: DC
  Administered 2017-06-17 – 2017-06-18 (×2): 10 mg via ORAL
  Filled 2017-06-16 (×3): qty 1

## 2017-06-16 MED ORDER — NICOTINE 14 MG/24HR TD PT24
14.0000 mg | MEDICATED_PATCH | Freq: Every day | TRANSDERMAL | Status: DC
  Administered 2017-06-17 – 2017-06-26 (×11): 14 mg via TRANSDERMAL
  Filled 2017-06-16 (×13): qty 1

## 2017-06-16 MED ORDER — INSULIN ASPART 100 UNIT/ML ~~LOC~~ SOLN
0.0000 [IU] | Freq: Every day | SUBCUTANEOUS | Status: DC
  Administered 2017-06-16: 5 [IU] via SUBCUTANEOUS
  Administered 2017-06-17 – 2017-06-21 (×5): 4 [IU] via SUBCUTANEOUS
  Administered 2017-06-22: 3 [IU] via SUBCUTANEOUS
  Administered 2017-06-23: 4 [IU] via SUBCUTANEOUS
  Administered 2017-06-24: 2 [IU] via SUBCUTANEOUS
  Administered 2017-06-25: 3 [IU] via SUBCUTANEOUS

## 2017-06-16 MED ORDER — HYDROCHLOROTHIAZIDE 25 MG PO TABS
25.0000 mg | ORAL_TABLET | Freq: Every day | ORAL | Status: DC
  Administered 2017-06-17 – 2017-06-26 (×10): 25 mg via ORAL
  Filled 2017-06-16 (×9): qty 1
  Filled 2017-06-16: qty 7
  Filled 2017-06-16 (×2): qty 1

## 2017-06-16 MED ORDER — LEVOFLOXACIN 750 MG PO TABS
750.0000 mg | ORAL_TABLET | Freq: Every day | ORAL | Status: DC
  Administered 2017-06-17 – 2017-06-25 (×9): 750 mg via ORAL
  Filled 2017-06-16 (×3): qty 1
  Filled 2017-06-16: qty 3
  Filled 2017-06-16 (×8): qty 1

## 2017-06-16 MED ORDER — GABAPENTIN 300 MG PO CAPS
300.0000 mg | ORAL_CAPSULE | Freq: Two times a day (BID) | ORAL | Status: DC
  Administered 2017-06-17 – 2017-06-26 (×19): 300 mg via ORAL
  Filled 2017-06-16 (×17): qty 1
  Filled 2017-06-16: qty 14
  Filled 2017-06-16: qty 1
  Filled 2017-06-16: qty 14
  Filled 2017-06-16 (×3): qty 1

## 2017-06-16 MED ORDER — INSULIN ASPART PROT & ASPART (70-30 MIX) 100 UNIT/ML ~~LOC~~ SUSP
45.0000 [IU] | Freq: Every day | SUBCUTANEOUS | Status: DC
  Administered 2017-06-17 – 2017-06-25 (×9): 45 [IU] via SUBCUTANEOUS

## 2017-06-16 MED ORDER — ATORVASTATIN CALCIUM 40 MG PO TABS
40.0000 mg | ORAL_TABLET | Freq: Every day | ORAL | Status: DC
  Administered 2017-06-16: 40 mg via ORAL
  Filled 2017-06-16: qty 1

## 2017-06-16 MED ORDER — ADULT MULTIVITAMIN W/MINERALS CH
1.0000 | ORAL_TABLET | Freq: Every day | ORAL | Status: DC
  Administered 2017-06-17 – 2017-06-26 (×10): 1 via ORAL
  Filled 2017-06-16 (×3): qty 1
  Filled 2017-06-16: qty 7
  Filled 2017-06-16 (×8): qty 1

## 2017-06-16 MED ORDER — POLYETHYLENE GLYCOL 3350 17 G PO PACK
17.0000 g | PACK | Freq: Two times a day (BID) | ORAL | Status: DC
  Administered 2017-06-16: 17 g via ORAL
  Filled 2017-06-16: qty 1

## 2017-06-16 NOTE — ED Notes (Signed)
Breakfast tray given. °

## 2017-06-16 NOTE — Consult Note (Signed)
Telepsych Consultation   Reason for Consult: Patient Suicide and homicide ideations Referring Physician: Davonna Belling, MD Location of Patient: [facility] ED Location of Provider: [facility]  Patient Identification: [facility] MRN:  [id number] Principal Diagnosis: <principal problem not specified> Diagnosis:   Patient Active Problem List   Diagnosis Date Noted  . Depression [F32.9] 06/15/2017  . Other schizophrenia ([place]) [F20.89]   . Pneumonia [J18.9] 06/11/2017  . Community acquired pneumonia of right lower lobe of lung ([place]) [J18.1]   . Hyperglycemia [R73.9]   . Localized edema [R60.0]   . Shortness of breath [R06.02] 06/10/2017    Total Time spent with patient: 30 minutes  Subjective:   Charles Orr is a 56 y.o. male patient admitted with Major Depressive Disorder, Recurrent, Severe With Psychotic Features.  HPI: Per the TTS assessment completed on 06/16/17 Charles [facility]r is an 56 y.o. divorced male who presents unaccompanied to [facility] ED reporting symptoms of depression including suicidal ideation. Pt presented at [name] ED with symptoms of depression and was psychiatrically cleared earlier today by Dr. Darleene Cleaver and discharged. Pt says he is hearing voices telling him to kill himself and he has a plan to jump from a bridge. Pt reports he has attempted suicide in the past by overdose. Pt acknowledges symptoms including crying spells, social withdrawal, loss of interest in usual pleasures, fatigue, irritability, decreased concentration, decreased sleep, decreased appetite and feelings of guilt and hopelessness. He says he has experienced auditory hallucinations in the past. Pt denies current homicidal ideation or history of violence. Pt reports he has abused alcohol and cocaine in the past but has not used in one year.  Pt identifies his primary stressor as homelessness. Patient lives in [place]. Sts that he took a bus to [place] yesterday to find  his son. Sts that he doesn't know where is sons exact location but know he is in [place] somewhere. Writer reviewed patient's chart and per history 06/12/2017 patient was admitted to [facility]. Patient did not mention this information during his TTS assessment today. The notes indicate that patient was traveling by bus from [place] to [place], [place] to visit his brother with Stage IV cancer. Per nursing notes, "He was on a four hour layover when he was admitted to the hospital. The patient has no belongings and things the bus line gave his bag to someone else. Patient told his nurse thathe just wants to go home to [place] instead of going to [place]. CSW left a Office manager of social work. Will discuss possible assistance with transportation home when he returns call."   Pt is dressed in hospital scrubs, alert and oriented x4. Pt speaks in a clear tone, at normal volume and pace. Motor behavior appears normal. Eye contact is good. Pt's mood is depressed and affect is congruent with mood. Thought process is coherent and relevant. There is no indication Pt is currently responding to internal stimuli or experiencing delusional thought content. Pt says he is willing to sign voluntarily into a psychiatric facility.  On Exam: Patient was seen via tele-psych, chart reviewed with treatment team. Patient in bed, awake, alert and oriented x4. Patient reiterated the reason for this hospital admission as documented above. Patient stated, "I'm doing so so. Patient stating that he does not feel safe to go home. He said he is here for suicide ideations, he reported having thoughts about wanting to stop his car and jump off the bridge yesterday. Patient stating  that he lost his home 3 weeks ago. He sating that he is looking for another place to go. He stated that he has no family to go to. His younger brother has stage 4 cancer. Patient stated that he moved from [place] and is here to look for his  son whom he's unsure of his whereabouts. Patient endorsing both visual and auditory hallucination of voice telling him to hurt himself and seeing images. Patient stated that he takes his Abilify as schedule but does not believe it is working. Patient states that he used to be on Risperdal and that worked better. Patient is unable to contract for safety and is overwhelmed with depression.   Past Psychiatric History: As in H&P  Risk to Self: Suicidal Ideation: Yes-Currently Present Suicidal Intent: Yes-Currently Present Is patient at risk for suicide?: Yes Suicidal Plan?: Yes-Currently Present Specify Current Suicidal Plan: Pt reports current plan to jump from a bridge Access to Means: Yes Specify Access to Suicidal Means: Pt has access to bridges What has been your use of drugs/alcohol within the last 12 months?: Pt denies How many times?: 3 Other Self Harm Risks: None Triggers for Past Attempts: Unpredictable Intentional Self Injurious Behavior: None Risk to Others: Homicidal Ideation: No Thoughts of Harm to Others: No Current Homicidal Intent: No Current Homicidal Plan: No Access to Homicidal Means: No Identified Victim: None History of harm to others?: No Assessment of Violence: None Noted Violent Behavior Description: Pt denies history of violence Does patient have access to weapons?: No Criminal Charges Pending?: No Describe Pending Criminal Charges: NA Does patient have a court date: No Prior Inpatient Therapy: Prior Inpatient Therapy: Yes Prior Therapy Dates: 10/2016 Prior Therapy Facilty/Provider(s): [facility] Reason for Treatment: non compliant of medications; SI; hearing voices Prior Outpatient Therapy: Prior Outpatient Therapy: No Prior Therapy Dates: NA Prior Therapy Facilty/Provider(s): NA Reason for Treatment: NA Does patient have an ACCT team?: No Does patient have Intensive In-House Services?  : No Does patient have [facility] services? : No Does  patient have P4CC services?: No  Past Medical History:  Past Medical History:  Diagnosis Date  . Anxiety   . Arthritis    "back" (06/12/2017)  . Bipolar disorder ([place])   . CAP (community acquired pneumonia) 06/10/2017  . Chronic lower back pain   . Degenerative disc disease, lumbar   . Depression   . Fibromyalgia   . GERD (gastroesophageal reflux disease)   . High cholesterol   . Hypertension   . Myocardial infarction ([employer]) 2002  . Pneumonia 1989  . Schizophrenia ([place])   . Type II diabetes mellitus ([place])     Past Surgical History:  Procedure Laterality Date  . CARDIAC CATHETERIZATION  12/2006   Archie Endo 01/18/2011  . INGUINAL HERNIA REPAIR Right   . PARATHYROIDECTOMY    . PERICARDIAL TAP  2000   ?; in [place]/notes 01/18/2011   Family History: No family history on file.   Family Psychiatric  History: Unknown  Social History:  History  Alcohol Use  . Yes    Comment: 06/12/2017 "stopped ~ 1 yr ago"      History  Drug Use    Comment: 06/12/2017 "stopped using cocaine ~ 1 yr ago; used marijuana when I was younger" last cocaine use 9 mths ago per pt on 06/15/17    Social History   Social History  . Marital status: Divorced    Spouse name: N/A  . Number of children: N/A  . Years of education: N/A  Social History Main Topics  . Smoking status: Current Every Day Smoker    Packs/day: 0.50    Years: 38.00    Types: Cigarettes  . Smokeless tobacco: Current User    Types: Chew  . Alcohol use Yes     Comment: 06/12/2017 "stopped ~ 1 yr ago"   . Drug use: Yes     Comment: 06/12/2017 "stopped using cocaine ~ 1 yr ago; used marijuana when I was younger" last cocaine use 9 mths ago per pt on 06/15/17  . Sexual activity: Not Currently   Other Topics Concern  . None   Social History Narrative  . None   Additional Social History:    Allergies:  No Known Allergies  Labs:  Results for orders placed or performed during the hospital encounter of 06/15/17 (from the past  48 hour(s))  Comprehensive metabolic panel     Status: Abnormal   Collection Time: 06/15/17  6:55 PM  Result Value Ref Range   Sodium 134 (L) 135 - 145 mmol/L   Potassium 3.9 3.5 - 5.1 mmol/L   Chloride 99 (L) 101 - 111 mmol/L   CO2 23 22 - 32 mmol/L   Glucose, Bld 284 (H) 65 - 99 mg/dL   BUN 21 (H) 6 - 20 mg/dL   Creatinine, Ser 1.44 (H) 0.61 - 1.24 mg/dL   Calcium 9.6 8.9 - 10.3 mg/dL   Total Protein 7.3 6.5 - 8.1 g/dL   Albumin 3.7 3.5 - 5.0 g/dL   AST 21 15 - 41 U/L   ALT 20 17 - 63 U/L   Alkaline Phosphatase 94 38 - 126 U/L   Total Bilirubin 0.7 0.3 - 1.2 mg/dL   GFR calc non Af Amer 53 (L) >60 mL/min   GFR calc Af Amer >60 >60 mL/min    Comment: (NOTE) The eGFR has been calculated using the CKD EPI equation. This calculation has not been validated in all clinical situations. eGFR's persistently <60 mL/min signify possible Chronic Kidney Disease.    Anion gap 12 5 - 15  Ethanol     Status: None   Collection Time: 06/15/17  6:55 PM  Result Value Ref Range   Alcohol, Ethyl (B) <10 <10 mg/dL    Comment:        LOWEST DETECTABLE LIMIT FOR SERUM ALCOHOL IS 10 mg/dL FOR MEDICAL PURPOSES ONLY   Salicylate level     Status: None   Collection Time: 06/15/17  6:55 PM  Result Value Ref Range   Salicylate Lvl <9.7 2.8 - 30.0 mg/dL  Acetaminophen level     Status: Abnormal   Collection Time: 06/15/17  6:55 PM  Result Value Ref Range   Acetaminophen (Tylenol), Serum <10 (L) 10 - 30 ug/mL    Comment:        THERAPEUTIC CONCENTRATIONS VARY SIGNIFICANTLY. A RANGE OF 10-30 ug/mL MAY BE AN EFFECTIVE CONCENTRATION FOR MANY PATIENTS. HOWEVER, SOME ARE BEST TREATED AT CONCENTRATIONS OUTSIDE THIS RANGE. ACETAMINOPHEN CONCENTRATIONS >150 ug/mL AT 4 HOURS AFTER INGESTION AND >50 ug/mL AT 12 HOURS AFTER INGESTION ARE OFTEN ASSOCIATED WITH TOXIC REACTIONS.   cbc     Status: None   Collection Time: 06/15/17  6:55 PM  Result Value Ref Range   WBC 8.4 4.0 - 10.5 K/uL   RBC 5.28  4.22 - 5.81 MIL/uL   Hemoglobin 14.4 13.0 - 17.0 g/dL   HCT 42.4 39.0 - 52.0 %   MCV 80.3 78.0 - 100.0 fL   MCH 27.3 26.0 -  34.0 pg   MCHC 34.0 30.0 - 36.0 g/dL   RDW 13.4 11.5 - 15.5 %   Platelets 258 150 - 400 K/uL  Rapid urine drug screen (hospital performed)     Status: Abnormal   Collection Time: 06/15/17  7:11 PM  Result Value Ref Range   Opiates POSITIVE (A) NONE DETECTED   Cocaine NONE DETECTED NONE DETECTED   Benzodiazepines NONE DETECTED NONE DETECTED   Amphetamines NONE DETECTED NONE DETECTED   Tetrahydrocannabinol NONE DETECTED NONE DETECTED   Barbiturates NONE DETECTED NONE DETECTED    Comment:        DRUG SCREEN FOR MEDICAL PURPOSES ONLY.  IF CONFIRMATION IS NEEDED FOR ANY PURPOSE, NOTIFY LAB WITHIN 5 DAYS.        LOWEST DETECTABLE LIMITS FOR URINE DRUG SCREEN Drug Class       Cutoff (ng/mL) Amphetamine      1000 Barbiturate      200 Benzodiazepine   962 Tricyclics       836 Opiates          300 Cocaine          300 THC              50   CBG monitoring, ED     Status: Abnormal   Collection Time: 06/16/17  8:12 AM  Result Value Ref Range   Glucose-Capillary 366 (H) 65 - 99 mg/dL    Medications:  Current Facility-Administered Medications  Medication Dose Route Frequency Provider Last Rate Last Dose  . ARIPiprazole (ABILIFY) tablet 10 mg  10 mg Oral Daily Davonna Belling, MD      . aspirin EC tablet 81 mg  81 mg Oral Daily Davonna Belling, MD      . atorvastatin (LIPITOR) tablet 40 mg  40 mg Oral Daily Davonna Belling, MD      . gabapentin (NEURONTIN) capsule 300 mg  300 mg Oral BID Davonna Belling, MD   300 mg at 06/16/17 0016  . hydrochlorothiazide (HYDRODIURIL) tablet 25 mg  25 mg Oral Daily Davonna Belling, MD      . insulin aspart protamine- aspart (NOVOLOG MIX 70/30) injection 60 Units  60 Units Subcutaneous Q breakfast Davonna Belling, MD   60 Units at 06/16/17 6294   And  . insulin aspart protamine- aspart (NOVOLOG MIX 70/30) injection  45 Units  45 Units Subcutaneous Q supper Davonna Belling, MD      . levofloxacin [facility]) tablet 750 mg  750 mg Oral Daily Davonna Belling, MD      . metoprolol tartrate (LOPRESSOR) tablet 25 mg  25 mg Oral BID Davonna Belling, MD   25 mg at 06/16/17 0016  . multivitamin with minerals tablet 1 tablet  1 tablet Oral Daily Davonna Belling, MD       Current Outpatient Prescriptions  Medication Sig Dispense Refill  . acetaminophen (TYLENOL) 325 MG tablet Take 1 tablet (325 mg total) by mouth every 6 (six) hours as needed. (Patient taking differently: Take 1,300 mg by mouth every 6 (six) hours as needed for mild pain. ) 120 tablet 0  . ARIPiprazole (ABILIFY) 10 MG tablet Take 1 tablet (10 mg total) by mouth daily. 30 tablet 0  . aspirin 81 MG EC tablet Take 1 tablet (81 mg total) by mouth daily. 30 tablet 0  . atorvastatin (LIPITOR) 40 MG tablet Take 1 tablet (40 mg total) by mouth daily. 30 tablet 0  . gabapentin (NEURONTIN) 300 MG capsule Take 1 capsule (300 mg  total) by mouth 2 (two) times daily. 60 capsule 0  . hydrochlorothiazide (HYDRODIURIL) 25 MG tablet Take 1 tablet (25 mg total) by mouth daily. 30 tablet 0  . insulin aspart protamine- aspart (NOVOLOG MIX 70/30) (70-30) 100 UNIT/ML injection Inject 0.45-0.6 mLs (45-60 Units total) into the skin See admin instructions. Inject 60 units subcutaneously every morning and 45 units at night (Patient taking differently: Inject 45-60 Units into the skin See admin instructions. Inject 65 units subcutaneously every morning and 45 units at night) 50 mL 0  . levofloxacin (LEVAQUIN) 750 MG tablet Take 1 tablet (750 mg total) by mouth daily. 4 tablet 0  . metoprolol tartrate (LOPRESSOR) 25 MG tablet Take 1 tablet (25 mg total) by mouth 2 (two) times daily. 60 tablet 0  . Multiple Vitamin (MULTIVITAMIN WITH MINERALS) TABS tablet Take 1 tablet by mouth daily.    . polyethylene glycol (MIRALAX / GLYCOLAX) packet Take 17 g by mouth 2 (two) times daily.  14 each 0    Musculoskeletal: UTA via camera  Psychiatric Specialty Exam: Physical Exam  Nursing note and vitals reviewed.   Review of Systems  Psychiatric/Behavioral: Positive for depression, hallucinations, substance abuse and suicidal ideas. Negative for memory loss. The patient is nervous/anxious and has insomnia.   All other systems reviewed and are negative.   Blood pressure 133/71, pulse 67, temperature 98.7 F (37.1 C), temperature source Oral, resp. rate 18, height 6' 2"  (1.88 m), weight 121.6 kg (268 lb), SpO2 98 %.Body mass index is 34.41 kg/m.  General Appearance: on hospital scrub  Eye Contact:  Good  Speech:  Clear and Coherent and Normal Rate  Volume:  Normal  Mood:  Anxious and Depressed  Affect:  Depressed  Thought Process:  Coherent and Goal Directed  Orientation:  Full (Time, Place, and Person)  Thought Content:  WDL and Logical  Suicidal Thoughts:  not at the moment, but will not contract for safety if discharged  Homicidal Thoughts:  No  Memory:  Immediate;   Good Recent;   Good Remote;   Fair  Judgement:  Intact  Insight:  Present  Psychomotor Activity:  Normal  Concentration:  Concentration: Good and Attention Span: Good  Recall:  Good  Fund of Knowledge:  Good  Language:  Good  Akathisia:  Negative  Handed:  Right  AIMS (if indicated):     Assets:  Communication Skills Desire for Improvement  ADL's:  Intact  Cognition:  WNL  Sleep:      Patient's case discussed with Dr. Dwyane Dee with the following recommendations:  Treatment Plan Summary: Patient is unable to contract for safety Daily contact with patient to assess and evaluate symptoms and progress in treatment, Medication management and Plan for geropsych inpatient admission for medication management and stabilization  EPD to stabilize patient's blood sugar prior to transfer   Disposition: Recommend psychiatric Inpatient admission when medically cleared.  This service was provided via  telemedicine using a 2-way, interactive audio and video technology.  Names of all persons participating in this telemedicine service and their role in this encounter. Name: Jodie Echevaria Role: Patient  Name: Kanna Dafoe A. Julieana Eshleman  Role: NP           Vicenta Aly, NP 06/16/2017 10:27 AM

## 2017-06-16 NOTE — Tx Team (Signed)
Initial Treatment Plan 06/16/2017 11:44 PM [facility] ZOX:[id number]    PATIENT STRESSORS: Financial difficulties Medication change or noncompliance   PATIENT STRENGTHS: General fund of knowledge Motivation for treatment/growth Supportive family/friends   PATIENT IDENTIFIED PROBLEMS: Risk for suicide  Psychosis(AH- command)  depression  Place to stay  "want help with a place to stay and coping skills"             DISCHARGE CRITERIA:  Adequate post-discharge living arrangements Improved stabilization in mood, thinking, and/or behavior Verbal commitment to aftercare and medication compliance  PRELIMINARY DISCHARGE PLAN: Attend aftercare/continuing care group Outpatient therapy  PATIENT/FAMILY INVOLVEMENT: This treatment plan has been presented to and reviewed with the patient, [facility].  The patient and family have been given the opportunity to ask questions and make suggestions.  Delos Haring, RN 06/16/2017, 11:44 PM

## 2017-06-16 NOTE — BHH Counselor (Addendum)
Pt accepted to bed 507-2. Accepting: Leighton Ruff, NP. Attending: Dr. Jama Flavors. He can arrive after 9:00pm. CSW informed Becky, RN of disposition.   Daisy Floro Tran Arzuaga MSW, LCSW 06/16/2017 2:12 PM

## 2017-06-16 NOTE — ED Notes (Signed)
Dr Effie Shy aware pt c/o constipation x 3 days w/abd pain d/t "I need to have a BM" - order received for Miralax BID until has BM.

## 2017-06-16 NOTE — ED Notes (Addendum)
Lying on bed watching tv. Dinner order received. Pt aware and voiced agreement w/tx plan - accepted to [facility] 507-2 - Dr Jama Flavors - may arrive after 2100.

## 2017-06-16 NOTE — ED Notes (Signed)
Valuables taken to security, rest of belongings in locker in pod F.

## 2017-06-16 NOTE — ED Notes (Signed)
Attempted to call report, no answer on the unit

## 2017-06-16 NOTE — ED Notes (Signed)
Telepsych being performed. 

## 2017-06-16 NOTE — B[facility] Counselor (Signed)
Leighton Ruff, NP recommends gerospych inpatient admission. CSW faxed referrals to [facility], [place], [address] [place], [facility], [place], [place] and [facility] for inpatient admission.   Daisy Floro Verlena Marlette MSW, LCSW  06/16/2017 11:43 AM

## 2017-06-16 NOTE — ED Notes (Signed)
Returned from shower - Sitter w/pt. 

## 2017-06-16 NOTE — Progress Notes (Signed)
Patient ID: [facility], male   DOB: 1961/01/01, 56 y.o.   MRN: [id number]   Admission Note:  D: 56 yr male who presents VC in no acute distress for the treatment of SI and Depression. Pt appears flat and depressed. Pt was calm and cooperative with admission process. Pt presents with passive SI, AH- command  and contracts for safety upon admission. Pt denies AH . Pt stated he had Pneumonia last week and was starting to have increased feelings of depression that had been going on for 1 week prior, but was D/C from the hospital then the feelings increased , so he went to [facility] , but was treated rude by the doctor there and was D/C , so he went to [facility] expressing his desire for help with his SI/ AH. Pt stated he did good because usually he turns to drugs/ Etoh, but pt has been clean over 1 year. Pt stated he is a little hard of hearing, but can understand most things. Pt CBG was 371 on admission and pt was given 5 u Novolog .   A:Skin was assessed and found to be clear of any abnormal marks apart from excessive dry skin on feet, old scabs from shingles . PT searched and no contraband found, POC and unit policies explained and understanding verbalized. Consents obtained.   Food and fluids offered, and  Accepted.  R:  Pt had no additional questions or concerns.

## 2017-06-16 NOTE — ED Notes (Addendum)
States is supposed to have Insulin 70/30 - 60 units this am.

## 2017-06-16 NOTE — ED Notes (Signed)
Discussed w/ pt transfer to [facility], he agreed, signed consent and release of info which were faxed to [facility].  Requested provider complete EMTALA.

## 2017-06-16 NOTE — ED Notes (Signed)
Shower supplies given - Pt ambulatory to shower w/Sitter.

## 2017-06-17 DIAGNOSIS — R454 Irritability and anger: Secondary | ICD-10-CM

## 2017-06-17 DIAGNOSIS — F22 Delusional disorders: Secondary | ICD-10-CM

## 2017-06-17 DIAGNOSIS — R45851 Suicidal ideations: Secondary | ICD-10-CM

## 2017-06-17 DIAGNOSIS — Z590 Homelessness: Secondary | ICD-10-CM

## 2017-06-17 DIAGNOSIS — F319 Bipolar disorder, unspecified: Principal | ICD-10-CM

## 2017-06-17 DIAGNOSIS — R600 Localized edema: Secondary | ICD-10-CM

## 2017-06-17 DIAGNOSIS — R450 Nervousness: Secondary | ICD-10-CM

## 2017-06-17 DIAGNOSIS — R739 Hyperglycemia, unspecified: Secondary | ICD-10-CM

## 2017-06-17 DIAGNOSIS — R0602 Shortness of breath: Secondary | ICD-10-CM

## 2017-06-17 DIAGNOSIS — R440 Auditory hallucinations: Secondary | ICD-10-CM

## 2017-06-17 DIAGNOSIS — R4582 Worries: Secondary | ICD-10-CM

## 2017-06-17 DIAGNOSIS — J181 Lobar pneumonia, unspecified organism: Secondary | ICD-10-CM

## 2017-06-17 DIAGNOSIS — F419 Anxiety disorder, unspecified: Secondary | ICD-10-CM

## 2017-06-17 LAB — GLUCOSE, CAPILLARY
GLUCOSE-CAPILLARY: 143 mg/dL — AB (ref 65–99)
GLUCOSE-CAPILLARY: 240 mg/dL — AB (ref 65–99)
Glucose-Capillary: 365 mg/dL — ABNORMAL HIGH (ref 65–99)
Glucose-Capillary: 329 mg/dL — ABNORMAL HIGH (ref 65–99)

## 2017-06-17 MED ORDER — MAGNESIUM HYDROXIDE 400 MG/5ML PO SUSP
30.0000 mL | Freq: Once | ORAL | Status: AC
  Administered 2017-06-17: 30 mL via ORAL

## 2017-06-17 MED ORDER — TRAZODONE HCL 100 MG PO TABS
100.0000 mg | ORAL_TABLET | Freq: Every evening | ORAL | Status: DC | PRN
  Administered 2017-06-17 – 2017-06-25 (×9): 100 mg via ORAL
  Filled 2017-06-17 (×2): qty 1
  Filled 2017-06-17: qty 7
  Filled 2017-06-17 (×6): qty 1

## 2017-06-17 MED ORDER — ACETAMINOPHEN 325 MG PO TABS
650.0000 mg | ORAL_TABLET | Freq: Four times a day (QID) | ORAL | Status: DC | PRN
  Administered 2017-06-17 – 2017-06-25 (×9): 650 mg via ORAL
  Filled 2017-06-17 (×8): qty 2

## 2017-06-17 MED ORDER — INSULIN ASPART 100 UNIT/ML ~~LOC~~ SOLN
0.0000 [IU] | Freq: Three times a day (TID) | SUBCUTANEOUS | Status: DC
  Administered 2017-06-18: 15 [IU] via SUBCUTANEOUS
  Administered 2017-06-18: 5 [IU] via SUBCUTANEOUS
  Administered 2017-06-18: 8 [IU] via SUBCUTANEOUS
  Administered 2017-06-19: 11 [IU] via SUBCUTANEOUS
  Administered 2017-06-19: 8 [IU] via SUBCUTANEOUS
  Administered 2017-06-19: 5 [IU] via SUBCUTANEOUS
  Administered 2017-06-20: 3 [IU] via SUBCUTANEOUS
  Administered 2017-06-20: 5 [IU] via SUBCUTANEOUS
  Administered 2017-06-20: 11 [IU] via SUBCUTANEOUS
  Administered 2017-06-21: 3 [IU] via SUBCUTANEOUS
  Administered 2017-06-21: 15 [IU] via SUBCUTANEOUS
  Administered 2017-06-21: 5 [IU] via SUBCUTANEOUS
  Administered 2017-06-22 (×2): 3 [IU] via SUBCUTANEOUS
  Administered 2017-06-22: 8 [IU] via SUBCUTANEOUS
  Administered 2017-06-23: 5 [IU] via SUBCUTANEOUS
  Administered 2017-06-23: 11 [IU] via SUBCUTANEOUS
  Administered 2017-06-23: 5 [IU] via SUBCUTANEOUS
  Administered 2017-06-24 (×2): 3 [IU] via SUBCUTANEOUS
  Administered 2017-06-24: 11 [IU] via SUBCUTANEOUS
  Administered 2017-06-25: 15 [IU] via SUBCUTANEOUS
  Administered 2017-06-25 (×2): 3 [IU] via SUBCUTANEOUS
  Administered 2017-06-26: 5 [IU] via SUBCUTANEOUS
  Administered 2017-06-26: 2 [IU] via SUBCUTANEOUS

## 2017-06-17 NOTE — Progress Notes (Signed)
D:  Patient's self inventory sheet, patient has fair sleep, sleep medication not helpful.  Fair appetite, normal energy level, good concentration.  Rated depression and hopeless 6, anxiety 7.  Denied withdrawals.  Denied SI.  Physical problems, back, worst pain #8 in past 24 hours, lower back.  Pain medication is helpful.  Goal is housing.  Plans to do research.  No discharge plans. A:  Medications administered per MD orders.  Emotional support and encouragement given patient. R:  Denied SI and HI, contracts for safety.  State he does see images.  Voices tell him to hurt himself.  Safety maintained with 15 minute checks.

## 2017-06-17 NOTE — Progress Notes (Signed)
Psychoeducational Group Note  Date:  06/17/2017 Time: 2400  Group Topic/Focus:  Wrap-Up Group:   The focus of this group is to help patients review their daily goal of treatment and discuss progress on daily workbooks.  Participation Level: Did Not Attend  Participation Quality:  Not Applicable  Affect:  Not Applicable  Cognitive:  Not Applicable  Insight:  Not Applicable  Engagement in Group: Not Applicable  Additional Comments:  The patient did not attend group since he was admitted to the hallway after the group concluded.   Brionna Romanek S 06/17/2017, 12:00 AM

## 2017-06-17 NOTE — Progress Notes (Signed)
D: Pt passive SI/ HI, contracts for safety. Pt is pleasant and cooperative. Pt stated he was feeling a little better  A: Pt was offered support and encouragement. Pt was given scheduled medications. Pt was encourage to attend groups. Q 15 minute checks were done for safety.   R:Pt attends groups and interacts well with peers and staff. Pt is taking medication. Pt has no complaints.Pt receptive to treatment and safety maintained on unit.
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
## 2017-06-17 NOTE — [facility] Suicide Risk Assessment (Signed)
[facility] Admission Suicide Risk Assessment   Nursing information obtained from:    Demographic factors:    Current Mental Status:    Loss Factors:    Historical Factors:    Risk Reduction Factors:     Total Time spent with patient: 30 minutes Principal Problem: Bipolar disorder with psychotic features [facility]) Diagnosis:   Patient Active Problem List   Diagnosis Date Noted  . Bipolar disorder with psychotic features (HCC) [F31.9] 06/16/2017  . Depression [F32.9] 06/15/2017  . Other schizophrenia (HCC) [F20.89]   . Pneumonia [J18.9] 06/11/2017  . Community acquired pneumonia of right lower lobe of lung (HCC) [J18.1]   . Hyperglycemia [R73.9]   . Localized edema [R60.0]   . Shortness of breath [R06.02] 06/10/2017   Subjective Data:  56 y.o AAM, single, homeless, recently moved from [place]. Background history of Schizophrenia vs Bipolar Disorder, SUD but has not been abusing substances lately. Presented to the ER voluntarily . Reports command auditory hallucination to harm himself.  routine labs shows poor glucose control. UDS positive for opiates.  BAL<5 mg/dl. Has been on Abilify 10 mg daily for a long time. Says it has not helped him lately. Has been hallucinating in the past three weeks. Distressed by auditory hallucinations. Has been contemplating suicide. No specific plans. No intent. Wants to get better. No violent thoughts towards others. No homicidal thoughts. No access to weapons. We have agreed to optimize his Abilify.    Continued Clinical Symptoms:  Alcohol Use Disorder Identification Test Final Score (AUDIT): 0 The "Alcohol Use Disorders Identification Test", Guidelines for Use in Primary Care, Second Edition.  World Science writer [facility]). Score between 0-7:  no or low risk or alcohol related problems. Score between 8-15:  moderate risk of alcohol related problems. Score between 16-19:  high risk of alcohol related problems. Score 20 or above:  warrants further  diagnostic evaluation for alcohol dependence and treatment.   CLINICAL FACTORS:   Schizophrenia:   Command hallucinatons   Musculoskeletal: Strength & Muscle Tone: within normal limits Gait & Station: normal Patient leans: N/A  Psychiatric Specialty Exam: Physical Exam  Constitutional: He is oriented to person, place, and time. He appears well-developed and well-nourished.  HENT:  Head: Normocephalic and atraumatic.  Respiratory: Effort normal.  Neurological: He is alert and oriented to person, place, and time.  Skin: Skin is warm and dry.  Psychiatric:  As above    ROS  Blood pressure 109/69, pulse 94, temperature 99.6 F (37.6 C), temperature source Oral, resp. rate 18, height  (1.88 m), weight 117 kg (258 lb).Body mass index is 33.13 kg/m.  General Appearance: Overweight, not in any distress. Not internally distressed. Pleasant, polite and cooperative.   Eye Contact:  Good  Speech:  Clear and Coherent and Normal Rate  Volume:  Normal  Mood:  Overwhelmed by his subjective experience  Affect:  Restricted  Thought Process:  Linear  Orientation:  Full (Time, Place, and Person)  Thought Content:  Reports auditory hallucination.   Suicidal Thoughts:  Yes.  without intent/plan  Homicidal Thoughts:  No  Memory:  Did not assess at this time  Judgement:  Fair  Insight:  Good  Psychomotor Activity:  Normal  Concentration:  Fair  Recall:  Did not assess at this time.  Fund of Knowledge:  Fair  Language:  Fair  Akathisia:  Negative  Handed:    AIMS (if indicated):     Assets:  Desire for Improvement Resilience  ADL's:  Impaired  Cognition:  WNL  Sleep:  Number of Hours: 6      COGNITIVE FEATURES THAT CONTRIBUTE TO RISK:  None    SUICIDE RISK:   Moderate:  Frequent suicidal ideation with limited intensity, and duration, some specificity in terms of plans, no associated intent, good self-control, limited dysphoria/symptomatology, some risk factors present, and  identifiable protective factors, including available and accessible social support.  PLAN OF CARE:  1. Suicide precautions 2. Increase Abilify to 20 mg daily 3. Monitor mood, behavior and interactions with peers  I certify that inpatient services furnished can reasonably be expected to improve the patient's condition.   Georgiann Cocker, MD 06/17/2017, 3:49 PM

## 2017-06-17 NOTE — [facility] Suicide Risk Assessment (Signed)
[facility] INPATIENT:  Family/Significant Other Suicide Prevention Education  Suicide Prevention Education:  Patient Refusal for Family/Significant Other Suicide Prevention Education: The patient Charles Orr has refused to provide written consent for family/significant other to be provided Family/Significant Other Suicide Prevention Education during admission and/or prior to discharge.  Physician notified.  Patient states he does not want to burden family.  He states he has no access to guns.  [name] 06/17/2017, 4:19 PM

## 2017-06-17 NOTE — H&P (Signed)
Psychiatric Admission Assessment Adult  Patient Identification: Charles Orr MRN:  [id number] Date of Evaluation:  06/17/2017 Chief Complaint:  Bipolar disorder AH Principal Diagnosis: Bipolar disorder with psychotic features [facility]) Diagnosis:   Patient Active Problem List   Diagnosis Date Noted  . Bipolar disorder with psychotic features (HCC) [F31.9] 06/16/2017  . Depression [F32.9] 06/15/2017  . Other schizophrenia (HCC) [F20.89]   . Pneumonia [J18.9] 06/11/2017  . Community acquired pneumonia of right lower lobe of lung (HCC) [J18.1]   . Hyperglycemia [R73.9]   . Localized edema [R60.0]   . Shortness of breath [R06.02] 06/10/2017   History of Present Illness: Per assessment note-  Charles Orr is an 56 y.o. divorced male who presents unaccompanied to [facility] ED reporting symptoms of depression including suicidal ideation. Pt presented at [facility] ED with symptoms of depression and was psychiatrically cleared earlier today by Dr. Jannifer Franklin and discharged. Pt says he is hearing voices telling him to kill himself and he has a plan to jump from a bridge. Pt reports he has attempted suicide in the past by overdose. Pt acknowledges symptoms including crying spells, social withdrawal, loss of interest in usual pleasures, fatigue, irritability, decreased concentration, decreased sleep, decreased appetite and feelings of guilt and hopelessness. He says he has experienced auditory hallucinations in the past. Pt denies current homicidal ideation or history of violence. Pt reports he has abused alcohol and cocaine in the past but has not used in one year.  Pt identifies his primary stressor as homelessness. Patient lives in [place]. Sts that he took a bus to [place] yesterday to find his son. Sts that he doesn't know where is sons exact location but know he is in [place] somewhere. Writer reviewed patient's chart and per history 06/12/2017 patient was admitted to [facility]. Patient did  not mention this information during his TTS assessment today. The notes indicate that patient was traveling by bus from [place] to [place], [place] to visit his brother with Stage IV cancer. Per nursing notes, "He was on a four hour layover when he was admitted to the hospital. The patient has no belongings and things the bus line gave his bag to someone else. Patient told his nurse thathe just wants to go home to [place] instead of going to [place]. CSW left a Hospital doctor of social work. Will discuss possible assistance with transportation home when he returns call."    Associated Signs/Symptoms: Depression Symptoms:  depressed mood, difficulty concentrating, hopelessness, suicidal thoughts with specific plan, (Hypo) Manic Symptoms:  Distractibility, Irritable Mood, Anxiety Symptoms:  Excessive Worry, Psychotic Symptoms:  Paranoia, PTSD Symptoms: Avoidance:  Decreased Interest/Participation Total Time spent with patient: 30 minutes  Past Psychiatric History:   Is the patient at risk to self? Yes.    Has the patient been a risk to self in the past 6 months? Yes.    Has the patient been a risk to self within the distant past? Yes.    Is the patient a risk to others? No.  Has the patient been a risk to others in the past 6 months? No.  Has the patient been a risk to others within the distant past? No.   Prior Inpatient Therapy:   Prior Outpatient Therapy:    Alcohol Screening: 1. How often do you have a drink containing alcohol?: Never 9. Have you or someone else been injured as a result of your drinking?: No 10. Has a relative or friend or a doctor or  another Medical laboratory scientific officer been concerned about your drinking or suggested you cut down?: No Alcohol Use Disorder Identification Test Final Score (AUDIT): 0 Brief Intervention: AUDIT score less than 7 or less-screening does not suggest unhealthy drinking-brief intervention not indicated Substance Abuse History in the last  12 months:  Yes.   Consequences of Substance Abuse: NA Previous Psychotropic Medications: YES/ Psychological Evaluations: YES Past Medical History:  Past Medical History:  Diagnosis Date  . Anxiety   . Arthritis    "back" (06/12/2017)  . Bipolar disorder (HCC)   . CAP (community acquired pneumonia) 06/10/2017  . Chronic lower back pain   . Degenerative disc disease, lumbar   . Depression   . Fibromyalgia   . GERD (gastroesophageal reflux disease)   . High cholesterol   . Hypertension   . Myocardial infarction (HCC) 2002  . Pneumonia 1989  . Schizophrenia (HCC)   . Type II diabetes mellitus (HCC)     Past Surgical History:  Procedure Laterality Date  . CARDIAC CATHETERIZATION  12/2006   [name] 01/18/2011  . INGUINAL HERNIA REPAIR Right   . PARATHYROIDECTOMY    . PERICARDIAL TAP  2000   ?; in [place]/notes 01/18/2011   Family History: History reviewed. No pertinent family history. Family Psychiatric  History:  Tobacco Screening:   Social History:  History  Alcohol Use  . Yes    Comment: 06/12/2017 "stopped ~ 1 yr ago"      History  Drug Use  . Types: Cocaine    Comment: 06/12/2017 "stopped using cocaine ~ 1 yr ago; used marijuana when I was younger" last cocaine use 9 mths ago per pt on 06/15/17    Additional Social History:                           Allergies:  No Known Allergies Lab Results:  Results for orders placed or performed during the hospital encounter of 06/16/17 (from the past 48 hour(s))  Glucose, capillary     Status: Abnormal   Collection Time: 06/16/17 10:28 PM  Result Value Ref Range   Glucose-Capillary 371 (H) 65 - 99 mg/dL   Comment 1 Notify RN   Glucose, capillary     Status: Abnormal   Collection Time: 06/17/17  6:02 AM  Result Value Ref Range   Glucose-Capillary 143 (H) 65 - 99 mg/dL    Blood Alcohol level:  Lab Results  Component Value Date   ETH <10 06/15/2017   ETH <10 06/14/2017    Metabolic Disorder Labs:  Lab  Results  Component Value Date   HGBA1C 9.5 (H) 06/10/2017   MPG 225.95 06/10/2017   No results found for: PROLACTIN Lab Results  Component Value Date   CHOL 85 06/10/2017   TRIG 101 06/10/2017   HDL 22 (L) 06/10/2017   CHOLHDL 3.9 06/10/2017   VLDL 20 06/10/2017   LDLCALC 43 06/10/2017    Current Medications: Current Facility-Administered Medications  Medication Dose Route Frequency Provider Last Rate Last Dose  . ARIPiprazole (ABILIFY) tablet 10 mg  10 mg Oral Daily Okonkwo, Justina A, NP   10 mg at 06/17/17 0850  . aspirin EC tablet 81 mg  81 mg Oral Daily Okonkwo, Justina A, NP   81 mg at 06/17/17 0850  . atorvastatin (LIPITOR) tablet 40 mg  40 mg Oral q1800 Okonkwo, Justina A, NP      . gabapentin (NEURONTIN) capsule 300 mg  300 mg Oral BID Beryle Lathe, Justina  A, NP   300 mg at 06/17/17 0850  . hydrochlorothiazide (HYDRODIURIL) tablet 25 mg  25 mg Oral Daily Okonkwo, Justina A, NP   25 mg at 06/17/17 0850  . hydrOXYzine (ATARAX/VISTARIL) tablet 25 mg  25 mg Oral TID PRN Beryle Lathe, Justina A, NP   25 mg at 06/17/17 0857  . insulin aspart (novoLOG) injection 0-5 Units  0-5 Units Subcutaneous QHS Nira Conn A, NP   5 Units at 06/16/17 2316  . insulin aspart (novoLOG) injection 0-9 Units  0-9 Units Subcutaneous TID WC Nira Conn A, NP      . insulin aspart protamine- aspart (NOVOLOG MIX 70/30) injection 60 Units  60 Units Subcutaneous Q breakfast Okonkwo, Justina A, NP   60 Units at 06/17/17 0848   And  . insulin aspart protamine- aspart (NOVOLOG MIX 70/30) injection 45 Units  45 Units Subcutaneous Q supper Okonkwo, Justina A, NP      . levofloxacin (LEVAQUIN) tablet 750 mg  750 mg Oral Daily Okonkwo, Justina A, NP   750 mg at 06/17/17 0851  . metoprolol tartrate (LOPRESSOR) tablet 25 mg  25 mg Oral BID Okonkwo, Justina A, NP   25 mg at 06/17/17 0851  . multivitamin with minerals tablet 1 tablet  1 tablet Oral Daily Okonkwo, Justina A, NP   1 tablet at 06/17/17 0851  . nicotine  (NICODERM CQ - dosed in mg/24 hours) patch 14 mg  14 mg Transdermal Daily Cobos, Rockey Situ, MD   14 mg at 06/17/17 0851  . traZODone (DESYREL) tablet 50 mg  50 mg Oral QHS PRN Okonkwo, Justina A, NP   50 mg at 06/16/17 2315   PTA Medications: Prescriptions Prior to Admission  Medication Sig Dispense Refill Last Dose  . acetaminophen (TYLENOL) 325 MG tablet Take 1 tablet (325 mg total) by mouth every 6 (six) hours as needed. (Patient taking differently: Take 1,300 mg by mouth every 6 (six) hours as needed for mild pain. ) 120 tablet 0 06/14/2017 at Unknown time  . ARIPiprazole (ABILIFY) 10 MG tablet Take 1 tablet (10 mg total) by mouth daily. 30 tablet 0 06/13/2017 at Unknown time  . aspirin 81 MG EC tablet Take 1 tablet (81 mg total) by mouth daily. 30 tablet 0 06/13/2017 at Unknown time  . atorvastatin (LIPITOR) 40 MG tablet Take 1 tablet (40 mg total) by mouth daily. 30 tablet 0 06/13/2017 at Unknown time  . gabapentin (NEURONTIN) 300 MG capsule Take 1 capsule (300 mg total) by mouth 2 (two) times daily. 60 capsule 0 06/13/2017 at Unknown time  . hydrochlorothiazide (HYDRODIURIL) 25 MG tablet Take 1 tablet (25 mg total) by mouth daily. 30 tablet 0 06/13/2017 at Unknown time  . insulin aspart protamine- aspart (NOVOLOG MIX 70/30) (70-30) 100 UNIT/ML injection Inject 0.45-0.6 mLs (45-60 Units total) into the skin See admin instructions. Inject 60 units subcutaneously every morning and 45 units at night (Patient taking differently: Inject 45-60 Units into the skin See admin instructions. Inject 65 units subcutaneously every morning and 45 units at night) 50 mL 0 06/14/2017 at Unknown time  . levofloxacin (LEVAQUIN) 750 MG tablet Take 1 tablet (750 mg total) by mouth daily. 4 tablet 0 06/13/2017 at Unknown time  . metoprolol tartrate (LOPRESSOR) 25 MG tablet Take 1 tablet (25 mg total) by mouth 2 (two) times daily. 60 tablet 0 06/13/2017 at 1500  . Multiple Vitamin (MULTIVITAMIN WITH MINERALS) TABS  tablet Take 1 tablet by mouth daily.   06/13/2017 at Unknown  time  . polyethylene glycol (MIRALAX / GLYCOLAX) packet Take 17 g by mouth 2 (two) times daily. 14 each 0 06/13/2017 at Unknown time    Musculoskeletal: Strength & Muscle Tone: within normal limits Gait & Station: normal Patient leans: N/A  Psychiatric Specialty Exam: Physical Exam  Vitals reviewed. Constitutional: He is oriented to person, place, and time. He appears well-developed.  Cardiovascular: Normal rate.   Neurological: He is alert and oriented to person, place, and time.  Psychiatric: He has a normal mood and affect. His behavior is normal.    Review of Systems  Psychiatric/Behavioral: Positive for depression and suicidal ideas. The patient is nervous/anxious.     Blood pressure 102/67, pulse 91, temperature 99.6 F (37.6 C), temperature source Oral, resp. rate 18, height  (1.88 m), weight 117 kg (258 lb).Body mass index is 33.13 kg/m.  General Appearance: Casual  Eye Contact:  Fair  Speech:  Clear and Coherent  Volume:  Normal  Mood:  Depressed  Affect:  Appropriate  Thought Process:  Coherent  Orientation:  Full (Time, Place, and Person)  Thought Content:  Rumination  Suicidal Thoughts:  Yes.  with intent/plan  Homicidal Thoughts:  No  Memory:  Immediate;   Fair  Judgement:  Fair  Insight:  Fair  Psychomotor Activity:  Normal  Concentration:  Concentration: Fair  Recall:  [employer] of Knowledge:  Fair  Language:  Good  Akathisia:  No  Handed:  Right  AIMS (if indicated):     Assets:  Financial Resources/Insurance  ADL's:  Intact  Cognition:  WNL  Sleep:  Number of Hours: 6    Treatment Plan Summary: Daily contact with patient to assess and evaluate symptoms and progress in treatment and Medication management  Observation Level/Precautions:  15 minute checks  Laboratory:  CBC Chemistry Profile HbAIC UDS UA  Psychotherapy:  Individual and group session  Medications:  See SRA by MD   Consultations:  Psychiatry and CSW  Discharge Concerns:  Safety, stabilization, and risk of access to medication and medication stabilization   Estimated LOS: 5-7days  Other:     Physician Treatment Plan for Primary Diagnosis: Bipolar disorder with psychotic features (HCC) Long Term Goal(s): Improvement in symptoms so as ready for discharge  Short Term Goals: Ability to identify changes in lifestyle to reduce recurrence of condition will improve, Ability to disclose and discuss suicidal ideas and Ability to demonstrate self-control will improve  Physician Treatment Plan for Secondary Diagnosis: Principal Problem:   Bipolar disorder with psychotic features (HCC)  Long Term Goal(s): Improvement in symptoms so as ready for discharge  Short Term Goals: Ability to identify changes in lifestyle to reduce recurrence of condition will improve, Ability to verbalize feelings will improve, Ability to maintain clinical measurements within normal limits will improve and Compliance with prescribed medications will improve  I certify that inpatient services furnished can reasonably be expected to improve the patient's condition.    Oneta Rack, NP 10/14/201811:47 AM

## 2017-06-17 NOTE — Plan of Care (Signed)
Problem: Coping: Goal: Ability to cope will improve Outcome: Progressing Nurse discussed depression/anxiety/coping skills with patient.    

## 2017-06-17 NOTE — [facility] Group Notes (Signed)
[facility] LCSW Group Therapy Note  Date/Time:  06/17/2017  11:00AM-12:00PM  Type of Therapy and Topic:  Group Therapy:  Music and Mood  Participation Level:  Active   Description of Group: In this process group, members listened to a variety of genres of music and identified that different types of music evoke different responses.  Patients were encouraged to identify music that was soothing for them and music that was energizing for them.  Patients discussed how this knowledge can help with wellness and recovery in various ways including managing depression and anxiety as well as encouraging healthy sleep habits.    Therapeutic Goals: 1. Patients will explore the impact of different varieties of music on mood 2. Patients will verbalize the thoughts they have when listening to different types of music 3. Patients will identify music that is soothing to them as well as music that is energizing to them 4. Patients will discuss how to use this knowledge to assist in maintaining wellness and recovery 5. Patients will explore the use of music as a coping skill  Summary of Patient Progress:  At the beginning of group, patient expressed he was "hanging in there" and at the end of group said he had enjoyed the music and was "okay."  Therapeutic Modalities: Solution Focused Brief Therapy Motivational Interviewing Activity   Charles Dentler, LCSW 06/17/2017 8:29 AM

## 2017-06-18 DIAGNOSIS — F39 Unspecified mood [affective] disorder: Secondary | ICD-10-CM

## 2017-06-18 DIAGNOSIS — F1211 Cannabis abuse, in remission: Secondary | ICD-10-CM

## 2017-06-18 DIAGNOSIS — R443 Hallucinations, unspecified: Secondary | ICD-10-CM

## 2017-06-18 DIAGNOSIS — F17210 Nicotine dependence, cigarettes, uncomplicated: Secondary | ICD-10-CM

## 2017-06-18 DIAGNOSIS — F1411 Cocaine abuse, in remission: Secondary | ICD-10-CM

## 2017-06-18 DIAGNOSIS — G4700 Insomnia, unspecified: Secondary | ICD-10-CM

## 2017-06-18 LAB — GLUCOSE, CAPILLARY
GLUCOSE-CAPILLARY: 281 mg/dL — AB (ref 65–99)
Glucose-Capillary: 219 mg/dL — ABNORMAL HIGH (ref 65–99)
Glucose-Capillary: 382 mg/dL — ABNORMAL HIGH (ref 65–99)
Glucose-Capillary: 334 mg/dL — ABNORMAL HIGH (ref 65–99)

## 2017-06-18 MED ORDER — LIDOCAINE 5 % EX PTCH
1.0000 | MEDICATED_PATCH | Freq: Every day | CUTANEOUS | Status: DC
  Administered 2017-06-18 – 2017-06-23 (×6): 1 via TRANSDERMAL
  Filled 2017-06-18 (×7): qty 1
  Filled 2017-06-18: qty 7
  Filled 2017-06-18 (×4): qty 1

## 2017-06-18 MED ORDER — RISPERIDONE 1 MG PO TABS
1.0000 mg | ORAL_TABLET | Freq: Two times a day (BID) | ORAL | Status: DC
  Administered 2017-06-18 – 2017-06-25 (×14): 1 mg via ORAL
  Filled 2017-06-18 (×16): qty 1

## 2017-06-18 MED ORDER — SERTRALINE HCL 25 MG PO TABS
25.0000 mg | ORAL_TABLET | Freq: Every day | ORAL | Status: DC
  Administered 2017-06-18 – 2017-06-19 (×2): 25 mg via ORAL
  Filled 2017-06-18 (×5): qty 1

## 2017-06-18 NOTE — Social Work (Addendum)
CSW called [facility], patient has no care coordinator assigned.  Was supposed to have follow up w [facility]) in [facility].  Was last hospitalized in August 2018 at [facility].  No records from this hospitalization in Care Everywhere.  Care coordination requested from [facility] to assist w dischage planning. Address listed on facesheet is Office manager in [place] per Peggye Fothergill, LCSW Lead Clinical Social Worker Phone:  [phone]

## 2017-06-18 NOTE — Social Work (Signed)
PATH team rep will assess patient on unit 10/16 to determine if he is eligible for services.  Santa Genera, LCSW Lead Clinical Social Worker Phone:  [phone]

## 2017-06-18 NOTE — Progress Notes (Signed)
Pt stated he might would try a Lidocaine patch for his back, pt was encouraged to discuss this with the doctor to see if this would be a good idea.

## 2017-06-18 NOTE — Progress Notes (Signed)
Recreation Therapy Notes  Date: 06/18/17 Time: 1000 Location: 500 Hall Dayroom  Group Topic: Goal Setting  Goal Area(s) Addresses:  Patient will be able to identify at least 3 life goals.   Patient will be able to identify benefit of investing in life goals.  Patient will be able to identify benefit of setting life goals.   Intervention: Worksheets, pens  Activity: Life Goals.  Patients were given a worksheet with six categories (family, friends, work/school, spirituality, body and mental health).  Patients were to explain what they were doing well, what they needed to improve and make a goal for each category.    Education:  Discharge Planning, Pharmacologist, Leisure Education   Education Outcome: Acknowledges Education/In Group Clarification Provided/Needs Additional Education  Clinical Observations: Pt did not attend group.   Caroll Rancher, LRT/CTRS         Caroll Rancher A 06/18/2017 12:41 PM

## 2017-06-18 NOTE — Progress Notes (Signed)
DAR NOTE: Patient presents with flat affect and depressed mood.  Reports pain, auditory and suicidal thoughts but contracts for safety.  Described energy level as normal and concentration as poor.  Rates depression at 6, hopelessness at 5, and anxiety at 5.  Maintained on routine safety checks.  Medications given as prescribed.  Support and encouragement offered as needed.  Attended group and participated.  States goal for today is "housing."  Patient observed socializing with peers in the dayroom.

## 2017-06-18 NOTE — Tx Team (Signed)
Interdisciplinary Treatment and Diagnostic Plan Update  06/18/2017 Time of Session: 9:00 AM Charles Orr MRN: [id number]  Principal Diagnosis: Bipolar disorder with psychotic features [facility])  Secondary Diagnoses: Principal Problem:   Bipolar disorder with psychotic features ([place])   Current Medications:  Current Facility-Administered Medications  Medication Dose Route Frequency Provider Last Rate Last Dose  . acetaminophen (TYLENOL) tablet 650 mg  650 mg Oral Q6H PRN Lindon Romp A, NP   650 mg at 06/17/17 2145  . aspirin EC tablet 81 mg  81 mg Oral Daily Okonkwo, Justina A, NP   81 mg at 06/18/17 0817  . atorvastatin (LIPITOR) tablet 40 mg  40 mg Oral q1800 Okonkwo, Justina A, NP   40 mg at 06/17/17 1727  . gabapentin (NEURONTIN) capsule 300 mg  300 mg Oral BID Okonkwo, Justina A, NP   300 mg at 06/18/17 0816  . hydrochlorothiazide (HYDRODIURIL) tablet 25 mg  25 mg Oral Daily Okonkwo, Justina A, NP   25 mg at 06/18/17 0817  . hydrOXYzine (ATARAX/VISTARIL) tablet 25 mg  25 mg Oral TID PRN Hughie Closs A, NP   25 mg at 06/17/17 1808  . insulin aspart (novoLOG) injection 0-15 Units  0-15 Units Subcutaneous TID WC Lindon Romp A, NP   8 Units at 06/18/17 1203  . insulin aspart (novoLOG) injection 0-5 Units  0-5 Units Subcutaneous QHS Lindon Romp A, NP   4 Units at 06/17/17 2144  . insulin aspart protamine- aspart (NOVOLOG MIX 70/30) injection 60 Units  60 Units Subcutaneous Q breakfast Okonkwo, Justina A, NP   60 Units at 06/18/17 0819   And  . insulin aspart protamine- aspart (NOVOLOG MIX 70/30) injection 45 Units  45 Units Subcutaneous Q supper Okonkwo, Justina A, NP   45 Units at 06/17/17 1723  . levofloxacin (LEVAQUIN) tablet 750 mg  750 mg Oral Daily Okonkwo, Justina A, NP   750 mg at 06/18/17 0816  . lidocaine (LIDODERM) 5 % 1 patch  1 patch Transdermal Daily Cobos, Myer Peer, MD   1 patch at 06/18/17 1201  . metoprolol tartrate (LOPRESSOR) tablet 25 mg  25 mg Oral BID Okonkwo,  Justina A, NP   25 mg at 06/18/17 0800  . multivitamin with minerals tablet 1 tablet  1 tablet Oral Daily Okonkwo, Justina A, NP   1 tablet at 06/18/17 0816  . nicotine (NICODERM CQ - dosed in mg/24 hours) patch 14 mg  14 mg Transdermal Daily Cobos, Myer Peer, MD   14 mg at 06/18/17 0800  . risperiDONE (RISPERDAL) tablet 1 mg  1 mg Oral BID Cobos, Fernando A, MD      . sertraline (ZOLOFT) tablet 25 mg  25 mg Oral Daily Cobos, Myer Peer, MD   25 mg at 06/18/17 1200  . traZODone (DESYREL) tablet 100 mg  100 mg Oral QHS PRN Cobos, Myer Peer, MD   100 mg at 06/17/17 2145   PTA Medications: Prescriptions Prior to Admission  Medication Sig Dispense Refill Last Dose  . acetaminophen (TYLENOL) 325 MG tablet Take 1 tablet (325 mg total) by mouth every 6 (six) hours as needed. (Patient taking differently: Take 1,300 mg by mouth every 6 (six) hours as needed for mild pain. ) 120 tablet 0 06/14/2017 at Unknown time  . ARIPiprazole (ABILIFY) 10 MG tablet Take 1 tablet (10 mg total) by mouth daily. 30 tablet 0 06/13/2017 at Unknown time  . aspirin 81 MG EC tablet Take 1 tablet (81 mg total) by mouth daily.  30 tablet 0 06/13/2017 at Unknown time  . atorvastatin (LIPITOR) 40 MG tablet Take 1 tablet (40 mg total) by mouth daily. 30 tablet 0 06/13/2017 at Unknown time  . gabapentin (NEURONTIN) 300 MG capsule Take 1 capsule (300 mg total) by mouth 2 (two) times daily. 60 capsule 0 06/13/2017 at Unknown time  . hydrochlorothiazide (HYDRODIURIL) 25 MG tablet Take 1 tablet (25 mg total) by mouth daily. 30 tablet 0 06/13/2017 at Unknown time  . insulin aspart protamine- aspart (NOVOLOG MIX 70/30) (70-30) 100 UNIT/ML injection Inject 0.45-0.6 mLs (45-60 Units total) into the skin See admin instructions. Inject 60 units subcutaneously every morning and 45 units at night (Patient taking differently: Inject 45-60 Units into the skin See admin instructions. Inject 65 units subcutaneously every morning and 45 units at night)  50 mL 0 06/14/2017 at Unknown time  . levofloxacin (LEVAQUIN) 750 MG tablet Take 1 tablet (750 mg total) by mouth daily. 4 tablet 0 06/13/2017 at Unknown time  . metoprolol tartrate (LOPRESSOR) 25 MG tablet Take 1 tablet (25 mg total) by mouth 2 (two) times daily. 60 tablet 0 06/13/2017 at 1500  . Multiple Vitamin (MULTIVITAMIN WITH MINERALS) TABS tablet Take 1 tablet by mouth daily.   06/13/2017 at Unknown time  . polyethylene glycol (MIRALAX / GLYCOLAX) packet Take 17 g by mouth 2 (two) times daily. 14 each 0 06/13/2017 at Unknown time    Patient Stressors: Financial difficulties Medication change or noncompliance  Patient Strengths: General fund of knowledge Motivation for treatment/growth Supportive family/friends  Treatment Modalities: Medication Management, Group therapy, Case management,  1 to 1 session with clinician, Psychoeducation, Recreational therapy.   Physician Treatment Plan for Primary Diagnosis: Bipolar disorder with psychotic features ([place]) Long Term Goal(s): Improvement in symptoms so as ready for discharge Improvement in symptoms so as ready for discharge   Short Term Goals: Ability to identify changes in lifestyle to reduce recurrence of condition will improve Ability to disclose and discuss suicidal ideas Ability to demonstrate self-control will improve Ability to identify changes in lifestyle to reduce recurrence of condition will improve Ability to verbalize feelings will improve Ability to maintain clinical measurements within normal limits will improve Compliance with prescribed medications will improve  Medication Management: Evaluate patient's response, side effects, and tolerance of medication regimen.  Therapeutic Interventions: 1 to 1 sessions, Unit Group sessions and Medication administration.  Evaluation of Outcomes: Not Met  Physician Treatment Plan for Secondary Diagnosis: Principal Problem:   Bipolar disorder with psychotic features  ([place])  Long Term Goal(s): Improvement in symptoms so as ready for discharge Improvement in symptoms so as ready for discharge   Short Term Goals: Ability to identify changes in lifestyle to reduce recurrence of condition will improve Ability to disclose and discuss suicidal ideas Ability to demonstrate self-control will improve Ability to identify changes in lifestyle to reduce recurrence of condition will improve Ability to verbalize feelings will improve Ability to maintain clinical measurements within normal limits will improve Compliance with prescribed medications will improve     Medication Management: Evaluate patient's response, side effects, and tolerance of medication regimen.  Therapeutic Interventions: 1 to 1 sessions, Unit Group sessions and Medication administration.  Evaluation of Outcomes: Not Met   RN Treatment Plan for Primary Diagnosis: Bipolar disorder with psychotic features ([place]) Long Term Goal(s): Knowledge of disease and therapeutic regimen to maintain health will improve  Short Term Goals: Ability to participate in decision making will improve, Ability to verbalize feelings will improve and Ability  to disclose and discuss suicidal ideas  Medication Management: RN will administer medications as ordered by provider, will assess and evaluate patient's response and provide education to patient for prescribed medication. RN will report any adverse and/or side effects to prescribing provider.  Therapeutic Interventions: 1 on 1 counseling sessions, Psychoeducation, Medication administration, Evaluate responses to treatment, Monitor vital signs and CBGs as ordered, Perform/monitor CIWA, COWS, AIMS and Fall Risk screenings as ordered, Perform wound care treatments as ordered.  Evaluation of Outcomes: Not Met   LCSW Treatment Plan for Primary Diagnosis: Bipolar disorder with psychotic features [facility]) Long Term Goal(s): Safe transition to appropriate next level of care  at discharge, Engage patient in therapeutic group addressing interpersonal concerns.  Short Term Goals: Engage patient in aftercare planning with referrals and resources, Increase ability to appropriately verbalize feelings, Increase emotional regulation, Identify triggers associated with mental health/substance abuse issues and Increase skills for wellness and recovery  Therapeutic Interventions: Assess for all discharge needs, 1 to 1 time with Social worker, Explore available resources and support systems, Assess for adequacy in community support network, Educate family and significant other(s) on suicide prevention, Complete Psychosocial Assessment, Interpersonal group therapy.  Evaluation of Outcomes: Not Met   Progress in Treatment: Attending groups: Yes. Participating in groups: Yes. Taking medication as prescribed: Yes. Toleration medication: Yes. Family/Significant other contact made: No, will contact:  collateral w patient consent; currently declining Patient understands diagnosis: Yes.  CSW assessing Discussing patient identified problems/goals with staff: Yes. Medical problems stabilized or resolved: Yes. Denies suicidal/homicidal ideation: Yes. contracts for safety on unit, admitted w suicidal ideation, coping skills inadequate for community stressors Issues/concerns per patient self-inventory: No. Other: NA  New problem(s) identified: Yes, Describe:  patient lives in [place], states he is looking for son in [place] but [place] know where he is; potentially homeless and lacks access to resources  New Short Term/Long Term Goal(s):  Wants stability in housing, medication stability, increased coping skills  Discharge Plan or Barriers: return to outpatient and follow up, provide resources for housing stability  Reason for Continuation of Hospitalization: Depression Medication stabilization Suicidal ideation  Estimated Length of Stay:  5 - 7 days;  06/23/17  Attendees: Patient: 06/18/2017 12:06 PM  Physician: Gabriel Earing MD 06/18/2017 12:06 PM  Nursing: Jackie Plum RN 06/18/2017 12:06 PM  RN Care Manager: 06/18/2017 12:06 PM  Social Worker: Eusebio Me LCSW 06/18/2017 12:06 PM  Recreational Therapist:  06/18/2017 12:06 PM  Other:  06/18/2017 12:06 PM  Other:  06/18/2017 12:06 PM  Other: 06/18/2017 12:06 PM    Scribe for Treatment Team: Beverely Pace, LCSW 06/18/2017 12:06 PM

## 2017-06-18 NOTE — Plan of Care (Signed)
Problem: Medication: Goal: Compliance with prescribed medication regimen will improve Outcome: Progressing Patient is compliant with prescribed medications.   

## 2017-06-18 NOTE — Progress Notes (Signed)
[facility] MD Progress Note  06/18/2017 3:34 PM Charles Orr  MRN:  [id number]   Subjective:  Charles Orr reports "I have been dealing with a lot of  emotional problems."  Objective: Charles Orr is awake, alert and oriented. Patient was evaluated by MD and NP.  Reports he feels as if his current medication is not working. (Abilify and Lexapro). patient has agreed to take start Zoloft and Risperdal for the auditory hallucination and depression.  Denies suicidal or homicidal ideation during this assessment. Denies visual hallucination and does not appear to be responding to internal stimuli. Support, encouragement and reassurance was provided.    Principal Problem: Bipolar disorder with psychotic features [facility]) Diagnosis:   Patient Active Problem List   Diagnosis Date Noted  . Bipolar disorder with psychotic features (HCC) [F31.9] 06/16/2017  . Depression [F32.9] 06/15/2017  . Other schizophrenia (HCC) [F20.89]   . Pneumonia [J18.9] 06/11/2017  . Community acquired pneumonia of right lower lobe of lung (HCC) [J18.1]   . Hyperglycemia [R73.9]   . Localized edema [R60.0]   . Shortness of breath [R06.02] 06/10/2017   Total Time spent with patient: 30 minutes  Past Psychiatric History:   Past Medical History:  Past Medical History:  Diagnosis Date  . Anxiety   . Arthritis    "back" (06/12/2017)  . Bipolar disorder (HCC)   . CAP (community acquired pneumonia) 06/10/2017  . Chronic lower back pain   . Degenerative disc disease, lumbar   . Depression   . Fibromyalgia   . GERD (gastroesophageal reflux disease)   . High cholesterol   . Hypertension   . Myocardial infarction (HCC) 2002  . Pneumonia 1989  . Schizophrenia (HCC)   . Type II diabetes mellitus (HCC)     Past Surgical History:  Procedure Laterality Date  . CARDIAC CATHETERIZATION  12/2006   [name] 01/18/2011  . INGUINAL HERNIA REPAIR Right   . PARATHYROIDECTOMY    . PERICARDIAL TAP  2000   ?; in [place]/notes 01/18/2011    Family History: History reviewed. No pertinent family history. Family Psychiatric  History:  Social History:  History  Alcohol Use  . Yes    Comment: 06/12/2017 "stopped ~ 1 yr ago"      History  Drug Use  . Types: Cocaine    Comment: 06/12/2017 "stopped using cocaine ~ 1 yr ago; used marijuana when I was younger" last cocaine use 9 mths ago per pt on 06/15/17    Social History   Social History  . Marital status: Divorced    Spouse name: N/A  . Number of children: N/A  . Years of education: N/A   Social History Main Topics  . Smoking status: Current Every Day Smoker    Packs/day: 0.50    Years: 38.00    Types: Cigarettes  . Smokeless tobacco: Current User    Types: Chew  . Alcohol use Yes     Comment: 06/12/2017 "stopped ~ 1 yr ago"   . Drug use: Yes    Types: Cocaine     Comment: 06/12/2017 "stopped using cocaine ~ 1 yr ago; used marijuana when I was younger" last cocaine use 9 mths ago per pt on 06/15/17  . Sexual activity: Not Currently   Other Topics Concern  . None   Social History Narrative  . None   Additional Social History:  Sleep: Fair  Appetite:  Fair  Current Medications: Current Facility-Administered Medications  Medication Dose Route Frequency Provider Last Rate Last Dose  . acetaminophen (TYLENOL) tablet 650 mg  650 mg Oral Q6H PRN Nira Conn A, NP   650 mg at 06/17/17 2145  . aspirin EC tablet 81 mg  81 mg Oral Daily Okonkwo, Justina A, NP   81 mg at 06/18/17 0817  . atorvastatin (LIPITOR) tablet 40 mg  40 mg Oral q1800 Okonkwo, Justina A, NP   40 mg at 06/17/17 1727  . gabapentin (NEURONTIN) capsule 300 mg  300 mg Oral BID Okonkwo, Justina A, NP   300 mg at 06/18/17 0816  . hydrochlorothiazide (HYDRODIURIL) tablet 25 mg  25 mg Oral Daily Okonkwo, Justina A, NP   25 mg at 06/18/17 0817  . hydrOXYzine (ATARAX/VISTARIL) tablet 25 mg  25 mg Oral TID PRN Ferne Reus A, NP   25 mg at 06/17/17 1808  . insulin  aspart (novoLOG) injection 0-15 Units  0-15 Units Subcutaneous TID WC Nira Conn A, NP   8 Units at 06/18/17 1203  . insulin aspart (novoLOG) injection 0-5 Units  0-5 Units Subcutaneous QHS Nira Conn A, NP   4 Units at 06/17/17 2144  . insulin aspart protamine- aspart (NOVOLOG MIX 70/30) injection 60 Units  60 Units Subcutaneous Q breakfast Okonkwo, Justina A, NP   60 Units at 06/18/17 0819   And  . insulin aspart protamine- aspart (NOVOLOG MIX 70/30) injection 45 Units  45 Units Subcutaneous Q supper Okonkwo, Justina A, NP   45 Units at 06/17/17 1723  . levofloxacin (LEVAQUIN) tablet 750 mg  750 mg Oral Daily Okonkwo, Justina A, NP   750 mg at 06/18/17 0816  . lidocaine (LIDODERM) 5 % 1 patch  1 patch Transdermal Daily Cobos, Rockey Situ, MD   1 patch at 06/18/17 1201  . metoprolol tartrate (LOPRESSOR) tablet 25 mg  25 mg Oral BID Okonkwo, Justina A, NP   25 mg at 06/18/17 0800  . multivitamin with minerals tablet 1 tablet  1 tablet Oral Daily Okonkwo, Justina A, NP   1 tablet at 06/18/17 0816  . nicotine (NICODERM CQ - dosed in mg/24 hours) patch 14 mg  14 mg Transdermal Daily Cobos, Rockey Situ, MD   14 mg at 06/18/17 0800  . risperiDONE (RISPERDAL) tablet 1 mg  1 mg Oral BID Cobos, Fernando A, MD      . sertraline (ZOLOFT) tablet 25 mg  25 mg Oral Daily Cobos, Rockey Situ, MD   25 mg at 06/18/17 1200  . traZODone (DESYREL) tablet 100 mg  100 mg Oral QHS PRN Cobos, Rockey Situ, MD   100 mg at 06/17/17 2145    Lab Results:  Results for orders placed or performed during the hospital encounter of 06/16/17 (from the past 48 hour(s))  Glucose, capillary     Status: Abnormal   Collection Time: 06/16/17 10:28 PM  Result Value Ref Range   Glucose-Capillary 371 (H) 65 - 99 mg/dL   Comment 1 Notify RN   Glucose, capillary     Status: Abnormal   Collection Time: 06/17/17  6:02 AM  Result Value Ref Range   Glucose-Capillary 143 (H) 65 - 99 mg/dL  Glucose, capillary     Status: Abnormal    Collection Time: 06/17/17 12:12 PM  Result Value Ref Range   Glucose-Capillary 240 (H) 65 - 99 mg/dL  Glucose, capillary     Status: Abnormal   Collection Time: 06/17/17  5:06 PM  Result Value Ref Range   Glucose-Capillary 365 (H) 65 - 99 mg/dL  Glucose, capillary     Status: Abnormal   Collection Time: 06/17/17  8:27 PM  Result Value Ref Range   Glucose-Capillary 329 (H) 65 - 99 mg/dL   Comment 1 Notify RN   Glucose, capillary     Status: Abnormal   Collection Time: 06/18/17  5:48 AM  Result Value Ref Range   Glucose-Capillary 219 (H) 65 - 99 mg/dL   Comment 1 Notify RN   Glucose, capillary     Status: Abnormal   Collection Time: 06/18/17 11:29 AM  Result Value Ref Range   Glucose-Capillary 281 (H) 65 - 99 mg/dL    Blood Alcohol level:  Lab Results  Component Value Date   ETH <10 06/15/2017   ETH <10 06/14/2017    Metabolic Disorder Labs: Lab Results  Component Value Date   HGBA1C 9.5 (H) 06/10/2017   MPG 225.95 06/10/2017   No results found for: PROLACTIN Lab Results  Component Value Date   CHOL 85 06/10/2017   TRIG 101 06/10/2017   HDL 22 (L) 06/10/2017   CHOLHDL 3.9 06/10/2017   VLDL 20 06/10/2017   LDLCALC 43 06/10/2017    Physical Findings: AIMS: Facial and Oral Movements Muscles of Facial Expression: None, normal Lips and Perioral Area: None, normal Jaw: None, normal Tongue: None, normal,Extremity Movements Upper (arms, wrists, hands, fingers): None, normal Lower (legs, knees, ankles, toes): None, normal, Trunk Movements Neck, shoulders, hips: None, normal, Overall Severity Severity of abnormal movements (highest score from questions above): None, normal Incapacitation due to abnormal movements: None, normal Patient's awareness of abnormal movements (rate only patient's report): No Awareness, Dental Status Current problems with teeth and/or dentures?: No Does patient usually wear dentures?: No  CIWA:  CIWA-Ar Total: 1 COWS:  COWS Total Score:  2  Musculoskeletal: Strength & Muscle Tone: within normal limits Gait & Station: normal Patient leans: N/A  Psychiatric Specialty Exam: Physical Exam  Constitutional: He appears well-developed.  HENT:  Head: Normocephalic.  Neurological: He is alert.  Psychiatric: He has a normal mood and affect.    Review of Systems  Psychiatric/Behavioral: Positive for depression, hallucinations and suicidal ideas. The patient has insomnia.     Blood pressure 106/67, pulse 90, temperature 98.6 F (37 C), resp. rate 18, height  (1.88 m), weight 117 kg (258 lb).Body mass index is 33.13 kg/m.  General Appearance: Casual and Guarded  Eye Contact:  Good  Speech:  Clear and Coherent  Volume:  Normal  Mood:  Depressed  Affect:  Congruent and Depressed  Thought Process:  Coherent  Orientation:  Full (Time, Place, and Person)  Thought Content:  Hallucinations: Auditory  Suicidal Thoughts:  Yes.  with intent/plan to jump off a bridge   Homicidal Thoughts:  No  Memory:  Immediate;   Fair Recent;   Fair Remote;   Fair  Judgement:  Fair  Insight:  Present  Psychomotor Activity:  Normal  Concentration:  Concentration: Fair  Recall:  [employer] of Knowledge:  Fair  Language:  Fair  Akathisia:  No  Handed:  Right  AIMS (if indicated):     Assets:  Communication Skills Desire for Improvement Financial Resources/Insurance Resilience Social Support  ADL's:  Intact  Cognition:  WNL  Sleep:  Number of Hours: 6.5     I agree with current treatment plan on 06/18/2017, Patient seen face-to-face for psychiatric evaluation follow-up, chart reviewed. Reviewed the  information documented and agree with the treatment plan.  Treatment Plan Summary: Daily contact with patient to assess and evaluate symptoms and progress in treatment and Medication management  Continue with Neurontin 300 mg  mgs for mood stabilization. Discontinue Abilify 10 mg for mood stabilization Start Risperdal 1 mg BID  and  Zoloft  25 mg for mood stabilizaiton Continue with Trazodone 100 mg for insomnia  Will continue to monitor vitals ,medication compliance and treatment side effects while patient is here.  Reviewed labs Glucose 284 elevated ,BAL - , UDS -  CSW will start working on disposition.  Patient to participate in therapeutic milieu  Oneta Rack, NP 06/18/2017, 3:34 PM   Agree with NP Progress Note

## 2017-06-18 NOTE — Progress Notes (Signed)
Recreation Therapy Notes  INPATIENT RECREATION THERAPY ASSESSMENT  Patient Details Name: Charles Orr MRN: [id number] DOB: 1961-07-15 Today's Date: 06/18/2017  Patient Stressors: Work, Other (Comment) (Life, disabled, about to be homeless)  Pt stated he was here for suicidal ideation.  Pt stated he was no longer able to work.  Coping Skills:   Isolate, Avoidance, Self-Injury, Talking, Sports  Personal Challenges: Anger, Concentration, Decision-Making, Expressing Yourself, Problem-Solving, Self-Esteem/Confidence, Stress Management  Leisure Interests (2+):  Community - Travel (Comment), Community - Other (Comment) (Church)  Awareness of Community Resources:  Yes  Community Resources:  Church, [employer], Public affairs consultant, Engineering geologist  Current Use: Yes  Patient Strengths:  Awareness; able to love people  Patient Identified Areas of Improvement:  Anger; decision-making  Current Recreation Participation:  Very little  Patient Goal for Hospitalization:  "To get stress under control, find a place to live"  [place] of Residence:  [place] of Residence:  [place]  Current SI (including self-harm):  Yes (Rated a 5 out of 10; contracts for safety)  Current HI:  No  Consent to Intern Participation: N/A   Caroll Rancher, LRT/CTRS  Caroll Rancher A 06/18/2017, 3:12 PM

## 2017-06-18 NOTE — [facility] Group Notes (Signed)
LCSW Group Therapy Note   06/18/2017 1:15pm   Type of Therapy and Topic:  Group Therapy:  Overcoming Obstacles   Participation Level:  Active   Description of Group:    In this group patients will be encouraged to explore what they see as obstacles to their own wellness and recovery. They will be guided to discuss their thoughts, feelings, and behaviors related to these obstacles. The group will process together ways to cope with barriers, with attention given to specific choices patients can make. Each patient will be challenged to identify changes they are motivated to make in order to overcome their obstacles. This group will be process-oriented, with patients participating in exploration of their own experiences as well as giving and receiving support and challenge from other group members.   Therapeutic Goals: 1. Patient will identify personal and current obstacles as they relate to admission. 2. Patient will identify barriers that currently interfere with their wellness or overcoming obstacles.  3. Patient will identify feelings, thought process and behaviors related to these barriers. 4. Patient will identify two changes they are willing to make to overcome these obstacles:      Summary of Patient Progress Patient participated in a group exercise which identified challenges and identified positive/negative verbal, cognitive and behavioral responses.  Patient participated in a mindfulness exercise and a game designed to encourage making choices.  Identified lack of housing as his most significant stressor; feels that he needs to keep looking for options/choices for subsidized housing.  Verbalized importance of keeping a positive attitude as he continues to search for stability.      Therapeutic Modalities:   Cognitive Behavioral Therapy Solution Focused Therapy Motivational Interviewing Relapse Prevention Therapy  Sallee Lange, LCSW 06/18/2017 1:11 PM

## 2017-06-19 DIAGNOSIS — F1410 Cocaine abuse, uncomplicated: Secondary | ICD-10-CM

## 2017-06-19 DIAGNOSIS — J189 Pneumonia, unspecified organism: Secondary | ICD-10-CM

## 2017-06-19 LAB — GLUCOSE, CAPILLARY
GLUCOSE-CAPILLARY: 241 mg/dL — AB (ref 65–99)
Glucose-Capillary: 277 mg/dL — ABNORMAL HIGH (ref 65–99)
Glucose-Capillary: 320 mg/dL — ABNORMAL HIGH (ref 65–99)
Glucose-Capillary: 340 mg/dL — ABNORMAL HIGH (ref 65–99)

## 2017-06-19 MED ORDER — SERTRALINE HCL 50 MG PO TABS
50.0000 mg | ORAL_TABLET | Freq: Every day | ORAL | Status: DC
  Administered 2017-06-20 – 2017-06-26 (×7): 50 mg via ORAL
  Filled 2017-06-19 (×5): qty 1
  Filled 2017-06-19: qty 7
  Filled 2017-06-19 (×3): qty 1

## 2017-06-19 NOTE — Progress Notes (Signed)
Recreation Therapy Notes  Date: 06/19/17 Time: 1000 Location: 500 Hall Dayroom  Group Topic: Coping Skills  Goal Area(s) Addresses:  Patients will be able to identify positive coping skills. Patients will be able to identify the benefits of coping skills. Patients will be able to identify benefits of using coping skills post d/c.  Behavioral Response: Engaged  Intervention: Worksheet, pens  Activity: Mind map.  Patients were given a blank mind map.  LRT and patients filled in the first eight boxes together.  Patients identified depression, anxiety, loneliness, homeless, anger, hearing voices, seeing things and overdose as things we need coping skills for.  Patients were to then come up with three coping skills for each of the eight problems identified.  Education: Pharmacologist, Building control surveyor.   Education Outcome: Acknowledges understanding/In group clarification offered/Needs additional education.   Clinical Observations/Feedback: Pt identified meditation for anxiety, reading for loneliness, go to an open shelter for homeless, seek professional help for anger, medication for hearing voices, call 911 for seeing things, and see therapist for overdose.  Pt was pleasant and engaged during group.     Caroll Rancher, LRT/CTRS         Lillia Abed, Angeligue Bowne A 06/19/2017 12:12 PM

## 2017-06-19 NOTE — Progress Notes (Signed)
Adult Psychoeducational Group Note  Date:  06/19/2017 Time:  9:00 PM  Group Topic/Focus:  Wrap-Up Group:   The focus of this group is to help patients review their daily goal of treatment and discuss progress on daily workbooks.  Participation Level:  Active  Participation Quality:  Appropriate  Affect:  Appropriate  Cognitive:  Alert  Insight: Appropriate  Engagement in Group:  Engaged  Modes of Intervention:  Discussion  Additional Comments:  Pt rated his day 6/10.His goal is to start feeling better.   Hezzie Karim R 06/19/2017, 9:00 PM

## 2017-06-19 NOTE — Progress Notes (Signed)
DAR NOTE: Patient presents with flat affect and depressed mood.  Denies auditory and visual hallucinations.  Reports suicidal thoughts on self inventory form but contracts for safety.  Rates depression at 6, hopelessness at 5, and anxiety at 7.  Maintained on routine safety checks.  Medications given as prescribed.  Support and encouragement offered as needed.  Attended group and participated.  States goal for today is "housing."  Patient visible in the dayroom interacting with peers.  Offered no complaint.

## 2017-06-19 NOTE — Progress Notes (Signed)
[facility] MD Progress Note  06/19/2017 5:35 PM Charles Orr  MRN:  [id number]   Subjective: patient reports he is feeling better today, and states he is feeling more positive and expresses a sense of optimism that medications will help. Reports hallucinations have decreased and are not as severe . Reports mood is " a little better". Denies any suicidal ideations.  Objective:  I have discussed case with treatment team and have met with patient . Staff reports patient is partially improved - remains depressed, with constricted  affect, but is expressing improvement and as above, reports improving mood, feeling more hopeful and positive. At this time denies suicidal ideations. Denies hallucinations. He has been visible on day room, going to some groups, polite on approach.     Principal Problem: Bipolar disorder with psychotic features [facility]) Diagnosis:   Patient Active Problem List   Diagnosis Date Noted  . Bipolar disorder with psychotic features ([place]) [F31.9] 06/16/2017  . Depression [F32.9] 06/15/2017  . Other schizophrenia ([place]) [F20.89]   . Pneumonia [J18.9] 06/11/2017  . Community acquired pneumonia of right lower lobe of lung ([place]) [J18.1]   . Hyperglycemia [R73.9]   . Localized edema [R60.0]   . Shortness of breath [R06.02] 06/10/2017   Total Time spent with patient: 20 minutes  Past Psychiatric History:   Past Medical History:  Past Medical History:  Diagnosis Date  . Anxiety   . Arthritis    "back" (06/12/2017)  . Bipolar disorder ([place])   . CAP (community acquired pneumonia) 06/10/2017  . Chronic lower back pain   . Degenerative disc disease, lumbar   . Depression   . Fibromyalgia   . GERD (gastroesophageal reflux disease)   . High cholesterol   . Hypertension   . Myocardial infarction ([place]) 2002  . Pneumonia 1989  . Schizophrenia ([place])   . Type II diabetes mellitus ([place])     Past Surgical History:  Procedure Laterality Date  . CARDIAC CATHETERIZATION  12/2006    Archie Endo 01/18/2011  . INGUINAL HERNIA REPAIR Right   . PARATHYROIDECTOMY    . PERICARDIAL TAP  2000   ?; in [place]/notes 01/18/2011   Family History: History reviewed. No pertinent family history. Family Psychiatric  History:  Social History:  History  Alcohol Use  . Yes    Comment: 06/12/2017 "stopped ~ 1 yr ago"      History  Drug Use  . Types: Cocaine    Comment: 06/12/2017 "stopped using cocaine ~ 1 yr ago; used marijuana when I was younger" last cocaine use 9 mths ago per pt on 06/15/17    Social History   Social History  . Marital status: Divorced    Spouse name: N/A  . Number of children: N/A  . Years of education: N/A   Social History Main Topics  . Smoking status: Current Every Day Smoker    Packs/day: 0.50    Years: 38.00    Types: Cigarettes  . Smokeless tobacco: Current User    Types: Chew  . Alcohol use Yes     Comment: 06/12/2017 "stopped ~ 1 yr ago"   . Drug use: Yes    Types: Cocaine     Comment: 06/12/2017 "stopped using cocaine ~ 1 yr ago; used marijuana when I was younger" last cocaine use 9 mths ago per pt on 06/15/17  . Sexual activity: Not Currently   Other Topics Concern  . None   Social History Narrative  . None   Additional Social History:  Sleep: improving , states he slept better last night   Appetite:  fair- but states improved compared to admission  Current Medications: Current Facility-Administered Medications  Medication Dose Route Frequency Provider Last Rate Last Dose  . acetaminophen (TYLENOL) tablet 650 mg  650 mg Oral Q6H PRN Lindon Romp A, NP   650 mg at 06/18/17 1704  . aspirin EC tablet 81 mg  81 mg Oral Daily Okonkwo, Justina A, NP   81 mg at 06/19/17 0810  . atorvastatin (LIPITOR) tablet 40 mg  40 mg Oral q1800 Okonkwo, Justina A, NP   40 mg at 06/19/17 1714  . gabapentin (NEURONTIN) capsule 300 mg  300 mg Oral BID Okonkwo, Justina A, NP   300 mg at 06/19/17 1714  . hydrochlorothiazide (HYDRODIURIL) tablet 25 mg  25 mg  Oral Daily Okonkwo, Justina A, NP   25 mg at 06/19/17 0810  . hydrOXYzine (ATARAX/VISTARIL) tablet 25 mg  25 mg Oral TID PRN Hughie Closs A, NP   25 mg at 06/18/17 2117  . insulin aspart (novoLOG) injection 0-15 Units  0-15 Units Subcutaneous TID WC Lindon Romp A, NP   11 Units at 06/19/17 1713  . insulin aspart (novoLOG) injection 0-5 Units  0-5 Units Subcutaneous QHS Lindon Romp A, NP   4 Units at 06/18/17 2117  . insulin aspart protamine- aspart (NOVOLOG MIX 70/30) injection 60 Units  60 Units Subcutaneous Q breakfast Okonkwo, Justina A, NP   60 Units at 06/19/17 0812   And  . insulin aspart protamine- aspart (NOVOLOG MIX 70/30) injection 45 Units  45 Units Subcutaneous Q supper Okonkwo, Justina A, NP   45 Units at 06/19/17 1714  . levofloxacin (LEVAQUIN) tablet 750 mg  750 mg Oral Daily Okonkwo, Justina A, NP   750 mg at 06/19/17 0810  . lidocaine (LIDODERM) 5 % 1 patch  1 patch Transdermal Daily Maxim Bedel, Myer Peer, MD   1 patch at 06/19/17 0816  . metoprolol tartrate (LOPRESSOR) tablet 25 mg  25 mg Oral BID Okonkwo, Justina A, NP   25 mg at 06/19/17 1714  . multivitamin with minerals tablet 1 tablet  1 tablet Oral Daily Okonkwo, Justina A, NP   1 tablet at 06/19/17 0810  . nicotine (NICODERM CQ - dosed in mg/24 hours) patch 14 mg  14 mg Transdermal Daily Jalisia Puchalski, Myer Peer, MD   14 mg at 06/19/17 0800  . risperiDONE (RISPERDAL) tablet 1 mg  1 mg Oral BID Munira Polson, Myer Peer, MD   1 mg at 06/19/17 1714  . sertraline (ZOLOFT) tablet 25 mg  25 mg Oral Daily Wyeth Hoffer, Myer Peer, MD   25 mg at 06/19/17 0811  . traZODone (DESYREL) tablet 100 mg  100 mg Oral QHS PRN Delrick Dehart, Myer Peer, MD   100 mg at 06/18/17 2118    Lab Results:  Results for orders placed or performed during the hospital encounter of 06/16/17 (from the past 48 hour(s))  Glucose, capillary     Status: Abnormal   Collection Time: 06/17/17  8:27 PM  Result Value Ref Range   Glucose-Capillary 329 (H) 65 - 99 mg/dL   Comment 1 Notify  RN   Glucose, capillary     Status: Abnormal   Collection Time: 06/18/17  5:48 AM  Result Value Ref Range   Glucose-Capillary 219 (H) 65 - 99 mg/dL   Comment 1 Notify RN   Glucose, capillary     Status: Abnormal   Collection Time: 06/18/17 11:29 AM  Result Value  Ref Range   Glucose-Capillary 281 (H) 65 - 99 mg/dL  Glucose, capillary     Status: Abnormal   Collection Time: 06/18/17  4:45 PM  Result Value Ref Range   Glucose-Capillary 382 (H) 65 - 99 mg/dL  Glucose, capillary     Status: Abnormal   Collection Time: 06/18/17  8:44 PM  Result Value Ref Range   Glucose-Capillary 334 (H) 65 - 99 mg/dL   Comment 1 Notify RN   Glucose, capillary     Status: Abnormal   Collection Time: 06/19/17  6:01 AM  Result Value Ref Range   Glucose-Capillary 241 (H) 65 - 99 mg/dL   Comment 1 Notify RN   Glucose, capillary     Status: Abnormal   Collection Time: 06/19/17 11:44 AM  Result Value Ref Range   Glucose-Capillary 277 (H) 65 - 99 mg/dL  Glucose, capillary     Status: Abnormal   Collection Time: 06/19/17  5:02 PM  Result Value Ref Range   Glucose-Capillary 320 (H) 65 - 99 mg/dL   Comment 1 Notify RN    Comment 2 Document in Chart     Blood Alcohol level:  Lab Results  Component Value Date   ETH <10 06/15/2017   ETH <10 81/27/5170    Metabolic Disorder Labs: Lab Results  Component Value Date   HGBA1C 9.5 (H) 06/10/2017   MPG 225.95 06/10/2017   No results found for: PROLACTIN Lab Results  Component Value Date   CHOL 85 06/10/2017   TRIG 101 06/10/2017   HDL 22 (L) 06/10/2017   CHOLHDL 3.9 06/10/2017   VLDL 20 06/10/2017   LDLCALC 43 06/10/2017    Physical Findings: AIMS: Facial and Oral Movements Muscles of Facial Expression: None, normal Lips and Perioral Area: None, normal Jaw: None, normal Tongue: None, normal,Extremity Movements Upper (arms, wrists, hands, fingers): None, normal Lower (legs, knees, ankles, toes): None, normal, Trunk Movements Neck, shoulders,  hips: None, normal, Overall Severity Severity of abnormal movements (highest score from questions above): None, normal Incapacitation due to abnormal movements: None, normal Patient's awareness of abnormal movements (rate only patient's report): No Awareness, Dental Status Current problems with teeth and/or dentures?: No Does patient usually wear dentures?: No  CIWA:  CIWA-Ar Total: 1 COWS:  COWS Total Score: 2  Musculoskeletal: Strength & Muscle Tone: within normal limits Gait & Station: normal Patient leans: N/A  Psychiatric Specialty Exam: Physical Exam  Constitutional: He appears well-developed.  HENT:  Head: Normocephalic.  Neurological: He is alert.  Psychiatric: He has a normal mood and affect.  no chest pain,no shortness of breath, no vomiting , chronic lower back pain  Review of Systems  Psychiatric/Behavioral: Positive for depression, hallucinations and suicidal ideas. The patient has insomnia.     Blood pressure 110/81, pulse 76, temperature (!) 97.5 F (36.4 C), temperature source Oral, resp. rate 20, height 6' 2"  (1.88 m), weight 117 kg (258 lb).Body mass index is 33.13 kg/m.  General Appearance: Fairly Groomed  Eye Contact:  Good  Speech:  Normal Rate  Volume:  Normal  Mood:  improving compared to admission   Affect:  less constricted   Thought Process:  Linear and Descriptions of Associations: Intact  Orientation:  Other:  fully alert and attentive  Thought Content:  reports decreased hallucinations, and does not appear internally preoccupied   Suicidal Thoughts:  No - currently denies suicidal or self injurious ideations and contracts for safety at this time  Homicidal Thoughts:  No  Memory:  Recent and remote grossly intact  Judgement:  Fair- improving   Insight:  Present  Psychomotor Activity:  Normal  Concentration:  Concentration: Good and Attention Span: Good  Recall:  Good  Fund of Knowledge:  Good  Language:  Good  Akathisia:  No  Handed:  Right   AIMS (if indicated):     Assets:  Communication Skills Desire for Improvement Financial Resources/Insurance Resilience Social Support  ADL's:  Intact  Cognition:  WNL  Sleep:  Number of Hours: 6.75   Assessment - patient is presenting with partial improvement - reports he is less depressed, presents less constricted in affect , described decreased hallucinations. Denies SI today. Thus far tolerating medications well .  Treatment Plan Summary: Treatment plan reviewed as below today 10/16  Daily contact with patient to assess and evaluate symptoms and progress in treatment and Medication management Encourage ongoing group and milieu participation to work on coping skills and symptom reduction Continue  Neurontin 300 mg BID for pain and anxiety Continue Risperdal 1 mg BID for psychosis and mood  Continue Zoloft  50 mgrs QDAY for depression  Continue Trazodone 100 mg PRN for insomnia as needed  Treatment team working on disposition planning options  Jenne Campus, MD 06/19/2017, 5:35 PM   Patient ID: Charles Orr, male   DOB: [birth year], 56 y.o.   MRN: [id number]

## 2017-06-19 NOTE — Progress Notes (Signed)
D: Pt passive SI/ AH- contracts for safety. Pt is pleasant and cooperative. Pt stated he had a good day, pt stated he was doing a little better, pt seen in the dayroom for a little while this evening  A: Pt was offered support and encouragement. Pt was given scheduled medications. Pt was encourage to attend groups. Q 15 minute checks were done for safety.   R:Pt attends groups and interacts well with peers and staff. Pt is taking medication. Pt receptive to treatment and safety maintained on unit.

## 2017-06-19 NOTE — Plan of Care (Signed)
Problem: Coping: Goal: Ability to cope will improve Outcome: Progressing Pt stated his depression has decreased.   Problem: Coping: Goal: Ability to verbalize frustrations and anger appropriately will improve Outcome: Progressing Pt stated he was feeling a little better

## 2017-06-19 NOTE — [facility] Group Notes (Signed)
LCSW Group Therapy Note   06/19/2017 1:15pm   Type of Therapy and Topic:  Group Therapy:  Positive Affirmations   Participation Level:  Active  Description of Group: This group addressed positive affirmation toward self and others. Patients went around the room and identified two positive things about themselves and two positive things about a peer in the room. Patients reflected on how it felt to share something positive with others, to identify positive things about themselves, and to hear positive things from others. Patients were encouraged to have a daily reflection of positive characteristics or circumstances.  Therapeutic Goals 1. Patient will verbalize two of their positive qualities 2. Patient will demonstrate empathy for others by stating two positive qualities about a peer in the group 3. Patient will verbalize their feelings when voicing positive self affirmations and when voicing positive affirmations of others 4. Patients will discuss the potential positive impact on their wellness/recovery of focusing on positive traits of self and others. Summary of Patient Progress:  Stayed the entire time, engaged throughout.  "I am learning to ask for help rather than going it alone.  In the past, I would have turned to drugs and alcohol, but this time I wanted to come to the hospital instead of slipping into despair and shame."   Therapeutic Modalities Cognitive Behavioral Therapy Motivational Interviewing  Ida Rogue, LCSW 06/19/2017 3:29 PM

## 2017-06-19 NOTE — Plan of Care (Signed)
Problem: Safety: Goal: Ability to remain free from injury will improve Outcome: Progressing Patient is safe and free from injury.   

## 2017-06-20 LAB — GLUCOSE, CAPILLARY
GLUCOSE-CAPILLARY: 193 mg/dL — AB (ref 65–99)
GLUCOSE-CAPILLARY: 311 mg/dL — AB (ref 65–99)
GLUCOSE-CAPILLARY: 335 mg/dL — AB (ref 65–99)
Glucose-Capillary: 248 mg/dL — ABNORMAL HIGH (ref 65–99)

## 2017-06-20 NOTE — Progress Notes (Signed)
Adult Psychoeducational Group Note  Date:  06/20/2017 Time:  8:36 PM  Group Topic/Focus:  Wrap-Up Group:   The focus of this group is to help patients review their daily goal of treatment and discuss progress on daily workbooks.  Participation Level:  Active  Participation Quality:  Appropriate  Affect:  Appropriate  Cognitive:  Alert  Insight: Appropriate  Engagement in Group:  Engaged  Modes of Intervention:  Discussion  Additional Comments:  Pt stated that his day was "fair". His goal is to continue to get better.   Kaleen OdeaCOOKE, Larrissa Stivers R 06/20/2017, 8:36 PM

## 2017-06-20 NOTE — Progress Notes (Signed)
Recreation Therapy Notes  Date: 06/20/17 Time: 1000 Location: 500 Hall Dayroom  Group Topic: Communication, Team Building, Problem Solving  Goal Area(s) Addresses:  Patient will effectively work with peer towards shared goal.  Patient will identify skills used to make activity successful.  Patient will identify how skills used during activity can be used to reach post d/c goals.   Behavioral Response: Engaged  Intervention: STEM Activity  Activity: Stage managerLanding Pad. In teams patients were given 12 plastic drinking straws and a length of masking tape. Using the materials provided patients were asked to build a landing pad to catch a golf ball dropped from approximately 6 feet in the air.   Education: Pharmacist, communityocial Skills, Discharge Planning   Education Outcome: Acknowledges education/In group clarification offered/Needs additional education.   Clinical Observations/Feedback:  Pt shared responsibility with his group.  Pt was bouncing ideas off the rest of the group as well as listening to suggestions they made.  Pt was bright and engaged throughout.   Caroll RancherMarjette Tyquavious Gamel, LRT/CTRS         Caroll RancherLindsay, Margues Filippini A 06/20/2017 12:46 PM

## 2017-06-20 NOTE — Progress Notes (Signed)
Patient ID: Charles Orr, male   DOB: 04/01/1961, 56 y.o.   MRN: [id number]   [facility] MD Progress Note  06/20/2017 7:00 PM Macario J Alcindor  MRN:  [id number]   Subjective: Pt reports improvement of mood symptoms today. He feels that medications are helping his symptoms. He denies SI/HI. He notes that AH are "less frequent." He would like to continue his current regimen without changes. He is sleeping better. He denies any concerns at time of evaluation.  Objective:  I have discussed case with treatment team and have met with patient . Staff reports patient is improving - remains depressed, with constricted  affect, but is expressing improvement and as above, reports improving mood, feeling more hopeful and positive. At this time denies suicidal ideations. Denies current hallucinations. He has been visible on day room, going to some groups, polite on approach.    Principal Problem: Bipolar disorder with psychotic features (HCC) Diagnosis:   Patient Active Problem List   Diagnosis Date Noted  . Bipolar disorder with psychotic features (HCC) [F31.9] 06/16/2017  . Depression [F32.9] 06/15/2017  . Other schizophrenia (HCC) [F20.89]   . Pneumonia [J18.9] 06/11/2017  . Community acquired pneumonia of right lower lobe of lung (HCC) [J18.1]   . Hyperglycemia [R73.9]   . Localized edema [R60.0]   . Shortness of breath [R06.02] 06/10/2017   Total Time spent with patient: 20 minutes  Past Psychiatric History:   Past Medical History:  Past Medical History:  Diagnosis Date  . Anxiety   . Arthritis    "back" (06/12/2017)  . Bipolar disorder (HCC)   . CAP (community acquired pneumonia) 06/10/2017  . Chronic lower back pain   . Degenerative disc disease, lumbar   . Depression   . Fibromyalgia   . GERD (gastroesophageal reflux disease)   . High cholesterol   . Hypertension   . Myocardial infarction (HCC) 2002  . Pneumonia 1989  . Schizophrenia (HCC)   . Type II diabetes mellitus (HCC)     Past  Surgical History:  Procedure Laterality Date  . CARDIAC CATHETERIZATION  12/2006   /notes 01/18/2011  . INGUINAL HERNIA REPAIR Right   . PARATHYROIDECTOMY    . PERICARDIAL TAP  2000   ?; in [place]/notes 01/18/2011   Family History: History reviewed. No pertinent family history. Family Psychiatric  History:  Social History:  History  Alcohol Use  . Yes    Comment: 06/12/2017 "stopped ~ 1 yr ago"      History  Drug Use  . Types: Cocaine    Comment: 06/12/2017 "stopped using cocaine ~ 1 yr ago; used marijuana when I was younger" last cocaine use 9 mths ago per pt on 06/15/17    Social History   Social History  . Marital status: Divorced    Spouse name: N/A  . Number of children: N/A  . Years of education: N/A   Social History Main Topics  . Smoking status: Current Every Day Smoker    Packs/day: 0.50    Years: 38.00    Types: Cigarettes  . Smokeless tobacco: Current User    Types: Chew  . Alcohol use Yes     Comment: 06/12/2017 "stopped ~ 1 yr ago"   . Drug use: Yes    Types: Cocaine     Comment: 06/12/2017 "stopped using cocaine ~ 1 yr ago; used marijuana when I was younger" last cocaine use 9 mths ago per pt on 06/15/17  . Sexual activity: Not Currently   Other   Topics Concern  . None   Social History Narrative  . None   Additional Social History:   Sleep: improving , states he slept better last night   Appetite:  fair- but states improved compared to admission  Current Medications: Current Facility-Administered Medications  Medication Dose Route Frequency Provider Last Rate Last Dose  . acetaminophen (TYLENOL) tablet 650 mg  650 mg Oral Q6H PRN Lindon Romp A, NP   650 mg at 06/20/17 0811  . aspirin EC tablet 81 mg  81 mg Oral Daily Okonkwo, Justina A, NP   81 mg at 06/20/17 0809  . atorvastatin (LIPITOR) tablet 40 mg  40 mg Oral q1800 Okonkwo, Justina A, NP   40 mg at 06/20/17 1719  . gabapentin (NEURONTIN) capsule 300 mg  300 mg Oral BID Okonkwo, Justina A,  NP   300 mg at 06/20/17 1719  . hydrochlorothiazide (HYDRODIURIL) tablet 25 mg  25 mg Oral Daily Okonkwo, Justina A, NP   25 mg at 06/20/17 0809  . hydrOXYzine (ATARAX/VISTARIL) tablet 25 mg  25 mg Oral TID PRN Lu Duffel, Justina A, NP   25 mg at 06/19/17 2101  . insulin aspart (novoLOG) injection 0-15 Units  0-15 Units Subcutaneous TID WC Lindon Romp A, NP   11 Units at 06/20/17 1717  . insulin aspart (novoLOG) injection 0-5 Units  0-5 Units Subcutaneous QHS Lindon Romp A, NP   4 Units at 06/19/17 2110  . insulin aspart protamine- aspart (NOVOLOG MIX 70/30) injection 60 Units  60 Units Subcutaneous Q breakfast Okonkwo, Justina A, NP   60 Units at 06/20/17 0812   And  . insulin aspart protamine- aspart (NOVOLOG MIX 70/30) injection 45 Units  45 Units Subcutaneous Q supper Okonkwo, Justina A, NP   45 Units at 06/20/17 1717  . levofloxacin (LEVAQUIN) tablet 750 mg  750 mg Oral Daily Okonkwo, Justina A, NP   750 mg at 06/20/17 0808  . lidocaine (LIDODERM) 5 % 1 patch  1 patch Transdermal Daily Cobos, Myer Peer, MD   1 patch at 06/20/17 0925  . metoprolol tartrate (LOPRESSOR) tablet 25 mg  25 mg Oral BID Okonkwo, Justina A, NP   25 mg at 06/20/17 1719  . multivitamin with minerals tablet 1 tablet  1 tablet Oral Daily Okonkwo, Justina A, NP   1 tablet at 06/20/17 0808  . nicotine (NICODERM CQ - dosed in mg/24 hours) patch 14 mg  14 mg Transdermal Daily Cobos, Myer Peer, MD   14 mg at 06/20/17 0808  . risperiDONE (RISPERDAL) tablet 1 mg  1 mg Oral BID Cobos, Myer Peer, MD   1 mg at 06/20/17 1718  . sertraline (ZOLOFT) tablet 50 mg  50 mg Oral Daily Cobos, Myer Peer, MD   50 mg at 06/20/17 0809  . traZODone (DESYREL) tablet 100 mg  100 mg Oral QHS PRN Cobos, Myer Peer, MD   100 mg at 06/19/17 2101    Lab Results:  Results for orders placed or performed during the hospital encounter of 06/16/17 (from the past 48 hour(s))  Glucose, capillary     Status: Abnormal   Collection Time: 06/18/17  8:44 PM   Result Value Ref Range   Glucose-Capillary 334 (H) 65 - 99 mg/dL   Comment 1 Notify RN   Glucose, capillary     Status: Abnormal   Collection Time: 06/19/17  6:01 AM  Result Value Ref Range   Glucose-Capillary 241 (H) 65 - 99 mg/dL   Comment 1 Notify RN  Glucose, capillary     Status: Abnormal   Collection Time: 06/19/17 11:44 AM  Result Value Ref Range   Glucose-Capillary 277 (H) 65 - 99 mg/dL  Glucose, capillary     Status: Abnormal   Collection Time: 06/19/17  5:02 PM  Result Value Ref Range   Glucose-Capillary 320 (H) 65 - 99 mg/dL   Comment 1 Notify RN    Comment 2 Document in Chart   Glucose, capillary     Status: Abnormal   Collection Time: 06/19/17  8:37 PM  Result Value Ref Range   Glucose-Capillary 340 (H) 65 - 99 mg/dL  Glucose, capillary     Status: Abnormal   Collection Time: 06/20/17  6:09 AM  Result Value Ref Range   Glucose-Capillary 248 (H) 65 - 99 mg/dL  Glucose, capillary     Status: Abnormal   Collection Time: 06/20/17 11:56 AM  Result Value Ref Range   Glucose-Capillary 193 (H) 65 - 99 mg/dL  Glucose, capillary     Status: Abnormal   Collection Time: 06/20/17  4:58 PM  Result Value Ref Range   Glucose-Capillary 311 (H) 65 - 99 mg/dL   Comment 1 Notify RN    Comment 2 Document in Chart     Blood Alcohol level:  Lab Results  Component Value Date   ETH <10 06/15/2017   ETH <10 06/14/2017    Metabolic Disorder Labs: Lab Results  Component Value Date   HGBA1C 9.5 (H) 06/10/2017   MPG 225.95 06/10/2017   No results found for: PROLACTIN Lab Results  Component Value Date   CHOL 85 06/10/2017   TRIG 101 06/10/2017   HDL 22 (L) 06/10/2017   CHOLHDL 3.9 06/10/2017   VLDL 20 06/10/2017   LDLCALC 43 06/10/2017    Physical Findings: AIMS: Facial and Oral Movements Muscles of Facial Expression: None, normal Lips and Perioral Area: None, normal Jaw: None, normal Tongue: None, normal,Extremity Movements Upper (arms, wrists, hands, fingers):  None, normal Lower (legs, knees, ankles, toes): None, normal, Trunk Movements Neck, shoulders, hips: None, normal, Overall Severity Severity of abnormal movements (highest score from questions above): None, normal Incapacitation due to abnormal movements: None, normal Patient's awareness of abnormal movements (rate only patient's report): No Awareness, Dental Status Current problems with teeth and/or dentures?: No Does patient usually wear dentures?: No  CIWA:  CIWA-Ar Total: 1 COWS:  COWS Total Score: 2  Musculoskeletal: Strength & Muscle Tone: within normal limits Gait & Station: normal Patient leans: N/A  Psychiatric Specialty Exam: Physical Exam  Constitutional: He appears well-developed.  HENT:  Head: Normocephalic.  Neurological: He is alert.  Psychiatric: He has a normal mood and affect.    Review of Systems  Psychiatric/Behavioral: Positive for depression, hallucinations and suicidal ideas. The patient has insomnia.     Blood pressure 98/68, pulse 95, temperature 97.7 F (36.5 C), temperature source Oral, resp. rate 18, height 6' 2" (1.88 m), weight 117 kg (258 lb).Body mass index is 33.13 kg/m.  General Appearance: Fairly Groomed  Eye Contact:  Good  Speech:  Normal Rate  Volume:  Normal  Mood:  improving compared to admission   Affect:  less constricted   Thought Process:  Linear and Descriptions of Associations: Intact  Orientation:  Other:  fully alert and attentive  Thought Content:  reports decreased hallucinations, and does not appear internally preoccupied   Suicidal Thoughts:  No -  Homicidal Thoughts:  No  Memory:  Recent and remote grossly intact  Judgement:    Fair- improving   Insight:  Present  Psychomotor Activity:  Normal  Concentration:  Concentration: Good and Attention Span: Good  Recall:  Good  Fund of Knowledge:  Good  Language:  Good  Akathisia:  No  Handed:  Right  AIMS (if indicated):     Assets:  Communication Skills Desire for  Improvement Financial Resources/Insurance Resilience Social Support  ADL's:  Intact  Cognition:  WNL  Sleep:  Number of Hours: 6.75   Assessment - patient is presenting with ongoing improvement in mood and psychotic symptoms as per his own report and RN report. Affect is less constricted and hallucinations have decreased. Tolerating medications well .  Treatment Plan Summary: Treatment plan reviewed as below today 10/16  Daily contact with patient to assess and evaluate symptoms and progress in treatment and Medication management Encourage ongoing group and milieu participation to work on coping skills and symptom reduction Continue  Neurontin 300 mg BID for pain and anxiety Continue Risperdal 1 mg BID for psychosis and mood  Continue Zoloft  50 mgrs QDAY for depression  Continue Trazodone 100 mg PRN for insomnia as needed  Treatment team working on disposition planning options   T , MD 06/20/2017, 7:00 PM   Patient ID: Kairos J Cadotte, male   DOB: 03/11/1961, 55 y.o.   MRN: [id number]   

## 2017-06-20 NOTE — [facility] Group Notes (Signed)
LCSW Group Therapy Note  06/20/2017 1:15pm  Type of Therapy/Topic:  Group Therapy:  Balance in Life  Participation Level:  Active  Description of Group:    This group will address the concept of balance and how it feels and looks when one is unbalanced. Patients will be encouraged to process areas in their lives that are out of balance and identify reasons for remaining unbalanced. Facilitators will guide patients in utilizing problem-solving interventions to address and correct the stressor making their life unbalanced. Understanding and applying boundaries will be explored and addressed for obtaining and maintaining a balanced life. Patients will be encouraged to explore ways to assertively make their unbalanced needs known to significant others in their lives, using other group members and facilitator for support and feedback.  Therapeutic Goals: 1. Patient will identify two or more emotions or situations they have that consume much of in their lives. 2. Patient will identify signs/triggers that life has become out of balance:  3. Patient will identify two ways to set boundaries in order to achieve balance in their lives:  4. Patient will demonstrate ability to communicate their needs through discussion and/or role plays  Summary of Patient Progress:  Royal HawthornKarl attended group but he was in and out of the room.  He was an active participant. When he feels anxious he feels jumpy and jittery.   When he feels this way he tries to ignore the feelings and focus on something else.  He has a meditation ritual that he enjoys using to find emotional balance.      Therapeutic Modalities:   Cognitive Behavioral Therapy Solution-Focused Therapy Assertiveness Training  Aram Beechamngel M Jarquavious Fentress, Student-Social Work 06/20/2017 1:33 PM

## 2017-06-20 NOTE — Progress Notes (Signed)
Patient ID: Charles PortaKarl J Courtois, male   DOB: 05-11-1961, 56 y.o.   MRN: [id number]  DAR: Pt. Denies HI and A/V Hallucinations. He reports passive SI, he is able to contract for safety. He reports that his sleep last night was fair, his appetite is fair, energy level is low, and concentration is poor. He rates his depression level 7/10, hopelessness level 7/10, and anxiety level 5/10. Support and encouragement provided to the patient. Scheduled medications administered to patient per physician's orders. PRN Tylenol administered to patient for some pain. Patient is minimal with Clinical research associatewriter but cooperative. He is seen in the milieu and interacts with some of his peers. He reports that his goal is to work on finding housing. Q15 minute checks are maintained for safety.

## 2017-06-21 DIAGNOSIS — F1421 Cocaine dependence, in remission: Secondary | ICD-10-CM

## 2017-06-21 DIAGNOSIS — M549 Dorsalgia, unspecified: Secondary | ICD-10-CM

## 2017-06-21 DIAGNOSIS — F1910 Other psychoactive substance abuse, uncomplicated: Secondary | ICD-10-CM

## 2017-06-21 LAB — GLUCOSE, CAPILLARY
GLUCOSE-CAPILLARY: 165 mg/dL — AB (ref 65–99)
GLUCOSE-CAPILLARY: 218 mg/dL — AB (ref 65–99)
GLUCOSE-CAPILLARY: 394 mg/dL — AB (ref 65–99)
GLUCOSE-CAPILLARY: 348 mg/dL — AB (ref 65–99)

## 2017-06-21 MED ORDER — IBUPROFEN 800 MG PO TABS
800.0000 mg | ORAL_TABLET | Freq: Three times a day (TID) | ORAL | Status: AC
  Administered 2017-06-21 – 2017-06-24 (×9): 800 mg via ORAL
  Filled 2017-06-21 (×12): qty 1

## 2017-06-21 NOTE — Progress Notes (Signed)
Adult Psychoeducational Group Note  Date:  06/21/2017 Time:  9:10 PM  Group Topic/Focus:  Wrap-Up Group:   The focus of this group is to help patients review their daily goal of treatment and discuss progress on daily workbooks.  Participation Level:  Did Not Attend  Participation Quality:  Did not attend  Affect:  Did not attend  Cognitive:  Did not attend  Insight: None  Engagement in Group:  Did not attend  Modes of Intervention:  Did not attend  Additional Comments:  Patient did not attend wrap up group tonight.   Quasean Frye L Jaycee Mckellips 06/21/2017, 9:10 PM

## 2017-06-21 NOTE — [facility] Group Notes (Signed)
LCSW Group Therapy 06/21/2017 1:15pm  Type of Therapy and Topic:  Group Therapy:  Change and Accountability  Participation Level:  Did Not Attend  Description of Group In this group, patients discussed power and accountability for change.  The group identified the challenges related to accountability and the difficulty of accepting the outcomes of negative behaviors.  Patients were encouraged to openly discuss a challenge/change they could take responsibility for.  Patients discussed the use of "change talk" and positive thinking as ways to support achievement of personal goals.  The group discussed ways to give support and empowerment to peers.  Therapeutic Goals: 1. Patients will state the relationship between personal power and accountability in the change process 2. Patients will identify the positive and negative consequences of a personal choice they have made 3. Patients will identify one challenge/choice they will take responsibility for making 4. Patients will discuss the role of "change talk" and the impact of positive thinking as it supports successful personal change 5. Patients will verbalize support and affirmation of change efforts in peers  Summary of Patient Progress:    Therapeutic Modalities Solution Focused Brief Therapy Motivational Interviewing Cognitive Behavioral Therapy  Charles Orr, Student-Social Work 06/21/2017 1:51 PM

## 2017-06-21 NOTE — Progress Notes (Signed)
Patient ID: Charles Orr, male   DOB: 1961-05-12, 56 y.o.   MRN: [id number]  D: Patient observed watching TV and interacting well with peers on approach. Pt reports he is doing well but appears depressed over his back pain. Pt reports minimal relieve from medications.  Pt attended evening wrap up group and engaged in discussions.  Denies  SI/HI/AVH.No behavioral issues noted.  A: Support and encouragement offered as needed to express needs. Medications administered as prescribed.  R: Patient is safe and cooperative on unit. Will continue to monitor  for safety and stability.

## 2017-06-21 NOTE — Tx Team (Signed)
Interdisciplinary Treatment and Diagnostic Plan Update  06/21/2017 Time of Session: 9:00 AM Charles Orr MRN: [id number]  Principal Diagnosis: Bipolar disorder with psychotic features [facility])  Secondary Diagnoses: Principal Problem:   Bipolar disorder with psychotic features ([place])   Current Medications:  Current Facility-Administered Medications  Medication Dose Route Frequency Provider Last Rate Last Dose  . acetaminophen (TYLENOL) tablet 650 mg  650 mg Oral Q6H PRN Lindon Romp A, NP   650 mg at 06/21/17 0820  . aspirin EC tablet 81 mg  81 mg Oral Daily Okonkwo, Justina A, NP   81 mg at 06/21/17 0810  . atorvastatin (LIPITOR) tablet 40 mg  40 mg Oral q1800 Okonkwo, Justina A, NP   40 mg at 06/20/17 1719  . gabapentin (NEURONTIN) capsule 300 mg  300 mg Oral BID Okonkwo, Justina A, NP   300 mg at 06/21/17 0810  . hydrochlorothiazide (HYDRODIURIL) tablet 25 mg  25 mg Oral Daily Okonkwo, Justina A, NP   25 mg at 06/21/17 0810  . hydrOXYzine (ATARAX/VISTARIL) tablet 25 mg  25 mg Oral TID PRN Hughie Closs A, NP   25 mg at 06/20/17 2103  . insulin aspart (novoLOG) injection 0-15 Units  0-15 Units Subcutaneous TID WC Lindon Romp A, NP   3 Units at 06/21/17 0631  . insulin aspart (novoLOG) injection 0-5 Units  0-5 Units Subcutaneous QHS Lindon Romp A, NP   4 Units at 06/20/17 2103  . insulin aspart protamine- aspart (NOVOLOG MIX 70/30) injection 60 Units  60 Units Subcutaneous Q breakfast Okonkwo, Justina A, NP   60 Units at 06/21/17 0818   And  . insulin aspart protamine- aspart (NOVOLOG MIX 70/30) injection 45 Units  45 Units Subcutaneous Q supper Okonkwo, Justina A, NP   45 Units at 06/20/17 1717  . levofloxacin (LEVAQUIN) tablet 750 mg  750 mg Oral Daily Okonkwo, Justina A, NP   750 mg at 06/21/17 0816  . lidocaine (LIDODERM) 5 % 1 patch  1 patch Transdermal Daily Cobos, Myer Peer, MD   1 patch at 06/21/17 [phone]  . metoprolol tartrate (LOPRESSOR) tablet 25 mg  25 mg Oral BID Okonkwo,  Justina A, NP   25 mg at 06/21/17 0810  . multivitamin with minerals tablet 1 tablet  1 tablet Oral Daily Okonkwo, Justina A, NP   1 tablet at 06/21/17 0810  . nicotine (NICODERM CQ - dosed in mg/24 hours) patch 14 mg  14 mg Transdermal Daily Cobos, Myer Peer, MD   14 mg at 06/21/17 0811  . risperiDONE (RISPERDAL) tablet 1 mg  1 mg Oral BID Cobos, Myer Peer, MD   1 mg at 06/21/17 0810  . sertraline (ZOLOFT) tablet 50 mg  50 mg Oral Daily Cobos, Myer Peer, MD   50 mg at 06/21/17 0810  . traZODone (DESYREL) tablet 100 mg  100 mg Oral QHS PRN Cobos, Myer Peer, MD   100 mg at 06/20/17 2101   PTA Medications: Prescriptions Prior to Admission  Medication Sig Dispense Refill Last Dose  . acetaminophen (TYLENOL) 325 MG tablet Take 1 tablet (325 mg total) by mouth every 6 (six) hours as needed. (Patient taking differently: Take 1,300 mg by mouth every 6 (six) hours as needed for mild pain. ) 120 tablet 0 06/14/2017 at Unknown time  . ARIPiprazole (ABILIFY) 10 MG tablet Take 1 tablet (10 mg total) by mouth daily. 30 tablet 0 06/13/2017 at Unknown time  . aspirin 81 MG EC tablet Take 1 tablet (81 mg total)  by mouth daily. 30 tablet 0 06/13/2017 at Unknown time  . atorvastatin (LIPITOR) 40 MG tablet Take 1 tablet (40 mg total) by mouth daily. 30 tablet 0 06/13/2017 at Unknown time  . gabapentin (NEURONTIN) 300 MG capsule Take 1 capsule (300 mg total) by mouth 2 (two) times daily. 60 capsule 0 06/13/2017 at Unknown time  . hydrochlorothiazide (HYDRODIURIL) 25 MG tablet Take 1 tablet (25 mg total) by mouth daily. 30 tablet 0 06/13/2017 at Unknown time  . insulin aspart protamine- aspart (NOVOLOG MIX 70/30) (70-30) 100 UNIT/ML injection Inject 0.45-0.6 mLs (45-60 Units total) into the skin See admin instructions. Inject 60 units subcutaneously every morning and 45 units at night (Patient taking differently: Inject 45-60 Units into the skin See admin instructions. Inject 65 units subcutaneously every morning and  45 units at night) 50 mL 0 06/14/2017 at Unknown time  . levofloxacin (LEVAQUIN) 750 MG tablet Take 1 tablet (750 mg total) by mouth daily. 4 tablet 0 06/13/2017 at Unknown time  . metoprolol tartrate (LOPRESSOR) 25 MG tablet Take 1 tablet (25 mg total) by mouth 2 (two) times daily. 60 tablet 0 06/13/2017 at 1500  . Multiple Vitamin (MULTIVITAMIN WITH MINERALS) TABS tablet Take 1 tablet by mouth daily.   06/13/2017 at Unknown time  . polyethylene glycol (MIRALAX / GLYCOLAX) packet Take 17 g by mouth 2 (two) times daily. 14 each 0 06/13/2017 at Unknown time    Patient Stressors: Financial difficulties Medication change or noncompliance  Patient Strengths: General fund of knowledge Motivation for treatment/growth Supportive family/friends  Treatment Modalities: Medication Management, Group therapy, Case management,  1 to 1 session with clinician, Psychoeducation, Recreational therapy.   Physician Treatment Plan for Primary Diagnosis: Bipolar disorder with psychotic features ([place]) Long Term Goal(s): Improvement in symptoms so as ready for discharge Improvement in symptoms so as ready for discharge   Short Term Goals: Ability to identify changes in lifestyle to reduce recurrence of condition will improve Ability to disclose and discuss suicidal ideas Ability to demonstrate self-control will improve Ability to identify changes in lifestyle to reduce recurrence of condition will improve Ability to verbalize feelings will improve Ability to maintain clinical measurements within normal limits will improve Compliance with prescribed medications will improve  Medication Management: Evaluate patient's response, side effects, and tolerance of medication regimen.  Therapeutic Interventions: 1 to 1 sessions, Unit Group sessions and Medication administration.  Evaluation of Outcomes: Progressing  Physician Treatment Plan for Secondary Diagnosis: Principal Problem:   Bipolar disorder with  psychotic features ([place])  Long Term Goal(s): Improvement in symptoms so as ready for discharge Improvement in symptoms so as ready for discharge   Short Term Goals: Ability to identify changes in lifestyle to reduce recurrence of condition will improve Ability to disclose and discuss suicidal ideas Ability to demonstrate self-control will improve Ability to identify changes in lifestyle to reduce recurrence of condition will improve Ability to verbalize feelings will improve Ability to maintain clinical measurements within normal limits will improve Compliance with prescribed medications will improve     Medication Management: Evaluate patient's response, side effects, and tolerance of medication regimen.  Therapeutic Interventions: 1 to 1 sessions, Unit Group sessions and Medication administration.  Evaluation of Outcomes: Progressing   10/18: Patient is presenting with ongoing improvement in mood and psychotic symptoms as per his own report and RN report. Affect is less constricted and hallucinations have decreased.  -Continue  Neurontin 300 mg BID for pain and anxiety -Continue Risperdal 1 mg BID for  psychosis and mood  -Continue Zoloft  50 mgrs QDAY for depression  -Continue Trazodone 100 mg PRN for insomnia as needed. -Initiated Ibuprofen 800 mg tid x 3 days for back pain.   RN Treatment Plan for Primary Diagnosis: Bipolar disorder with psychotic features ([place]) Long Term Goal(s): Knowledge of disease and therapeutic regimen to maintain health will improve  Short Term Goals: Ability to participate in decision making will improve, Ability to verbalize feelings will improve and Ability to disclose and discuss suicidal ideas  Medication Management: RN will administer medications as ordered by provider, will assess and evaluate patient's response and provide education to patient for prescribed medication. RN will report any adverse and/or side effects to prescribing  provider.  Therapeutic Interventions: 1 on 1 counseling sessions, Psychoeducation, Medication administration, Evaluate responses to treatment, Monitor vital signs and CBGs as ordered, Perform/monitor CIWA, COWS, AIMS and Fall Risk screenings as ordered, Perform wound care treatments as ordered.  Evaluation of Outcomes: Progressing   LCSW Treatment Plan for Primary Diagnosis: Bipolar disorder with psychotic features [facility]) Long Term Goal(s): Safe transition to appropriate next level of care at discharge, Engage patient in therapeutic group addressing interpersonal concerns.  Short Term Goals: Engage patient in aftercare planning with referrals and resources, Increase ability to appropriately verbalize feelings, Increase emotional regulation, Identify triggers associated with mental health/substance abuse issues and Increase skills for wellness and recovery  Therapeutic Interventions: Assess for all discharge needs, 1 to 1 time with Social worker, Explore available resources and support systems, Assess for adequacy in community support network, Educate family and significant other(s) on suicide prevention, Complete Psychosocial Assessment, Interpersonal group therapy.  Evaluation of Outcomes: Not Met   Progress in Treatment: Attending groups: Yes. Participating in groups: Yes. Taking medication as prescribed: Yes. Toleration medication: Yes. Family/Significant other contact made: No, will contact:  collateral w patient consent; currently declining Patient understands diagnosis: Yes.  CSW assessing Discussing patient identified problems/goals with staff: Yes. Medical problems stabilized or resolved: Yes. Denies suicidal/homicidal ideation: Yes. contracts for safety on unit, admitted w suicidal ideation, coping skills inadequate for community stressors Issues/concerns per patient self-inventory: No. Other: NA  New problem(s) identified: Yes, Describe:  patient lives in [place], states he  is looking for son in [place] but [place] know where he is; potentially homeless and lacks access to resources  New Short Term/Long Term Goal(s):  Wants stability in housing, medication stability, increased coping skills  Discharge Plan or Barriers:Pt states he needs to get into a shelter if d/ced before the end of the month as he has no money until his next disability check.  Will follow up at closest [facility] clinic to shelter in which he is accepted.  He is calling both [facility] and [facility] starting today  Reason for Continuation of Hospitalization: Depression Hallucinations Medication stabilization  Estimated Length of Stay:  06/26/17  Attendees: Patient: 06/21/2017 11:12 AM  Physician: Bonne Dolores MD 06/21/2017 11:12 AM  Nursing: Jackie Plum RN 06/21/2017 11:12 AM  RN Care Manager: 06/21/2017 11:12 AM  Social Worker: Ripley Fraise LCSW 06/21/2017 11:12 AM  Recreational Therapist:  06/21/2017 11:12 AM  Other:  06/21/2017 11:12 AM  Other:  06/21/2017 11:12 AM  Other: 06/21/2017 11:12 AM    Scribe for Treatment Team: Trish Mage, LCSW 06/21/2017 11:12 AM

## 2017-06-21 NOTE — Progress Notes (Signed)
DAR NOTE: Patient presents with flat affect and depressed mood.  Denies pain, auditory and visual hallucinations.  Reports suicidal thoughts on self inventory form but contracts for safety.  Rates depression at 6, hopelessness at 6, and anxiety at 7.  Maintained on routine safety checks.  Medications given as prescribed.  Support and encouragement offered as needed.  Attended group and participated.  States goal for today is "housing."  Patient visible in the dayroom with minimal interaction with staff and peers.

## 2017-06-21 NOTE — Progress Notes (Signed)
Inpatient Diabetes Program Recommendations  AACE/ADA: New Consensus Statement on Inpatient Glycemic Control (2015)  Target Ranges:  Prepandial:   less than 140 mg/dL      Peak postprandial:   less than 180 mg/dL (1-2 hours)      Critically ill patients:  140 - 180 mg/dL   Lab Results  Component Value Date   GLUCAP 165 (H) 06/21/2017   HGBA1C 9.5 (H) 06/10/2017    Review of Glycemic Control  Diabetes history: DM2 Outpatient Diabetes medications: 70/30 65 in am and 45 in pm Current orders for Inpatient glycemic control: 70/30 60 units QAM and 45 units QPM, Novolog 0-15 units tidwc and hs  HgbA1C of 9.5% indicates sub-par glycemic control.  Inpatient Diabetes Program Recommendations:    Increase 70/30 to 65 units QAM and 50 units QPM  Will need to obtain a PCP for management of diabetes. Case manager assistance? Will need to modify 70/30 if pt is not eating well.  Continue to follow.  Thank you. Ailene Ardshonda Yassine Brunsman, RD, LDN, CDE Inpatient Diabetes Coordinator [phone]

## 2017-06-21 NOTE — Plan of Care (Signed)
Problem: Medication: Goal: Compliance with prescribed medication regimen will improve Outcome: Progressing Patient is compliant with prescribed medication regimen.   

## 2017-06-21 NOTE — Progress Notes (Signed)
Patient ID: Charles Orr, male   DOB: 03-29-1961, 56 y.o.   MRN: [id number]  D: Patient has been in his room sleeping since beginning of shift. Pt reports he is doing well. Pt reports having AH without command but is minimal and cannot make out what the voices are saying. Denies  SI/HI.No behavioral issues noted.  A: Support and encouragement offered as needed to express needs. Medications administered as prescribed.  R: Patient is safe and cooperative on unit. Will continue to monitor  for safety and stability.

## 2017-06-21 NOTE — Progress Notes (Signed)
Patient ID: Charles Orr, male   DOB: [birth year], 56 y.o.   MRN: [id number]   [facility] MD Progress Note  06/21/2017 4:09 PM Charles Orr  MRN:  [id number]   Subjective: Charles Orr reports, "My mood is not the best in the world today. It is because I'm in pain. My back is hurting so bad that I can't stand it. I have taken Tylenol, ain't helping it. The voices are still there, just not as loud. I still think about suicide off & on. But, I feel safe here".  Objective: Charles Orr is seen, chart reviewed. I have discussed case with the treatment team. Staff reports patient is improving - remains depressed, with constricted  Affect. Says his depression is more or less related to his chronic back pain. At this time is endorsing passive suicidal ideations, denies any plans or intent to harm himself. He is able to contract for safety. He is also endorsing auditory hallucinations, only that the voices are not as bad. He has been visible on day room, going to some groups, polite on approach. Staff continue to provide support. Ibuprofen 800 mg ordered tid x 3 days. Denies ant medication adverse effects.  Principal Problem: Bipolar disorder with psychotic features [facility]) Diagnosis:   Patient Active Problem List   Diagnosis Date Noted  . Bipolar disorder with psychotic features (HCC) [F31.9] 06/16/2017  . Depression [F32.9] 06/15/2017  . Other schizophrenia (HCC) [F20.89]   . Pneumonia [J18.9] 06/11/2017  . Community acquired pneumonia of right lower lobe of lung (HCC) [J18.1]   . Hyperglycemia [R73.9]   . Localized edema [R60.0]   . Shortness of breath [R06.02] 06/10/2017   Total Time spent with patient: 15 minutes  Past Psychiatric History: Schizophrenia.  Past Medical History:  Past Medical History:  Diagnosis Date  . Anxiety   . Arthritis    "back" (06/12/2017)  . Bipolar disorder (HCC)   . CAP (community acquired pneumonia) 06/10/2017  . Chronic lower back pain   . Degenerative disc disease, lumbar   .  Depression   . Fibromyalgia   . GERD (gastroesophageal reflux disease)   . High cholesterol   . Hypertension   . Myocardial infarction (HCC) 2002  . Pneumonia 1989  . Schizophrenia (HCC)   . Type II diabetes mellitus (HCC)     Past Surgical History:  Procedure Laterality Date  . CARDIAC CATHETERIZATION  12/2006   [name] 01/18/2011  . INGUINAL HERNIA REPAIR Right   . PARATHYROIDECTOMY    . PERICARDIAL TAP  2000   ?; in [place]/notes 01/18/2011   Family History: History reviewed. No pertinent family history.  Family Psychiatric  History: See H&P  Social History:  History  Alcohol Use  . Yes    Comment: 06/12/2017 "stopped ~ 1 yr ago"      History  Drug Use  . Types: Cocaine    Comment: 06/12/2017 "stopped using cocaine ~ 1 yr ago; used marijuana when I was younger" last cocaine use 9 mths ago per pt on 06/15/17    Social History   Social History  . Marital status: Divorced    Spouse name: N/A  . Number of children: N/A  . Years of education: N/A   Social History Main Topics  . Smoking status: Current Every Day Smoker    Packs/day: 0.50    Years: 38.00    Types: Cigarettes  . Smokeless tobacco: Current User    Types: Chew  . Alcohol use Yes     Comment:  06/12/2017 "stopped ~ 1 yr ago"   . Drug use: Yes    Types: Cocaine     Comment: 06/12/2017 "stopped using cocaine ~ 1 yr ago; used marijuana when I was younger" last cocaine use 9 mths ago per pt on 06/15/17  . Sexual activity: Not Currently   Other Topics Concern  . None   Social History Narrative  . None   Additional Social History:   Sleep: improving , states he slept better last night   Appetite:  fair- but states improved compared to admission  Current Medications: Current Facility-Administered Medications  Medication Dose Route Frequency Provider Last Rate Last Dose  . acetaminophen (TYLENOL) tablet 650 mg  650 mg Oral Q6H PRN Nira Conn A, NP   650 mg at 06/21/17 0820  . aspirin EC tablet 81 mg   81 mg Oral Daily Okonkwo, Justina A, NP   81 mg at 06/21/17 0810  . atorvastatin (LIPITOR) tablet 40 mg  40 mg Oral q1800 Okonkwo, Justina A, NP   40 mg at 06/20/17 1719  . gabapentin (NEURONTIN) capsule 300 mg  300 mg Oral BID Okonkwo, Justina A, NP   300 mg at 06/21/17 0810  . hydrochlorothiazide (HYDRODIURIL) tablet 25 mg  25 mg Oral Daily Okonkwo, Justina A, NP   25 mg at 06/21/17 0810  . hydrOXYzine (ATARAX/VISTARIL) tablet 25 mg  25 mg Oral TID PRN Ferne Reus A, NP   25 mg at 06/20/17 2103  . ibuprofen (ADVIL,MOTRIN) tablet 800 mg  800 mg Oral TID Armandina Stammer I, NP      . insulin aspart (novoLOG) injection 0-15 Units  0-15 Units Subcutaneous TID WC Nira Conn A, NP   5 Units at 06/21/17 1154  . insulin aspart (novoLOG) injection 0-5 Units  0-5 Units Subcutaneous QHS Nira Conn A, NP   4 Units at 06/20/17 2103  . insulin aspart protamine- aspart (NOVOLOG MIX 70/30) injection 60 Units  60 Units Subcutaneous Q breakfast Okonkwo, Justina A, NP   60 Units at 06/21/17 0818   And  . insulin aspart protamine- aspart (NOVOLOG MIX 70/30) injection 45 Units  45 Units Subcutaneous Q supper Okonkwo, Justina A, NP   45 Units at 06/20/17 1717  . levofloxacin (LEVAQUIN) tablet 750 mg  750 mg Oral Daily Okonkwo, Justina A, NP   750 mg at 06/21/17 0816  . lidocaine (LIDODERM) 5 % 1 patch  1 patch Transdermal Daily Cobos, Rockey Situ, MD   1 patch at 06/21/17 [phone]  . metoprolol tartrate (LOPRESSOR) tablet 25 mg  25 mg Oral BID Okonkwo, Justina A, NP   25 mg at 06/21/17 0810  . multivitamin with minerals tablet 1 tablet  1 tablet Oral Daily Okonkwo, Justina A, NP   1 tablet at 06/21/17 0810  . nicotine (NICODERM CQ - dosed in mg/24 hours) patch 14 mg  14 mg Transdermal Daily Cobos, Rockey Situ, MD   14 mg at 06/21/17 0811  . risperiDONE (RISPERDAL) tablet 1 mg  1 mg Oral BID Cobos, Rockey Situ, MD   1 mg at 06/21/17 0810  . sertraline (ZOLOFT) tablet 50 mg  50 mg Oral Daily Cobos, Rockey Situ, MD   50 mg  at 06/21/17 0810  . traZODone (DESYREL) tablet 100 mg  100 mg Oral QHS PRN Cobos, Rockey Situ, MD   100 mg at 06/20/17 2101   Lab Results:  Results for orders placed or performed during the hospital encounter of 06/16/17 (from the past 48 hour(s))  Glucose, capillary     Status: Abnormal   Collection Time: 06/19/17  5:02 PM  Result Value Ref Range   Glucose-Capillary 320 (H) 65 - 99 mg/dL   Comment 1 Notify RN    Comment 2 Document in Chart   Glucose, capillary     Status: Abnormal   Collection Time: 06/19/17  8:37 PM  Result Value Ref Range   Glucose-Capillary 340 (H) 65 - 99 mg/dL  Glucose, capillary     Status: Abnormal   Collection Time: 06/20/17  6:09 AM  Result Value Ref Range   Glucose-Capillary 248 (H) 65 - 99 mg/dL  Glucose, capillary     Status: Abnormal   Collection Time: 06/20/17 11:56 AM  Result Value Ref Range   Glucose-Capillary 193 (H) 65 - 99 mg/dL  Glucose, capillary     Status: Abnormal   Collection Time: 06/20/17  4:58 PM  Result Value Ref Range   Glucose-Capillary 311 (H) 65 - 99 mg/dL   Comment 1 Notify RN    Comment 2 Document in Chart   Glucose, capillary     Status: Abnormal   Collection Time: 06/20/17  8:14 PM  Result Value Ref Range   Glucose-Capillary 335 (H) 65 - 99 mg/dL  Glucose, capillary     Status: Abnormal   Collection Time: 06/21/17  6:23 AM  Result Value Ref Range   Glucose-Capillary 165 (H) 65 - 99 mg/dL  Glucose, capillary     Status: Abnormal   Collection Time: 06/21/17 11:49 AM  Result Value Ref Range   Glucose-Capillary 218 (H) 65 - 99 mg/dL   Blood Alcohol level:  Lab Results  Component Value Date   ETH <10 06/15/2017   ETH <10 06/14/2017   Metabolic Disorder Labs: Lab Results  Component Value Date   HGBA1C 9.5 (H) 06/10/2017   MPG 225.95 06/10/2017   No results found for: PROLACTIN Lab Results  Component Value Date   CHOL 85 06/10/2017   TRIG 101 06/10/2017   HDL 22 (L) 06/10/2017   CHOLHDL 3.9 06/10/2017   VLDL  20 06/10/2017   LDLCALC 43 06/10/2017   Physical Findings: AIMS: Facial and Oral Movements Muscles of Facial Expression: None, normal Lips and Perioral Area: None, normal Jaw: None, normal Tongue: None, normal,Extremity Movements Upper (arms, wrists, hands, fingers): None, normal Lower (legs, knees, ankles, toes): None, normal, Trunk Movements Neck, shoulders, hips: None, normal, Overall Severity Severity of abnormal movements (highest score from questions above): None, normal Incapacitation due to abnormal movements: None, normal Patient's awareness of abnormal movements (rate only patient's report): No Awareness, Dental Status Current problems with teeth and/or dentures?: No Does patient usually wear dentures?: No  CIWA:  CIWA-Ar Total: 1 COWS:  COWS Total Score: 2  Musculoskeletal: Strength & Muscle Tone: within normal limits Gait & Station: normal Patient leans: N/A  Psychiatric Specialty Exam: Physical Exam  Nursing note and vitals reviewed. Constitutional: He appears well-developed.  HENT:  Head: Normocephalic.  Neurological: He is alert.  Psychiatric: He has a normal mood and affect.    Review of Systems  Psychiatric/Behavioral: Positive for depression, hallucinations, substance abuse (Hx. Opioid use) and suicidal ideas. Negative for memory loss. The patient has insomnia. The patient is not nervous/anxious.     Blood pressure 119/85, pulse 89, temperature 98.2 F (36.8 C), temperature source Oral, resp. rate 16, height 6\' 2"  (1.88 m), weight 117 kg (258 lb).Body mass index is 33.13 kg/m.  General Appearance: Fairly Groomed  Eye Contact:  Good  Speech:  Normal Rate  Volume:  Normal  Mood:  improving compared to admission   Affect:  less constricted   Thought Process:  Linear and Descriptions of Associations: Intact  Orientation:  Other:  fully alert and attentive  Thought Content:  reports decreased hallucinations, and does not appear internally preoccupied    Suicidal Thoughts:  No -  Homicidal Thoughts:  No  Memory:  Recent and remote grossly intact  Judgement:  Fair- improving   Insight:  Present  Psychomotor Activity:  Normal  Concentration:  Concentration: Good and Attention Span: Good  Recall:  Good  Fund of Knowledge:  Good  Language:  Good  Akathisia:  No  Handed:  Right  AIMS (if indicated):     Assets:  Communication Skills Desire for Improvement Financial Resources/Insurance Resilience Social Support  ADL's:  Intact  Cognition:  WNL  Sleep:  Number of Hours: 6.5   Assessment - Patient is presenting with ongoing improvement in mood and psychotic symptoms as per his own report and RN report. Affect is less constricted and hallucinations have decreased. Tolerating medications well .  Treatment Plan Summary: Treatment plan reviewed as below today 06/21/17  Daily contact with patient to assess and evaluate symptoms and progress in treatment and Medication management Encourage ongoing group and milieu participation to work on coping skills and symptom reduction -Continue  Neurontin 300 mg BID for pain and anxiety -Continue Risperdal 1 mg BID for psychosis and mood  -Continue Zoloft  50 mgrs QDAY for depression  -Continue Trazodone 100 mg PRN for insomnia as needed. -Initiated Ibuprofen 800 mg tid x 3 days for back pain. Treatment team working on disposition planning options  Sanjuana Kava, NP, PMHNP, FNP-BC. 06/21/2017, 4:09 PM  Patient ID: [name], male   DOB: [birth year], 56 y.o.   MRN: [id number]

## 2017-06-21 NOTE — Progress Notes (Signed)
Recreation Therapy Notes  Date: 06/21/17 Time: 1000 Location: 500 Hall Dayroom  Group Topic: Wellness  Goal Area(s) Addresses:  Patient will define components of whole wellness. Patient will verbalize benefit of whole wellness.  Intervention:  2 Decks of Cards  Activity: Deck of Chance.  Patients were given 2 cards from one deck of cards.  LRT would pull a card from another deck of cards.  Whatever card LRT pulled, patients with the corresponding card would have to complete the associated exercise.  Education: Wellness, Building control surveyorDischarge Planning.   Education Outcome: Acknowledges education/In group clarification offered/Needs additional education.   Clinical Observations/Feedback: Pt did not attend group.   Caroll RancherMarjette Bliss Behnke, LRT/CTRS         Caroll RancherLindsay, Lysandra Loughmiller A 06/21/2017 12:26 PM

## 2017-06-22 LAB — GLUCOSE, CAPILLARY
GLUCOSE-CAPILLARY: 164 mg/dL — AB (ref 65–99)
GLUCOSE-CAPILLARY: 196 mg/dL — AB (ref 65–99)
Glucose-Capillary: 260 mg/dL — ABNORMAL HIGH (ref 65–99)
Glucose-Capillary: 269 mg/dL — ABNORMAL HIGH (ref 65–99)

## 2017-06-22 NOTE — Progress Notes (Signed)
Recreation Therapy Notes  Date: 06/22/17 Time: 1000 Location: 500 [address] Dayroom  Group Topic: Leisure Education  Goal Area(s) Addresses:  Patient will identify positive leisure activities.  Patient will identify one positive benefit of participation in leisure activities.   Intervention: Chairs, small beach ball  Activity: Keep It Going Volleyball.  LRT seated patients in a circle.  Patients were to toss the ball back and fourth to each other without letting the ball come to a stop.  Patients could bounce the ball off the floor but the wall was to remain moving at all times.  LRT would count the number of hits on the ball.  If the ball stopped the count would start over.  Education:  Leisure Education, Building control surveyorDischarge Planning  Education Outcome: Acknowledges education/In group clarification offered/Needs additional education  Clinical Observations/Feedback: Pt did not attend group.   Caroll RancherMarjette Dyamond [place]osa, LRT/CTRS         Caroll RancherLindsay, Zaryiah Barz A 06/22/2017 12:26 PM

## 2017-06-22 NOTE — Progress Notes (Signed)
DAR NOTE: Patient presents with flat affect and depressed mood.  Reports auditory hallucination and suicidal thoughts but contracts for safety.  Rates depression at 6, hopelessness at 6, and anxiety at 7.  Maintained on routine safety checks.  Medications given as prescribed.  Support and encouragement offered as needed.  Attended group and participated.  States goal for today is "housing."  Patient observed socializing with peers in the dayroom.  Offered no complaint.

## 2017-06-22 NOTE — Plan of Care (Signed)
Problem: Safety: Goal: Periods of time without injury will increase Outcome: Progressing Patient is safe and free from injury.   

## 2017-06-22 NOTE — Progress Notes (Signed)
Patient ID: Charles Orr, male   DOB: 1961/06/03, 56 y.o.   MRN: [id number]   [facility] MD Progress Note  06/22/2017 3:44 PM ARCHIT LEGER  MRN:  [id number]   Subjective: [name] reports, "I'm still having the highs & lows. It is worst when I can't fight the voices. Most of the time, I can deal with the voices, other times, I could no longer fight it, mostly when they are telling me that I'm no good or my life ain't worth living. It goes on & on that I will start to believe the voices. Just like today, I got to start feeling sorry for myself, start thinking, may be, I should go ahead & die. I got this back pain that ain't going no where. I tell you, I ain't any good today"  Objective: [name] is seen, chart reviewed. I have discussed case with the treatment team. Staff reports patient is improving - remains depressed, with constricted  Affect. Says his depression is more or less related to his chronic back pain & the auditory hallucinations. At this time is endorsing passive suicidal ideations, denies any plans or intent to harm himself. He is able to contract for safety. He is also endorsing auditory hallucinations that are mocking him that his life is not worth living. He has been visible on the unit & in the day room, going to some groups, polite on approach. Staff continue to provide support. Ibuprofen 800 mg ordered tid x 3 days, denies any side effects.  Principal Problem: Bipolar disorder with psychotic features [facility]) Diagnosis:   Patient Active Problem List   Diagnosis Date Noted  . Bipolar disorder with psychotic features (HCC) [F31.9] 06/16/2017  . Depression [F32.9] 06/15/2017  . Other schizophrenia (HCC) [F20.89]   . Pneumonia [J18.9] 06/11/2017  . Community acquired pneumonia of right lower lobe of lung (HCC) [J18.1]   . Hyperglycemia [R73.9]   . Localized edema [R60.0]   . Shortness of breath [R06.02] 06/10/2017   Total Time spent with patient: 15 minutes  Past Psychiatric History:  Schizophrenia.  Past Medical History:  Past Medical History:  Diagnosis Date  . Anxiety   . Arthritis    "back" (06/12/2017)  . Bipolar disorder (HCC)   . CAP (community acquired pneumonia) 06/10/2017  . Chronic lower back pain   . Degenerative disc disease, lumbar   . Depression   . Fibromyalgia   . GERD (gastroesophageal reflux disease)   . High cholesterol   . Hypertension   . Myocardial infarction (HCC) 2002  . Pneumonia 1989  . Schizophrenia (HCC)   . Type II diabetes mellitus (HCC)     Past Surgical History:  Procedure Laterality Date  . CARDIAC CATHETERIZATION  12/2006   [name] 01/18/2011  . INGUINAL HERNIA REPAIR Right   . PARATHYROIDECTOMY    . PERICARDIAL TAP  2000   ?; in [place]/notes 01/18/2011   Family History: History reviewed. No pertinent family history.  Family Psychiatric  History: See H&P  Social History:  History  Alcohol Use  . Yes    Comment: 06/12/2017 "stopped ~ 1 yr ago"      History  Drug Use  . Types: Cocaine    Comment: 06/12/2017 "stopped using cocaine ~ 1 yr ago; used marijuana when I was younger" last cocaine use 9 mths ago per pt on 06/15/17    Social History   Social History  . Marital status: Divorced    Spouse name: N/A  . Number of children:  N/A  . Years of education: N/A   Social History Main Topics  . Smoking status: Current Every Day Smoker    Packs/day: 0.50    Years: 38.00    Types: Cigarettes  . Smokeless tobacco: Current User    Types: Chew  . Alcohol use Yes     Comment: 06/12/2017 "stopped ~ 1 yr ago"   . Drug use: Yes    Types: Cocaine     Comment: 06/12/2017 "stopped using cocaine ~ 1 yr ago; used marijuana when I was younger" last cocaine use 9 mths ago per pt on 06/15/17  . Sexual activity: Not Currently   Other Topics Concern  . None   Social History Narrative  . None   Additional Social History:   Sleep: improving , states he slept better last night   Appetite:  fair- but states improved  compared to admission  Current Medications: Current Facility-Administered Medications  Medication Dose Route Frequency Provider Last Rate Last Dose  . acetaminophen (TYLENOL) tablet 650 mg  650 mg Oral Q6H PRN Nira Conn A, NP   650 mg at 06/22/17 0631  . aspirin EC tablet 81 mg  81 mg Oral Daily Okonkwo, Justina A, NP   81 mg at 06/22/17 0758  . atorvastatin (LIPITOR) tablet 40 mg  40 mg Oral q1800 Okonkwo, Justina A, NP   40 mg at 06/21/17 1634  . gabapentin (NEURONTIN) capsule 300 mg  300 mg Oral BID Okonkwo, Justina A, NP   300 mg at 06/22/17 0758  . hydrochlorothiazide (HYDRODIURIL) tablet 25 mg  25 mg Oral Daily Okonkwo, Justina A, NP   25 mg at 06/22/17 0757  . hydrOXYzine (ATARAX/VISTARIL) tablet 25 mg  25 mg Oral TID PRN Ferne Reus A, NP   25 mg at 06/21/17 2139  . ibuprofen (ADVIL,MOTRIN) tablet 800 mg  800 mg Oral TID Armandina Stammer I, NP   800 mg at 06/22/17 1204  . insulin aspart (novoLOG) injection 0-15 Units  0-15 Units Subcutaneous TID WC Nira Conn A, NP   3 Units at 06/22/17 1204  . insulin aspart (novoLOG) injection 0-5 Units  0-5 Units Subcutaneous QHS Nira Conn A, NP   4 Units at 06/21/17 2140  . insulin aspart protamine- aspart (NOVOLOG MIX 70/30) injection 60 Units  60 Units Subcutaneous Q breakfast Okonkwo, Justina A, NP   60 Units at 06/22/17 0805   And  . insulin aspart protamine- aspart (NOVOLOG MIX 70/30) injection 45 Units  45 Units Subcutaneous Q supper Okonkwo, Justina A, NP   45 Units at 06/21/17 1701  . levofloxacin (LEVAQUIN) tablet 750 mg  750 mg Oral Daily Okonkwo, Justina A, NP   750 mg at 06/22/17 0757  . lidocaine (LIDODERM) 5 % 1 patch  1 patch Transdermal Daily Cobos, Rockey Situ, MD   1 patch at 06/22/17 0758  . metoprolol tartrate (LOPRESSOR) tablet 25 mg  25 mg Oral BID Okonkwo, Justina A, NP   25 mg at 06/22/17 0758  . multivitamin with minerals tablet 1 tablet  1 tablet Oral Daily Okonkwo, Justina A, NP   1 tablet at 06/22/17 0757  .  nicotine (NICODERM CQ - dosed in mg/24 hours) patch 14 mg  14 mg Transdermal Daily Cobos, Rockey Situ, MD   14 mg at 06/22/17 0808  . risperiDONE (RISPERDAL) tablet 1 mg  1 mg Oral BID Cobos, Rockey Situ, MD   1 mg at 06/22/17 0758  . sertraline (ZOLOFT) tablet 50 mg  50 mg  Oral Daily Cobos, Rockey Situ, MD   50 mg at 06/22/17 0757  . traZODone (DESYREL) tablet 100 mg  100 mg Oral QHS PRN Cobos, Rockey Situ, MD   100 mg at 06/21/17 2139   Lab Results:  Results for orders placed or performed during the hospital encounter of 06/16/17 (from the past 48 hour(s))  Glucose, capillary     Status: Abnormal   Collection Time: 06/20/17  4:58 PM  Result Value Ref Range   Glucose-Capillary 311 (H) 65 - 99 mg/dL   Comment 1 Notify RN    Comment 2 Document in Chart   Glucose, capillary     Status: Abnormal   Collection Time: 06/20/17  8:14 PM  Result Value Ref Range   Glucose-Capillary 335 (H) 65 - 99 mg/dL  Glucose, capillary     Status: Abnormal   Collection Time: 06/21/17  6:23 AM  Result Value Ref Range   Glucose-Capillary 165 (H) 65 - 99 mg/dL  Glucose, capillary     Status: Abnormal   Collection Time: 06/21/17 11:49 AM  Result Value Ref Range   Glucose-Capillary 218 (H) 65 - 99 mg/dL  Glucose, capillary     Status: Abnormal   Collection Time: 06/21/17  4:38 PM  Result Value Ref Range   Glucose-Capillary 394 (H) 65 - 99 mg/dL  Glucose, capillary     Status: Abnormal   Collection Time: 06/21/17  8:54 PM  Result Value Ref Range   Glucose-Capillary 348 (H) 65 - 99 mg/dL  Glucose, capillary     Status: Abnormal   Collection Time: 06/22/17  5:52 AM  Result Value Ref Range   Glucose-Capillary 164 (H) 65 - 99 mg/dL   Comment 1 Notify RN    Comment 2 Document in Chart   Glucose, capillary     Status: Abnormal   Collection Time: 06/22/17 11:50 AM  Result Value Ref Range   Glucose-Capillary 196 (H) 65 - 99 mg/dL   Comment 1 Notify RN    Comment 2 Document in Chart    Blood Alcohol level:  Lab  Results  Component Value Date   ETH <10 06/15/2017   ETH <10 06/14/2017   Metabolic Disorder Labs: Lab Results  Component Value Date   HGBA1C 9.5 (H) 06/10/2017   MPG 225.95 06/10/2017   No results found for: PROLACTIN Lab Results  Component Value Date   CHOL 85 06/10/2017   TRIG 101 06/10/2017   HDL 22 (L) 06/10/2017   CHOLHDL 3.9 06/10/2017   VLDL 20 06/10/2017   LDLCALC 43 06/10/2017   Physical Findings: AIMS: Facial and Oral Movements Muscles of Facial Expression: None, normal Lips and Perioral Area: None, normal Jaw: None, normal Tongue: None, normal,Extremity Movements Upper (arms, wrists, hands, fingers): None, normal Lower (legs, knees, ankles, toes): None, normal, Trunk Movements Neck, shoulders, hips: None, normal, Overall Severity Severity of abnormal movements (highest score from questions above): None, normal Incapacitation due to abnormal movements: None, normal Patient's awareness of abnormal movements (rate only patient's report): No Awareness, Dental Status Current problems with teeth and/or dentures?: No Does patient usually wear dentures?: No  CIWA:  CIWA-Ar Total: 1 COWS:  COWS Total Score: 2  Musculoskeletal: Strength & Muscle Tone: within normal limits Gait & Station: normal Patient leans: N/A  Psychiatric Specialty Exam: Physical Exam  Nursing note and vitals reviewed. Constitutional: He appears well-developed.  HENT:  Head: Normocephalic.  Neurological: He is alert.  Psychiatric: He has a normal mood and affect.  Review of Systems  Psychiatric/Behavioral: Positive for depression, hallucinations, substance abuse (Hx. Opioid use) and suicidal ideas. Negative for memory loss. The patient has insomnia. The patient is not nervous/anxious.     Blood pressure 123/83, pulse 91, temperature 97.6 F (36.4 C), resp. rate 18, height 6\' 2"  (1.88 m), weight 117 kg (258 lb).Body mass index is 33.13 kg/m.  General Appearance: Fairly Groomed  Eye  Contact:  Good  Speech:  Normal Rate  Volume:  Normal  Mood:  Depressed,   Affect:  less constricted   Thought Process:  Linear and Descriptions of Associations: Intact  Orientation:  Other:  fully alert and attentive  Thought Content:  reports ongoing hallucinations, auditory  Suicidal Thoughts:  Yes.  without intent/plan - fleeting thoughts.  Homicidal Thoughts:  No  Memory:  Recent and remote grossly intact  Judgement:  Fair- improving   Insight:  Present  Psychomotor Activity:  Normal  Concentration:  Concentration: Good and Attention Span: Good  Recall:  Good  Fund of Knowledge:  Good  Language:  Good  Akathisia:  No  Handed:  Right  AIMS (if indicated):     Assets:  Communication Skills Desire for Improvement Financial Resources/Insurance Resilience Social Support  ADL's:  Intact  Cognition:  WNL  Sleep:  Number of Hours: 6.5   Assessment - Patient is presenting with psychotic symptoms as per his own report and RN report. Affect is less constricted and hallucinations are ongoing. Tolerating medications well .  Treatment Plan Summary: Treatment plan reviewed as below today 06/22/17  Daily contact with patient to assess and evaluate symptoms and progress in treatment and Medication management Encourage ongoing group and milieu participation to work on coping skills and symptom reduction -Continue  Neurontin 300 mg BID for pain and anxiety -Continue Risperdal 1 mg BID for psychosis and mood  -Continue Zoloft  50 mgrs QDAY for depression  -Continue Trazodone 100 mg PRN for insomnia as needed. -ContinueIbuprofen 800 mg tid x 3 days for back pain. Treatment team working on disposition planning options  Sanjuana KavaNwoko, Prakriti Carignan I, NP, PMHNP, FNP-BC. 06/22/2017, 3:44 PM  Patient ID: Arlis PortaKarl J Klumpp, male   DOB: May 15, 1961, 56 y.o.   MRN: [id number]

## 2017-06-22 NOTE — [facility] Group Notes (Signed)
LCSW Group Therapy Note   06/22/2017 1:15pm   Type of Therapy and Topic:  Group Therapy:  Positive Affirmations   Participation Level:  Active  Description of Group: This group addressed positive affirmation toward self and others. Patients went around the room and identified two positive things about themselves and two positive things about a peer in the room. Patients reflected on how it felt to share something positive with others, to identify positive things about themselves, and to hear positive things from others. Patients were encouraged to have a daily reflection of positive characteristics or circumstances.  Therapeutic Goals 1. Patient will verbalize two of their positive qualities 2. Patient will demonstrate empathy for others by stating two positive qualities about a peer in the group 3. Patient will verbalize their feelings when voicing positive self affirmations and when voicing positive affirmations of others 4. Patients will discuss the potential positive impact on their wellness/recovery of focusing on positive traits of self and others. Summary of Patient Progress:   Charles HawthornKarl attended group and remained there the entire time.  He shared that courage for him is not hiding and facing things head on.  He says that it takes courage for him not to do drugs and alcohol anymore and he knows that his struggle are bigger than just himself.  He states that being here with other people who understand and have been through the same things has helped him.  He also states that he is getting tired of being alone and wants to share his life with other people.  He appears to be slightly depressed and lonely.  He shared openly during group. He chose a picture of a duck that was flying and shared that the picture makes him think of courage because it is time to move on.  He states that he needs courage today because he believes it is time for a change and that he wants to move to a new location.    Therapeutic Modalities Cognitive Behavioral Therapy Motivational Interviewing  Charles Orr, Student-Social Work 06/22/2017 1:21 PM

## 2017-06-23 DIAGNOSIS — M2550 Pain in unspecified joint: Secondary | ICD-10-CM

## 2017-06-23 LAB — GLUCOSE, CAPILLARY
GLUCOSE-CAPILLARY: 209 mg/dL — AB (ref 65–99)
GLUCOSE-CAPILLARY: 319 mg/dL — AB (ref 65–99)
Glucose-Capillary: 218 mg/dL — ABNORMAL HIGH (ref 65–99)
Glucose-Capillary: 313 mg/dL — ABNORMAL HIGH (ref 65–99)

## 2017-06-23 NOTE — Progress Notes (Signed)
Patient ID: Charles Orr, male   DOB: 1961-05-06, 56 y.o.   MRN: [id number]  D: Patient observed watching TV and interacting well with peers on approach. Pt reports he is doing well. Pt endorses AH without command. Pt states he always hears voices but voices has minimized. Pt attended evening wrap up group and engaged in discussions.  Denies  SI/HI.No behavioral issues noted.  A: Support and encouragement offered as needed. Medications administered as prescribed.  R: Patient is safe and cooperative on unit. Will continue to monitor  for safety and stability.

## 2017-06-23 NOTE — Progress Notes (Signed)
DAR NOTE: Patient presents with flat affect and depressed mood.  Reports suicidal thoughts, pain, and auditory hallucination but contracts for safety.  Rates depression at 6, hopelessness at 6, and anxiety at 5.  Maintained on routine safety checks.  Medications given as prescribed.  Support and encouragement offered as needed.  Attended group and participated.  States goal for today is "getting better mentally."  Patient visible in the dayroom with minimal interactions with staff and peers.

## 2017-06-23 NOTE — [facility] Group Notes (Signed)
  [facility] LCSW Group Therapy Note  Date/Time:  06/23/2017 11:15AM-12:00PM  Type of Therapy and Topic:  Group Therapy:  Feelings About Hospitalization  Participation Level:  Active   Description of Group This process group involved patients discussing their feelings related to being hospitalized, as well as the benefits they see to being in the hospital.  These feelings and benefits were itemized.  The group then brainstormed specific ways in which they could seek those same benefits when they discharge and return home.  Therapeutic Goals 1. Patient will identify and describe positive and negative feelings related to hospitalization 2. Patient will verbalize benefits of hospitalization to themselves personally 3. Patients will brainstorm together ways they can obtain similar benefits in the outpatient setting, identify barriers to wellness and possible solutions  Summary of Patient Progress:  The patient expressed his primary feelings about being hospitalized are "glad to be here" and "being heard."  He participated well.  Therapeutic Modalities Cognitive Behavioral Therapy Motivational Interviewing    Ambrose MantleMareida Grossman-Orr, LCSW 06/23/2017, 8:59 AM

## 2017-06-23 NOTE — Progress Notes (Signed)
[facility] MD Progress Note  06/23/2017 12:34 PM NYKOLAS BACALLAO  MRN:  [id number] Subjective:  I still hear voices and some time having suicidal thoughts but I'm getting better. Objective; patient seen chart reviewed.  Patient is taking his medication and denies any side effects.  He is going to the groups but he has limited participation.  He still have mood swings, highs and lows, hallucination and suicidal thoughts.  His sleep is slowly and gradually getting better.  However last night he could not get sleep because of the roommate.  He has no tremors or shakes.  He has chronic back pain.  He is not disruptive in the unit.  He lives by himself.  Principal Problem: Bipolar disorder with psychotic features [facility]) Diagnosis:   Patient Active Problem List   Diagnosis Date Noted  . Bipolar disorder with psychotic features (HCC) [F31.9] 06/16/2017  . Depression [F32.9] 06/15/2017  . Other schizophrenia (HCC) [F20.89]   . Pneumonia [J18.9] 06/11/2017  . Community acquired pneumonia of right lower lobe of lung (HCC) [J18.1]   . Hyperglycemia [R73.9]   . Localized edema [R60.0]   . Shortness of breath [R06.02] 06/10/2017   Total Time spent with patient: 20 minutes   Past Psychiatric History: Reviewed.  Past Medical History:  Past Medical History:  Diagnosis Date  . Anxiety   . Arthritis    "back" (06/12/2017)  . Bipolar disorder (HCC)   . CAP (community acquired pneumonia) 06/10/2017  . Chronic lower back pain   . Degenerative disc disease, lumbar   . Depression   . Fibromyalgia   . GERD (gastroesophageal reflux disease)   . High cholesterol   . Hypertension   . Myocardial infarction (HCC) 2002  . Pneumonia 1989  . Schizophrenia (HCC)   . Type II diabetes mellitus (HCC)     Past Surgical History:  Procedure Laterality Date  . CARDIAC CATHETERIZATION  12/2006   [name] 01/18/2011  . INGUINAL HERNIA REPAIR Right   . PARATHYROIDECTOMY    . PERICARDIAL TAP  2000   ?; in [place]/notes  01/18/2011   Family History: History reviewed. No pertinent family history. Family Psychiatric  History: Reviewed. Social History:  History  Alcohol Use  . Yes    Comment: 06/12/2017 "stopped ~ 1 yr ago"      History  Drug Use  . Types: Cocaine    Comment: 06/12/2017 "stopped using cocaine ~ 1 yr ago; used marijuana when I was younger" last cocaine use 9 mths ago per pt on 06/15/17    Social History   Social History  . Marital status: Divorced    Spouse name: N/A  . Number of children: N/A  . Years of education: N/A   Social History Main Topics  . Smoking status: Current Every Day Smoker    Packs/day: 0.50    Years: 38.00    Types: Cigarettes  . Smokeless tobacco: Current User    Types: Chew  . Alcohol use Yes     Comment: 06/12/2017 "stopped ~ 1 yr ago"   . Drug use: Yes    Types: Cocaine     Comment: 06/12/2017 "stopped using cocaine ~ 1 yr ago; used marijuana when I was younger" last cocaine use 9 mths ago per pt on 06/15/17  . Sexual activity: Not Currently   Other Topics Concern  . None   Social History Narrative  . None   Additional Social History:  Sleep: Fair  Appetite:  Good  Current Medications: Current Facility-Administered Medications  Medication Dose Route Frequency Provider Last Rate Last Dose  . acetaminophen (TYLENOL) tablet 650 mg  650 mg Oral Q6H PRN Nira Conn A, NP   650 mg at 06/22/17 0631  . aspirin EC tablet 81 mg  81 mg Oral Daily Okonkwo, Justina A, NP   81 mg at 06/23/17 0844  . atorvastatin (LIPITOR) tablet 40 mg  40 mg Oral q1800 Okonkwo, Justina A, NP   40 mg at 06/22/17 1701  . gabapentin (NEURONTIN) capsule 300 mg  300 mg Oral BID Okonkwo, Justina A, NP   300 mg at 06/23/17 0847  . hydrochlorothiazide (HYDRODIURIL) tablet 25 mg  25 mg Oral Daily Okonkwo, Justina A, NP   25 mg at 06/23/17 0846  . hydrOXYzine (ATARAX/VISTARIL) tablet 25 mg  25 mg Oral TID PRN Ferne Reus A, NP   25 mg at 06/22/17  2114  . ibuprofen (ADVIL,MOTRIN) tablet 800 mg  800 mg Oral TID Armandina Stammer I, NP   800 mg at 06/23/17 1202  . insulin aspart (novoLOG) injection 0-15 Units  0-15 Units Subcutaneous TID WC Nira Conn A, NP   5 Units at 06/23/17 1200  . insulin aspart (novoLOG) injection 0-5 Units  0-5 Units Subcutaneous QHS Nira Conn A, NP   3 Units at 06/22/17 2115  . insulin aspart protamine- aspart (NOVOLOG MIX 70/30) injection 60 Units  60 Units Subcutaneous Q breakfast Okonkwo, Justina A, NP   60 Units at 06/23/17 0843   And  . insulin aspart protamine- aspart (NOVOLOG MIX 70/30) injection 45 Units  45 Units Subcutaneous Q supper Okonkwo, Justina A, NP   45 Units at 06/22/17 1656  . levofloxacin (LEVAQUIN) tablet 750 mg  750 mg Oral Daily Okonkwo, Justina A, NP   750 mg at 06/23/17 0846  . lidocaine (LIDODERM) 5 % 1 patch  1 patch Transdermal Daily Cobos, Rockey Situ, MD   1 patch at 06/23/17 0844  . metoprolol tartrate (LOPRESSOR) tablet 25 mg  25 mg Oral BID Okonkwo, Justina A, NP   25 mg at 06/23/17 0846  . multivitamin with minerals tablet 1 tablet  1 tablet Oral Daily Okonkwo, Justina A, NP   1 tablet at 06/23/17 0846  . nicotine (NICODERM CQ - dosed in mg/24 hours) patch 14 mg  14 mg Transdermal Daily Cobos, Rockey Situ, MD   14 mg at 06/23/17 0846  . risperiDONE (RISPERDAL) tablet 1 mg  1 mg Oral BID Cobos, Rockey Situ, MD   1 mg at 06/23/17 0846  . sertraline (ZOLOFT) tablet 50 mg  50 mg Oral Daily Cobos, Rockey Situ, MD   50 mg at 06/23/17 0847  . traZODone (DESYREL) tablet 100 mg  100 mg Oral QHS PRN Cobos, Rockey Situ, MD   100 mg at 06/22/17 2114    Lab Results:  Results for orders placed or performed during the hospital encounter of 06/16/17 (from the past 48 hour(s))  Glucose, capillary     Status: Abnormal   Collection Time: 06/21/17  4:38 PM  Result Value Ref Range   Glucose-Capillary 394 (H) 65 - 99 mg/dL  Glucose, capillary     Status: Abnormal   Collection Time: 06/21/17  8:54 PM   Result Value Ref Range   Glucose-Capillary 348 (H) 65 - 99 mg/dL  Glucose, capillary     Status: Abnormal   Collection Time: 06/22/17  5:52 AM  Result Value Ref Range  Glucose-Capillary 164 (H) 65 - 99 mg/dL   Comment 1 Notify RN    Comment 2 Document in Chart   Glucose, capillary     Status: Abnormal   Collection Time: 06/22/17 11:50 AM  Result Value Ref Range   Glucose-Capillary 196 (H) 65 - 99 mg/dL   Comment 1 Notify RN    Comment 2 Document in Chart   Glucose, capillary     Status: Abnormal   Collection Time: 06/22/17  4:50 PM  Result Value Ref Range   Glucose-Capillary 260 (H) 65 - 99 mg/dL  Glucose, capillary     Status: Abnormal   Collection Time: 06/22/17  8:32 PM  Result Value Ref Range   Glucose-Capillary 269 (H) 65 - 99 mg/dL  Glucose, capillary     Status: Abnormal   Collection Time: 06/23/17  6:17 AM  Result Value Ref Range   Glucose-Capillary 209 (H) 65 - 99 mg/dL  Glucose, capillary     Status: Abnormal   Collection Time: 06/23/17 11:57 AM  Result Value Ref Range   Glucose-Capillary 218 (H) 65 - 99 mg/dL    Blood Alcohol level:  Lab Results  Component Value Date   ETH <10 06/15/2017   ETH <10 06/14/2017    Metabolic Disorder Labs: Lab Results  Component Value Date   HGBA1C 9.5 (H) 06/10/2017   MPG 225.95 06/10/2017   No results found for: PROLACTIN Lab Results  Component Value Date   CHOL 85 06/10/2017   TRIG 101 06/10/2017   HDL 22 (L) 06/10/2017   CHOLHDL 3.9 06/10/2017   VLDL 20 06/10/2017   LDLCALC 43 06/10/2017    Physical Findings: AIMS: Facial and Oral Movements Muscles of Facial Expression: None, normal Lips and Perioral Area: None, normal Jaw: None, normal Tongue: None, normal,Extremity Movements Upper (arms, wrists, hands, fingers): None, normal Lower (legs, knees, ankles, toes): None, normal, Trunk Movements Neck, shoulders, hips: None, normal, Overall Severity Severity of abnormal movements (highest score from questions  above): None, normal Incapacitation due to abnormal movements: None, normal Patient's awareness of abnormal movements (rate only patient's report): No Awareness, Dental Status Current problems with teeth and/or dentures?: No Does patient usually wear dentures?: No  CIWA:  CIWA-Ar Total: 1 COWS:  COWS Total Score: 2  Musculoskeletal: Strength & Muscle Tone: within normal limits Gait & Station: normal Patient leans: N/A  Psychiatric Specialty Exam: Physical Exam  Review of Systems  HENT: Negative.   Respiratory: Negative.   Cardiovascular: Negative.   Gastrointestinal: Negative.   Genitourinary: Negative.   Musculoskeletal: Positive for back pain and joint pain.  Skin: Negative.   Neurological: Negative.   Psychiatric/Behavioral: Positive for depression, hallucinations and suicidal ideas.    Blood pressure 124/79, pulse 95, temperature 97.6 F (36.4 C), resp. rate 20, height 6\' 2"  (1.88 m), weight 117 kg (258 lb).Body mass index is 33.13 kg/m.  General Appearance: Casual and Fairly Groomed  Eye Contact:  Fair  Speech:  Slow  Volume:  Normal  Mood:  Depressed and Dysphoric  Affect:  Constricted  Thought Process:  Descriptions of Associations: Intact  Orientation:  Full (Time, Place, and Person)  Thought Content:  Hallucinations: Auditory, Paranoid Ideation and Rumination  Suicidal Thoughts:  Yes.  without intent/plan  Homicidal Thoughts:  No  Memory:  Immediate;   Fair Recent;   Fair Remote;   Fair  Judgement:  Good  Insight:  Good  Psychomotor Activity:  Decreased  Concentration:  Concentration: Fair and Attention Span: Fair  Recall:  Fair  Fund of Knowledge:  Good  Language:  Good  Akathisia:  No  Handed:  Right  AIMS (if indicated):     Assets:  Communication Skills Desire for Improvement Housing  ADL's:  Intact  Cognition:  WNL  Sleep:  Number of Hours: 6.5     Treatment Plan Summary: Daily contact with patient to assess and evaluate symptoms and  progress in treatment  Patient is making improvement from the past.  I review blood work results.  Continue Neurontin 300 mg twice a day, Risperdal 1 mg twice a day, Zoloft 50 mg daily and trazodone 50 mg May repeat one more time if needed for insomnia.  Encourage to participate in group milieu therapy.  Social worker to start discharge this planning.  Dorice Stiggers T., MD 06/23/2017, 12:34 PM

## 2017-06-24 LAB — GLUCOSE, CAPILLARY
GLUCOSE-CAPILLARY: 163 mg/dL — AB (ref 65–99)
GLUCOSE-CAPILLARY: 197 mg/dL — AB (ref 65–99)
Glucose-Capillary: 319 mg/dL — ABNORMAL HIGH (ref 65–99)
Glucose-Capillary: 277 mg/dL — ABNORMAL HIGH (ref 65–99)

## 2017-06-24 NOTE — Progress Notes (Signed)
D: Patient in his room sleeping most evening. Only came out during snack time. Got bedtime med and went back to his room. Reports lower back pain 9/10 (pointing in his left buttocks). Accepted PRN of acetaminophen offered. Denies SI/HI, AH/VH at this time. Endorses mild depression. Appear cheerful and pleasant on unit. No behavior issues noted.  A: Staff offered support and encouragement as needed. Routine safety checks maintained. Will continue to monitor patient. R: Patient remains safe on unit.

## 2017-06-24 NOTE — Progress Notes (Signed)
Psychoeducational Group Note  Date:  06/24/2017 Time:  2127  Group Topic/Focus:  Wrap-Up Group:   The focus of this group is to help patients review their daily goal of treatment and discuss progress on daily workbooks.  Participation Level: Did Not Attend  Participation Quality:  Not Applicable  Affect:  Not Applicable  Cognitive:  Not Applicable  Insight:  Not Applicable  Engagement in Group: Not Applicable  Additional Comments:  The patient did not attend group.   Hazle CocaGOODMAN, Jetson Pickrel S 06/24/2017, 9:27 PM

## 2017-06-24 NOTE — Progress Notes (Signed)
[facility] Progress Note  06/24/2017 12:01 PM Charles Orr  MRN:  [id number] Subjective:  I'm still hearing voices.  I still have suicidal thoughts.  I don't feel ready to be discharged today. Objective; patient seen chart reviewed.  Patient continued to endorse hallucination, suicidal thoughts and depression.  He continues to have mood swings and does not feel he is ready for discharge today.  He has no tremors or shakes.  He admitted sleep on and off but he is not disruptive and going to the groups.  He has no other concerns.  He lives by himself.  Principal Problem: Bipolar disorder with psychotic features [facility](HCC) Diagnosis:   Patient Active Problem List   Diagnosis Date Noted  . Bipolar disorder with psychotic features (HCC) [F31.9] 06/16/2017  . Depression [F32.9] 06/15/2017  . Other schizophrenia (HCC) [F20.89]   . Pneumonia [J18.9] 06/11/2017  . Community acquired pneumonia of right lower lobe of lung (HCC) [J18.1]   . Hyperglycemia [R73.9]   . Localized edema [R60.0]   . Shortness of breath [R06.02] 06/10/2017   Total Time spent with patient: 20 minutes  Past Psychiatric History: Reviewed.  Past Medical History:  Past Medical History:  Diagnosis Date  . Anxiety   . Arthritis    "back" (06/12/2017)  . Bipolar disorder (HCC)   . CAP (community acquired pneumonia) 06/10/2017  . Chronic lower back pain   . Degenerative disc disease, lumbar   . Depression   . Fibromyalgia   . GERD (gastroesophageal reflux disease)   . High cholesterol   . Hypertension   . Myocardial infarction (HCC) 2002  . Pneumonia 1989  . Schizophrenia (HCC)   . Type II diabetes mellitus (HCC)     Past Surgical History:  Procedure Laterality Date  . CARDIAC CATHETERIZATION  12/2006   [name]/notes 01/18/2011  . INGUINAL HERNIA REPAIR Right   . PARATHYROIDECTOMY    . PERICARDIAL TAP  2000   ?; in [place]/notes 01/18/2011   Family History: History reviewed. No pertinent family history. Family Psychiatric   History: Reviewed. Social History:  History  Alcohol Use  . Yes    Comment: 06/12/2017 "stopped ~ 1 yr ago"      History  Drug Use  . Types: Cocaine    Comment: 06/12/2017 "stopped using cocaine ~ 1 yr ago; used marijuana when I was younger" last cocaine use 9 mths ago per pt on 06/15/17    Social History   Social History  . Marital status: Divorced    Spouse name: N/A  . Number of children: N/A  . Years of education: N/A   Social History Main Topics  . Smoking status: Current Every Day Smoker    Packs/day: 0.50    Years: 38.00    Types: Cigarettes  . Smokeless tobacco: Current User    Types: Chew  . Alcohol use Yes     Comment: 06/12/2017 "stopped ~ 1 yr ago"   . Drug use: Yes    Types: Cocaine     Comment: 06/12/2017 "stopped using cocaine ~ 1 yr ago; used marijuana when I was younger" last cocaine use 9 mths ago per pt on 06/15/17  . Sexual activity: Not Currently   Other Topics Concern  . None   Social History Narrative  . None   Additional Social History:                         Sleep: Fair  Appetite:  Good  Current Medications: Current Facility-Administered Medications  Medication Dose Route Frequency Provider Last Rate Last Dose  . acetaminophen (TYLENOL) tablet 650 mg  650 mg Oral Q6H PRN Nira Conn A, NP   650 mg at 06/23/17 2115  . aspirin EC tablet 81 mg  81 mg Oral Daily Okonkwo, Justina A, NP   81 mg at 06/24/17 0802  . atorvastatin (LIPITOR) tablet 40 mg  40 mg Oral q1800 Okonkwo, Justina A, NP   40 mg at 06/23/17 1616  . gabapentin (NEURONTIN) capsule 300 mg  300 mg Oral BID Okonkwo, Justina A, NP   300 mg at 06/24/17 0801  . hydrochlorothiazide (HYDRODIURIL) tablet 25 mg  25 mg Oral Daily Okonkwo, Justina A, NP   25 mg at 06/24/17 0801  . hydrOXYzine (ATARAX/VISTARIL) tablet 25 mg  25 mg Oral TID PRN Beryle Lathe, Justina A, NP   25 mg at 06/23/17 2114  . ibuprofen (ADVIL,MOTRIN) tablet 800 mg  800 mg Oral TID Armandina Stammer I, NP   800 mg at  06/24/17 0801  . insulin aspart (novoLOG) injection 0-15 Units  0-15 Units Subcutaneous TID WC Jackelyn Poling, NP   3 Units at 06/24/17 0617  . insulin aspart (novoLOG) injection 0-5 Units  0-5 Units Subcutaneous QHS Nira Conn A, NP   4 Units at 06/23/17 2118  . insulin aspart protamine- aspart (NOVOLOG MIX 70/30) injection 60 Units  60 Units Subcutaneous Q breakfast Okonkwo, Justina A, NP   60 Units at 06/24/17 0803   And  . insulin aspart protamine- aspart (NOVOLOG MIX 70/30) injection 45 Units  45 Units Subcutaneous Q supper Okonkwo, Justina A, NP   45 Units at 06/23/17 1708  . levofloxacin (LEVAQUIN) tablet 750 mg  750 mg Oral Daily Okonkwo, Justina A, NP   750 mg at 06/24/17 0801  . lidocaine (LIDODERM) 5 % 1 patch  1 patch Transdermal Daily Cobos, Rockey Situ, MD   1 patch at 06/23/17 0844  . metoprolol tartrate (LOPRESSOR) tablet 25 mg  25 mg Oral BID Okonkwo, Justina A, NP   25 mg at 06/24/17 0801  . multivitamin with minerals tablet 1 tablet  1 tablet Oral Daily Okonkwo, Justina A, NP   1 tablet at 06/24/17 0801  . nicotine (NICODERM CQ - dosed in mg/24 hours) patch 14 mg  14 mg Transdermal Daily Cobos, Rockey Situ, MD   14 mg at 06/24/17 0803  . risperiDONE (RISPERDAL) tablet 1 mg  1 mg Oral BID Cobos, Rockey Situ, MD   1 mg at 06/24/17 0802  . sertraline (ZOLOFT) tablet 50 mg  50 mg Oral Daily Cobos, Rockey Situ, MD   50 mg at 06/24/17 0802  . traZODone (DESYREL) tablet 100 mg  100 mg Oral QHS PRN Cobos, Rockey Situ, MD   100 mg at 06/23/17 2113    Lab Results:  Results for orders placed or performed during the hospital encounter of 06/16/17 (from the past 48 hour(s))  Glucose, capillary     Status: Abnormal   Collection Time: 06/22/17  4:50 PM  Result Value Ref Range   Glucose-Capillary 260 (H) 65 - 99 mg/dL  Glucose, capillary     Status: Abnormal   Collection Time: 06/22/17  8:32 PM  Result Value Ref Range   Glucose-Capillary 269 (H) 65 - 99 mg/dL  Glucose, capillary     Status:  Abnormal   Collection Time: 06/23/17  6:17 AM  Result Value Ref Range   Glucose-Capillary 209 (H) 65 - 99 mg/dL  Glucose, capillary     Status: Abnormal   Collection Time: 06/23/17 11:57 AM  Result Value Ref Range   Glucose-Capillary 218 (H) 65 - 99 mg/dL  Glucose, capillary     Status: Abnormal   Collection Time: 06/23/17  4:23 PM  Result Value Ref Range   Glucose-Capillary 319 (H) 65 - 99 mg/dL  Glucose, capillary     Status: Abnormal   Collection Time: 06/23/17  8:33 PM  Result Value Ref Range   Glucose-Capillary 313 (H) 65 - 99 mg/dL  Glucose, capillary     Status: Abnormal   Collection Time: 06/24/17  6:13 AM  Result Value Ref Range   Glucose-Capillary 163 (H) 65 - 99 mg/dL  Glucose, capillary     Status: Abnormal   Collection Time: 06/24/17 11:55 AM  Result Value Ref Range   Glucose-Capillary 197 (H) 65 - 99 mg/dL    Blood Alcohol level:  Lab Results  Component Value Date   ETH <10 06/15/2017   ETH <10 06/14/2017    Metabolic Disorder Labs: Lab Results  Component Value Date   HGBA1C 9.5 (H) 06/10/2017   MPG 225.95 06/10/2017   No results found for: PROLACTIN Lab Results  Component Value Date   CHOL 85 06/10/2017   TRIG 101 06/10/2017   HDL 22 (L) 06/10/2017   CHOLHDL 3.9 06/10/2017   VLDL 20 06/10/2017   LDLCALC 43 06/10/2017    Physical Findings: AIMS: Facial and Oral Movements Muscles of Facial Expression: None, normal Lips and Perioral Area: None, normal Jaw: None, normal Tongue: None, normal,Extremity Movements Upper (arms, wrists, hands, fingers): None, normal Lower (legs, knees, ankles, toes): None, normal, Trunk Movements Neck, shoulders, hips: None, normal, Overall Severity Severity of abnormal movements (highest score from questions above): None, normal Incapacitation due to abnormal movements: None, normal Patient's awareness of abnormal movements (rate only patient's report): No Awareness, Dental Status Current problems with teeth and/or  dentures?: No Does patient usually wear dentures?: No  CIWA:  CIWA-Ar Total: 1 COWS:  COWS Total Score: 2  Musculoskeletal: Strength & Muscle Tone: within normal limits Gait & Station: normal Patient leans: N/A  Psychiatric Specialty Exam: Physical Exam  Review of Systems  Constitutional: Negative.   HENT: Negative.   Musculoskeletal: Positive for back pain and joint pain.  Skin: Negative.   Psychiatric/Behavioral: Positive for depression, hallucinations and suicidal ideas.    Blood pressure 117/80, pulse 96, temperature 97.7 F (36.5 C), temperature source Oral, resp. rate 18, height 6\' 2"  (1.88 m), weight 117 kg (258 lb).Body mass index is 33.13 kg/m.  General Appearance: Casual  Eye Contact:  Good  Speech:  Clear and Coherent  Volume:  Normal  Mood:  Anxious and Depressed  Affect:  Constricted  Thought Process:  Goal Directed  Orientation:  Full (Time, Place, and Person)  Thought Content:  Hallucinations: Auditory and Paranoid Ideation  Suicidal Thoughts:  Yes.  without intent/plan  Homicidal Thoughts:  No  Memory:  Immediate;   Good Recent;   Good Remote;   Good  Judgement:  Good  Insight:  Good  Psychomotor Activity:  Normal  Concentration:  Concentration: Fair and Attention Span: Fair  Recall:  Good  Fund of Knowledge:  Good  Language:  Good  Akathisia:  No  Handed:  Right  AIMS (if indicated):     Assets:  Communication Skills Desire for Improvement Housing  ADL's:  Intact  Cognition:  WNL  Sleep:  Number of Hours: 6.25  Treatment Plan Summary: Daily contact with patient to assess and evaluate symptoms and progress in treatment  Patient continued to exhibit symptoms of depression and continues to have suicidal thoughts and paranoia.  However he is making improvement from the past.  He is going to groups.  Continue Risperdal 1 mg twice a day, Zoloft 50 mg daily, Neurontin 300 mg twice a day and trazodone 50 mg at bedtime.  Encouraged to participate in  group therapy.  Social worker to continue follow on his discharge disposition.  ARFEEN,SYED T., MD 06/24/2017, 12:01 PM

## 2017-06-24 NOTE — [facility] Group Notes (Signed)
[facility]  Date/Time:  06/24/2017  11:00AM-12:00PM  Type of Therapy and Topic:  Group Therapy:  Music and Mood  Participation Level:  Active   Description of Group: In this process group, members listened to a variety of genres of music and identified that different types of music evoke different responses.  Patients were encouraged to identify music that was soothing for them and music that was energizing for them.  Patients discussed how this knowledge can help with wellness and recovery in various ways including managing depression and anxiety as well as encouraging healthy sleep habits.    Therapeutic Goals: 1. Patients will explore the impact of different varieties of music on mood 2. Patients will verbalize the thoughts they have when listening to different types of music 3. Patients will identify music that is soothing to them as well as music that is energizing to them 4. Patients will discuss how to use this knowledge to assist in maintaining wellness and recovery 5. Patients will explore the use of music as a coping skill  Summary of Patient Progress:  At the beginning of group, patient expressed that he was very anxious and rated this at a 9 out of 10.  He said his mind was racing a great deal.  His affect brightened throughout group and at the end he said his anxiety was now a 7.  Therapeutic Modalities: Solution Focused Brief Therapy Motivational Interviewing Activity   Ambrose MantleMareida Grossman-Orr, LCSW 06/24/2017 9:20 AM

## 2017-06-24 NOTE — Progress Notes (Signed)
Patient ID: Arlis PortaKarl J Feher, male   DOB: Jul 13, 1961, 56 y.o.   MRN: [id number]  DAR: Pt. Denies HI and visual Hallucinations. He reports passive SI and auditory hallucinations. He reports that these are intermittent and he is able to contract for safety. He reports that this is not a good day for him emotionally. He reports that his sleep last night was fair, his appetite is fair, his energy level is low, and his concentration is poor. He rates his depression level 8/10, hopelessness level 9/10, and anxiety level 9/10. Patient does continue to report chronic back pain and is receiving scheduled Ibuprofen. He refused his lidocaine patch reporting that it "does not help." Support and encouragement provided to the patient. Scheduled medications administered to patient per physician's orders. Patient is receptive and cooperative. He is seen in the milieu throughout most of the day. His is appropriate with his peers and staff. Q15 minute checks are maintained for safety.

## 2017-06-24 NOTE — Plan of Care (Signed)
Problem: Safety: Goal: Ability to disclose and discuss suicidal ideas will improve Outcome: Progressing Pt SI- contracts for safety  Problem: Medication: Goal: Compliance with prescribed medication regimen will improve Outcome: Progressing Pt takes medications as prescribed

## 2017-06-24 NOTE — Progress Notes (Signed)
D: Pt passive SI/ AH- contracts for safety. Pt is pleasant and cooperative. Pt stated he was doing ok, pt concerned about his D/C.   A: Pt was offered support and encouragement. Pt was given scheduled medications. Pt was encourage to attend groups. Q 15 minute checks were done for safety.   R:Pt attends groups and interacts well with peers and staff. Pt is taking medication. Pt has no complaints.Pt receptive to treatment and safety maintained on unit.

## 2017-06-25 LAB — GLUCOSE, CAPILLARY
Glucose-Capillary: 160 mg/dL — ABNORMAL HIGH (ref 65–99)
Glucose-Capillary: 192 mg/dL — ABNORMAL HIGH (ref 65–99)
Glucose-Capillary: 355 mg/dL — ABNORMAL HIGH (ref 65–99)
Glucose-Capillary: 291 mg/dL — ABNORMAL HIGH (ref 65–99)

## 2017-06-25 MED ORDER — RISPERIDONE 2 MG PO TABS
2.0000 mg | ORAL_TABLET | Freq: Every day | ORAL | Status: DC
  Administered 2017-06-25: 2 mg via ORAL
  Filled 2017-06-25 (×3): qty 1

## 2017-06-25 MED ORDER — IBUPROFEN 600 MG PO TABS
600.0000 mg | ORAL_TABLET | Freq: Four times a day (QID) | ORAL | Status: DC | PRN
  Administered 2017-06-25 – 2017-06-26 (×2): 600 mg via ORAL
  Filled 2017-06-25 (×3): qty 1

## 2017-06-25 MED ORDER — RISPERIDONE 1 MG PO TBDP
1.0000 mg | ORAL_TABLET | Freq: Once | ORAL | Status: AC
  Administered 2017-06-25: 1 mg via ORAL
  Filled 2017-06-25 (×2): qty 1

## 2017-06-25 MED ORDER — RISPERIDONE 1 MG PO TABS
1.0000 mg | ORAL_TABLET | Freq: Every day | ORAL | Status: DC
  Administered 2017-06-26: 1 mg via ORAL
  Filled 2017-06-25 (×3): qty 1
  Filled 2017-06-25: qty 21

## 2017-06-25 NOTE — Progress Notes (Signed)
Adult Psychoeducational Group Note  Date:  06/25/2017 Time:  8:54 PM  Group Topic/Focus:  Wrap-Up Group:   The focus of this group is to help patients review their daily goal of treatment and discuss progress on daily workbooks.  Participation Level:  Active  Participation Quality:  Appropriate  Affect:  Appropriate  Cognitive:  Appropriate  Insight: Appropriate  Engagement in Group:  Engaged  Modes of Intervention:  Discussion  Additional Comments:  The patient expressed that he attend groups.The patient also said that he rates today a 6 and achieved his goal.  [name], [name] 06/25/2017, 8:54 PM

## 2017-06-25 NOTE — Progress Notes (Signed)
Recreation Therapy Notes  Date: 06/25/17 Time: 1000 Location: 500 Hall Dayroom  Group Topic: Coping Skills  Goal Area(s) Addresses:  Patients will be able to identify positive coping skills. Patients will be able to identify the benefits of using coping skills post d/c.  Behavioral Response: Engaged  Intervention: Production designer, theatre/television/filmWeb design worksheet, pencils  Activity: OrthoptistWeb Design.  Patients were given a picture of a blank spider web.  Patients were to identify the things that have gotten them stuck and write them inside the web.  Patients were to then identify at least two coping skills for each situation they identified.  Education: PharmacologistCoping Skills, Building control surveyorDischarge Planning.   Education Outcome: Acknowledges understanding/In group clarification offered/Needs additional education.   Clinical Observations/Feedback: Pt stated he has been dealing with pain and anxiety.  Pt stated his coping skills for these things were "medication, rest, think of good things and listen to music".  Pt also stated "you can feel depressed when you don't use your coping skills".   Pt expressed that using coping skills can "keep you from feeling stuck by giving you things for you to do".   Caroll RancherMarjette Aubra Pappalardo, LRT/CTRS         Caroll RancherLindsay, Syncere Eble A 06/25/2017 12:01 PM

## 2017-06-25 NOTE — Progress Notes (Addendum)
Inpatient Diabetes Program Recommendations  AACE/ADA: New Consensus Statement on Inpatient Glycemic Control (2015)  Target Ranges:  Prepandial:   less than 140 mg/dL      Peak postprandial:   less than 180 mg/dL (1-2 hours)      Critically ill patients:  140 - 180 mg/dL   Results for Charles Orr, Charles Orr (MRN [id number]) as of 06/25/2017 09:01  Ref. Range 06/24/2017 06:13 06/24/2017 11:55 06/24/2017 16:56 06/24/2017 20:35  Glucose-Capillary Latest Ref Range: 65 - 99 mg/dL 782163 (H) 956197 (H) 213319 (H) 277 (H)   Results for Charles Orr, Charles Orr (MRN [id number]) as of 06/25/2017 09:01  Ref. Range 06/25/2017 05:58  Glucose-Capillary Latest Ref Range: 65 - 99 mg/dL 629160 (H)    Home DM Meds: 70/30 Insulin- 65 units AM/ 45 units PM  Current Insulin Orders: 70/30 Insulin- 60 units AM/ 45 units PM      Novolog Moderate Correction Scale/ SSI (0-15 units) TID AC + HS       MD- Please consider the following in-hospital insulin adjustments:  Please increase 70/30 Insulin to:  65 units AM  50 units PM      --Will follow patient during hospitalization--  Ambrose FinlandJeannine Johnston Elainah Rhyne RN, MSN, CDE Diabetes Coordinator Inpatient Glycemic Control Team Team Pager: [phone] (8a-5p)

## 2017-06-25 NOTE — Progress Notes (Signed)
Patient ID: Charles Orr, male   DOB: 21-May-1961, 56 y.o.   MRN: [id number]  [facility] MD Progress Note  06/25/2017 2:22 PM Charles Orr  MRN:  [id number] Subjective:  "The voices got real loud and they're giving me ideas." Objective; patient seen chart reviewed.  Patient continues to endorse auditory hallucinations and states that they have been making violent statements suggesting that the patient is violent towards others. Pt reports that he will be able to maintain his own safety and the safety of others while he is on the unit. He notes that his sleep was disrupted by his roommate on Friday night, but since that time it has been adequate. He denies SI/HI. He endorses VH of "images" but he is not able to describe them further. He reports that mood swings are improved, but his current concern is mostly revolved around symptoms of psychosis. He expresses concern that he has been wanting to stay with his son in [place], but he has not yet been able to make contact with his son and pt feels that his symptoms of AH are too severe at this time to discharge. Pt feels that his medications are helpful and he agrees to increase dose of risperdal. He had no further questions, comments, or concerns.   Principal Problem: Bipolar disorder with psychotic features [facility]) Diagnosis:   Patient Active Problem List   Diagnosis Date Noted  . Bipolar disorder with psychotic features (HCC) [F31.9] 06/16/2017  . Depression [F32.9] 06/15/2017  . Other schizophrenia (HCC) [F20.89]   . Pneumonia [J18.9] 06/11/2017  . Community acquired pneumonia of right lower lobe of lung (HCC) [J18.1]   . Hyperglycemia [R73.9]   . Localized edema [R60.0]   . Shortness of breath [R06.02] 06/10/2017   Total Time spent with patient: 20 minutes  Past Psychiatric History: Reviewed.  Past Medical History:  Past Medical History:  Diagnosis Date  . Anxiety   . Arthritis    "back" (06/12/2017)  . Bipolar disorder (HCC)   . CAP  (community acquired pneumonia) 06/10/2017  . Chronic lower back pain   . Degenerative disc disease, lumbar   . Depression   . Fibromyalgia   . GERD (gastroesophageal reflux disease)   . High cholesterol   . Hypertension   . Myocardial infarction (HCC) 2002  . Pneumonia 1989  . Schizophrenia (HCC)   . Type II diabetes mellitus (HCC)     Past Surgical History:  Procedure Laterality Date  . CARDIAC CATHETERIZATION  12/2006   [name] 01/18/2011  . INGUINAL HERNIA REPAIR Right   . PARATHYROIDECTOMY    . PERICARDIAL TAP  2000   ?; in [place]/notes 01/18/2011   Family History: History reviewed. No pertinent family history. Family Psychiatric  History: Reviewed. Social History:  History  Alcohol Use  . Yes    Comment: 06/12/2017 "stopped ~ 1 yr ago"      History  Drug Use  . Types: Cocaine    Comment: 06/12/2017 "stopped using cocaine ~ 1 yr ago; used marijuana when I was younger" last cocaine use 9 mths ago per pt on 06/15/17    Social History   Social History  . Marital status: Divorced    Spouse name: N/A  . Number of children: N/A  . Years of education: N/A   Social History Main Topics  . Smoking status: Current Every Day Smoker    Packs/day: 0.50    Years: 38.00    Types: Cigarettes  . Smokeless tobacco: Current User  Types: Chew  . Alcohol use Yes     Comment: 06/12/2017 "stopped ~ 1 yr ago"   . Drug use: Yes    Types: Cocaine     Comment: 06/12/2017 "stopped using cocaine ~ 1 yr ago; used marijuana when I was younger" last cocaine use 9 mths ago per pt on 06/15/17  . Sexual activity: Not Currently   Other Topics Concern  . None   Social History Narrative  . None   Sleep: Fair  Appetite:  Good  Current Medications: Current Facility-Administered Medications  Medication Dose Route Frequency Provider Last Rate Last Dose  . aspirin EC tablet 81 mg  81 mg Oral Daily Okonkwo, Justina A, NP   81 mg at 06/25/17 0736  . atorvastatin (LIPITOR) tablet 40 mg  40  mg Oral q1800 Okonkwo, Justina A, NP   40 mg at 06/24/17 1719  . gabapentin (NEURONTIN) capsule 300 mg  300 mg Oral BID Okonkwo, Justina A, NP   300 mg at 06/25/17 0736  . hydrochlorothiazide (HYDRODIURIL) tablet 25 mg  25 mg Oral Daily Okonkwo, Justina A, NP   25 mg at 06/25/17 0736  . hydrOXYzine (ATARAX/VISTARIL) tablet 25 mg  25 mg Oral TID PRN Beryle Lathe, Justina A, NP   25 mg at 06/24/17 2119  . ibuprofen (ADVIL,MOTRIN) tablet 600 mg  600 mg Oral Q6H PRN Micheal Likens, MD   600 mg at 06/25/17 1039  . insulin aspart (novoLOG) injection 0-15 Units  0-15 Units Subcutaneous TID WC Nira Conn A, NP   3 Units at 06/25/17 1200  . insulin aspart (novoLOG) injection 0-5 Units  0-5 Units Subcutaneous QHS Nira Conn A, NP   2 Units at 06/24/17 2118  . insulin aspart protamine- aspart (NOVOLOG MIX 70/30) injection 60 Units  60 Units Subcutaneous Q breakfast Okonkwo, Justina A, NP   60 Units at 06/25/17 0736   And  . insulin aspart protamine- aspart (NOVOLOG MIX 70/30) injection 45 Units  45 Units Subcutaneous Q supper Okonkwo, Justina A, NP   45 Units at 06/24/17 1718  . levofloxacin (LEVAQUIN) tablet 750 mg  750 mg Oral Daily Okonkwo, Justina A, NP   750 mg at 06/25/17 0736  . lidocaine (LIDODERM) 5 % 1 patch  1 patch Transdermal Daily Cobos, Rockey Situ, MD   1 patch at 06/23/17 0844  . metoprolol tartrate (LOPRESSOR) tablet 25 mg  25 mg Oral BID Okonkwo, Justina A, NP   25 mg at 06/25/17 0736  . multivitamin with minerals tablet 1 tablet  1 tablet Oral Daily Okonkwo, Justina A, NP   1 tablet at 06/25/17 0736  . nicotine (NICODERM CQ - dosed in mg/24 hours) patch 14 mg  14 mg Transdermal Daily Cobos, Rockey Situ, MD   14 mg at 06/25/17 0737  . risperiDONE (RISPERDAL) tablet 1 mg  1 mg Oral Daily Morrigan Wickens T, MD       And  . risperiDONE (RISPERDAL) tablet 2 mg  2 mg Oral QHS Jolyne Loa T, MD      . sertraline (ZOLOFT) tablet 50 mg  50 mg Oral Daily Cobos, Rockey Situ,  MD   50 mg at 06/25/17 0736  . traZODone (DESYREL) tablet 100 mg  100 mg Oral QHS PRN Cobos, Rockey Situ, MD   100 mg at 06/24/17 2119    Lab Results:  Results for orders placed or performed during the hospital encounter of 06/16/17 (from the past 48 hour(s))  Glucose, capillary  Status: Abnormal   Collection Time: 06/23/17  4:23 PM  Result Value Ref Range   Glucose-Capillary 319 (H) 65 - 99 mg/dL  Glucose, capillary     Status: Abnormal   Collection Time: 06/23/17  8:33 PM  Result Value Ref Range   Glucose-Capillary 313 (H) 65 - 99 mg/dL  Glucose, capillary     Status: Abnormal   Collection Time: 06/24/17  6:13 AM  Result Value Ref Range   Glucose-Capillary 163 (H) 65 - 99 mg/dL  Glucose, capillary     Status: Abnormal   Collection Time: 06/24/17 11:55 AM  Result Value Ref Range   Glucose-Capillary 197 (H) 65 - 99 mg/dL  Glucose, capillary     Status: Abnormal   Collection Time: 06/24/17  4:56 PM  Result Value Ref Range   Glucose-Capillary 319 (H) 65 - 99 mg/dL  Glucose, capillary     Status: Abnormal   Collection Time: 06/24/17  8:35 PM  Result Value Ref Range   Glucose-Capillary 277 (H) 65 - 99 mg/dL   Comment 1 Notify RN   Glucose, capillary     Status: Abnormal   Collection Time: 06/25/17  5:58 AM  Result Value Ref Range   Glucose-Capillary 160 (H) 65 - 99 mg/dL   Comment 1 Notify RN   Glucose, capillary     Status: Abnormal   Collection Time: 06/25/17 11:51 AM  Result Value Ref Range   Glucose-Capillary 192 (H) 65 - 99 mg/dL   Comment 1 Notify RN    Comment 2 Document in Chart     Blood Alcohol level:  Lab Results  Component Value Date   ETH <10 06/15/2017   ETH <10 06/14/2017    Metabolic Disorder Labs: Lab Results  Component Value Date   HGBA1C 9.5 (H) 06/10/2017   MPG 225.95 06/10/2017   No results found for: PROLACTIN Lab Results  Component Value Date   CHOL 85 06/10/2017   TRIG 101 06/10/2017   HDL 22 (L) 06/10/2017   CHOLHDL 3.9 06/10/2017    VLDL 20 06/10/2017   LDLCALC 43 06/10/2017    Physical Findings: AIMS: Facial and Oral Movements Muscles of Facial Expression: None, normal Lips and Perioral Area: None, normal Jaw: None, normal Tongue: None, normal,Extremity Movements Upper (arms, wrists, hands, fingers): None, normal Lower (legs, knees, ankles, toes): None, normal, Trunk Movements Neck, shoulders, hips: None, normal, Overall Severity Severity of abnormal movements (highest score from questions above): None, normal Incapacitation due to abnormal movements: None, normal Patient's awareness of abnormal movements (rate only patient's report): No Awareness, Dental Status Current problems with teeth and/or dentures?: No Does patient usually wear dentures?: No  CIWA:  CIWA-Ar Total: 1 COWS:  COWS Total Score: 2  Musculoskeletal: Strength & Muscle Tone: within normal limits Gait & Station: normal Patient leans: N/A  Psychiatric Specialty Exam: Physical Exam  Review of Systems  Constitutional: Negative for chills and fever.  Respiratory: Negative for cough.   Cardiovascular: Negative for chest pain and palpitations.  Gastrointestinal: Negative for nausea and vomiting.  Musculoskeletal: Positive for back pain and joint pain.  Psychiatric/Behavioral: Positive for hallucinations. Negative for depression and suicidal ideas.    Blood pressure 112/73, pulse 98, temperature 98.4 F (36.9 C), temperature source Oral, resp. rate 18, height 6\' 2"  (1.88 m), weight 117 kg (258 lb).Body mass index is 33.13 kg/m.  General Appearance: Casual  Eye Contact:  Good  Speech:  Clear and Coherent  Volume:  Normal  Mood:  Anxious and Depressed  Affect:  Constricted  Thought Process:  Goal Directed  Orientation:  Full (Time, Place, and Person)  Thought Content:  Hallucinations: Auditory Command:  to hurt others  Suicidal Thoughts:  No  Homicidal Thoughts:  No  Memory:  Immediate;   Good Recent;   Good Remote;   Good   Judgement:  Good  Insight:  Good  Psychomotor Activity:  Normal  Concentration:  Concentration: Fair and Attention Span: Fair  Recall:  Good  Fund of Knowledge:  Good  Language:  Good  Akathisia:  No  Handed:  Right  AIMS (if indicated):     Assets:  Communication Skills Desire for Improvement Housing  ADL's:  Intact  Cognition:  WNL  Sleep:  Number of Hours: 6.25     Treatment Plan Summary: Daily contact with patient to assess and evaluate symptoms and progress in treatment  Patient continues to exhibit symptoms of depression and psychosis. He has improvement of suicidal thoughts but notes that auditory hallucinations are now telling him to hurt others. He is able to contract for safety while on the unit. He notes that generally he is much [facility]. He is going to groups.  - Change Risperdal 1 mg BID to Risperdal 1mg  qAM+2mg  qhs (pt will be given additional 1mg  of risperdal M-TAB once after interview due to ongoing AH). - Continue Zoloft 50 mg daily - Continue Neurontin 300 mg twice a day - Continue trazodone 50 mg at bedtime.  -  Encouraged to participate in group therapy. - Social worker to continue follow on his discharge disposition.  Micheal Likenshristopher T Mickala Laton, MD 06/25/2017, 2:22 PM

## 2017-06-25 NOTE — Progress Notes (Signed)
Patient ID: Charles Orr, male   DOB: 1960/10/18, 56 y.o.   MRN: [id number]  DAR: Pt. Denies HI and visual Hallucinations. He reports that he is currently experiencing auditory hallucinations that are "loud" and passive SI. He reports that he is able to contract for safety at this time. He reports that he did not sleep well last night, his appetite is fair, his energy level is low, and his concentration is poor. He rates his depression level 9/10, hopelessness 9/10, and anxiety 8/10. Patient does continue to report lower back pain and is receiving PRN medications. He continues to refuse his Lidocaine patch. He reports that he is experiencing some agitation and irritability today however is cooperative and appropriate during his interaction with staff and his peers. At times he can be seen smiling and laughing with his peers. Support and encouragement provided to the patient. Scheduled medications administered to patient per physician's orders. Q15 minute checks are maintained for safety.

## 2017-06-25 NOTE — [facility] Group Notes (Signed)
LCSW Group Therapy Note   06/25/2017 1:15pm   Type of Therapy and Topic:  Group Therapy:  Overcoming Obstacles   Participation Level:  Did Not Attend   Description of Group:    In this group patients will be encouraged to explore what they see as obstacles to their own wellness and recovery. They will be guided to discuss their thoughts, feelings, and behaviors related to these obstacles. The group will process together ways to cope with barriers, with attention given to specific choices patients can make. Each patient will be challenged to identify changes they are motivated to make in order to overcome their obstacles. This group will be process-oriented, with patients participating in exploration of their own experiences as well as giving and receiving support and challenge from other group members.   Therapeutic Goals: 1. Patient will identify personal and current obstacles as they relate to admission. 2. Patient will identify barriers that currently interfere with their wellness or overcoming obstacles.  3. Patient will identify feelings, thought process and behaviors related to these barriers. 4. Patient will identify two changes they are willing to make to overcome these obstacles:      Summary of Patient Progress      Therapeutic Modalities:   Cognitive Behavioral Therapy Solution Focused Therapy Motivational Interviewing Relapse Prevention Therapy  Ida RogueRodney B Hillary Struss, LCSW 06/25/2017 4:27 PM

## 2017-06-26 LAB — GLUCOSE, CAPILLARY
Glucose-Capillary: 134 mg/dL — ABNORMAL HIGH (ref 65–99)
Glucose-Capillary: 235 mg/dL — ABNORMAL HIGH (ref 65–99)

## 2017-06-26 MED ORDER — INSULIN ASPART PROT & ASPART (70-30 MIX) 100 UNIT/ML ~~LOC~~ SUSP: 45.0000 [IU] | Freq: Every day | SUBCUTANEOUS | 0 refills | Status: DC

## 2017-06-26 MED ORDER — ATORVASTATIN CALCIUM 40 MG PO TABS: 40.0000 mg | ORAL_TABLET | Freq: Every day | ORAL | 0 refills | Status: DC

## 2017-06-26 MED ORDER — METOPROLOL TARTRATE 25 MG PO TABS: 25.0000 mg | ORAL_TABLET | Freq: Two times a day (BID) | ORAL | Status: DC

## 2017-06-26 MED ORDER — SERTRALINE HCL 50 MG PO TABS: 50.0000 mg | ORAL_TABLET | Freq: Every day | ORAL | 0 refills | Status: DC

## 2017-06-26 MED ORDER — GABAPENTIN 300 MG PO CAPS: 300.0000 mg | ORAL_CAPSULE | Freq: Two times a day (BID) | ORAL | 0 refills | Status: DC

## 2017-06-26 MED ORDER — ADULT MULTIVITAMIN W/MINERALS CH: 1.0000 | ORAL_TABLET | Freq: Every day | ORAL | Status: DC

## 2017-06-26 MED ORDER — HYDROXYZINE HCL 25 MG PO TABS: 25.0000 mg | ORAL_TABLET | Freq: Three times a day (TID) | ORAL | 0 refills | Status: DC | PRN

## 2017-06-26 MED ORDER — INSULIN ASPART PROT & ASPART (70-30 MIX) 100 UNIT/ML ~~LOC~~ SUSP: 60.0000 [IU] | Freq: Every day | SUBCUTANEOUS | 0 refills | Status: DC

## 2017-06-26 MED ORDER — RISPERIDONE 1 MG PO TABS: ORAL_TABLET | ORAL | 0 refills | Status: DC

## 2017-06-26 MED ORDER — HYDROCHLOROTHIAZIDE 25 MG PO TABS: 25.0000 mg | ORAL_TABLET | Freq: Every day | ORAL | 0 refills | Status: DC

## 2017-06-26 MED ORDER — LIDOCAINE 5 % EX PTCH: 1.0000 | MEDICATED_PATCH | Freq: Every day | CUTANEOUS | 0 refills | Status: DC

## 2017-06-26 MED ORDER — ASPIRIN 81 MG PO TBEC: 81.0000 mg | DELAYED_RELEASE_TABLET | Freq: Every day | ORAL | 0 refills | Status: DC

## 2017-06-26 MED ORDER — TRAZODONE HCL 100 MG PO TABS: 100.0000 mg | ORAL_TABLET | Freq: Every evening | ORAL | 0 refills | Status: DC | PRN

## 2017-06-26 MED ORDER — NICOTINE 14 MG/24HR TD PT24: 14.0000 mg | MEDICATED_PATCH | Freq: Every day | TRANSDERMAL | 0 refills | Status: DC

## 2017-06-26 NOTE — Progress Notes (Signed)
Patient ID: Charles Orr, male   DOB: October 12, 1960, 56 y.o.   MRN: [id number]   D: Patient observed watching TV and interacting well with peers on approach. Pt reports his mood has been up and down today. Pt is positive for suicidal ideation without a plan. pt  endorses AH without command. Pt states he always hears voices and describe them as harch noise. Pt attended evening wrap up group and engaged in discussions.  Denies  SI/HI.No behavioral issues noted.  A: Support and encouragement offered as needed. Medications administered as prescribed.  R: Patient is safe and cooperative on unit. Will continue to monitor  for safety and stability.

## 2017-06-26 NOTE — Discharge Summary (Signed)
Physician Discharge Summary Note  Patient:  Charles Orr is an 56 y.o., male MRN:  [id number] DOB:  [birth year] Patient phone:  [phone] (home)  Patient address:   [address] [place] [address],   Total Time spent with patient: Greater than 30 minutes  Date of Admission:  06/16/2017 Date of Discharge: 06-26-17  Reason for Admission: Command auditory hallucinations telling him to kill himself.  Principal Problem: Bipolar disorder with psychotic features [facility])  Discharge Diagnoses: Patient Active Problem List   Diagnosis Date Noted  . Bipolar disorder with psychotic features (HCC) [F31.9] 06/16/2017  . Depression [F32.9] 06/15/2017  . Other schizophrenia (HCC) [F20.89]   . Pneumonia [J18.9] 06/11/2017  . Community acquired pneumonia of right lower lobe of lung (HCC) [J18.1]   . Hyperglycemia [R73.9]   . Localized edema [R60.0]   . Shortness of breath [R06.02] 06/10/2017   Past Psychiatric History: Bipolar disorder with psychotic features.  Past Medical History:  Past Medical History:  Diagnosis Date  . Anxiety   . Arthritis    "back" (06/12/2017)  . Bipolar disorder (HCC)   . CAP (community acquired pneumonia) 06/10/2017  . Chronic lower back pain   . Degenerative disc disease, lumbar   . Depression   . Fibromyalgia   . GERD (gastroesophageal reflux disease)   . High cholesterol   . Hypertension   . Myocardial infarction (HCC) 2002  . Pneumonia 1989  . Schizophrenia (HCC)   . Type II diabetes mellitus (HCC)     Past Surgical History:  Procedure Laterality Date  . CARDIAC CATHETERIZATION  12/2006   [name] 01/18/2011  . INGUINAL HERNIA REPAIR Right   . PARATHYROIDECTOMY    . PERICARDIAL TAP  2000   ?; in [place]/notes 01/18/2011   Family History: History reviewed. No pertinent family history.  Family Psychiatric  History: See H&P.  Social History:  History  Alcohol Use  . Yes    Comment: 06/12/2017 "stopped ~ 1 yr ago"      History  Drug Use   . Types: Cocaine    Comment: 06/12/2017 "stopped using cocaine ~ 1 yr ago; used marijuana when I was younger" last cocaine use 9 mths ago per pt on 06/15/17    Social History   Social History  . Marital status: Divorced    Spouse name: N/A  . Number of children: N/A  . Years of education: N/A   Social History Main Topics  . Smoking status: Current Every Day Smoker    Packs/day: 0.50    Years: 38.00    Types: Cigarettes  . Smokeless tobacco: Current User    Types: Chew  . Alcohol use Yes     Comment: 06/12/2017 "stopped ~ 1 yr ago"   . Drug use: Yes    Types: Cocaine     Comment: 06/12/2017 "stopped using cocaine ~ 1 yr ago; used marijuana when I was younger" last cocaine use 9 mths ago per pt on 06/15/17  . Sexual activity: Not Currently   Other Topics Concern  . None   Social History Narrative  . None   Hospital Course: 56 y.o AAM, single, homeless, recently moved from [place]. Background history of Schizophrenia vs Bipolar Disorder, SUD but has not been abusing substances lately. Presented to the ER voluntarily. Reports command auditory hallucination to harm himself.  routine labs shows poor glucose control. UDS positive for opiates.  BAL<5 mg/dl. Has been on Abilify 10 mg daily for a long time. Says it has  not helped him lately. Has been hallucinating in the past three weeks. Distressed by auditory hallucinations. Has been contemplating suicide. No specific plans. No intent. Wants to get better. No violent thoughts towards others. No homicidal thoughts. No access to weapons. We have agreed to optimize his Abilify.   Charles Orr was admitted to the adult unit for worsening symptoms of bipolar disorder with psychotic features. His UDS was positive for opioid on admission, however, he was not presenting with any substance withdrawal symptoms as a result did not receive any detoxification treatments. He was recommended for mood stabilization treatments.   After his admission assessment,  Charles Orr presenting symptoms were identified. The medication regimen were discussed and initiated targeting those presenting symptoms. He was oriented to the unit and encouraged to participate in the unit programming. His other pre-existing medical problems were identified and treated by resuming his pertinent home medication for those health issues.        During the course of his hospitalization, Charles Orr was evaluated each day by a clinical provider to ascertain his response to his treatment regimen. The medication changes & adjustments were made according to his need. And as the day goes by, improvement was noted as evidenced by his report of decreasing symptoms, improved sleep, appetite, affect, medication tolerance, behavior, and participation in the unit programming. He was required on daily basis to complete a self inventory asssessment noting mood, mental status, pain, new symptoms, anxiety and concerns. His symptoms responded well to his treatment regimen, being in a therapeutic and supportive environment also assisted in his mood stability.   On this day of his hospital discharge, Charles Orr was in much improved condition than upon admission. His symptoms were reported as significantly decreased or resolved completely. Upon discharge, he denies SI/HI and voiced no AVH. He was motivated to continue taking medication with a goal of continued improvement in mental health. He was medicated & discharge on; Gabapentin 300 mg for agitation, Hydroxyzine 25 mg prn for anxiety, Nicotine patch 14 mg for smoking cessation, Risperdal 1 mg & 2 mg respectively for mood control & Sertraline 50 mg for depression. He is currently being discharged to follow-up care on an outpatient basis as noted below. He is provided with all the necessary information needed to make these appointments without problems. He was provided with all the necessary information needed to make his outpatient without problems. He left [facility] in no apparent  distress with all personal belongings. Transportation per city bus. [facility] assisted with bus pass.  Physical Findings: AIMS: Facial and Oral Movements Muscles of Facial Expression: None, normal Lips and Perioral Area: None, normal Jaw: None, normal Tongue: None, normal,Extremity Movements Upper (arms, wrists, hands, fingers): None, normal Lower (legs, knees, ankles, toes): None, normal, Trunk Movements Neck, shoulders, hips: None, normal, Overall Severity Severity of abnormal movements (highest score from questions above): None, normal Incapacitation due to abnormal movements: None, normal Patient's awareness of abnormal movements (rate only patient's report): No Awareness, Dental Status Current problems with teeth and/or dentures?: No Does patient usually wear dentures?: No  CIWA:  CIWA-Ar Total: 1 COWS:  COWS Total Score: 2  Musculoskeletal: Strength & Muscle Tone: within normal limits Gait & Station: normal Patient leans: N/A  Psychiatric Specialty Exam: Physical Exam  Constitutional: He appears well-developed.  HENT:  Head: Normocephalic.  Eyes: Pupils are equal, round, and reactive to light.  Neck: Normal range of motion.  Cardiovascular: Normal rate.   Respiratory: Effort normal.  GI: Soft.  Genitourinary:  Genitourinary Comments:  Deferred  Musculoskeletal: Normal range of motion.  Neurological: He is alert.  Skin: Skin is warm.    Review of Systems  Constitutional: Negative.   HENT: Negative.   Eyes: Negative.   Respiratory: Negative.   Cardiovascular: Negative.   Gastrointestinal: Negative.   Genitourinary: Negative.   Musculoskeletal: Negative.   Skin: Negative.   Neurological: Negative.   Endo/Heme/Allergies: Negative.   Psychiatric/Behavioral: Positive for depression (Stabilized with medication prior to discharge), hallucinations (Hx. auditory hallucinations) and substance abuse ( Hx. opioid use disorder). Negative for memory loss and suicidal ideas. The  patient has insomnia (Stabilized with medication prior to discharge). The patient is not nervous/anxious.     Blood pressure 107/72, pulse 96, temperature 97.6 F (36.4 C), temperature source Oral, resp. rate 20, height 6\' 2"  (1.88 m), weight 117 kg (258 lb).Body mass index is 33.13 kg/m.  See H&P   Have you used any form of tobacco in the last 30 days? (Cigarettes, Smokeless Tobacco, Cigars, and/or Pipes): Yes  Has this patient used any form of tobacco in the last 30 days? (Cigarettes, Smokeless Tobacco, Cigars, and/or Pipes):Yes, recommended with Nicotine patch 14 mg for smoking cessation.  Blood Alcohol level:  Lab Results  Component Value Date   ETH <10 06/15/2017   ETH <10 06/14/2017   Metabolic Disorder Labs:  Lab Results  Component Value Date   HGBA1C 9.5 (H) 06/10/2017   MPG 225.95 06/10/2017   No results found for: PROLACTIN Lab Results  Component Value Date   CHOL 85 06/10/2017   TRIG 101 06/10/2017   HDL 22 (L) 06/10/2017   CHOLHDL 3.9 06/10/2017   VLDL 20 06/10/2017   LDLCALC 43 06/10/2017   See Psychiatric Specialty Exam and Suicide Risk Assessment completed by Attending Physician prior to discharge.  Discharge destination:  Home  Is patient on multiple antipsychotic therapies at discharge:  No   Has Patient had three or more failed trials of antipsychotic monotherapy by history:  No  Recommended Plan for Multiple Antipsychotic Therapies: NA  Allergies as of 06/26/2017   No Known Allergies     Medication List    STOP taking these medications   acetaminophen 325 MG tablet Commonly known as:  TYLENOL   ARIPiprazole 10 MG tablet Commonly known as:  ABILIFY   levofloxacin 750 MG tablet Commonly known as:  LEVAQUIN   polyethylene glycol packet Commonly known as:  MIRALAX / GLYCOLAX     TAKE these medications     Indication  aspirin 81 MG EC tablet Take 1 tablet (81 mg total) by mouth daily. For heart health What changed:  additional  instructions  Indication:  Heart health   atorvastatin 40 MG tablet Commonly known as:  LIPITOR Take 1 tablet (40 mg total) by mouth daily. For high Cholesterol What changed:  additional instructions  Indication:  Inherited Homozygous Hypercholesterolemia, Elevation of Both Cholesterol and Triglycerides in Blood   gabapentin 300 MG capsule Commonly known as:  NEURONTIN Take 1 capsule (300 mg total) by mouth 2 (two) times daily. For agitation What changed:  additional instructions  Indication:  Agitation   hydrochlorothiazide 25 MG tablet Commonly known as:  HYDRODIURIL Take 1 tablet (25 mg total) by mouth daily. For high blood pressure What changed:  additional instructions  Indication:  High Blood Pressure Disorder   hydrOXYzine 25 MG tablet Commonly known as:  ATARAX/VISTARIL Take 1 tablet (25 mg total) by mouth 3 (three) times daily as needed for anxiety.  Indication:  Feeling Anxious  insulin aspart protamine- aspart (70-30) 100 UNIT/ML injection Commonly known as:  NOVOLOG MIX 70/30 Inject 0.45 mLs (45 Units total) into the skin daily with supper. For diabetes management What changed:  You were already taking a medication with the same name, and this prescription was added. Make sure you understand how and when to take each.  Indication:  Type 2 Diabetes   insulin aspart protamine- aspart (70-30) 100 UNIT/ML injection Commonly known as:  NOVOLOG MIX 70/30 Inject 0.6 mLs (60 Units total) into the skin daily with breakfast. For diabetes management What changed:  how much to take  when to take this  additional instructions  Indication:  Type 2 Diabetes   lidocaine 5 % Commonly known as:  LIDODERM Place 1 patch onto the skin daily. Remove & Discard patch within 12 hours or as directed by MD: For pain management  Indication:  pain management   metoprolol tartrate 25 MG tablet Commonly known as:  LOPRESSOR Take 1 tablet (25 mg total) by mouth 2 (two) times daily.  For high blood pressure What changed:  additional instructions  Indication:  High Blood Pressure Disorder   multivitamin with minerals Tabs tablet Take 1 tablet by mouth daily. For Vitamin supplement What changed:  additional instructions  Indication:  Vitamin supplement   nicotine 14 mg/24hr patch Commonly known as:  NICODERM CQ - dosed in mg/24 hours Place 1 patch (14 mg total) onto the skin daily. (May purchase over the counter): For smoking cessation  Indication:  Nicotine Addiction   risperiDONE 1 MG tablet Commonly known as:  RISPERDAL Take 1 tablet (1 mg) by mouth in the mornings & 2 tablets (2 mg) at bedtime: For mood control  Indication:  Mood control   sertraline 50 MG tablet Commonly known as:  ZOLOFT Take 1 tablet (50 mg total) by mouth daily. For depression  Indication:  Major Depressive Disorder   traZODone 100 MG tablet Commonly known as:  DESYREL Take 1 tablet (100 mg total) by mouth at bedtime as needed for sleep.  Indication:  Trouble Sleeping      Follow-up Information    [facility] Follow up on 06/27/2017.   Why:  Follow up appointment at 9:30am on 06/28/17 at [facility] [place].   Contact information: [address] [place] [address] [phone]          Follow-up recommendations: Activity:  As tolerated Diet: As recommended by your primary care doctor. Keep all scheduled follow-up appointments as recommended.  Comments: Patient is instructed prior to discharge to: Take all medications as prescribed by his/her mental healthcare provider. Report any adverse effects and or reactions from the medicines to his/her outpatient provider promptly. Patient has been instructed & cautioned: To not engage in alcohol and or illegal drug use while on prescription medicines. In the event of worsening symptoms, patient is instructed to call the crisis hotline, 911 and or go to the nearest ED for appropriate evaluation and treatment of  symptoms. To follow-up with his/her primary care provider for your other medical issues, concerns and or health care needs.   Signed: Sanjuana KavaNwoko, Agnes I, NP, PMHNP, FNP-BC 06/26/2017, 11:17 AM   Patient seen, Suicide Assessment Completed.  Disposition Plan Reviewed   Charles SchatzKarl Warwick is a 56 y/o M with history of bipolar affective disorder with psychotic features who presented with worsening CAH to jump from a bridge and worsening depression. Pt has stressor of homelessness; he came to [place] from [place] in  hopes of connecting with his son, but he initially did not know how to contact his son, and eventually found that he would not be able to stay with his son. Pt was started on zoloft and titrated to dose of 50mg  qDay. He was started on risperdal and titrated to dose of Risperdal 1mg  qAm and 2mg  qhs. Today, he reports that he has significant improvement of mood. He denies SI/HI. He denies AH/VH. He is tolerating his medications without difficulty or side effect. He is sleeping well and his appetite is adequate. He is able to engage in safety planning including to return to [facility] or contact emergency services should he feel unable to maintain his own safety. Pt was in agreement to discharge directly to [facility] shelter in [place], which he arranged for himself. Pt will have follow up in the [facility] area. He had no further questions, comments, or concerns. Plan Of Care/Follow-up recommendations:  -DC to outpatient level of care - Continue Risperdal 1mg  QAM + 2mg  Qhs - Continue zoloft 50mg  qDay - Continue gabapentin 300mg  BID - Continue all other home medications without changes: lipitor,  Hydrochlorothiazide, insulin Novolog, and metoprolol  Activity:  as tolerated Diet:  diabetic diet Tests:  N/A Other:  see above for discharge treatment plan  Micheal Likens, MD

## 2017-06-26 NOTE — [facility] Suicide Risk Assessment (Addendum)
[facility]   Principal Problem: Bipolar disorder with psychotic features [facility](HCC) Discharge Diagnoses:  Patient Active Problem List   Diagnosis Date Noted  . Bipolar disorder with psychotic features (HCC) [F31.9] 06/16/2017  . Depression [F32.9] 06/15/2017  . Other schizophrenia (HCC) [F20.89]   . Pneumonia [J18.9] 06/11/2017  . Community acquired pneumonia of right lower lobe of lung (HCC) [J18.1]   . Hyperglycemia [R73.9]   . Localized edema [R60.0]   . Shortness of breath [R06.02] 06/10/2017    Total Time spent with patient: 30 minutes  Musculoskeletal: Strength & Muscle Tone: within normal limits Gait & Station: normal Patient leans: N/A  Psychiatric Specialty Exam: Review of Systems  Constitutional: Negative for chills and fever.  Respiratory: Negative for cough and shortness of breath.   Cardiovascular: Negative for chest pain.  Gastrointestinal: Negative for nausea and vomiting.  Psychiatric/Behavioral: Negative for depression, hallucinations and suicidal ideas.    Blood pressure 107/72, pulse 96, temperature 97.6 F (36.4 C), temperature source Oral, resp. rate 20, height 6\' 2"  (1.88 m), weight 117 kg (258 lb).Body mass index is 33.13 kg/m.  General Appearance: Fairly Groomed  Patent attorneyye Contact::  Good  Speech:  Clear and Coherent  Volume:  Normal  Mood:  Euthymic  Affect:  Congruent and Constricted  Thought Process:  Coherent  Orientation:  Full (Time, Place, and Person)  Thought Content:  Logical  Suicidal Thoughts:  No  Homicidal Thoughts:  No  Memory:  Immediate;   Good Recent;   Good Remote;   Good  Judgement:  Fair  Insight:  Fair  Psychomotor Activity:  Normal  Concentration:  Good  Recall:  Good  Fund of Knowledge:Good  Language: Fair  Akathisia:  No  Handed:    AIMS (if indicated):     Assets:  Communication Skills Desire for Improvement Resilience Social Support  Sleep:  Number of Hours: 6.75  Cognition: WNL  ADL's:   Intact   Mental Status Per Nursing Assessment::   On Admission:     Demographic Factors:  Male, Divorced or widowed, Low socioeconomic status, Living alone and Unemployed  Loss Factors: Financial problems/change in socioeconomic status  Historical Factors: NA  Risk Reduction Factors:   Positive social support, Positive therapeutic relationship and Positive coping skills or problem solving skills  Continued Clinical Symptoms:  Bipolar Disorder:   Depressive phase  Cognitive Features That Contribute To Risk:  None    Suicide Risk:  Mild:  Suicidal ideation of limited frequency, intensity, duration, and specificity.  There are no identifiable plans, no associated intent, mild dysphoria and related symptoms, good self-control (both objective and subjective assessment), few other risk factors, and identifiable protective factors, including available and accessible social support.  Follow-up Information    [facility] Follow up on 06/27/2017.   Why:  Follow up appointment at 9:30am on 06/28/17 at [facility] in [place].   Contact information: [address] [place] [address] [phone]           Subjective data: - Charles Orr is a 56 y/o M with history of bipolar affective disorder with psychotic features who presented with worsening CAH to jump from a bridge and worsening depression. Pt has stressor of homelessness; [place] in hopes of connecting with his son, but he initially did not know how to contact his son, and eventually found that he would not be able to stay with his son. Pt was started on zoloft  and titrated to dose of 50mg  qDay. He was started on risperdal and titrated to dose of Risperdal 1mg  qAm and 2mg  qhs. Today, he reports that he has significant improvement of mood. He denies SI/HI. He denies AH/VH. He is tolerating his medications without difficulty or side effect. He is sleeping well and his appetite is adequate. He  is able to engage in safety planning including to return to [facility] or contact emergency services should he feel unable to maintain his own safety. Pt was in agreement to discharge directly to [facility] shelter in [place], which he arranged for himself. Pt will have follow up in the [facility] area. He had no further questions, comments, or concerns.    Plan Of Care/Follow-up recommendations:  -DC to outpatient level of care - Continue Risperdal 1mg  QAM + 2mg  Qhs - Continue zoloft 50mg  qDay - Continue gabapentin 300mg  BID - Continue all other home medications without changes: lipitor,  Hydrochlorothiazide, insulin Novolog, and metoprolol   Activity:  as tolerated Diet:  diabetic diet Tests:  N/A Other:  see above for discharge treatment plan  Micheal Likens, MD 06/26/2017, 12:35 PM

## 2017-06-26 NOTE — Progress Notes (Signed)
Recreation Therapy Notes  Date: 06/26/17 Time: 1000 Location: 500 Hall Dayroom  Group Topic: Decision Making, Communication  Goal Area(s) Addresses:  Patient will identify factors that guided their decision making.  Patient will identify benefit of healthy decision making post d/c.   Behavioral Response: Engaged, Attentive  Intervention:  Choices in a Jar Game  Activity: Choices in a Jar.  LRT read a card from the jar.  Each card gave the patients two options.  Patients would have to tell with scenario they chose and why.   Education: Pharmacist, communityocial Skills, Scientist, physiologicalDecision Making, Discharge Planning   Education Outcome: Acknowledges education  Clinical Observations/Feedback: Pt arrived a little late but became fully engaged in the activity.  Pt explained to peer in order to use her intuition and get good results, it has to be guided by a higher power.  Pt went on to say we all get signs when things are good or when they are bad and that these signals guide our decisions.   Caroll RancherMarjette Gael Londo, LRT/CTRS         Caroll RancherLindsay, Ranulfo Kall A 06/26/2017 12:04 PM

## 2017-06-26 NOTE — Progress Notes (Signed)
  [facility] :  Will you be returning to the same living situation after discharge:  No. At discharge, do you have transportation home?: Yes,  bus pass, PART bus pass Do you have the ability to pay for your medications: Yes,  MCD  Release of information consent forms completed and in the chart;  Patient's signature needed at discharge.  Patient to Follow up at: Follow-up Information    [facility] Follow up on 06/27/2017.   Why:  Follow up appointment at 9:30am on 06/28/17 at [facility] in [place].   Contact information: [address] [place] [address] [phone]           Next level of care provider has access to [facility] Link:yes  Safety Planning and Suicide Prevention discussed: Yes,  yes  Have you used any form of tobacco in the last 30 days? (Cigarettes, Smokeless Tobacco, Cigars, and/or Pipes): Yes  Has patient been referred to the Quitline?: Patient refused referral  Patient has been referred for addiction treatment: N/A  Ida RogueRodney B Denajah Farias, LCSW 06/26/2017, 2:19 PM

## 2017-06-26 NOTE — Plan of Care (Signed)
Problem: [facility] Participation in Recreation Therapeutic Interventions Goal: STG-Patient will identify at least five coping skills for ** STG: Coping Skills - Patient will be able to identify at least 5 coping skills for SI by conclusion of recreation therapy tx  Outcome: Completed/Met Date Met: 06/26/17 Pt was able to identify coping skills at completion of coping skills recreation therapy sessions.  Charles Orr, LRT/CTRS

## 2017-06-26 NOTE — Tx Team (Signed)
Interdisciplinary Treatment and Diagnostic Plan Update  06/26/2017 Time of Session: 9:00 AM Charles Orr MRN: [id number]  Principal Diagnosis: Bipolar disorder with psychotic features [facility])  Secondary Diagnoses: Principal Problem:   Bipolar disorder with psychotic features (HCC)   Current Medications:  Current Facility-Administered Medications  Medication Dose Route Frequency Provider Last Rate Last Dose  . aspirin EC tablet 81 mg  81 mg Oral Daily Okonkwo, Justina A, NP   81 mg at 06/26/17 0805  . atorvastatin (LIPITOR) tablet 40 mg  40 mg Oral q1800 Okonkwo, Justina A, NP   40 mg at 06/25/17 1716  . gabapentin (NEURONTIN) capsule 300 mg  300 mg Oral BID Okonkwo, Justina A, NP   300 mg at 06/26/17 0805  . hydrochlorothiazide (HYDRODIURIL) tablet 25 mg  25 mg Oral Daily Okonkwo, Justina A, NP   25 mg at 06/26/17 0805  . hydrOXYzine (ATARAX/VISTARIL) tablet 25 mg  25 mg Oral TID PRN Beryle Lathe, Justina A, NP   25 mg at 06/24/17 2119  . ibuprofen (ADVIL,MOTRIN) tablet 600 mg  600 mg Oral Q6H PRN Micheal Likens, MD   600 mg at 06/26/17 0808  . insulin aspart (novoLOG) injection 0-15 Units  0-15 Units Subcutaneous TID WC Nira Conn A, NP   5 Units at 06/26/17 1209  . insulin aspart (novoLOG) injection 0-5 Units  0-5 Units Subcutaneous QHS Nira Conn A, NP   3 Units at 06/25/17 2149  . insulin aspart protamine- aspart (NOVOLOG MIX 70/30) injection 60 Units  60 Units Subcutaneous Q breakfast Okonkwo, Justina A, NP   60 Units at 06/26/17 0809   And  . insulin aspart protamine- aspart (NOVOLOG MIX 70/30) injection 45 Units  45 Units Subcutaneous Q supper Okonkwo, Justina A, NP   45 Units at 06/25/17 1715  . lidocaine (LIDODERM) 5 % 1 patch  1 patch Transdermal Daily Cobos, Rockey Situ, MD   1 patch at 06/23/17 0844  . metoprolol tartrate (LOPRESSOR) tablet 25 mg  25 mg Oral BID Okonkwo, Justina A, NP   25 mg at 06/26/17 0804  . multivitamin with minerals tablet 1 tablet  1 tablet Oral  Daily Okonkwo, Justina A, NP   1 tablet at 06/26/17 0806  . nicotine (NICODERM CQ - dosed in mg/24 hours) patch 14 mg  14 mg Transdermal Daily Cobos, Rockey Situ, MD   14 mg at 06/26/17 0823  . risperiDONE (RISPERDAL) tablet 1 mg  1 mg Oral Daily Micheal Likens, MD   1 mg at 06/26/17 0805   And  . risperiDONE (RISPERDAL) tablet 2 mg  2 mg Oral QHS Micheal Likens, MD   2 mg at 06/25/17 2106  . sertraline (ZOLOFT) tablet 50 mg  50 mg Oral Daily Cobos, Rockey Situ, MD   50 mg at 06/26/17 0804  . traZODone (DESYREL) tablet 100 mg  100 mg Oral QHS PRN Cobos, Rockey Situ, MD   100 mg at 06/25/17 2106   PTA Medications: Prescriptions Prior to Admission  Medication Sig Dispense Refill Last Dose  . acetaminophen (TYLENOL) 325 MG tablet Take 1 tablet (325 mg total) by mouth every 6 (six) hours as needed. (Patient taking differently: Take 1,300 mg by mouth every 6 (six) hours as needed for mild pain. ) 120 tablet 0 06/14/2017 at Unknown time  . ARIPiprazole (ABILIFY) 10 MG tablet Take 1 tablet (10 mg total) by mouth daily. 30 tablet 0 06/13/2017 at Unknown time  . gabapentin (NEURONTIN) 300 MG capsule Take 1 capsule (  300 mg total) by mouth 2 (two) times daily. 60 capsule 0 06/13/2017 at Unknown time  . insulin aspart protamine- aspart (NOVOLOG MIX 70/30) (70-30) 100 UNIT/ML injection Inject 0.45-0.6 mLs (45-60 Units total) into the skin See admin instructions. Inject 60 units subcutaneously every morning and 45 units at night (Patient taking differently: Inject 45-60 Units into the skin See admin instructions. Inject 65 units subcutaneously every morning and 45 units at night) 50 mL 0 06/14/2017 at Unknown time  . levofloxacin (LEVAQUIN) 750 MG tablet Take 1 tablet (750 mg total) by mouth daily. 4 tablet 0 06/13/2017 at Unknown time  . metoprolol tartrate (LOPRESSOR) 25 MG tablet Take 1 tablet (25 mg total) by mouth 2 (two) times daily. 60 tablet 0 06/13/2017 at 1500  . polyethylene glycol  (MIRALAX / GLYCOLAX) packet Take 17 g by mouth 2 (two) times daily. 14 each 0 06/13/2017 at Unknown time  . [DISCONTINUED] aspirin 81 MG EC tablet Take 1 tablet (81 mg total) by mouth daily. 30 tablet 0 06/13/2017 at Unknown time  . [DISCONTINUED] atorvastatin (LIPITOR) 40 MG tablet Take 1 tablet (40 mg total) by mouth daily. 30 tablet 0 06/13/2017 at Unknown time  . [DISCONTINUED] hydrochlorothiazide (HYDRODIURIL) 25 MG tablet Take 1 tablet (25 mg total) by mouth daily. 30 tablet 0 06/13/2017 at Unknown time  . [DISCONTINUED] Multiple Vitamin (MULTIVITAMIN WITH MINERALS) TABS tablet Take 1 tablet by mouth daily.   06/13/2017 at Unknown time    Patient Stressors: Financial difficulties Medication change or noncompliance  Patient Strengths: Geographical information systems officer for treatment/growth Supportive family/friends  Treatment Modalities: Medication Management, Group therapy, Case management,  1 to 1 session with clinician, Psychoeducation, Recreational therapy.   Physician Treatment Plan for Primary Diagnosis: Bipolar disorder with psychotic features (HCC) Long Term Goal(s): Improvement in symptoms so as ready for discharge Improvement in symptoms so as ready for discharge   Short Term Goals: Ability to identify changes in lifestyle to reduce recurrence of condition will improve Ability to disclose and discuss suicidal ideas Ability to demonstrate self-control will improve Ability to identify changes in lifestyle to reduce recurrence of condition will improve Ability to verbalize feelings will improve Ability to maintain clinical measurements within normal limits will improve Compliance with prescribed medications will improve  Medication Management: Evaluate patient's response, side effects, and tolerance of medication regimen.  Therapeutic Interventions: 1 to 1 sessions, Unit Group sessions and Medication administration.  Evaluation of Outcomes: Adequate for  Discharge  Physician Treatment Plan for Secondary Diagnosis: Principal Problem:   Bipolar disorder with psychotic features (HCC)  Long Term Goal(s): Improvement in symptoms so as ready for discharge Improvement in symptoms so as ready for discharge   Short Term Goals: Ability to identify changes in lifestyle to reduce recurrence of condition will improve Ability to disclose and discuss suicidal ideas Ability to demonstrate self-control will improve Ability to identify changes in lifestyle to reduce recurrence of condition will improve Ability to verbalize feelings will improve Ability to maintain clinical measurements within normal limits will improve Compliance with prescribed medications will improve     Medication Management: Evaluate patient's response, side effects, and tolerance of medication regimen.  Therapeutic Interventions: 1 to 1 sessions, Unit Group sessions and Medication administration.  Evaluation of Outcomes: Adequate for Discharge   10/18: Patient is presenting with ongoing improvement in mood and psychotic symptoms as per his own report and RN report. Affect is less constricted and hallucinations have decreased.  -Continue  Neurontin 300 mg  BID for pain and anxiety -Continue Risperdal 1 mg BID for psychosis and mood  -Continue Zoloft  50 mgrs QDAY for depression  -Continue Trazodone 100 mg PRN for insomnia as needed. -Initiated Ibuprofen 800 mg tid x 3 days for back pain.   RN Treatment Plan for Primary Diagnosis: Bipolar disorder with psychotic features (HCC) Long Term Goal(s): Knowledge of disease and therapeutic regimen to maintain health will improve  Short Term Goals: Ability to participate in decision making will improve, Ability to verbalize feelings will improve and Ability to disclose and discuss suicidal ideas  Medication Management: RN will administer medications as ordered by provider, will assess and evaluate patient's response and provide education  to patient for prescribed medication. RN will report any adverse and/or side effects to prescribing provider.  Therapeutic Interventions: 1 on 1 counseling sessions, Psychoeducation, Medication administration, Evaluate responses to treatment, Monitor vital signs and CBGs as ordered, Perform/monitor CIWA, COWS, AIMS and Fall Risk screenings as ordered, Perform wound care treatments as ordered.  Evaluation of Outcomes: Adequate for Discharge   LCSW Treatment Plan for Primary Diagnosis: Bipolar disorder with psychotic features [facility](HCC) Long Term Goal(s): Safe transition to appropriate next level of care at discharge, Engage patient in therapeutic group addressing interpersonal concerns.  Short Term Goals: Engage patient in aftercare planning with referrals and resources, Increase ability to appropriately verbalize feelings, Increase emotional regulation, Identify triggers associated with mental health/substance abuse issues and Increase skills for wellness and recovery  Therapeutic Interventions: Assess for all discharge needs, 1 to 1 time with Social worker, Explore available resources and support systems, Assess for adequacy in community support network, Educate family and significant other(s) on suicide prevention, Complete Psychosocial Assessment, Interpersonal group therapy.  Evaluation of Outcomes: Adequate for Discharge   Progress in Treatment: Attending groups: Yes. Participating in groups: Yes. Taking medication as prescribed: Yes. Toleration medication: Yes. Family/Significant other contact made: No, will contact:  collateral w patient consent; currently declining Patient understands diagnosis: Yes.  CSW assessing Discussing patient identified problems/goals with staff: Yes. Medical problems stabilized or resolved: Yes. Denies suicidal/homicidal ideation: Yes. contracts for safety on unit, admitted w suicidal ideation, coping skills inadequate for community stressors Issues/concerns  per patient self-inventory: No. Other: NA  New problem(s) identified: Yes, Describe:  patient lives in [place], states he is looking for son in [place] but doesnt know where he is; potentially homeless and lacks access to resources  New Short Term/Long Term Goal(s):  Wants stability in housing, medication stability, increased coping skills  Discharge Plan or Barriers:Pt states he needs to get into a shelter if d/ced before the end of the month as he has no money until his next disability check.  [facility] in which he is accepted.  He is calling both [facility] and [facility] starting today  Reason for Continuation of Hospitalization:  D/C today  [facility] bed confirmed at [facility], Follow up [facility]  Estimated Length of Stay:    Attendees: Patient: 06/26/2017 2:22 PM  Physician: Jaclynn Major [place] MD 06/26/2017 2:22 PM  Nursing: Fletcher AnonJane RN, Elizabeth RN 06/26/2017 2:22 PM  RN Care Manager: 06/26/2017 2:22 PM  Social Worker: Richelle Itood Loucile Posner LCSW 06/26/2017 2:22 PM  Recreational Therapist:  06/26/2017 2:22 PM  Other:  06/26/2017 2:22 PM  Other:  06/26/2017 2:22 PM  Other: 06/26/2017 2:22 PM    Scribe for Treatment Team: Ida Rogueodney B Ellyana Crigler, LCSW 06/26/2017 2:22 PM

## 2017-06-26 NOTE — Progress Notes (Signed)
Inpatient Diabetes Program Recommendations  AACE/ADA: New Consensus Statement on Inpatient Glycemic Control (2015)  Target Ranges:  Prepandial:   less than 140 mg/dL      Peak postprandial:   less than 180 mg/dL (1-2 hours)      Critically ill patients:  140 - 180 mg/dL   Results for Arlis PortaROSS, Amdrew J (MRN [id number]) as of 06/26/2017 08:28  Ref. Range 06/25/2017 05:58 06/25/2017 11:51 06/25/2017 17:13 06/25/2017 20:43  Glucose-Capillary Latest Ref Range: 65 - 99 mg/dL 409160 (H) 811192 (H) 914355 (H) 291 (H)    Home DM Meds: 70/30 Insulin- 65 units AM/ 45 units PM  Current Insulin Orders: 70/30 Insulin- 60 units AM/ 45 units PM                                       Novolog Moderate Correction Scale/ SSI (0-15 units) TID AC + HS       MD- Please consider the following in-hospital insulin adjustments:  Please increase PM dose of 70/30 Insulin to 50 units PM     --Will follow patient during hospitalization--  Ambrose FinlandJeannine Johnston Cledis Sohn RN, MSN, CDE Diabetes Coordinator Inpatient Glycemic Control Team Team Pager: [phone] (8a-5p)

## 2017-06-26 NOTE — Progress Notes (Signed)
Recreation Therapy Notes  INPATIENT RECREATION TR PLAN  Patient Details Name: Charles Orr MRN: [id number] DOB: 03-Nov-1960 Today's Date: 06/26/2017  Rec Therapy Plan Is patient appropriate for Therapeutic Recreation?: Yes Treatment times per week: about 3 days Estimated Length of Stay: 5-7 days TR Treatment/Interventions: Group participation (Comment)  Discharge Criteria Pt will be discharged from therapy if:: Discharged Treatment plan/goals/alternatives discussed and agreed upon by:: Patient/family  Discharge Summary Short term goals set: Patient will be able to identify at least 5 coping skills for SI by conclusion of recreation therapy tx  Short term goals met: Complete Progress toward goals comments: Groups attended Which groups?: Coping skills, Communication, Other (Comment) (Decision making) Reason goals not met: None Therapeutic equipment acquired: N/A Reason patient discharged from therapy: Discharge from hospital Pt/family agrees with progress & goals achieved: Yes Date patient discharged from therapy: 06/26/17   Victorino Sparrow, LRT/CTRS  Ria Comment, Warnie Belair A 06/26/2017, 12:31 PM

## 2017-06-26 NOTE — Progress Notes (Signed)
Pt d/c from the hospital with a bus pass. All items returned. D/C instructions given, prescriptions given and samples given. Pt denies si and hi. 

## 2017-07-07 ENCOUNTER — Emergency Department (HOSPITAL_COMMUNITY): Payer: Medicaid Other

## 2017-07-07 ENCOUNTER — Emergency Department (HOSPITAL_COMMUNITY)
Admission: EM | Admit: 2017-07-07 | Discharge: 2017-07-07 | Disposition: A | Discharge status: Home / Self Care | Payer: Medicaid Other | Attending: Emergency Medicine | Admitting: Emergency Medicine

## 2017-07-07 ENCOUNTER — Encounter (HOSPITAL_COMMUNITY): Payer: Self-pay | Admitting: Emergency Medicine

## 2017-07-07 ENCOUNTER — Emergency Department (HOSPITAL_COMMUNITY)
Admission: EM | Admit: 2017-07-07 | Discharge: 2017-07-08 | Disposition: A | Discharge status: Home / Self Care | Payer: Medicaid Other | Source: Home / Self Care | Attending: Emergency Medicine | Admitting: Emergency Medicine

## 2017-07-07 DIAGNOSIS — F17210 Nicotine dependence, cigarettes, uncomplicated: Secondary | ICD-10-CM | POA: Diagnosis not present

## 2017-07-07 DIAGNOSIS — Z794 Long term (current) use of insulin: Secondary | ICD-10-CM | POA: Insufficient documentation

## 2017-07-07 DIAGNOSIS — E119 Type 2 diabetes mellitus without complications: Secondary | ICD-10-CM | POA: Diagnosis not present

## 2017-07-07 DIAGNOSIS — F209 Schizophrenia, unspecified: Secondary | ICD-10-CM | POA: Diagnosis not present

## 2017-07-07 DIAGNOSIS — F250 Schizoaffective disorder, bipolar type: Secondary | ICD-10-CM | POA: Diagnosis present

## 2017-07-07 DIAGNOSIS — R45851 Suicidal ideations: Secondary | ICD-10-CM | POA: Diagnosis not present

## 2017-07-07 DIAGNOSIS — R1030 Lower abdominal pain, unspecified: Secondary | ICD-10-CM

## 2017-07-07 DIAGNOSIS — F329 Major depressive disorder, single episode, unspecified: Secondary | ICD-10-CM | POA: Insufficient documentation

## 2017-07-07 DIAGNOSIS — Z79899 Other long term (current) drug therapy: Secondary | ICD-10-CM | POA: Diagnosis not present

## 2017-07-07 DIAGNOSIS — I10 Essential (primary) hypertension: Secondary | ICD-10-CM | POA: Diagnosis not present

## 2017-07-07 DIAGNOSIS — R197 Diarrhea, unspecified: Secondary | ICD-10-CM

## 2017-07-07 DIAGNOSIS — Z7982 Long term (current) use of aspirin: Secondary | ICD-10-CM | POA: Diagnosis not present

## 2017-07-07 LAB — URINALYSIS, ROUTINE W REFLEX MICROSCOPIC
BACTERIA UA: NONE SEEN
BILIRUBIN URINE: NEGATIVE
HGB URINE DIPSTICK: NEGATIVE
KETONES UR: NEGATIVE mg/dL
LEUKOCYTES UA: NEGATIVE
NITRITE: NEGATIVE
PH: 5 (ref 5.0–8.0)
PROTEIN: NEGATIVE mg/dL
Specific Gravity, Urine: 1.024 (ref 1.005–1.030)

## 2017-07-07 LAB — COMPREHENSIVE METABOLIC PANEL
ALBUMIN: 4 g/dL (ref 3.5–5.0)
ALK PHOS: 73 U/L (ref 38–126)
ALK PHOS: 87 U/L (ref 38–126)
ALT: 26 U/L (ref 17–63)
ALT: 26 U/L (ref 17–63)
AST: 21 U/L (ref 15–41)
AST: 20 U/L (ref 15–41)
Albumin: 4.2 g/dL (ref 3.5–5.0)
Anion gap: 11 (ref 5–15)
Anion gap: 10 (ref 5–15)
BILIRUBIN TOTAL: 0.7 mg/dL (ref 0.3–1.2)
BUN: 15 mg/dL (ref 6–20)
BUN: 17 mg/dL (ref 6–20)
CALCIUM: 9.4 mg/dL (ref 8.9–10.3)
CALCIUM: 9.3 mg/dL (ref 8.9–10.3)
CHLORIDE: 103 mmol/L (ref 101–111)
CO2: 23 mmol/L (ref 22–32)
CO2: 22 mmol/L (ref 22–32)
CREATININE: 1.1 mg/dL (ref 0.61–1.24)
Chloride: 101 mmol/L (ref 101–111)
Creatinine, Ser: 1.21 mg/dL (ref 0.61–1.24)
GFR calc Af Amer: >60 mL/min (ref >60)
GFR calc Af Amer: >60 mL/min (ref >60)
Glucose, Bld: 257 mg/dL — ABNORMAL HIGH (ref 65–99)
Glucose, Bld: 401 mg/dL — ABNORMAL HIGH (ref 65–99)
POTASSIUM: 4.5 mmol/L (ref 3.5–5.1)
Potassium: 4.1 mmol/L (ref 3.5–5.1)
Sodium: 137 mmol/L (ref 135–145)
Sodium: 133 mmol/L — ABNORMAL LOW (ref 135–145)
TOTAL PROTEIN: 7.5 g/dL (ref 6.5–8.1)
Total Bilirubin: 0.8 mg/dL (ref 0.3–1.2)
Total Protein: 7.8 g/dL (ref 6.5–8.1)

## 2017-07-07 LAB — CBC
HCT: 43.9 % (ref 39.0–52.0)
HEMATOCRIT: 43.5 % (ref 39.0–52.0)
HEMOGLOBIN: 15 g/dL (ref 13.0–17.0)
HEMOGLOBIN: 14.8 g/dL (ref 13.0–17.0)
MCH: 27.5 pg (ref 26.0–34.0)
MCH: 27.4 pg (ref 26.0–34.0)
MCHC: 34.2 g/dL (ref 30.0–36.0)
MCHC: 34 g/dL (ref 30.0–36.0)
MCV: 80.4 fL (ref 78.0–100.0)
MCV: 80.6 fL (ref 78.0–100.0)
PLATELETS: 243 10*3/uL (ref 150–400)
Platelets: 233 10*3/uL (ref 150–400)
RBC: 5.46 MIL/uL (ref 4.22–5.81)
RBC: 5.4 MIL/uL (ref 4.22–5.81)
RDW: 13.6 % (ref 11.5–15.5)
RDW: 13.9 % (ref 11.5–15.5)
WBC: 9.4 10*3/uL (ref 4.0–10.5)
WBC: 7.8 10*3/uL (ref 4.0–10.5)

## 2017-07-07 LAB — CBG MONITORING, ED
Glucose-Capillary: 221 mg/dL — ABNORMAL HIGH (ref 65–99)
Glucose-Capillary: 327 mg/dL — ABNORMAL HIGH (ref 65–99)
Glucose-Capillary: 275 mg/dL — ABNORMAL HIGH (ref 65–99)

## 2017-07-07 LAB — SALICYLATE LEVEL: Salicylate Lvl: <7 mg/dL (ref 2.8–30.0)

## 2017-07-07 LAB — ACETAMINOPHEN LEVEL: Acetaminophen (Tylenol), Serum: <10 ug/mL — ABNORMAL LOW (ref 10–30)

## 2017-07-07 LAB — PROTIME-INR
INR: 1.1
Prothrombin Time: 14.1 seconds (ref 11.4–15.2)

## 2017-07-07 LAB — ETHANOL

## 2017-07-07 LAB — MAGNESIUM: Magnesium: 2.3 mg/dL (ref 1.7–2.4)

## 2017-07-07 LAB — LIPASE, BLOOD: Lipase: 27 U/L (ref 11–51)

## 2017-07-07 MED ORDER — TRAZODONE HCL 100 MG PO TABS: 100.0000 mg | ORAL_TABLET | Freq: Every evening | ORAL | Status: DC | PRN

## 2017-07-07 MED ORDER — METOPROLOL TARTRATE 25 MG PO TABS
25.0000 mg | ORAL_TABLET | Freq: Two times a day (BID) | ORAL | Status: DC
  Administered 2017-07-07: 25 mg via ORAL
  Filled 2017-07-07: qty 1

## 2017-07-07 MED ORDER — ONDANSETRON HCL 4 MG/2ML IJ SOLN
4.0000 mg | Freq: Once | INTRAMUSCULAR | Status: AC
  Administered 2017-07-07: 4 mg via INTRAVENOUS
  Filled 2017-07-07: qty 2

## 2017-07-07 MED ORDER — INSULIN ASPART 100 UNIT/ML IV SOLN
8.0000 [IU] | Freq: Once | INTRAVENOUS | Status: AC
  Administered 2017-07-07: 8 [IU] via INTRAVENOUS
  Filled 2017-07-07: qty 0.08

## 2017-07-07 MED ORDER — ADULT MULTIVITAMIN W/MINERALS CH
1.0000 | ORAL_TABLET | Freq: Every day | ORAL | Status: DC
  Administered 2017-07-07: 1 via ORAL
  Filled 2017-07-07: qty 1

## 2017-07-07 MED ORDER — INSULIN ASPART PROT & ASPART (70-30 MIX) 100 UNIT/ML ~~LOC~~ SUSP: 60.0000 [IU] | Freq: Every day | SUBCUTANEOUS | Status: DC

## 2017-07-07 MED ORDER — SERTRALINE HCL 50 MG PO TABS
50.0000 mg | ORAL_TABLET | Freq: Every day | ORAL | Status: DC
  Administered 2017-07-07: 50 mg via ORAL
  Filled 2017-07-07: qty 1

## 2017-07-07 MED ORDER — ATORVASTATIN CALCIUM 40 MG PO TABS
40.0000 mg | ORAL_TABLET | Freq: Every day | ORAL | Status: DC
  Administered 2017-07-07: 40 mg via ORAL
  Filled 2017-07-07: qty 1

## 2017-07-07 MED ORDER — MORPHINE SULFATE (PF) 4 MG/ML IV SOLN
4.0000 mg | Freq: Once | INTRAVENOUS | Status: AC
  Administered 2017-07-07: 4 mg via INTRAVENOUS
  Filled 2017-07-07: qty 1

## 2017-07-07 MED ORDER — HYDROXYZINE HCL 25 MG PO TABS: 25.0000 mg | ORAL_TABLET | Freq: Three times a day (TID) | ORAL | Status: DC | PRN

## 2017-07-07 MED ORDER — HYDROCHLOROTHIAZIDE 25 MG PO TABS
25.0000 mg | ORAL_TABLET | Freq: Every day | ORAL | Status: DC
Start: 2017-07-07 — End: 2017-07-07
  Administered 2017-07-07: 25 mg via ORAL
  Filled 2017-07-07: qty 1

## 2017-07-07 MED ORDER — GABAPENTIN 300 MG PO CAPS
300.0000 mg | ORAL_CAPSULE | Freq: Two times a day (BID) | ORAL | Status: DC
  Administered 2017-07-07: 300 mg via ORAL
  Filled 2017-07-07: qty 1

## 2017-07-07 MED ORDER — RISPERIDONE 1 MG PO TABS
1.0000 mg | ORAL_TABLET | Freq: Two times a day (BID) | ORAL | Status: DC
  Administered 2017-07-07: 1 mg via ORAL
  Filled 2017-07-07: qty 1

## 2017-07-07 MED ORDER — IOPAMIDOL (ISOVUE-300) INJECTION 61%
INTRAVENOUS | Status: AC
  Administered 2017-07-07: 100 mL via INTRAVENOUS
  Filled 2017-07-07: qty 100

## 2017-07-07 MED ORDER — INSULIN ASPART PROT & ASPART (70-30 MIX) 100 UNIT/ML ~~LOC~~ SUSP: 45.0000 [IU] | Freq: Every day | SUBCUTANEOUS | Status: DC

## 2017-07-07 MED ORDER — HYDROCODONE-ACETAMINOPHEN 5-325 MG PO TABS: ORAL_TABLET | ORAL | 0 refills | Status: DC

## 2017-07-07 MED ORDER — SODIUM CHLORIDE 0.9 % IV SOLN
INTRAVENOUS | Status: DC
  Administered 2017-07-07: 19:00:00 via INTRAVENOUS

## 2017-07-07 MED ORDER — ASPIRIN EC 81 MG PO TBEC
81.0000 mg | DELAYED_RELEASE_TABLET | Freq: Every day | ORAL | Status: DC
  Administered 2017-07-07: 81 mg via ORAL
  Filled 2017-07-07: qty 1

## 2017-07-07 NOTE — ED Notes (Signed)
Dr Adrian PrinceNorman/Jamison DNP aware of patients continued suicidal thoughts with a plan.

## 2017-07-07 NOTE — ED Notes (Signed)
Dr Norman and Jamison DNP into see 
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
## 2017-07-07 NOTE — ED Provider Notes (Signed)
[facility]-EMERGENCY DEPT Provider Note   CSN: [id number] Arrival date & time: 07/07/17  1703     History   Chief Complaint Chief Complaint  Patient presents with  . Abdominal Pain  . Diarrhea  . Weakness     HPI   Blood pressure 119/79, pulse 76, temperature 97.6 F (36.4 C), temperature source Oral, resp. rate 18, SpO2 97 %.  Charles Orr is a 56 y.o. male has medical history significant for type 2 diabetes, schizophrenia, ACS after eating complaining of bilateral lower abdominal pain, generalized weakness and nonbloody, non-melanotic diarrhea that started 3 hours ago.  He denies fevers, chills emesis but endorses nausea on review of systems.  He states that the pain is moderate, no exacerbating or alleviating factors and no pain medication taken prior to arrival.  He has been taking Levaquin for pneumonia.  He is has not yet completed the course.  He denies any chest pain, fever chills or cough.  This patient is not sure if he is anticoagulated.   Past Medical History:  Diagnosis Date  . Anxiety   . Arthritis    "back" (06/12/2017)  . Bipolar disorder (HCC)   . CAP (community acquired pneumonia) 06/10/2017  . Chronic lower back pain   . Degenerative disc disease, lumbar   . Depression   . Fibromyalgia   . GERD (gastroesophageal reflux disease)   . High cholesterol   . Hypertension   . Myocardial infarction (HCC) 2002  . Pneumonia 1989  . Schizophrenia (HCC)   . Type II diabetes mellitus [facility](HCC)     Patient Active Problem List   Diagnosis Date Noted  . Schizoaffective disorder, bipolar type (HCC) 06/16/2017  . Depression 06/15/2017  . Other schizophrenia (HCC)   . Pneumonia 06/11/2017  . Community acquired pneumonia of right lower lobe of lung (HCC)   . Hyperglycemia   . Localized edema   . Shortness of breath 06/10/2017    Past Surgical History:  Procedure Laterality Date  . CARDIAC CATHETERIZATION  12/2006   Charles Orr/notes 01/18/2011  .  INGUINAL HERNIA REPAIR Right   . PARATHYROIDECTOMY    . PERICARDIAL TAP  2000   ?; in [place]/notes 01/18/2011       Home Medications    Prior to Admission medications   Medication Sig Start Date End Date Taking? Authorizing Provider  acetaminophen (TYLENOL) 500 MG tablet Take 1,000 mg by mouth every 6 (six) hours as needed for moderate pain.   Yes [provider]  aspirin 81 MG EC tablet Take 1 tablet (81 mg total) by mouth daily. For heart health 06/26/17  Yes Armandina StammerNwoko, Agnes I, NP  atorvastatin (LIPITOR) 40 MG tablet Take 1 tablet (40 mg total) by mouth daily. For high Cholesterol 06/26/17  Yes Armandina StammerNwoko, Agnes I, NP  gabapentin (NEURONTIN) 300 MG capsule Take 1 capsule (300 mg total) by mouth 2 (two) times daily. For agitation 06/26/17  Yes Nwoko,  KindredAgnes I, NP  hydrochlorothiazide (HYDRODIURIL) 25 MG tablet Take 1 tablet (25 mg total) by mouth daily. For high blood pressure 06/26/17  Yes Nwoko,  KindredAgnes I, NP  hydrOXYzine (ATARAX/VISTARIL) 25 MG tablet Take 1 tablet (25 mg total) by mouth 3 (three) times daily as needed for anxiety. 06/26/17  Yes Nwoko,  KindredAgnes I, NP  insulin aspart protamine- aspart (NOVOLOG MIX 70/30) (70-30) 100 UNIT/ML injection Inject 0.6 mLs (60 Units total) into the skin daily with breakfast. For diabetes management Patient taking differently: Inject 45-60 Units into the skin 2 (  two) times daily with a meal. 60 units in the morning and 45 units at dinner time 06/27/17  Yes Nwoko, Nohea Kras Kindred I, NP  metoprolol tartrate (LOPRESSOR) 25 MG tablet Take 1 tablet (25 mg total) by mouth 2 (two) times daily. For high blood pressure 06/26/17  Yes Nwoko, Jesenya Bowditch Kindred I, NP  Multiple Vitamin (MULTIVITAMIN WITH MINERALS) TABS tablet Take 1 tablet by mouth daily. For Vitamin supplement 06/26/17  Yes Nwoko, Mikey Maffett Kindred I, NP  risperiDONE (RISPERDAL) 1 MG tablet Take 1 tablet (1 mg) by mouth in the mornings & 2 tablets (2 mg) at bedtime: For mood control Patient taking differently: Take 1-2 mg by mouth  2 (two) times daily. Take 1 tablet (1 mg) by mouth in the mornings & 2 tablets (2 mg) at bedtime: For mood control 06/26/17  Yes Nwoko, Chantea Surace Kindred I, NP  sertraline (ZOLOFT) 50 MG tablet Take 1 tablet (50 mg total) by mouth daily. For depression 06/27/17  Yes Armandina Stammer I, NP  traZODone (DESYREL) 100 MG tablet Take 1 tablet (100 mg total) by mouth at bedtime as needed for sleep. 06/26/17  Yes Armandina Stammer I, NP  HYDROcodone-acetaminophen (NORCO/VICODIN) 5-325 MG tablet Take 1-2 tablets by mouth every 6 hours as needed for pain. 07/07/17   Charles Orr, Charles Reining, PA-C  insulin aspart protamine- aspart (NOVOLOG MIX 70/30) (70-30) 100 UNIT/ML injection Inject 0.45 mLs (45 Units total) into the skin daily with supper. For diabetes management Patient not taking: Reported on 07/07/2017 06/26/17   Armandina Stammer I, NP  lidocaine (LIDODERM) 5 % Place 1 patch onto the skin daily. Remove & Discard patch within 12 hours or as directed by MD: For pain management Patient not taking: Reported on 07/07/2017 06/27/17   Armandina Stammer I, NP  nicotine (NICODERM CQ - DOSED IN MG/24 HOURS) 14 mg/24hr patch Place 1 patch (14 mg total) onto the skin daily. (May purchase over the counter): For smoking cessation Patient not taking: Reported on 07/07/2017 06/27/17   Armandina Stammer I, NP    Family History No family history on file.  Social History Social History  Substance Use Topics  . Smoking status: Current Every Day Smoker    Packs/day: 0.50    Years: 38.00    Types: Cigarettes  . Smokeless tobacco: Current User    Types: Chew  . Alcohol use Yes     Comment: 06/12/2017 "stopped ~ 1 yr ago"      Allergies   Patient has no known allergies.   Review of Systems Review of Systems  A complete review of systems was obtained and all systems are negative except as noted in the HPI and PMH.    Physical Exam Updated Vital Signs BP 115/62   Pulse 61   Temp 97.6 F (36.4 C) (Oral)   Resp 18   SpO2 97%   Physical Exam    Constitutional: He is oriented to person, place, and time. He appears well-developed and well-nourished. No distress.  HENT:  Head: Normocephalic and atraumatic.  Mouth/Throat: Oropharynx is clear and moist.  Eyes: Pupils are equal, round, and reactive to light. Conjunctivae and EOM are normal.  Neck: Normal range of motion.  Cardiovascular: Normal rate, regular rhythm and intact distal pulses.   Pulmonary/Chest: Effort normal and breath sounds normal.  Abdominal: Soft. He exhibits no distension and no mass. There is tenderness. There is no rebound and no guarding. No hernia.  Mild tenderness to palpation in the bilateral lower quadrants with no guarding or rebound.  Psoas, Rovsing  and obturator are negative.  Musculoskeletal: Normal range of motion.  Neurological: He is alert and oriented to person, place, and time. Cranial nerve deficit: .npmdm.  Skin: He is not diaphoretic.  Psychiatric: He has a normal mood and affect.  Nursing note and vitals reviewed.    ED Treatments / Results  Labs (all labs ordered are listed, but only abnormal results are displayed) Labs Reviewed  COMPREHENSIVE METABOLIC PANEL - Abnormal; Notable for the following:       Result Value   Sodium 133 (*)    Glucose, Bld 401 (*)    All other components within normal limits  URINALYSIS, ROUTINE W REFLEX MICROSCOPIC - Abnormal; Notable for the following:    Glucose, UA >=500 (*)    Squamous Epithelial / LPF 0-5 (*)    All other components within normal limits  CBG MONITORING, ED - Abnormal; Notable for the following:    Glucose-Capillary 275 (*)    All other components within normal limits  GASTROINTESTINAL PANEL BY PCR, STOOL (REPLACES STOOL CULTURE)  C DIFFICILE QUICK SCREEN W PCR REFLEX  LIPASE, BLOOD  CBC  MAGNESIUM  PROTIME-INR    EKG  EKG Interpretation None       Radiology Ct Abdomen Pelvis W Contrast  Result Date: 07/07/2017 CLINICAL DATA:  56 year old male with abdominal pain. EXAM:  CT ABDOMEN AND PELVIS WITH CONTRAST TECHNIQUE: Multidetector CT imaging of the abdomen and pelvis was performed using the standard protocol following bolus administration of intravenous contrast. CONTRAST:  <See Chart> ISOVUE-300 IOPAMIDOL (ISOVUE-300) INJECTION 61% COMPARISON:  None. FINDINGS: Lower chest: There is mild eventration of the right hemidiaphragm. Right lung base linear atelectatic changes/scarring. Pneumonia is not entirely excluded. Clinical correlation is recommended. There is mild bilateral lower lobe bronchiectatic changes. There is no intra-abdominal free air or free fluid. Hepatobiliary: There is apparent mild fatty infiltration of the liver. No intrahepatic biliary ductal dilatation. The gallbladder is unremarkable. Pancreas: Unremarkable. No pancreatic ductal dilatation or surrounding inflammatory changes. Spleen: Normal in size without focal abnormality. Adrenals/Urinary Tract: The adrenal glands are unremarkable. There is a 2.5 cm left renal inferior pole cyst. There is no hydronephrosis on either side. There is symmetric enhancement and excretion of contrast by both kidneys. The visualized ureters and urinary bladder appear unremarkable. Stomach/Bowel: There is no evidence of bowel obstruction or active inflammation. Normal appendix. Vascular/Lymphatic: Mild aortoiliac atherosclerotic disease. The IVC is grossly unremarkable. No portal venous gas. There is no adenopathy. Reproductive: The prostate and seminal vesicles are grossly unremarkable. Other: Small fat containing umbilical hernia. No fluid collection or inflammatory changes. Musculoskeletal: No acute or significant osseous findings. IMPRESSION: 1. No acute intra-abdominal or pelvic pathology. No bowel obstruction or active inflammation. Normal appendix. 2. Mild fatty liver. 3. Mild eventration of the right hemidiaphragm with right lung base linear atelectasis/scarring. Mild bilateral lower lobe bronchiectatic changes. Electronically  Signed   By: Elgie Collard M.D.   On: 07/07/2017 23:08    Procedures Procedures (including critical care time)  Medications Ordered in ED Medications  0.9 %  sodium chloride infusion ( Intravenous Stopped 07/07/17 1903)  morphine 4 MG/ML injection 4 mg (4 mg Intravenous Given 07/07/17 1833)  ondansetron (ZOFRAN) injection 4 mg (4 mg Intravenous Given 07/07/17 1833)  insulin aspart (novoLOG) injection 8 Units (8 Units Intravenous Given 07/07/17 1952)  morphine 4 MG/ML injection 4 mg (4 mg Intravenous Given 07/07/17 2230)  ondansetron (ZOFRAN) injection 4 mg (4 mg Intravenous Given 07/07/17 2230)  iopamidol (ISOVUE-300) 61 % injection (  100 mLs Intravenous Contrast Given 07/07/17 2234)     Initial Impression / Assessment and Plan / ED Course  I have reviewed the triage vital signs and the nursing notes.  Pertinent labs & imaging results that were available during my care of the patient were reviewed by me and considered in my medical decision making (see chart for details).     Vitals:   07/07/17 1930 07/07/17 1944 07/07/17 2015 07/07/17 2145  BP: 121/73 121/73 127/64 115/62  Pulse: (!) 57 (!) 59 (!) 58 61  Resp:  20 16 18   Temp:      TempSrc:      SpO2: 100% 100% 100% 97%    Medications  0.9 %  sodium chloride infusion ( Intravenous Stopped 07/07/17 1903)  morphine 4 MG/ML injection 4 mg (4 mg Intravenous Given 07/07/17 1833)  ondansetron (ZOFRAN) injection 4 mg (4 mg Intravenous Given 07/07/17 1833)  insulin aspart (novoLOG) injection 8 Units (8 Units Intravenous Given 07/07/17 1952)  morphine 4 MG/ML injection 4 mg (4 mg Intravenous Given 07/07/17 2230)  ondansetron (ZOFRAN) injection 4 mg (4 mg Intravenous Given 07/07/17 2230)  iopamidol (ISOVUE-300) 61 % injection (100 mLs Intravenous Contrast Given 07/07/17 2234)    Charles Orr is 56 y.o. male presenting with diarrhea and lower abdominal pain.  Abdominal exam is nonsurgical, he has been taking Levaquin, would like to test fluids  and blood work reassuring, no leukocytosis.  Patient cannot give sample for stool testing.  He is requiring multiple doses of IV pain medication, no peritoneal signs but given his persistent pain will obtain CT.  CT without abnormality, the patient again cannot give sample for stool testing.  Patient will follow closely with primary care and we have had an extensive discussion of return precautions.  Evaluation does not show pathology that would require ongoing emergent intervention or inpatient treatment. Pt is hemodynamically stable and mentating appropriately. Discussed findings and plan with patient/guardian, who agrees with care plan. All questions answered. Return precautions discussed and outpatient follow up given.    Final Clinical Impressions(s) / ED Diagnoses   Final diagnoses:  Lower abdominal pain  Diarrhea, unspecified type    New Prescriptions New Prescriptions   HYDROCODONE-ACETAMINOPHEN (NORCO/VICODIN) 5-325 MG TABLET    Take 1-2 tablets by mouth every 6 hours as needed for pain.     Kaylyn Lim 07/07/17 2345    Pricilla Loveless, MD 07/08/17 [phone]

## 2017-07-07 NOTE — BH Assessment (Signed)
[facility] Assessment Progress Note   Per Charles ConnJason Berry, NP pt to be monitored for safety and re-evaluated by psych due SI with a plan. EDP Charles Orr, Robert, PA-C and pt's nurse Charles BusmanAllen, RN have been notified and are in agreement with plan of care.  Princess BruinsAquicha Amber Williard, MSW, LCSW Therapeutic Triage Specialist  [phone]

## 2017-07-07 NOTE — ED Triage Notes (Signed)
Patient here with complaints of abdominal pain, weakness, diarrhea that started 3 hours ago. Denies eating anything unusual.

## 2017-07-07 NOTE — ED Notes (Signed)
Up to the bathroom 

## 2017-07-07 NOTE — Discharge Instructions (Signed)
Take vicodin for breakthrough pain, do not drink alcohol, drive, care for children or do other critical tasks while taking vicodin. ° °Please follow with your primary care doctor in the next 2 days for a check-up. They must obtain records for further management.  ° °Do not hesitate to return to the Emergency Department for any new, worsening or concerning symptoms.  ° °

## 2017-07-07 NOTE — ED Notes (Signed)
[id number] Expected date:  Expected time:  Means of arrival:  Comments: Charles Orr

## 2017-07-07 NOTE — ED Provider Notes (Signed)
[facility]-EMERGENCY DEPT Provider Note   CSN: [id number] Arrival date & time: 07/07/17  0416     History   Chief Complaint Chief Complaint  Patient presents with  . Suicidal    HPI Charles Orr is a 56 y.o. male.  Patient presents to the emergency department with chief complaint of suicidal ideation.  He states that he wants to jump off of a bridge and kill himself.  He states that he has been feeling this for the past day or so.  He reports using drugs and alcohol.  He is unable to specify specifically what he has been taking.  He states that he has tried to use anything that he can.  He denies any fevers, chills, chest pain, shortness of breath, or abdominal pain.  He does report chronic sciatica pain.  He denies any other associated symptoms.  There are no modifying factors.   The history is provided by the patient. No language interpreter was used.    Past Medical History:  Diagnosis Date  . Anxiety   . Arthritis    "back" (06/12/2017)  . Bipolar disorder (HCC)   . CAP (community acquired pneumonia) 06/10/2017  . Chronic lower back pain   . Degenerative disc disease, lumbar   . Depression   . Fibromyalgia   . GERD (gastroesophageal reflux disease)   . High cholesterol   . Hypertension   . Myocardial infarction (HCC) 2002  . Pneumonia 1989  . Schizophrenia (HCC)   . Type II diabetes mellitus [facility](HCC)     Patient Active Problem List   Diagnosis Date Noted  . Bipolar disorder with psychotic features (HCC) 06/16/2017  . Depression 06/15/2017  . Other schizophrenia (HCC)   . Pneumonia 06/11/2017  . Community acquired pneumonia of right lower lobe of lung (HCC)   . Hyperglycemia   . Localized edema   . Shortness of breath 06/10/2017    Past Surgical History:  Procedure Laterality Date  . CARDIAC CATHETERIZATION  12/2006   [name]/notes 01/18/2011  . INGUINAL HERNIA REPAIR Right   . PARATHYROIDECTOMY    . PERICARDIAL TAP  2000   ?; in  [place]/notes 01/18/2011       Home Medications    Prior to Admission medications   Medication Sig Start Date End Date Taking? Authorizing Provider  aspirin 81 MG EC tablet Take 1 tablet (81 mg total) by mouth daily. For heart health 06/26/17  Yes Armandina StammerNwoko, Agnes I, NP  atorvastatin (LIPITOR) 40 MG tablet Take 1 tablet (40 mg total) by mouth daily. For high Cholesterol 06/26/17  Yes Armandina StammerNwoko, Agnes I, NP  gabapentin (NEURONTIN) 300 MG capsule Take 1 capsule (300 mg total) by mouth 2 (two) times daily. For agitation 06/26/17  Yes Nwoko, Nicole KindredAgnes I, NP  hydrochlorothiazide (HYDRODIURIL) 25 MG tablet Take 1 tablet (25 mg total) by mouth daily. For high blood pressure 06/26/17  Yes Nwoko, Nicole KindredAgnes I, NP  hydrOXYzine (ATARAX/VISTARIL) 25 MG tablet Take 1 tablet (25 mg total) by mouth 3 (three) times daily as needed for anxiety. 06/26/17  Yes Armandina StammerNwoko, Agnes I, NP  insulin aspart protamine- aspart (NOVOLOG MIX 70/30) (70-30) 100 UNIT/ML injection Inject 0.6 mLs (60 Units total) into the skin daily with breakfast. For diabetes management 06/27/17  Yes Armandina StammerNwoko, Agnes I, NP  insulin aspart protamine- aspart (NOVOLOG MIX 70/30) (70-30) 100 UNIT/ML injection Inject 0.45 mLs (45 Units total) into the skin daily with supper. For diabetes management 06/26/17  Yes Sanjuana KavaNwoko, Agnes I, NP  metoprolol tartrate (LOPRESSOR) 25 MG tablet Take 1 tablet (25 mg total) by mouth 2 (two) times daily. For high blood pressure 06/26/17  Yes Nwoko, Nicole Kindred I, NP  Multiple Vitamin (MULTIVITAMIN WITH MINERALS) TABS tablet Take 1 tablet by mouth daily. For Vitamin supplement 06/26/17  Yes Nwoko, Nicole Kindred I, NP  risperiDONE (RISPERDAL) 1 MG tablet Take 1 tablet (1 mg) by mouth in the mornings & 2 tablets (2 mg) at bedtime: For mood control 06/26/17  Yes Nwoko, Nicole Kindred I, NP  sertraline (ZOLOFT) 50 MG tablet Take 1 tablet (50 mg total) by mouth daily. For depression 06/27/17  Yes Armandina Stammer I, NP  traZODone (DESYREL) 100 MG tablet Take 1 tablet (100 mg  total) by mouth at bedtime as needed for sleep. 06/26/17  Yes Armandina Stammer I, NP  lidocaine (LIDODERM) 5 % Place 1 patch onto the skin daily. Remove & Discard patch within 12 hours or as directed by MD: For pain management Patient not taking: Reported on 07/07/2017 06/27/17   Armandina Stammer I, NP  nicotine (NICODERM CQ - DOSED IN MG/24 HOURS) 14 mg/24hr patch Place 1 patch (14 mg total) onto the skin daily. (May purchase over the counter): For smoking cessation Patient not taking: Reported on 07/07/2017 06/27/17   Sanjuana Kava, NP    Family History History reviewed. No pertinent family history.  Social History Social History  Substance Use Topics  . Smoking status: Current Every Day Smoker    Packs/day: 0.50    Years: 38.00    Types: Cigarettes  . Smokeless tobacco: Current User    Types: Chew  . Alcohol use Yes     Comment: 06/12/2017 "stopped ~ 1 yr ago"      Allergies   Patient has no known allergies.   Review of Systems Review of Systems  All other systems reviewed and are negative.    Physical Exam Updated Vital Signs BP 119/80 (BP Location: Left Arm)   Pulse 93   Temp 98.4 F (36.9 C) (Oral)   Resp 16   Ht 6\' 2"  (1.88 m)   Wt 117 kg (258 lb)   SpO2 99%   BMI 33.13 kg/m   Physical Exam  Constitutional: He is oriented to person, place, and time. He appears well-developed and well-nourished.  HENT:  Head: Normocephalic and atraumatic.  Eyes: Pupils are equal, round, and reactive to light. Conjunctivae and EOM are normal. Right eye exhibits no discharge. Left eye exhibits no discharge. No scleral icterus.  Neck: Normal range of motion. Neck supple. No JVD present.  Cardiovascular: Normal rate, regular rhythm and normal heart sounds.  Exam reveals no gallop and no friction rub.   No murmur heard. Pulmonary/Chest: Effort normal and breath sounds normal. No respiratory distress. He has no wheezes. He has no rales. He exhibits no tenderness.  Abdominal: Soft. He  exhibits no distension and no mass. There is no tenderness. There is no rebound and no guarding.  Musculoskeletal: Normal range of motion. He exhibits no edema or tenderness.  Neurological: He is alert and oriented to person, place, and time.  Skin: Skin is warm and dry.  Psychiatric: He has a normal mood and affect. His behavior is normal. Judgment and thought content normal.  Nursing note and vitals reviewed.    ED Treatments / Results  Labs (all labs ordered are listed, but only abnormal results are displayed) Labs Reviewed  COMPREHENSIVE METABOLIC PANEL - Abnormal; Notable for the following:       Result  Value   Glucose, Bld 257 (*)    All other components within normal limits  ACETAMINOPHEN LEVEL - Abnormal; Notable for the following:    Acetaminophen (Tylenol), Serum <10 (*)    All other components within normal limits  ETHANOL  SALICYLATE LEVEL  CBC  RAPID URINE DRUG SCREEN, HOSP PERFORMED    EKG  EKG Interpretation None       Radiology No results found.  Procedures Procedures (including critical care time)  Medications Ordered in ED Medications - No data to display   Initial Impression / Assessment and Plan / ED Course  I have reviewed the triage vital signs and the nursing notes.  Pertinent labs & imaging results that were available during my care of the patient were reviewed by me and considered in my medical decision making (see chart for details).    Patient was suicidal ideation.  Appears to be medically clear for TTS evaluation.  Vital signs are stable.  Patient is alert and oriented.  Will consult TTS.  Final Clinical Impressions(s) / ED Diagnoses   Final diagnoses:  None    New Prescriptions New Prescriptions   No medications on file     Roxy Horseman, Cordelia Poche 07/07/17 1610    Charlynne Pander, MD 07/07/17 2303

## 2017-07-07 NOTE — ED Notes (Addendum)
EDP-dr rees- aware of blood sugar-OK  To dc , no treatment at this time

## 2017-07-07 NOTE — ED Notes (Signed)
Patient states he is unable to use the restroom and give a stool sample. Patient instructed to call out when he can give a sample.

## 2017-07-07 NOTE — ED Notes (Signed)
Per TTS:  Pt to be evaluated in am by psychiatrist.

## 2017-07-07 NOTE — ED Notes (Signed)
SBAR Report received from previous nurse. Pt received calm and visible on unit. Pt denies current HI, A/V H, depression, anxiety, or pain at this time, and appears otherwise stable and free of distress but endorses SI. Pt reminded of camera surveillance, q 15 min rounds, and rules of the milieu. Will continue to assess. 

## 2017-07-07 NOTE — ED Notes (Addendum)
Written dc instructions reviewed with pt.  Pt encouraged to take medications as instructed and follow up with his OP provider.  Pt is aware that the peer support councilors will contact him concerning detox.  Pt also encouraged to seek treatment for concerns./changes.  Pt verbalized understanding.  Bus pas given.  Pt ambulatory to dc area w/ mHt, belongings returned after leaving the area.

## 2017-07-07 NOTE — ED Notes (Signed)
Pt unable to provide urine specimen at this time

## 2017-07-07 NOTE — ED Notes (Signed)
Patient attempted to provide stool sample and was unable. EDPA made aware.

## 2017-07-07 NOTE — ED Notes (Signed)
Patient to CT.

## 2017-07-07 NOTE — ED Triage Notes (Signed)
Patient brought in by [employer]. Patient stated to [employer] that he was having mental issues and wanted to kill himself.
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
## 2017-07-09 ENCOUNTER — Other Ambulatory Visit: Payer: Self-pay

## 2017-07-09 ENCOUNTER — Inpatient Hospital Stay (HOSPITAL_COMMUNITY)
Admission: EM | Admit: 2017-07-09 | Discharge: 2017-07-11 | DRG: 287 | Disposition: A | Discharge status: Home / Self Care | Payer: Medicaid Other | Attending: Internal Medicine | Admitting: Internal Medicine

## 2017-07-09 ENCOUNTER — Emergency Department (HOSPITAL_COMMUNITY): Payer: Medicaid Other

## 2017-07-09 ENCOUNTER — Encounter (HOSPITAL_COMMUNITY): Payer: Self-pay | Admitting: Emergency Medicine

## 2017-07-09 DIAGNOSIS — R079 Chest pain, unspecified: Secondary | ICD-10-CM

## 2017-07-09 DIAGNOSIS — Z794 Long term (current) use of insulin: Secondary | ICD-10-CM

## 2017-07-09 DIAGNOSIS — M797 Fibromyalgia: Secondary | ICD-10-CM | POA: Diagnosis present

## 2017-07-09 DIAGNOSIS — I10 Essential (primary) hypertension: Secondary | ICD-10-CM | POA: Diagnosis not present

## 2017-07-09 DIAGNOSIS — F419 Anxiety disorder, unspecified: Secondary | ICD-10-CM | POA: Diagnosis present

## 2017-07-09 DIAGNOSIS — F250 Schizoaffective disorder, bipolar type: Secondary | ICD-10-CM | POA: Diagnosis present

## 2017-07-09 DIAGNOSIS — I252 Old myocardial infarction: Secondary | ICD-10-CM

## 2017-07-09 DIAGNOSIS — Z955 Presence of coronary angioplasty implant and graft: Secondary | ICD-10-CM

## 2017-07-09 DIAGNOSIS — Z79899 Other long term (current) drug therapy: Secondary | ICD-10-CM

## 2017-07-09 DIAGNOSIS — Z720 Tobacco use: Secondary | ICD-10-CM

## 2017-07-09 DIAGNOSIS — K219 Gastro-esophageal reflux disease without esophagitis: Secondary | ICD-10-CM | POA: Diagnosis not present

## 2017-07-09 DIAGNOSIS — Z6833 Body mass index (BMI) 33.0-33.9, adult: Secondary | ICD-10-CM

## 2017-07-09 DIAGNOSIS — G8929 Other chronic pain: Secondary | ICD-10-CM | POA: Diagnosis present

## 2017-07-09 DIAGNOSIS — R739 Hyperglycemia, unspecified: Secondary | ICD-10-CM | POA: Diagnosis present

## 2017-07-09 DIAGNOSIS — Z7982 Long term (current) use of aspirin: Secondary | ICD-10-CM

## 2017-07-09 DIAGNOSIS — E785 Hyperlipidemia, unspecified: Secondary | ICD-10-CM

## 2017-07-09 DIAGNOSIS — F17210 Nicotine dependence, cigarettes, uncomplicated: Secondary | ICD-10-CM | POA: Diagnosis present

## 2017-07-09 DIAGNOSIS — M545 Low back pain: Secondary | ICD-10-CM | POA: Diagnosis present

## 2017-07-09 DIAGNOSIS — E669 Obesity, unspecified: Secondary | ICD-10-CM | POA: Diagnosis present

## 2017-07-09 DIAGNOSIS — I209 Angina pectoris, unspecified: Secondary | ICD-10-CM

## 2017-07-09 DIAGNOSIS — E1165 Type 2 diabetes mellitus with hyperglycemia: Secondary | ICD-10-CM | POA: Diagnosis present

## 2017-07-09 DIAGNOSIS — E119 Type 2 diabetes mellitus without complications: Secondary | ICD-10-CM | POA: Insufficient documentation

## 2017-07-09 DIAGNOSIS — I25119 Atherosclerotic heart disease of native coronary artery with unspecified angina pectoris: Principal | ICD-10-CM | POA: Diagnosis present

## 2017-07-09 DIAGNOSIS — E7800 Pure hypercholesterolemia, unspecified: Secondary | ICD-10-CM | POA: Diagnosis present

## 2017-07-09 HISTORY — DX: Tobacco use: Z72.0

## 2017-07-09 HISTORY — DX: Angina pectoris, unspecified: I20.9

## 2017-07-09 LAB — CBC
HEMATOCRIT: 40.9 % (ref 39.0–52.0)
HEMOGLOBIN: 13.9 g/dL (ref 13.0–17.0)
MCH: 27.1 pg (ref 26.0–34.0)
MCHC: 34 g/dL (ref 30.0–36.0)
MCV: 79.7 fL (ref 78.0–100.0)
Platelets: 215 10*3/uL (ref 150–400)
RBC: 5.13 MIL/uL (ref 4.22–5.81)
RDW: 13.4 % (ref 11.5–15.5)
WBC: 8.8 10*3/uL (ref 4.0–10.5)

## 2017-07-09 LAB — BASIC METABOLIC PANEL
Anion gap: 12 (ref 5–15)
BUN: 11 mg/dL (ref 6–20)
CO2: 19 mmol/L — ABNORMAL LOW (ref 22–32)
CREATININE: 1.12 mg/dL (ref 0.61–1.24)
Calcium: 8.9 mg/dL (ref 8.9–10.3)
Chloride: 105 mmol/L (ref 101–111)
GFR calc Af Amer: >60 mL/min (ref >60)
GLUCOSE: 175 mg/dL — AB (ref 65–99)
POTASSIUM: 3.6 mmol/L (ref 3.5–5.1)
Sodium: 136 mmol/L (ref 135–145)

## 2017-07-09 LAB — CBG MONITORING, ED: Glucose-Capillary: 109 mg/dL — ABNORMAL HIGH (ref 65–99)

## 2017-07-09 LAB — GLUCOSE, CAPILLARY
GLUCOSE-CAPILLARY: 244 mg/dL — AB (ref 65–99)
Glucose-Capillary: 126 mg/dL — ABNORMAL HIGH (ref 65–99)
Glucose-Capillary: 130 mg/dL — ABNORMAL HIGH (ref 65–99)

## 2017-07-09 LAB — I-STAT TROPONIN, ED: Troponin i, poc: 0 ng/mL (ref 0.00–0.08)

## 2017-07-09 LAB — TROPONIN I
Troponin I: <0.03 ng/mL (ref <0.03)
Troponin I: <0.03 ng/mL (ref <0.03)

## 2017-07-09 LAB — HEMOGLOBIN A1C
HEMOGLOBIN A1C: 10 % — AB (ref 4.8–5.6)
Mean Plasma Glucose: 240.3 mg/dL

## 2017-07-09 MED ORDER — SERTRALINE HCL 50 MG PO TABS
50.0000 mg | ORAL_TABLET | Freq: Every day | ORAL | Status: DC
  Administered 2017-07-09 – 2017-07-11 (×3): 50 mg via ORAL
  Filled 2017-07-09 (×3): qty 1

## 2017-07-09 MED ORDER — ONDANSETRON HCL 4 MG/2ML IJ SOLN: 4.0000 mg | Freq: Three times a day (TID) | INTRAMUSCULAR | Status: DC | PRN

## 2017-07-09 MED ORDER — NITROGLYCERIN 0.4 MG SL SUBL: 0.4000 mg | SUBLINGUAL_TABLET | SUBLINGUAL | Status: DC | PRN

## 2017-07-09 MED ORDER — HYDROCODONE-ACETAMINOPHEN 5-325 MG PO TABS: 1.0000 | ORAL_TABLET | Freq: Four times a day (QID) | ORAL | Status: DC | PRN

## 2017-07-09 MED ORDER — ATORVASTATIN CALCIUM 40 MG PO TABS
40.0000 mg | ORAL_TABLET | Freq: Every day | ORAL | Status: DC
  Administered 2017-07-09 – 2017-07-11 (×3): 40 mg via ORAL
  Filled 2017-07-09 (×3): qty 1

## 2017-07-09 MED ORDER — ZOLPIDEM TARTRATE 5 MG PO TABS: 5.0000 mg | ORAL_TABLET | Freq: Every evening | ORAL | Status: DC | PRN

## 2017-07-09 MED ORDER — LIDOCAINE 5 % EX PTCH
1.0000 | MEDICATED_PATCH | Freq: Every day | CUTANEOUS | Status: DC
  Filled 2017-07-09 (×3): qty 1

## 2017-07-09 MED ORDER — INSULIN ASPART 100 UNIT/ML ~~LOC~~ SOLN
0.0000 [IU] | Freq: Three times a day (TID) | SUBCUTANEOUS | Status: DC
  Administered 2017-07-09 (×2): 1 [IU] via SUBCUTANEOUS
  Administered 2017-07-10: 7 [IU] via SUBCUTANEOUS
  Administered 2017-07-10: 5 [IU] via SUBCUTANEOUS

## 2017-07-09 MED ORDER — SODIUM CHLORIDE 0.9 % IV SOLN
INTRAVENOUS | Status: DC
  Administered 2017-07-09 (×2): via INTRAVENOUS

## 2017-07-09 MED ORDER — GI COCKTAIL ~~LOC~~: 30.0000 mL | Freq: Four times a day (QID) | ORAL | Status: DC | PRN

## 2017-07-09 MED ORDER — ADULT MULTIVITAMIN W/MINERALS CH
1.0000 | ORAL_TABLET | Freq: Every day | ORAL | Status: DC
  Administered 2017-07-09 – 2017-07-11 (×3): 1 via ORAL
  Filled 2017-07-09 (×3): qty 1

## 2017-07-09 MED ORDER — ENOXAPARIN SODIUM 40 MG/0.4ML ~~LOC~~ SOLN
40.0000 mg | SUBCUTANEOUS | Status: DC
  Administered 2017-07-09 – 2017-07-10 (×2): 40 mg via SUBCUTANEOUS
  Filled 2017-07-09 (×2): qty 0.4

## 2017-07-09 MED ORDER — ACETAMINOPHEN 325 MG PO TABS
650.0000 mg | ORAL_TABLET | ORAL | Status: DC | PRN
  Administered 2017-07-09: 650 mg via ORAL
  Filled 2017-07-09: qty 2

## 2017-07-09 MED ORDER — MORPHINE SULFATE (PF) 4 MG/ML IV SOLN: 2.0000 mg | INTRAVENOUS | Status: DC | PRN

## 2017-07-09 MED ORDER — HYDROXYZINE HCL 25 MG PO TABS: 25.0000 mg | ORAL_TABLET | Freq: Three times a day (TID) | ORAL | Status: DC | PRN

## 2017-07-09 MED ORDER — ASPIRIN 81 MG PO CHEW: 162.0000 mg | CHEWABLE_TABLET | Freq: Once | ORAL | Status: DC

## 2017-07-09 MED ORDER — NICOTINE 14 MG/24HR TD PT24
14.0000 mg | MEDICATED_PATCH | Freq: Every day | TRANSDERMAL | Status: DC
  Filled 2017-07-09 (×3): qty 1

## 2017-07-09 MED ORDER — METOPROLOL TARTRATE 25 MG PO TABS
25.0000 mg | ORAL_TABLET | Freq: Two times a day (BID) | ORAL | Status: DC
  Administered 2017-07-09 – 2017-07-11 (×5): 25 mg via ORAL
  Filled 2017-07-09 (×5): qty 1

## 2017-07-09 MED ORDER — GABAPENTIN 300 MG PO CAPS
300.0000 mg | ORAL_CAPSULE | Freq: Two times a day (BID) | ORAL | Status: DC
  Administered 2017-07-09 – 2017-07-11 (×5): 300 mg via ORAL
  Filled 2017-07-09 (×5): qty 1

## 2017-07-09 MED ORDER — INSULIN ASPART PROT & ASPART (70-30 MIX) 100 UNIT/ML ~~LOC~~ SUSP
30.0000 [IU] | Freq: Two times a day (BID) | SUBCUTANEOUS | Status: DC
  Administered 2017-07-09 – 2017-07-10 (×2): 30 [IU] via SUBCUTANEOUS
  Filled 2017-07-09: qty 10

## 2017-07-09 MED ORDER — TRAZODONE HCL 100 MG PO TABS
100.0000 mg | ORAL_TABLET | Freq: Every evening | ORAL | Status: DC | PRN
Start: 2017-07-09 — End: 2017-07-11
  Administered 2017-07-10: 100 mg via ORAL
  Filled 2017-07-09: qty 1

## 2017-07-09 MED ORDER — ASPIRIN EC 325 MG PO TBEC
325.0000 mg | DELAYED_RELEASE_TABLET | Freq: Every day | ORAL | Status: DC
  Administered 2017-07-10: 325 mg via ORAL
  Filled 2017-07-09 (×2): qty 1

## 2017-07-09 MED ORDER — HYDROCHLOROTHIAZIDE 25 MG PO TABS
25.0000 mg | ORAL_TABLET | Freq: Every day | ORAL | Status: DC
  Administered 2017-07-09 – 2017-07-10 (×2): 25 mg via ORAL
  Filled 2017-07-09 (×2): qty 1

## 2017-07-09 MED ORDER — RISPERIDONE 1 MG PO TABS
1.0000 mg | ORAL_TABLET | Freq: Two times a day (BID) | ORAL | Status: DC
  Administered 2017-07-09 – 2017-07-10 (×3): 2 mg via ORAL
  Administered 2017-07-10: 1 mg via ORAL
  Administered 2017-07-11: 2 mg via ORAL
  Filled 2017-07-09 (×6): qty 2

## 2017-07-09 NOTE — ED Provider Notes (Signed)
[facility] EMERGENCY DEPARTMENT Provider Note   CSN: [id number] Arrival date & time: 07/09/17  0113     History   Chief Complaint Chief Complaint  Patient presents with  . Chest Pain    HPI Charles Orr is a 56 y.o. male.  HPI 56 y/o with hx of DM, CAD, psychiatric conditions comes in with cc of chest pain. Pt reports that his chest pain started about an hour prior to the ER arrival when he started having chest pain. Chest pain is described as sharp pain with radiation to the L side. Pt also has associated dib and diophoresis. PT had stents placed in 2012 and he reports that his pain today was similar to the pain in 2012 when he had MI. Pt has a 35 pack year smoking hx. Pt denies active drug use, but admits to  Intermittent cocaine use.  Past Medical History:  Diagnosis Date  . Anxiety   . Arthritis    "back" (06/12/2017)  . Bipolar disorder (HCC)   . CAP (community acquired pneumonia) 06/10/2017  . Chronic lower back pain   . Degenerative disc disease, lumbar   . Depression   . Fibromyalgia   . GERD (gastroesophageal reflux disease)   . High cholesterol   . Hypertension   . Myocardial infarction (HCC) 2002  . Pneumonia 1989  . Schizophrenia (HCC)   . Type II diabetes mellitus [facility])     Patient Active Problem List   Diagnosis Date Noted  . Chest pain 07/09/2017  . Schizoaffective disorder, bipolar type (HCC) 06/16/2017  . Depression 06/15/2017  . Other schizophrenia (HCC)   . Pneumonia 06/11/2017  . Community acquired pneumonia of right lower lobe of lung (HCC)   . Hyperglycemia   . Localized edema   . Shortness of breath 06/10/2017    Past Surgical History:  Procedure Laterality Date  . CARDIAC CATHETERIZATION  12/2006   [name] 01/18/2011  . INGUINAL HERNIA REPAIR Right   . PARATHYROIDECTOMY    . PERICARDIAL TAP  2000   ?; in [place]/notes 01/18/2011       Home Medications    Prior to Admission medications   Medication Sig  Start Date End Date Taking? Authorizing Provider  acetaminophen (TYLENOL) 500 MG tablet Take 1,000 mg by mouth every 6 (six) hours as needed for moderate pain.   Yes [provider]  aspirin 81 MG EC tablet Take 1 tablet (81 mg total) by mouth daily. For heart health 06/26/17  Yes Armandina Stammer I, NP  atorvastatin (LIPITOR) 40 MG tablet Take 1 tablet (40 mg total) by mouth daily. For high Cholesterol 06/26/17  Yes Armandina Stammer I, NP  gabapentin (NEURONTIN) 300 MG capsule Take 1 capsule (300 mg total) by mouth 2 (two) times daily. For agitation 06/26/17  Yes Nwoko, Nicole Kindred I, NP  hydrochlorothiazide (HYDRODIURIL) 25 MG tablet Take 1 tablet (25 mg total) by mouth daily. For high blood pressure 06/26/17  Yes Nwoko, Nicole Kindred I, NP  hydrOXYzine (ATARAX/VISTARIL) 25 MG tablet Take 1 tablet (25 mg total) by mouth 3 (three) times daily as needed for anxiety. 06/26/17  Yes Armandina Stammer I, NP  insulin aspart protamine- aspart (NOVOLOG MIX 70/30) (70-30) 100 UNIT/ML injection Inject 0.6 mLs (60 Units total) into the skin daily with breakfast. For diabetes management Patient taking differently: Inject 45-60 Units into the skin 2 (two) times daily with a meal. 60 units in the morning and 45 units at dinner time 06/27/17  Yes Nwoko,  Nelda Marseille, NP  metoprolol tartrate (LOPRESSOR) 25 MG tablet Take 1 tablet (25 mg total) by mouth 2 (two) times daily. For high blood pressure 06/26/17  Yes Nwoko, Nicole Kindred I, NP  Multiple Vitamin (MULTIVITAMIN WITH MINERALS) TABS tablet Take 1 tablet by mouth daily. For Vitamin supplement 06/26/17  Yes Nwoko, Nicole Kindred I, NP  risperiDONE (RISPERDAL) 1 MG tablet Take 1 tablet (1 mg) by mouth in the mornings & 2 tablets (2 mg) at bedtime: For mood control Patient taking differently: Take 1-2 mg by mouth 2 (two) times daily. Take 1 tablet (1 mg) by mouth in the mornings & 2 tablets (2 mg) at bedtime: For mood control 06/26/17  Yes Nwoko, Nicole Kindred I, NP  sertraline (ZOLOFT) 50 MG tablet Take 1 tablet  (50 mg total) by mouth daily. For depression 06/27/17  Yes Armandina Stammer I, NP  traZODone (DESYREL) 100 MG tablet Take 1 tablet (100 mg total) by mouth at bedtime as needed for sleep. 06/26/17  Yes Armandina Stammer I, NP  HYDROcodone-acetaminophen (NORCO/VICODIN) 5-325 MG tablet Take 1-2 tablets by mouth every 6 hours as needed for pain. 07/07/17   Pisciotta, Joni Reining, PA-C  insulin aspart protamine- aspart (NOVOLOG MIX 70/30) (70-30) 100 UNIT/ML injection Inject 0.45 mLs (45 Units total) into the skin daily with supper. For diabetes management Patient not taking: Reported on 07/07/2017 06/26/17   Armandina Stammer I, NP  lidocaine (LIDODERM) 5 % Place 1 patch onto the skin daily. Remove & Discard patch within 12 hours or as directed by MD: For pain management Patient not taking: Reported on 07/07/2017 06/27/17   Armandina Stammer I, NP  nicotine (NICODERM CQ - DOSED IN MG/24 HOURS) 14 mg/24hr patch Place 1 patch (14 mg total) onto the skin daily. (May purchase over the counter): For smoking cessation Patient not taking: Reported on 07/07/2017 06/27/17   Armandina Stammer I, NP    Family History No family history on file.  Social History Social History   Tobacco Use  . Smoking status: Current Every Day Smoker    Packs/day: 0.50    Years: 38.00    Pack years: 19.00    Types: Cigarettes  . Smokeless tobacco: Current User    Types: Chew  Substance Use Topics  . Alcohol use: Yes    Comment: 06/12/2017 "stopped ~ 1 yr ago"   . Drug use: Yes    Types: Cocaine    Comment: 06/12/2017 "stopped using cocaine ~ 1 yr ago; used marijuana when I was younger" last cocaine use 9 mths ago per pt on 06/15/17     Allergies   Patient has no known allergies.   Review of Systems Review of Systems  Constitutional: Positive for activity change.  Respiratory: Positive for shortness of breath.   Cardiovascular: Positive for chest pain.  Allergic/Immunologic: Negative for immunocompromised state.  All other systems reviewed  and are negative.    Physical Exam Updated Vital Signs BP 125/76 (BP Location: Left Arm)   Pulse 92   Temp 98.6 F (37 C) (Oral)   Resp 20   Ht 6\' 2"  (1.88 m)   Wt 117.9 kg (260 lb)   SpO2 96%   BMI 33.38 kg/m   Physical Exam  Constitutional: He is oriented to person, place, and time. He appears well-developed.  HENT:  Head: Atraumatic.  Neck: Neck supple.  Cardiovascular: Normal rate, intact distal pulses and normal pulses.  Pulmonary/Chest: Effort normal.  Musculoskeletal:       Right lower leg: He exhibits no  tenderness and no edema.       Left lower leg: He exhibits no tenderness and no edema.  Neurological: He is alert and oriented to person, place, and time.  Skin: Skin is warm.  Nursing note and vitals reviewed.    ED Treatments / Results  Labs (all labs ordered are listed, but only abnormal results are displayed) Labs Reviewed  BASIC METABOLIC PANEL - Abnormal; Notable for the following components:      Result Value   CO2 19 (*)    Glucose, Bld 175 (*)    All other components within normal limits  CBC  TROPONIN I  TROPONIN I  TROPONIN I  RAPID URINE DRUG SCREEN, HOSP PERFORMED  I-STAT TROPONIN, ED    EKG  EKG Interpretation  Date/Time:  Monday July 09 2017 01:19:52 EST Ventricular Rate:  91 PR Interval:    QRS Duration: 77 QT Interval:  360 QTC Calculation: 443 R Axis:   49 Text Interpretation:  Sinus rhythm Probable left atrial enlargement Borderline T abnormalities, inferior leads No acute changes No significant change since last tracing Confirmed by Derwood Kaplananavati, Jarid Sasso ([id number]) on 07/09/2017 2:08:29 AM       Radiology Dg Chest 2 View  Result Date: 07/09/2017 CLINICAL DATA:  Lethargy and confusion. Central chest pain. Dizziness. EXAM: CHEST  2 VIEW COMPARISON:  06/10/2017 FINDINGS: Shallow inspiration with linear atelectasis in the lung bases, progressing since previous study. Mild cardiac enlargement. No vascular congestion. No edema. No  blunting of costophrenic angles. No pneumothorax. Mediastinal contours appear intact. IMPRESSION: Shallow inspiration with linear atelectasis in the lung bases, progressing since previous study. Electronically Signed   By: Burman NievesWilliam  Stevens M.D.   On: 07/09/2017 02:44   Ct Abdomen Pelvis W Contrast  Result Date: 07/07/2017 CLINICAL DATA:  56 year old male with abdominal pain. EXAM: CT ABDOMEN AND PELVIS WITH CONTRAST TECHNIQUE: Multidetector CT imaging of the abdomen and pelvis was performed using the standard protocol following bolus administration of intravenous contrast. CONTRAST:  <See Chart> ISOVUE-300 IOPAMIDOL (ISOVUE-300) INJECTION 61% COMPARISON:  None. FINDINGS: Lower chest: There is mild eventration of the right hemidiaphragm. Right lung base linear atelectatic changes/scarring. Pneumonia is not entirely excluded. Clinical correlation is recommended. There is mild bilateral lower lobe bronchiectatic changes. There is no intra-abdominal free air or free fluid. Hepatobiliary: There is apparent mild fatty infiltration of the liver. No intrahepatic biliary ductal dilatation. The gallbladder is unremarkable. Pancreas: Unremarkable. No pancreatic ductal dilatation or surrounding inflammatory changes. Spleen: Normal in size without focal abnormality. Adrenals/Urinary Tract: The adrenal glands are unremarkable. There is a 2.5 cm left renal inferior pole cyst. There is no hydronephrosis on either side. There is symmetric enhancement and excretion of contrast by both kidneys. The visualized ureters and urinary bladder appear unremarkable. Stomach/Bowel: There is no evidence of bowel obstruction or active inflammation. Normal appendix. Vascular/Lymphatic: Mild aortoiliac atherosclerotic disease. The IVC is grossly unremarkable. No portal venous gas. There is no adenopathy. Reproductive: The prostate and seminal vesicles are grossly unremarkable. Other: Small fat containing umbilical hernia. No fluid collection or  inflammatory changes. Musculoskeletal: No acute or significant osseous findings. IMPRESSION: 1. No acute intra-abdominal or pelvic pathology. No bowel obstruction or active inflammation. Normal appendix. 2. Mild fatty liver. 3. Mild eventration of the right hemidiaphragm with right lung base linear atelectasis/scarring. Mild bilateral lower lobe bronchiectatic changes. Electronically Signed   By: Elgie CollardArash  Radparvar M.D.   On: 07/07/2017 23:08    Procedures Procedures (including critical care time)  Medications Ordered in  ED Medications  nitroGLYCERIN (NITROSTAT) SL tablet 0.4 mg (not administered)  0.9 %  sodium chloride infusion (not administered)  morphine 4 MG/ML injection 2 mg (not administered)  insulin aspart (novoLOG) injection 0-9 Units (not administered)  lidocaine (LIDODERM) 5 % 1 patch (not administered)  nicotine (NICODERM CQ - dosed in mg/24 hours) patch 14 mg (not administered)  aspirin EC tablet 325 mg (not administered)  atorvastatin (LIPITOR) tablet 40 mg (not administered)  gabapentin (NEURONTIN) capsule 300 mg (not administered)  hydrochlorothiazide (HYDRODIURIL) tablet 25 mg (not administered)  HYDROcodone-acetaminophen (NORCO/VICODIN) 5-325 MG per tablet 1 tablet (not administered)  hydrOXYzine (ATARAX/VISTARIL) tablet 25 mg (not administered)  insulin aspart protamine- aspart (NOVOLOG MIX 70/30) injection 30 Units (not administered)  metoprolol tartrate (LOPRESSOR) tablet 25 mg (not administered)  multivitamin with minerals tablet 1 tablet (not administered)  risperiDONE (RISPERDAL) tablet 1-2 mg (not administered)  sertraline (ZOLOFT) tablet 50 mg (not administered)  traZODone (DESYREL) tablet 100 mg (not administered)  ondansetron (ZOFRAN) injection 4 mg (not administered)  zolpidem (AMBIEN) tablet 5 mg (not administered)     Initial Impression / Assessment and Plan / ED Course  I have reviewed the triage vital signs and the nursing notes.  Pertinent labs &  imaging results that were available during my care of the patient were reviewed by me and considered in my medical decision making (see chart for details).     Differential diagnosis includes: ACS syndrome Aortic dissection CHF exacerbation Myocarditis Pericarditis PE Pneumothorax Musculoskeletal pain PUD / Gastritis / Esophagitis Esophageal spasm  Patient comes in with chief complaint of chest pain.  Patient has history of coronary artery disease status post PCI in 2012.  Patient has history of diabetes, hypertension, hyperlipidemia, cocaine use and smoking.  He reports that his pain was typical of his ACS pain earlier today.  Patient's pain only lasted for a few minutes and has not returned.  EKG is reassuring. Initial troponin is negative. Admit to medicine for ACS rule out.  Final Clinical Impressions(s) / ED Diagnoses   Final diagnoses:  Angina pectoris [facility])    ED Discharge Orders    None       Derwood Kaplan, MD 07/09/17 [id number]

## 2017-07-09 NOTE — H&P (Signed)
History and Physical    Charles PortaKarl J Kluger YQI:[id number] DOB: 04-04-1961 DOA: 07/09/2017  PCP: Patient, No Pcp Per Patient coming from: home  Chief Complaint: chest pain  HPI: Charles Orr is a 56 y.o. male with medical history significant for diabetes, obesity, tobacco use, CAD status post stents 2012, schizophrenia, chronic back pain presents to the emergency Department chief complaint chest pain. Patient being admitted for rule out  Information is obtained from the chart and the patient. Patient reports he was walking outside he developed sudden onset left anterior chest pain. He describes it as constant sharp radiating to his left shoulder. Associated symptoms include shortness of breath diaphoresis nausea and one episode of emesis. He denies any coffee ground emesis. He states he stopped walking for a moment and the chest pain improved but is unable to articulate duration of chest pain or timeframe between onset and come into the emergency department. He denies headache dizziness syncope or near-syncope. He denies abdominal pain dysuria hematuria frequency or urgency. He denies any lower extremity edema cough fever chills recent travel or sick contacts. He states he had a stress test this year in [place] and everything was "okay" he does indicate his antihypertensive medications were changed at that time.   ED Course: In emergency department he's afebrile hemodynamically stable and not hypoxic. At the time of admission he is chest pain-free.  Review of Systems: As per HPI otherwise all other systems reviewed and are negative.   Ambulatory Status: Ambulates with a steady gait no recent falls  Past Medical History:  Diagnosis Date  . Anxiety   . Arthritis    "back" (06/12/2017)  . Bipolar disorder (HCC)   . CAP (community acquired pneumonia) 06/10/2017  . Chronic lower back pain   . Degenerative disc disease, lumbar   . Depression   . Fibromyalgia   . GERD (gastroesophageal reflux  disease)   . High cholesterol   . Hypertension   . Myocardial infarction (HCC) 2002  . Pneumonia 1989  . Schizophrenia (HCC)   . Type II diabetes mellitus (HCC)     Past Surgical History:  Procedure Laterality Date  . CARDIAC CATHETERIZATION  12/2006   [name]/notes 01/18/2011  . INGUINAL HERNIA REPAIR Right   . PARATHYROIDECTOMY    . PERICARDIAL TAP  2000   ?; in [place]/notes 01/18/2011    Social History   Socioeconomic History  . Marital status: Divorced    Spouse name: Not on file  . Number of children: Not on file  . Years of education: Not on file  . Highest education level: Not on file  Social Needs  . Financial resource strain: Not on file  . Food insecurity - worry: Not on file  . Food insecurity - inability: Not on file  . Transportation needs - medical: Not on file  . Transportation needs - non-medical: Not on file  Occupational History  . Not on file  Tobacco Use  . Smoking status: Current Every Day Smoker    Packs/day: 0.50    Years: 38.00    Pack years: 19.00    Types: Cigarettes  . Smokeless tobacco: Current User    Types: Chew  Substance and Sexual Activity  . Alcohol use: Yes    Comment: 06/12/2017 "stopped ~ 1 yr ago"   . Drug use: Yes    Types: Cocaine    Comment: 06/12/2017 "stopped using cocaine ~ 1 yr ago; used marijuana when I was younger" last cocaine use 9  mths ago per pt on 06/15/17  . Sexual activity: Not Currently  Other Topics Concern  . Not on file  Social History Narrative  . Not on file    No Known Allergies  No family history on file.  Prior to Admission medications   Medication Sig Start Date End Date Taking? Authorizing Provider  acetaminophen (TYLENOL) 500 MG tablet Take 1,000 mg by mouth every 6 (six) hours as needed for moderate pain.   Yes [provider]  aspirin 81 MG EC tablet Take 1 tablet (81 mg total) by mouth daily. For heart health 06/26/17  Yes Armandina Stammer I, NP  atorvastatin (LIPITOR) 40 MG tablet Take 1  tablet (40 mg total) by mouth daily. For high Cholesterol 06/26/17  Yes Armandina Stammer I, NP  gabapentin (NEURONTIN) 300 MG capsule Take 1 capsule (300 mg total) by mouth 2 (two) times daily. For agitation 06/26/17  Yes Nwoko, Nicole Kindred I, NP  hydrochlorothiazide (HYDRODIURIL) 25 MG tablet Take 1 tablet (25 mg total) by mouth daily. For high blood pressure 06/26/17  Yes Nwoko, Nicole Kindred I, NP  hydrOXYzine (ATARAX/VISTARIL) 25 MG tablet Take 1 tablet (25 mg total) by mouth 3 (three) times daily as needed for anxiety. 06/26/17  Yes Armandina Stammer I, NP  insulin aspart protamine- aspart (NOVOLOG MIX 70/30) (70-30) 100 UNIT/ML injection Inject 0.6 mLs (60 Units total) into the skin daily with breakfast. For diabetes management Patient taking differently: Inject 45-60 Units into the skin 2 (two) times daily with a meal. 60 units in the morning and 45 units at dinner time 06/27/17  Yes Nwoko, Nicole Kindred I, NP  metoprolol tartrate (LOPRESSOR) 25 MG tablet Take 1 tablet (25 mg total) by mouth 2 (two) times daily. For high blood pressure 06/26/17  Yes Nwoko, Nicole Kindred I, NP  Multiple Vitamin (MULTIVITAMIN WITH MINERALS) TABS tablet Take 1 tablet by mouth daily. For Vitamin supplement 06/26/17  Yes Nwoko, Nicole Kindred I, NP  risperiDONE (RISPERDAL) 1 MG tablet Take 1 tablet (1 mg) by mouth in the mornings & 2 tablets (2 mg) at bedtime: For mood control Patient taking differently: Take 1-2 mg by mouth 2 (two) times daily. Take 1 tablet (1 mg) by mouth in the mornings & 2 tablets (2 mg) at bedtime: For mood control 06/26/17  Yes Nwoko, Nicole Kindred I, NP  sertraline (ZOLOFT) 50 MG tablet Take 1 tablet (50 mg total) by mouth daily. For depression 06/27/17  Yes Armandina Stammer I, NP  traZODone (DESYREL) 100 MG tablet Take 1 tablet (100 mg total) by mouth at bedtime as needed for sleep. 06/26/17  Yes Armandina Stammer I, NP  HYDROcodone-acetaminophen (NORCO/VICODIN) 5-325 MG tablet Take 1-2 tablets by mouth every 6 hours as needed for pain. 07/07/17   Pisciotta,  Joni Reining, PA-C  insulin aspart protamine- aspart (NOVOLOG MIX 70/30) (70-30) 100 UNIT/ML injection Inject 0.45 mLs (45 Units total) into the skin daily with supper. For diabetes management Patient not taking: Reported on 07/07/2017 06/26/17   Armandina Stammer I, NP  lidocaine (LIDODERM) 5 % Place 1 patch onto the skin daily. Remove & Discard patch within 12 hours or as directed by MD: For pain management Patient not taking: Reported on 07/07/2017 06/27/17   Armandina Stammer I, NP  nicotine (NICODERM CQ - DOSED IN MG/24 HOURS) 14 mg/24hr patch Place 1 patch (14 mg total) onto the skin daily. (May purchase over the counter): For smoking cessation Patient not taking: Reported on 07/07/2017 06/27/17   Sanjuana Kava, NP    Physical  Exam: Vitals:   07/09/17 0630 07/09/17 0700 07/09/17 0704 07/09/17 0730  BP: 122/68 (!) 111/54  111/75  Pulse: 86   88  Resp: (!) 27     Temp:      TempSrc:      SpO2: 97%  99% 96%  Weight:      Height:         General:  Appears calm and comfortable in no acute distress Eyes:  PERRL, EOMI, normal lids, iris ENT:  grossly normal hearing, lips & tongue, mucous membranes of his mouth are pink only slightly dry Neck:  no LAD, masses or thyromegaly Cardiovascular:  RRR, no m/r/g. No LE edema.  Respiratory:  CTA bilaterally, no w/r/r. Normal respiratory effort. Abdomen:  soft, ntnd, obese positive bowel sounds no guarding or rebounding Skin:  no rash or induration seen on limited exam Musculoskeletal:  grossly normal tone BUE/BLE, good ROM, no bony abnormality Psychiatric:  grossly normal mood and affect, speech fluent and appropriate, AOx3 Neurologic:  CN 2-12 grossly intact, moves all extremities in coordinated fashion, sensation intact. Speech clear facial symmetry. Minimal participation in exam/interview  Labs on Admission: I have personally reviewed following labs and imaging studies  CBC: Recent Labs  Lab 07/07/17 0444 07/07/17 1716 07/09/17 0155  WBC 9.4 7.8 8.8   HGB 15.0 14.8 13.9  HCT 43.9 43.5 40.9  MCV 80.4 80.6 79.7  PLT 243 233 215   Basic Metabolic Panel: Recent Labs  Lab 07/07/17 0444 07/07/17 1716 07/07/17 1800 07/09/17 0155  NA 137 133*  --  136  K 4.1 4.5  --  3.6  CL 103 101  --  105  CO2 23 22  --  19*  GLUCOSE 257* 401*  --  175*  BUN 15 17  --  11  CREATININE 1.10 1.21  --  1.12  CALCIUM 9.4 9.3  --  8.9  MG  --   --  2.3  --    GFR: Estimated Creatinine Clearance: 101.7 mL/min (by C-G formula based on SCr of 1.12 mg/dL). Liver Function Tests: Recent Labs  Lab 07/07/17 0444 07/07/17 1716  AST 21 20  ALT 26 26  ALKPHOS 73 87  BILITOT 0.8 0.7  PROT 7.8 7.5  ALBUMIN 4.2 4.0   Recent Labs  Lab 07/07/17 1716  LIPASE 27   No results for input(s): AMMONIA in the last 168 hours. Coagulation Profile: Recent Labs  Lab 07/07/17 1800  INR 1.10   Cardiac Enzymes: Recent Labs  Lab 07/09/17 0155  TROPONINI <0.03   BNP (last 3 results) No results for input(s): PROBNP in the last 8760 hours. HbA1C: No results for input(s): HGBA1C in the last 72 hours. CBG: Recent Labs  Lab 07/07/17 0919 07/07/17 1321 07/07/17 2021 07/09/17 0730  GLUCAP 221* 327* 275* 109*   Lipid Profile: No results for input(s): CHOL, HDL, LDLCALC, TRIG, CHOLHDL, LDLDIRECT in the last 72 hours. Thyroid Function Tests: No results for input(s): TSH, T4TOTAL, FREET4, T3FREE, THYROIDAB in the last 72 hours. Anemia Panel: No results for input(s): VITAMINB12, FOLATE, FERRITIN, TIBC, IRON, RETICCTPCT in the last 72 hours. Urine analysis:    Component Value Date/Time   COLORURINE YELLOW 07/07/2017 1716   APPEARANCEUR CLEAR 07/07/2017 1716   LABSPEC 1.024 07/07/2017 1716   PHURINE 5.0 07/07/2017 1716   GLUCOSEU >=500 (A) 07/07/2017 1716   HGBUR NEGATIVE 07/07/2017 1716   BILIRUBINUR NEGATIVE 07/07/2017 1716   KETONESUR NEGATIVE 07/07/2017 1716   PROTEINUR NEGATIVE 07/07/2017 1716  NITRITE NEGATIVE 07/07/2017 1716   LEUKOCYTESUR  NEGATIVE 07/07/2017 1716    Creatinine Clearance: Estimated Creatinine Clearance: 101.7 mL/min (by C-G formula based on SCr of 1.12 mg/dL).  Sepsis Labs: @LABRCNTIP (procalcitonin:4,lacticidven:4) )No results found for this or any previous visit (from the past 240 hour(s)).   Radiological Exams on Admission: Dg Chest 2 View  Result Date: 07/09/2017 CLINICAL DATA:  Lethargy and confusion. Central chest pain. Dizziness. EXAM: CHEST  2 VIEW COMPARISON:  06/10/2017 FINDINGS: Shallow inspiration with linear atelectasis in the lung bases, progressing since previous study. Mild cardiac enlargement. No vascular congestion. No edema. No blunting of costophrenic angles. No pneumothorax. Mediastinal contours appear intact. IMPRESSION: Shallow inspiration with linear atelectasis in the lung bases, progressing since previous study. Electronically Signed   By: Burman Nieves M.D.   On: 07/09/2017 02:44   Ct Abdomen Pelvis W Contrast  Result Date: 07/07/2017 CLINICAL DATA:  56 year old male with abdominal pain. EXAM: CT ABDOMEN AND PELVIS WITH CONTRAST TECHNIQUE: Multidetector CT imaging of the abdomen and pelvis was performed using the standard protocol following bolus administration of intravenous contrast. CONTRAST:  <See Chart> ISOVUE-300 IOPAMIDOL (ISOVUE-300) INJECTION 61% COMPARISON:  None. FINDINGS: Lower chest: There is mild eventration of the right hemidiaphragm. Right lung base linear atelectatic changes/scarring. Pneumonia is not entirely excluded. Clinical correlation is recommended. There is mild bilateral lower lobe bronchiectatic changes. There is no intra-abdominal free air or free fluid. Hepatobiliary: There is apparent mild fatty infiltration of the liver. No intrahepatic biliary ductal dilatation. The gallbladder is unremarkable. Pancreas: Unremarkable. No pancreatic ductal dilatation or surrounding inflammatory changes. Spleen: Normal in size without focal abnormality. Adrenals/Urinary Tract:  The adrenal glands are unremarkable. There is a 2.5 cm left renal inferior pole cyst. There is no hydronephrosis on either side. There is symmetric enhancement and excretion of contrast by both kidneys. The visualized ureters and urinary bladder appear unremarkable. Stomach/Bowel: There is no evidence of bowel obstruction or active inflammation. Normal appendix. Vascular/Lymphatic: Mild aortoiliac atherosclerotic disease. The IVC is grossly unremarkable. No portal venous gas. There is no adenopathy. Reproductive: The prostate and seminal vesicles are grossly unremarkable. Other: Small fat containing umbilical hernia. No fluid collection or inflammatory changes. Musculoskeletal: No acute or significant osseous findings. IMPRESSION: 1. No acute intra-abdominal or pelvic pathology. No bowel obstruction or active inflammation. Normal appendix. 2. Mild fatty liver. 3. Mild eventration of the right hemidiaphragm with right lung base linear atelectasis/scarring. Mild bilateral lower lobe bronchiectatic changes. Electronically Signed   By: Elgie Collard M.D.   On: 07/07/2017 23:08    EKG: Independently reviewed. Sinus rhythm Borderline T wave abnormalities Minimal ST elevation, anterior leads  Assessment/Plan Principal Problem:   Chest pain Active Problems:   Hyperglycemia   Schizoaffective disorder, bipolar type (HCC)   Tobacco abuse   Hypertension   #1. Chest pain. Some typical and atypical features. Heart score 4. Patient with a history of CAD status post stents in 2012. Patient reports a stress test this year in [place] with "okay" results. Initial troponin negative. EKG as noted above. Chest x-ray without infiltrate but progressing atelectasis. CT of the abdomen done 2 days ago reveals no acute intra-abdominal or pelvic pathology with mild eventration of the right hemidiaphragm with right lung base atelectasis/scarring. He is chest pain-free on admission -Admit to telemetry -Cycle  troponin -Serial EKG -GI cocktail -Nothing by mouth for now until evaluated by cardiology -Continue aspirin and statin -Cardiology consult  #2. Diabetes/hyperglycemia. Serum glucose is 175 on admission. Home regimen includes  70/30 insulin. Reports compliance -Continue home regimen -Obtain hemoglobin A1c -Monitor  #3. Hypertension. Controlled in the emergency department. Home medications include hydrochlorothiazide -Continue home meds -Monitor  #4. Schizoaffective disorder. Stable at baseline -    DVT prophylaxis: lovenox  Code Status: full  Family Communication: none present  Disposition Plan: home  Consults called: cardmaster  Admission status: obs    Toya Smothers M MD [facility]  If 7PM-7AM, please contact night-coverage [email or web address] Password [email or web address]  07/09/2017, 7:45 AM

## 2017-07-09 NOTE — Progress Notes (Signed)
   07/09/17 1500  Clinical Encounter Type  Visited With Patient not available  Referral From Nurse  Consult/Referral To Chaplain  Spiritual Encounters  Spiritual Needs Prayer  Chaplain attempted to visit with the PT but they were in the middle of a discussion with the nurse in the room.  Chaplain will revisit

## 2017-07-09 NOTE — Progress Notes (Signed)
This is a no charge note  Pending admission per Dr.  Rhunette CroftNanavati  56 year old male with a past medical history of hypertension, diabetes mellitus, CAD, stent placement, GERD, schizophrenia, bipolar disorder, chronic back pain, fibromyalgia, tobacco abuse, cocaine abuse, pericardial tap, who presents with chest pain, dizziness and SOB. Chest pain has resolved currently.   Patient was found to have negative troponin, WBC 8.8, INR 1.10, creatinine 1.12, temperature normal, heart rate in 90s, oxygen saturation 96% on room air, chest x-ray has no infiltration. Patient is placed on telemetry bed for observation.    Lorretta HarpXilin Aletta Edmunds, MD  [facility] Pager [phone]  If 7PM-7AM, please contact night-coverage [email or web address] Password [email or web address] 07/09/2017, 3:17 AM

## 2017-07-09 NOTE — Plan of Care (Signed)
Pt is resting comfortably-denies complaints of aggitation/anxiety. Pt is independent with ADL's , gait steady. Pt voices understanding to call for assistance if needed.

## 2017-07-09 NOTE — ED Triage Notes (Signed)
BIB EMS, pt was walking on street and began having central CP, states "it feels like someone is kicking me in my chest" Also reports dizziness. VSS.

## 2017-07-09 NOTE — ED Notes (Signed)
Pt transported to xray 

## 2017-07-09 NOTE — Consult Note (Addendum)
Cardiology Consultation:   Patient ID: Charles PortaKarl J Orr; [id number]; May 12, 1961   Admit date: 07/09/2017 Date of Consult: 07/09/2017  Primary Care Provider: Patient, No Pcp Per Primary Cardiologist: New Dr. Tresa EndoKelly  Primary Electrophysiologist:  NA   Patient Profile:   Charles Orr is a 56 y.o. male with a hx of DM-2, tobacco use, obesity, schizophrenia, chronic back pain and CAD with stents placed in 2012 who is being seen today for the evaluation of chest pain at the request of Dr. Konrad DoloresMerrell.  History of Present Illness:   Charles Orr has  a hx of DM-2, tobacco use, obesity, schizophrenia, chronic back pain. And CAD with stents placed in 2012.  He had cath here in 2008 with no significant CAD and EF 45-50%.  I cannot find cath for 2012 and he said stents placed here.  No coronary stents noted on CT of chest done in Oct this year when he was Dx with PNA.   Also with no significant coronary artery calcifications.     He noted he had undergone stress test this year in [facility] and everything was ok.    Pt presented to ER early AM with chest pain, like someone kicking him in the chest.  Described as sudden constant and sharp with radiation to Lt shoulder.  He also has SOB, diaphoresis and nausea with one episode of emesis.    EKG:  The EKG was personally reviewed and demonstrates:  SR with inf ST abnormalities but similar to early Oct. Telemetry:  Telemetry was personally reviewed and demonstrates:  SR  Troponin <0.03 X2  Na 136, K+ 3.6, Glucose up to 400  Cr 1.12 LFTs normal H/H 13.9/40.9  hgb A1c 10  CXR 2 V:  Shallow inspiration with linear atelectasis in the lung bases, progressing since previous study  Currently he has some mild pain that comes and goes.  He had been having more chest pain at times.  Some SOB as well.  Has not had cardiology follow up here.  Past Medical History:  Diagnosis Date  . Anxiety   . Arthritis    "back" (06/12/2017)  . Bipolar disorder (HCC)   . CAP (community  acquired pneumonia) 06/10/2017  . Chronic lower back pain   . Degenerative disc disease, lumbar   . Depression   . Fibromyalgia   . GERD (gastroesophageal reflux disease)   . High cholesterol   . Hypertension   . Myocardial infarction (HCC) 2002  . Pneumonia 1989  . Schizophrenia (HCC)   . Type II diabetes mellitus (HCC)     Past Surgical History:  Procedure Laterality Date  . CARDIAC CATHETERIZATION  12/2006   [name]/notes 01/18/2011  . INGUINAL HERNIA REPAIR Right   . PARATHYROIDECTOMY    . PERICARDIAL TAP  2000   ?; in [place]/notes 01/18/2011     Home Medications:  Prior to Admission medications   Medication Sig Start Date End Date Taking? Authorizing Provider  acetaminophen (TYLENOL) 500 MG tablet Take 1,000 mg by mouth every 6 (six) hours as needed for moderate pain.   Yes [provider]  aspirin 81 MG EC tablet Take 1 tablet (81 mg total) by mouth daily. For heart health 06/26/17  Yes Armandina StammerNwoko, Agnes I, NP  atorvastatin (LIPITOR) 40 MG tablet Take 1 tablet (40 mg total) by mouth daily. For high Cholesterol 06/26/17  Yes Armandina StammerNwoko, Agnes I, NP  gabapentin (NEURONTIN) 300 MG capsule Take 1 capsule (300 mg total) by mouth 2 (two) times daily.  For agitation 06/26/17  Yes Nwoko, Nicole Kindred I, NP  hydrochlorothiazide (HYDRODIURIL) 25 MG tablet Take 1 tablet (25 mg total) by mouth daily. For high blood pressure 06/26/17  Yes Nwoko, Nicole Kindred I, NP  hydrOXYzine (ATARAX/VISTARIL) 25 MG tablet Take 1 tablet (25 mg total) by mouth 3 (three) times daily as needed for anxiety. 06/26/17  Yes Armandina Stammer I, NP  insulin aspart protamine- aspart (NOVOLOG MIX 70/30) (70-30) 100 UNIT/ML injection Inject 0.6 mLs (60 Units total) into the skin daily with breakfast. For diabetes management Patient taking differently: Inject 45-60 Units into the skin 2 (two) times daily with a meal. 60 units in the morning and 45 units at dinner time 06/27/17  Yes Nwoko, Nicole Kindred I, NP  metoprolol tartrate (LOPRESSOR) 25 MG  tablet Take 1 tablet (25 mg total) by mouth 2 (two) times daily. For high blood pressure 06/26/17  Yes Nwoko, Nicole Kindred I, NP  Multiple Vitamin (MULTIVITAMIN WITH MINERALS) TABS tablet Take 1 tablet by mouth daily. For Vitamin supplement 06/26/17  Yes Nwoko, Nicole Kindred I, NP  risperiDONE (RISPERDAL) 1 MG tablet Take 1 tablet (1 mg) by mouth in the mornings & 2 tablets (2 mg) at bedtime: For mood control Patient taking differently: Take 1-2 mg by mouth 2 (two) times daily. Take 1 tablet (1 mg) by mouth in the mornings & 2 tablets (2 mg) at bedtime: For mood control 06/26/17  Yes Nwoko, Nicole Kindred I, NP  sertraline (ZOLOFT) 50 MG tablet Take 1 tablet (50 mg total) by mouth daily. For depression 06/27/17  Yes Armandina Stammer I, NP  traZODone (DESYREL) 100 MG tablet Take 1 tablet (100 mg total) by mouth at bedtime as needed for sleep. 06/26/17  Yes Armandina Stammer I, NP  HYDROcodone-acetaminophen (NORCO/VICODIN) 5-325 MG tablet Take 1-2 tablets by mouth every 6 hours as needed for pain. 07/07/17   Pisciotta, Joni Reining, PA-C  insulin aspart protamine- aspart (NOVOLOG MIX 70/30) (70-30) 100 UNIT/ML injection Inject 0.45 mLs (45 Units total) into the skin daily with supper. For diabetes management Patient not taking: Reported on 07/07/2017 06/26/17   Armandina Stammer I, NP  lidocaine (LIDODERM) 5 % Place 1 patch onto the skin daily. Remove & Discard patch within 12 hours or as directed by MD: For pain management Patient not taking: Reported on 07/07/2017 06/27/17   Armandina Stammer I, NP  nicotine (NICODERM CQ - DOSED IN MG/24 HOURS) 14 mg/24hr patch Place 1 patch (14 mg total) onto the skin daily. (May purchase over the counter): For smoking cessation Patient not taking: Reported on 07/07/2017 06/27/17   Armandina Stammer I, NP    Inpatient Medications: Scheduled Meds: . aspirin EC  325 mg Oral Daily  . atorvastatin  40 mg Oral Daily  . enoxaparin (LOVENOX) injection  40 mg Subcutaneous Q24H  . gabapentin  300 mg Oral BID  .  hydrochlorothiazide  25 mg Oral Daily  . insulin aspart  0-9 Units Subcutaneous TID WC  . insulin aspart protamine- aspart  30 Units Subcutaneous BID WC  . lidocaine  1 patch Transdermal Daily  . metoprolol tartrate  25 mg Oral BID  . multivitamin with minerals  1 tablet Oral Daily  . nicotine  14 mg Transdermal Daily  . risperiDONE  1-2 mg Oral BID  . sertraline  50 mg Oral Daily   Continuous Infusions: . sodium chloride 75 mL/hr at 07/09/17 0430   PRN Meds: acetaminophen, gi cocktail, HYDROcodone-acetaminophen, hydrOXYzine, morphine injection, nitroGLYCERIN, ondansetron (ZOFRAN) IV, traZODone, zolpidem  Allergies:   No  Known Allergies  Social History:   Social History   Socioeconomic History  . Marital status: Divorced    Spouse name: Not on file  . Number of children: Not on file  . Years of education: Not on file  . Highest education level: Not on file  Social Needs  . Financial resource strain: Not on file  . Food insecurity - worry: Not on file  . Food insecurity - inability: Not on file  . Transportation needs - medical: Not on file  . Transportation needs - non-medical: Not on file  Occupational History  . Not on file  Tobacco Use  . Smoking status: Current Every Day Smoker    Packs/day: 0.50    Years: 38.00    Pack years: 19.00    Types: Cigarettes  . Smokeless tobacco: Current User    Types: Chew  Substance and Sexual Activity  . Alcohol use: Yes    Comment: 06/12/2017 "stopped ~ 1 yr ago"   . Drug use: Yes    Types: Cocaine    Comment: 06/12/2017 "stopped using cocaine ~ 1 yr ago; used marijuana when I was younger" last cocaine use 9 mths ago per pt on 06/15/17  . Sexual activity: Not Currently  Other Topics Concern  . Not on file  Social History Narrative  . Not on file    Family History:    Family History  Problem Relation Age of Onset  . Hypertension Mother   . Diabetes Mother   . Heart disease Father   . Hypertension Sister   . Diabetes  Sister   . Hypertension Brother   . Diabetes Brother   . Hypertension Sister   . Diabetes Sister   . Hypertension Brother   . Diabetes Brother      ROS:  Please see the history of present illness.  ROS  General:no colds or fevers, no weight changes Skin:no rashes or ulcers HEENT:no blurred vision, no congestion CV:see HPI PUL:see HPI GI:no diarrhea constipation or melena, no indigestion GU:no hematuria, no dysuria MS:no joint pain, no claudication Neuro:+ syncope when he stood up quickly a few weeks ago, no lightheadedness Endo:+ diabetes poorly controlled, no thyroid disease      Physical Exam/Data:   Vitals:   07/09/17 0930 07/09/17 1119 07/09/17 1122 07/09/17 1546  BP: 131/79 107/69  123/84  Pulse: 86 85    Resp:      Temp:  (!) 97.5 F (36.4 C)    TempSrc:  Oral    SpO2: 96% 97%    Weight:   254 lb 9.6 oz (115.5 kg)   Height:   6\' 2"  (1.88 m)     Intake/Output Summary (Last 24 hours) at 07/09/2017 1619 Last data filed at 07/09/2017 1500 Gross per 24 hour  Intake 787.5 ml  Output -  Net 787.5 ml   Filed Weights   07/09/17 0119 07/09/17 1122  Weight: 260 lb (117.9 kg) 254 lb 9.6 oz (115.5 kg)   Body mass index is 32.69 kg/m.  General:  Well nourished, well developed, in no acute distress though he complains of some mild chest pain.  HEENT: normal Lymph: no adenopathy Neck: no JVD Endocrine:  No thryomegaly Vascular: No carotid bruits; 1+ pedal pulses bil Cardiac:  normal S1, S2; RRR; no murmur gallup rub or click Lungs:  clear to auscultation bilaterally, no wheezing, rhonchi or rales  Abd: soft, nontender, no hepatomegaly  Ext: no edema Musculoskeletal:  No deformities, BUE and BLE strength normal  and equal Skin: warm and dry  Neuro:  Alert and oriented X 3 MAE follows commands, + facial symmetry  no focal abnormalities noted Psych:  Normal affect   Relevant CV Studies: Cardiac cath 2008  The left main is a large vessel with no significant  disease.   The LAD is a large vessel which coursed through the apex and gives rise  to two diagonal branches.  The LAD has no severe disease.   The first diagonal is a medium-sized vessel which bifurcates distally  with no significant disease.   The second diagonal is a small-to-medium-sized vessel with no  significant disease.   The left circumflex is a medium-sized vessel coursing the AV groove and  gives rise to two obtuse marginal branches.  The AV circumflex has no  significant disease.   The first OM is a medium-sized vessel which bifurcates distally with no  significant disease.   The second OM is a small-sized with no significant disease.   The right coronary artery is a large vessel, dominant, and gives rise to  both the PDA as well as the posterolateral branch.  There is no  significant disease in the RCA and PDA and posterolateral branch.   The left ventriculogram reveals a mildly-depressed EF of 45-50% with  mild anterolateral hypokinesis.   Hemodynamic systemic arterial pressure 101/65, LV systemic system  pressure of 101/2, LVEDP of 8.   CONCLUSIONS:  1. No significant coronary artery disease.  2. Mildly-depressed left ventricular systolic function.     Laboratory Data:  Chemistry Recent Labs  Lab 07/07/17 0444 07/07/17 1716 07/09/17 0155  NA 137 133* 136  K 4.1 4.5 3.6  CL 103 101 105  CO2 23 22 19*  GLUCOSE 257* 401* 175*  BUN 15 17 11   CREATININE 1.10 1.21 1.12  CALCIUM 9.4 9.3 8.9  GFRNONAA >60 >60 >60  GFRAA >60 >60 >60  ANIONGAP 11 10 12     Recent Labs  Lab 07/07/17 0444 07/07/17 1716  PROT 7.8 7.5  ALBUMIN 4.2 4.0  AST 21 20  ALT 26 26  ALKPHOS 73 87  BILITOT 0.8 0.7   Hematology Recent Labs  Lab 07/07/17 0444 07/07/17 1716 07/09/17 0155  WBC 9.4 7.8 8.8  RBC 5.46 5.40 5.13  HGB 15.0 14.8 13.9  HCT 43.9 43.5 40.9  MCV 80.4 80.6 79.7  MCH 27.5 27.4 27.1  MCHC 34.2 34.0 34.0  RDW 13.6 13.9 13.4  PLT 243  233 215   Cardiac Enzymes Recent Labs  Lab 07/09/17 0155 07/09/17 0920 07/09/17 1438  TROPONINI <0.03 <0.03 <0.03    Recent Labs  Lab 07/09/17 0206  TROPIPOC 0.00     Lipid Panel     Component Value Date/Time   CHOL 85 06/10/2017 2122   TRIG 101 06/10/2017 2122   HDL 22 (L) 06/10/2017 2122   CHOLHDL 3.9 06/10/2017 2122   VLDL 20 06/10/2017 2122   LDLCALC 43 06/10/2017 2122   BNPNo results for input(s): BNP, PROBNP in the last 168 hours.  DDimer No results for input(s): DDIMER in the last 168 hours.  Radiology/Studies:  Dg Chest 2 View  Result Date: 07/09/2017 CLINICAL DATA:  Lethargy and confusion. Central chest pain. Dizziness. EXAM: CHEST  2 VIEW COMPARISON:  06/10/2017 FINDINGS: Shallow inspiration with linear atelectasis in the lung bases, progressing since previous study. Mild cardiac enlargement. No vascular congestion. No edema. No blunting of costophrenic angles. No pneumothorax. Mediastinal contours appear intact. IMPRESSION: Shallow inspiration with linear atelectasis  in the lung bases, progressing since previous study. Electronically Signed   By: Burman Nieves M.D.   On: 07/09/2017 02:44   Ct Abdomen Pelvis W Contrast  Result Date: 07/07/2017 CLINICAL DATA:  56 year old male with abdominal pain. EXAM: CT ABDOMEN AND PELVIS WITH CONTRAST TECHNIQUE: Multidetector CT imaging of the abdomen and pelvis was performed using the standard protocol following bolus administration of intravenous contrast. CONTRAST:  <See Chart> ISOVUE-300 IOPAMIDOL (ISOVUE-300) INJECTION 61% COMPARISON:  None. FINDINGS: Lower chest: There is mild eventration of the right hemidiaphragm. Right lung base linear atelectatic changes/scarring. Pneumonia is not entirely excluded. Clinical correlation is recommended. There is mild bilateral lower lobe bronchiectatic changes. There is no intra-abdominal free air or free fluid. Hepatobiliary: There is apparent mild fatty infiltration of the liver. No  intrahepatic biliary ductal dilatation. The gallbladder is unremarkable. Pancreas: Unremarkable. No pancreatic ductal dilatation or surrounding inflammatory changes. Spleen: Normal in size without focal abnormality. Adrenals/Urinary Tract: The adrenal glands are unremarkable. There is a 2.5 cm left renal inferior pole cyst. There is no hydronephrosis on either side. There is symmetric enhancement and excretion of contrast by both kidneys. The visualized ureters and urinary bladder appear unremarkable. Stomach/Bowel: There is no evidence of bowel obstruction or active inflammation. Normal appendix. Vascular/Lymphatic: Mild aortoiliac atherosclerotic disease. The IVC is grossly unremarkable. No portal venous gas. There is no adenopathy. Reproductive: The prostate and seminal vesicles are grossly unremarkable. Other: Small fat containing umbilical hernia. No fluid collection or inflammatory changes. Musculoskeletal: No acute or significant osseous findings. IMPRESSION: 1. No acute intra-abdominal or pelvic pathology. No bowel obstruction or active inflammation. Normal appendix. 2. Mild fatty liver. 3. Mild eventration of the right hemidiaphragm with right lung base linear atelectasis/scarring. Mild bilateral lower lobe bronchiectatic changes. Electronically Signed   By: Elgie Collard M.D.   On: 07/07/2017 23:08    Assessment and Plan:   1. Chest pain , neg MI, hx of CAD -- Nuc study in AM Dr. Tresa Endo to see.  2. CAD with hx of stents but I do not see in computer only cath is 2008.   On ASA 3. HLD on lipito 40  4. HTN  Elevated once now controlled on lopressor 25 BID 5. Tobacco use on nicoderm now, have recommended cessation  6. Schizoaffective disorder per IM 7. DM-2 insulin dependent per IM poorly controlled   For questions or updates, please contact [facility] Please consult [email or web address] for contact info under Cardiology/STEMI.   Charles Orr Lady Saucier 07/09/2017 4:19 PM  Patient seen and  examined. Agree with assessment and plan.   Mr. Jerrelle Michelsen is a 56 year old African-American male who has a 30 year history of type 2 diabetes mellitus, a 15 year history of hypertension, a history of hyperlipidemia, and also a history of drug use (cocaine/marijuana) as well as schizoaffective disorder.  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  In 2008 he underwent cardiac catheterization by Dr. Lenise Herald and was not found to have significant obstructive CAD.  Recently on 06/11/17, he had undergone a chest CT and was found to have pneumonia.  Of note, during that evaluation, he was not found to have aortic atherosclerosis and there was no evidence for significant coronary calcification. He was admitted to the hospital early this morning after developing an episode of chest pain and tightness as if someone kicked him in the chest.  It also was sudden and sharp and radiated to his left shoulder.  He did note some mild diaphoresis.  Troponins are negative.  His ECG independently reviewed by me shows sinus rhythm at 86 with non diagnostic inferior T changes.  Presently, he is pain-free.  His diabetes has been poorly controlled and hemoglobin A1c was 10. Recent LDL cholesterol was excellent at 43. With risk factors.  Will schedule patient for a nuclear perfusion study.  A 2-D echo Doppler study has already been ordered but has not yet been done.  Patient will need more aggressive control of his diabetes mellitus.  I discussed the importance of tobacco cessation. Will  follow patient   Lennette Bihari, MD, [facility] 07/09/2017 4:23 PM

## 2017-07-10 ENCOUNTER — Observation Stay (HOSPITAL_BASED_OUTPATIENT_CLINIC_OR_DEPARTMENT_OTHER): Payer: Medicaid Other

## 2017-07-10 ENCOUNTER — Observation Stay (HOSPITAL_COMMUNITY): Payer: Medicaid Other

## 2017-07-10 DIAGNOSIS — I209 Angina pectoris, unspecified: Secondary | ICD-10-CM

## 2017-07-10 DIAGNOSIS — R079 Chest pain, unspecified: Secondary | ICD-10-CM

## 2017-07-10 DIAGNOSIS — G8929 Other chronic pain: Secondary | ICD-10-CM | POA: Diagnosis present

## 2017-07-10 DIAGNOSIS — E7800 Pure hypercholesterolemia, unspecified: Secondary | ICD-10-CM | POA: Diagnosis present

## 2017-07-10 DIAGNOSIS — E1165 Type 2 diabetes mellitus with hyperglycemia: Secondary | ICD-10-CM | POA: Diagnosis present

## 2017-07-10 DIAGNOSIS — Z7982 Long term (current) use of aspirin: Secondary | ICD-10-CM | POA: Diagnosis not present

## 2017-07-10 DIAGNOSIS — I6381 Other cerebral infarction due to occlusion or stenosis of small artery: Secondary | ICD-10-CM | POA: Diagnosis not present

## 2017-07-10 DIAGNOSIS — Z794 Long term (current) use of insulin: Secondary | ICD-10-CM | POA: Diagnosis not present

## 2017-07-10 DIAGNOSIS — F17210 Nicotine dependence, cigarettes, uncomplicated: Secondary | ICD-10-CM | POA: Diagnosis present

## 2017-07-10 DIAGNOSIS — I252 Old myocardial infarction: Secondary | ICD-10-CM | POA: Diagnosis not present

## 2017-07-10 DIAGNOSIS — F250 Schizoaffective disorder, bipolar type: Secondary | ICD-10-CM | POA: Diagnosis present

## 2017-07-10 DIAGNOSIS — I25119 Atherosclerotic heart disease of native coronary artery with unspecified angina pectoris: Secondary | ICD-10-CM | POA: Diagnosis present

## 2017-07-10 DIAGNOSIS — E669 Obesity, unspecified: Secondary | ICD-10-CM | POA: Diagnosis present

## 2017-07-10 DIAGNOSIS — F419 Anxiety disorder, unspecified: Secondary | ICD-10-CM | POA: Diagnosis present

## 2017-07-10 DIAGNOSIS — K219 Gastro-esophageal reflux disease without esophagitis: Secondary | ICD-10-CM | POA: Diagnosis present

## 2017-07-10 DIAGNOSIS — Z6833 Body mass index (BMI) 33.0-33.9, adult: Secondary | ICD-10-CM | POA: Diagnosis not present

## 2017-07-10 DIAGNOSIS — I10 Essential (primary) hypertension: Secondary | ICD-10-CM | POA: Diagnosis present

## 2017-07-10 DIAGNOSIS — M545 Low back pain: Secondary | ICD-10-CM | POA: Diagnosis present

## 2017-07-10 DIAGNOSIS — Z955 Presence of coronary angioplasty implant and graft: Secondary | ICD-10-CM | POA: Diagnosis not present

## 2017-07-10 DIAGNOSIS — R931 Abnormal findings on diagnostic imaging of heart and coronary circulation: Secondary | ICD-10-CM | POA: Diagnosis not present

## 2017-07-10 DIAGNOSIS — E785 Hyperlipidemia, unspecified: Secondary | ICD-10-CM | POA: Diagnosis not present

## 2017-07-10 DIAGNOSIS — Z79899 Other long term (current) drug therapy: Secondary | ICD-10-CM | POA: Diagnosis not present

## 2017-07-10 DIAGNOSIS — G459 Transient cerebral ischemic attack, unspecified: Secondary | ICD-10-CM | POA: Diagnosis not present

## 2017-07-10 DIAGNOSIS — M797 Fibromyalgia: Secondary | ICD-10-CM | POA: Diagnosis present

## 2017-07-10 HISTORY — DX: Chest pain, unspecified: R07.9

## 2017-07-10 LAB — GLUCOSE, CAPILLARY
Glucose-Capillary: 263 mg/dL — ABNORMAL HIGH (ref 65–99)
Glucose-Capillary: 326 mg/dL — ABNORMAL HIGH (ref 65–99)
Glucose-Capillary: 174 mg/dL — ABNORMAL HIGH (ref 65–99)

## 2017-07-10 LAB — ECHOCARDIOGRAM COMPLETE
HEIGHTINCHES: 74 in
Weight: 4052.8 oz

## 2017-07-10 LAB — NM MYOCAR MULTI W/SPECT W/WALL MOTION / EF
CSEPED: 5 min
CSEPEDS: 26 s
Peak HR: 96 {beats}/min
Rest HR: 73 {beats}/min

## 2017-07-10 MED ORDER — TECHNETIUM TC 99M TETROFOSMIN IV KIT
30.0000 | PACK | Freq: Once | INTRAVENOUS | Status: AC | PRN
  Administered 2017-07-10: 30 via INTRAVENOUS

## 2017-07-10 MED ORDER — INSULIN ASPART PROT & ASPART (70-30 MIX) 100 UNIT/ML ~~LOC~~ SUSP
45.0000 [IU] | Freq: Every day | SUBCUTANEOUS | Status: DC
  Administered 2017-07-11: 45 [IU] via SUBCUTANEOUS

## 2017-07-10 MED ORDER — INSULIN ASPART 100 UNIT/ML ~~LOC~~ SOLN: 0.0000 [IU] | Freq: Three times a day (TID) | SUBCUTANEOUS | Status: DC

## 2017-07-10 MED ORDER — INSULIN ASPART 100 UNIT/ML ~~LOC~~ SOLN
0.0000 [IU] | Freq: Three times a day (TID) | SUBCUTANEOUS | Status: DC
  Administered 2017-07-11: 5 [IU] via SUBCUTANEOUS
  Administered 2017-07-11: 2 [IU] via SUBCUTANEOUS
  Administered 2017-07-11: 5 [IU] via SUBCUTANEOUS

## 2017-07-10 MED ORDER — SODIUM CHLORIDE 0.9% FLUSH
3.0000 mL | Freq: Two times a day (BID) | INTRAVENOUS | Status: DC
  Administered 2017-07-10: 3 mL via INTRAVENOUS

## 2017-07-10 MED ORDER — TECHNETIUM TC 99M TETROFOSMIN IV KIT: 30.0000 | PACK | Freq: Once | INTRAVENOUS | Status: DC | PRN

## 2017-07-10 MED ORDER — INSULIN ASPART 100 UNIT/ML ~~LOC~~ SOLN: 0.0000 [IU] | Freq: Every day | SUBCUTANEOUS | Status: DC

## 2017-07-10 MED ORDER — ASPIRIN 81 MG PO CHEW
81.0000 mg | CHEWABLE_TABLET | ORAL | Status: AC
  Administered 2017-07-11: 81 mg via ORAL
  Filled 2017-07-10: qty 1

## 2017-07-10 MED ORDER — INSULIN ASPART PROT & ASPART (70-30 MIX) 100 UNIT/ML ~~LOC~~ SUSP
15.0000 [IU] | Freq: Once | SUBCUTANEOUS | Status: AC
  Administered 2017-07-10: 15 [IU] via SUBCUTANEOUS

## 2017-07-10 MED ORDER — INSULIN ASPART PROT & ASPART (70-30 MIX) 100 UNIT/ML ~~LOC~~ SUSP
60.0000 [IU] | Freq: Every day | SUBCUTANEOUS | Status: DC
  Administered 2017-07-11: 60 [IU] via SUBCUTANEOUS

## 2017-07-10 MED ORDER — TECHNETIUM TC 99M TETROFOSMIN IV KIT
10.0000 | PACK | Freq: Once | INTRAVENOUS | Status: AC | PRN
  Administered 2017-07-10: 10 via INTRAVENOUS

## 2017-07-10 MED ORDER — INSULIN ASPART PROT & ASPART (70-30 MIX) 100 UNIT/ML ~~LOC~~ SUSP: 45.0000 [IU] | Freq: Every day | SUBCUTANEOUS | Status: DC

## 2017-07-10 MED ORDER — SODIUM CHLORIDE 0.9 % WEIGHT BASED INFUSION
3.0000 mL/kg/h | INTRAVENOUS | Status: AC
  Administered 2017-07-11: 3 mL/kg/h via INTRAVENOUS

## 2017-07-10 MED ORDER — SODIUM CHLORIDE 0.9 % WEIGHT BASED INFUSION: 1.0000 mL/kg/h | INTRAVENOUS | Status: DC

## 2017-07-10 MED ORDER — REGADENOSON 0.4 MG/5ML IV SOLN
INTRAVENOUS | Status: AC
  Filled 2017-07-10: qty 5

## 2017-07-10 MED ORDER — SODIUM CHLORIDE 0.9 % IV SOLN: 250.0000 mL | INTRAVENOUS | Status: DC | PRN

## 2017-07-10 MED ORDER — SODIUM CHLORIDE 0.9% FLUSH: 3.0000 mL | INTRAVENOUS | Status: DC | PRN

## 2017-07-10 MED ORDER — REGADENOSON 0.4 MG/5ML IV SOLN
0.4000 mg | Freq: Once | INTRAVENOUS | Status: AC
  Administered 2017-07-10: 0.4 mg via INTRAVENOUS
  Filled 2017-07-10: qty 5

## 2017-07-10 NOTE — Progress Notes (Addendum)
Progress Note  Patient Name: Charles Orr Date of Encounter: 07/10/2017  Primary Cardiologist:  New Dr. Tresa Endo  Subjective   Pt seen in stress lab. Pt with no chest pain since admitted. Has had some vague brief shortness of breath over night. No orthopnea.   Inpatient Medications    Scheduled Meds: . aspirin EC  325 mg Oral Daily  . atorvastatin  40 mg Oral Daily  . enoxaparin (LOVENOX) injection  40 mg Subcutaneous Q24H  . gabapentin  300 mg Oral BID  . hydrochlorothiazide  25 mg Oral Daily  . insulin aspart  0-9 Units Subcutaneous TID WC  . insulin aspart protamine- aspart  30 Units Subcutaneous BID WC  . lidocaine  1 patch Transdermal Daily  . metoprolol tartrate  25 mg Oral BID  . multivitamin with minerals  1 tablet Oral Daily  . nicotine  14 mg Transdermal Daily  . regadenoson      . risperiDONE  1-2 mg Oral BID  . sertraline  50 mg Oral Daily   Continuous Infusions: . sodium chloride 75 mL/hr at 07/09/17 2133   PRN Meds: acetaminophen, gi cocktail, HYDROcodone-acetaminophen, hydrOXYzine, morphine injection, nitroGLYCERIN, ondansetron (ZOFRAN) IV, traZODone, zolpidem   Vital Signs    Vitals:   07/10/17 0900 07/10/17 0959 07/10/17 1001 07/10/17 1003  BP: 134/88 (!) 150/89 128/82 133/72  Pulse: 67 99 80 73  Resp:      Temp:      TempSrc:      SpO2:      Weight:      Height:        Intake/Output Summary (Last 24 hours) at 07/10/2017 1014 Last data filed at 07/10/2017 0600 Gross per 24 hour  Intake 2632.5 ml  Output -  Net 2632.5 ml   Filed Weights   07/09/17 0119 07/09/17 1122 07/10/17 0500  Weight: 260 lb (117.9 kg) 254 lb 9.6 oz (115.5 kg) 253 lb 4.8 oz (114.9 kg)    Telemetry    SR   ECG    NSR 66 bpm - Personally Reviewed  Physical Exam   GEN: No acute distress.   Neck: No JVD Cardiac: RRR, no murmurs, rubs, or gallops.  Respiratory: Clear to auscultation bilaterally. GI: Soft, nontender, non-distended  MS: No edema; No  deformity. Neuro:  Nonfocal  Psych: Normal affect   Labs    Chemistry Recent Labs  Lab 07/07/17 0444 07/07/17 1716 07/09/17 0155  NA 137 133* 136  K 4.1 4.5 3.6  CL 103 101 105  CO2 23 22 19*  GLUCOSE 257* 401* 175*  BUN 15 17 11   CREATININE 1.10 1.21 1.12  CALCIUM 9.4 9.3 8.9  PROT 7.8 7.5  --   ALBUMIN 4.2 4.0  --   AST 21 20  --   ALT 26 26  --   ALKPHOS 73 87  --   BILITOT 0.8 0.7  --   GFRNONAA >60 >60 >60  GFRAA >60 >60 >60  ANIONGAP 11 10 12      Hematology Recent Labs  Lab 07/07/17 0444 07/07/17 1716 07/09/17 0155  WBC 9.4 7.8 8.8  RBC 5.46 5.40 5.13  HGB 15.0 14.8 13.9  HCT 43.9 43.5 40.9  MCV 80.4 80.6 79.7  MCH 27.5 27.4 27.1  MCHC 34.2 34.0 34.0  RDW 13.6 13.9 13.4  PLT 243 233 215    Cardiac Enzymes Recent Labs  Lab 07/09/17 0155 07/09/17 0920 07/09/17 1438  TROPONINI <0.03 <0.03 <0.03    Recent  Labs  Lab 07/09/17 0206  TROPIPOC 0.00   Lipid Panel     Component Value Date/Time   CHOL 85 06/10/2017 2122   TRIG 101 06/10/2017 2122   HDL 22 (L) 06/10/2017 2122   CHOLHDL 3.9 06/10/2017 2122   VLDL 20 06/10/2017 2122   LDLCALC 43 06/10/2017 2122     BNPNo results for input(s): BNP, PROBNP in the last 168 hours.   DDimer No results for input(s): DDIMER in the last 168 hours.   Radiology    Dg Chest 2 View  Result Date: 07/09/2017 CLINICAL DATA:  Lethargy and confusion. Central chest pain. Dizziness. EXAM: CHEST  2 VIEW COMPARISON:  06/10/2017 FINDINGS: Shallow inspiration with linear atelectasis in the lung bases, progressing since previous study. Mild cardiac enlargement. No vascular congestion. No edema. No blunting of costophrenic angles. No pneumothorax. Mediastinal contours appear intact. IMPRESSION: Shallow inspiration with linear atelectasis in the lung bases, progressing since previous study. Electronically Signed   By: Burman Nieves M.D.   On: 07/09/2017 02:44    Cardiac Studies   Echo pending  Patient Profile      56 y.o. male  with a hx of DM-2, tobacco use, obesity, schizophrenia, chronic back pain and CAD with stents placed in 2012 who is being seen for the evaluation of chest pain. Presented for chest pain like someone kicking him in the chest. Negative troponins. EKG with sinus rhythm at 86 with non diagnostic inferior T changes.  Assessment & Plan    1. Chest pain , neg MI, hx of CAD. No further chest pain. -- Nuc study in process.  2. Prior cath 2008; without significant disease 3. HLD on lipito 40  4. HTN  15 yrs history. Elevated once now controlled on lopressor 25 BID 5. Tobacco use on nicoderm now, have recommended cessation  6. Schizoaffective disorder per IM 7. DM-2 insulin dependent per IM poorly controlled    For questions or updates, please contact [facility] Please consult [email or web address] for contact info under Cardiology/STEMI.      Signed, Berton Bon, NP  07/10/2017, 10:14 AM     Patient seen and examined. Agree with assessment and plan. Trop neg x 3. Echo and nuclear study to be done today, currently in progress. Await completion of studies. LDL 43 on atorvastatin.   Lennette Bihari, MD, [facility] 07/10/2017 11:34 AM   Addendum:  ------------------------------------------------------------------- ECHO Study Conclusions  - Left ventricle: The cavity size was normal. Wall thickness was   increased in a pattern of mild LVH. Systolic function was normal.   The estimated ejection fraction was in the range of 55% to 60%.   Wall motion was normal; there were no regional wall motion   abnormalities. Left ventricular diastolic function parameters   were normal. - Aortic valve: There was no stenosis. - Mitral valve: There was no significant regurgitation. - Right ventricle: The cavity size was normal. Systolic function   was normal. - Pulmonary arteries: No complete TR doppler jet so unable to   estimate PA systolic pressure. - Inferior vena cava: The vessel was  normal in size. The   respirophasic diameter changes were in the normal range (>= 50%),   consistent with normal central venous pressure.  Impressions:  - Normal LV size with mild LV hypertrophy. EF 55-60%. Normal   diastolic function. Normal RV size and systolic function. No   significant valvular abnormalities.  Nuclear IMPRESSION: 1. No abnormal areas of inducible myocardial reversibility identified. There is a  mild fixed defect involving the proximal, mid and distal inferior wall on both the rest and stress images.  2. Normal left ventricular wall motion.  3. Left ventricular ejection fraction 54%  4. Non invasive risk stratification*: Low  *2012 Appropriate Use Criteria for Coronary Revascularization Focused Update: J Am Coll Cardiol. 2012;59(9):857-881. http://content.dementiazones.comonlinejacc.org/article.aspx?articleid=1201161   Electronically Signed   By: Signa Kellaylor  Stroud M.D.   On: 07/10/2017 14:35  I have discussed with patient and personally reviewed the nuclear images. Pt developed significant chest pressure/tightness during the procedure. By my review of the images there does appear to be mild stress mediated ischemia in the inferior wall,  (SDS 6; extent 10%; TPS 8%). He states the discomfort was rough and mimicked the chest pain that brought him into the hospital.  I discussed options with patient. He prefers definitive evaluation and therefore, will schedule for diagnostic cath tomorrow. I have reviewed the risks, indications, and alternatives to cardiac catheterization, possible angioplasty, and stenting with the patient. Risks include but are not limited to bleeding, infection, vascular injury, stroke, myocardial infection, arrhythmia, kidney injury, radiation-related injury in the case of prolonged fluoroscopy use, emergency cardiac surgery, and death. The patient understands the risks of serious complication is 1-2 in 1000 with diagnostic cardiac cath and 1-2% or less with  angioplasty/stenting.   Nicki Guadalajarahomas Elier Zellars, MD  07/10/2017  4:51 PM

## 2017-07-10 NOTE — Progress Notes (Signed)
  Echocardiogram 2D Echocardiogram has been performed.  Tye SavoyCasey N Rosalio Catterton 07/10/2017, 12:35 PM

## 2017-07-10 NOTE — Progress Notes (Signed)
PROGRESS NOTE    Charles PortaKarl J Macapagal  ZOX:[id number] DOB: 1960-09-17 DOA: 07/09/2017 PCP: [facility], [facility]   Outpatient Specialists:    Brief Narrative:  Charles Orr is a 56 y.o. male who presents with CP of unknown etiology. H/o CAD, cath and stent placement in the setting of cocaine use. UDS pending and Cardiology consulted.    Assessment & Plan:   Principal Problem:   Angina pectoris (HCC) Active Problems:   Hyperglycemia   Schizoaffective disorder, bipolar type (HCC)   Tobacco abuse   Hypertension   Poorly controlled diabetes mellitus (HCC)   Hyperlipidemia with target LDL less than 70   Chest pain -CE negative -low risk ST -for cath in AM  DM -titrate medications for tighter control  Schizoaffective d/o -continue meds  Code Status: Full Code  Family Communication:  Disposition Plan: cath in AM   Consultants:   cards      Subjective: No chest pain  Objective: Vitals:   07/10/17 1001 07/10/17 1003 07/10/17 1213 07/10/17 1403  BP: 128/82 133/72 136/72 (!) 113/46  Pulse: 80 73 60 74  Resp:    18  Temp:    98.3 F (36.8 C)  TempSrc:    Oral  SpO2:    100%  Weight:      Height:        Intake/Output Summary (Last 24 hours) at 07/10/2017 1720 Last data filed at 07/10/2017 0900 Gross per 24 hour  Intake 1845 ml  Output -  Net 1845 ml   Filed Weights   07/09/17 0119 07/09/17 1122 07/10/17 0500  Weight: 117.9 kg (260 lb) 115.5 kg (254 lb 9.6 oz) 114.9 kg (253 lb 4.8 oz)    Examination:  General exam: Appears calm and comfortable  Respiratory system: Clear to auscultation. Respiratory effort normal.     Data Reviewed: I have personally reviewed following labs and imaging studies  CBC: Recent Labs  Lab 07/07/17 0444 07/07/17 1716 07/09/17 0155  WBC 9.4 7.8 8.8  HGB 15.0 14.8 13.9  HCT 43.9 43.5 40.9  MCV 80.4 80.6 79.7  PLT 243 233 215   Basic Metabolic Panel: Recent Labs  Lab 07/07/17 0444 07/07/17 1716  07/07/17 1800 07/09/17 0155  NA 137 133*  --  136  K 4.1 4.5  --  3.6  CL 103 101  --  105  CO2 23 22  --  19*  GLUCOSE 257* 401*  --  175*  BUN 15 17  --  11  CREATININE 1.10 1.21  --  1.12  CALCIUM 9.4 9.3  --  8.9  MG  --   --  2.3  --    GFR: Estimated Creatinine Clearance: 100.5 mL/min (by C-G formula based on SCr of 1.12 mg/dL). Liver Function Tests: Recent Labs  Lab 07/07/17 0444 07/07/17 1716  AST 21 20  ALT 26 26  ALKPHOS 73 87  BILITOT 0.8 0.7  PROT 7.8 7.5  ALBUMIN 4.2 4.0   Recent Labs  Lab 07/07/17 1716  LIPASE 27   No results for input(s): AMMONIA in the last 168 hours. Coagulation Profile: Recent Labs  Lab 07/07/17 1800  INR 1.10   Cardiac Enzymes: Recent Labs  Lab 07/09/17 0155 07/09/17 0920 07/09/17 1438  TROPONINI <0.03 <0.03 <0.03   BNP (last 3 results) No results for input(s): PROBNP in the last 8760 hours. HbA1C: Recent Labs    07/09/17 0920  HGBA1C 10.0*   CBG: Recent Labs  Lab 07/09/17 1349 07/09/17  1644 07/09/17 2112 07/10/17 1213 07/10/17 1628  GLUCAP 126* 130* 244* 263* 326*   Lipid Profile: No results for input(s): CHOL, HDL, LDLCALC, TRIG, CHOLHDL, LDLDIRECT in the last 72 hours. Thyroid Function Tests: No results for input(s): TSH, T4TOTAL, FREET4, T3FREE, THYROIDAB in the last 72 hours. Anemia Panel: No results for input(s): VITAMINB12, FOLATE, FERRITIN, TIBC, IRON, RETICCTPCT in the last 72 hours. Urine analysis:    Component Value Date/Time   COLORURINE YELLOW 07/07/2017 1716   APPEARANCEUR CLEAR 07/07/2017 1716   LABSPEC 1.024 07/07/2017 1716   PHURINE 5.0 07/07/2017 1716   GLUCOSEU >=500 (A) 07/07/2017 1716   HGBUR NEGATIVE 07/07/2017 1716   BILIRUBINUR NEGATIVE 07/07/2017 1716   KETONESUR NEGATIVE 07/07/2017 1716   PROTEINUR NEGATIVE 07/07/2017 1716   NITRITE NEGATIVE 07/07/2017 1716   LEUKOCYTESUR NEGATIVE 07/07/2017 1716   Sepsis Labs: @LABRCNTIP (procalcitonin:4,lacticidven:4)  )No results  found for this or any previous visit (from the past 240 hour(s)).    Anti-infectives (From admission, onward)   None       Radiology Studies: Dg Chest 2 View  Result Date: 07/09/2017 CLINICAL DATA:  Lethargy and confusion. Central chest pain. Dizziness. EXAM: CHEST  2 VIEW COMPARISON:  06/10/2017 FINDINGS: Shallow inspiration with linear atelectasis in the lung bases, progressing since previous study. Mild cardiac enlargement. No vascular congestion. No edema. No blunting of costophrenic angles. No pneumothorax. Mediastinal contours appear intact. IMPRESSION: Shallow inspiration with linear atelectasis in the lung bases, progressing since previous study. Electronically Signed   By: Burman NievesWilliam  Stevens M.D.   On: 07/09/2017 02:44   Nm Myocar Multi W/spect W/wall Motion / Ef  Result Date: 07/10/2017 CLINICAL DATA:  Chest pain. History of MI. Smoker. Hypertension. Diabetes. Elevated cholesterol. Shortness of breath. Coronary artery disease. EXAM: MYOCARDIAL IMAGING WITH SPECT (REST AND PHARMACOLOGIC-STRESS) GATED LEFT VENTRICULAR WALL MOTION STUDY LEFT VENTRICULAR EJECTION FRACTION TECHNIQUE: Standard myocardial SPECT imaging was performed after resting intravenous injection of 10 mCi Tc-8748m tetrofosmin. Subsequently, intravenous infusion of Lexiscan was performed under the supervision of the Cardiology staff. At peak effect of the drug, 30 mCi Tc-3648m tetrofosmin was injected intravenously and standard myocardial SPECT imaging was performed. Quantitative gated imaging was also performed to evaluate left ventricular wall motion, and estimate left ventricular ejection fraction. COMPARISON:  None. FINDINGS: Perfusion: There is diminished radiotracer activity on the rest and stress images involving the proximal mid and distal inferior wall. No abnormal areas of reversibility to suggest ischemia. Wall Motion: Normal left ventricular wall motion. No left ventricular dilation. Left Ventricular Ejection Fraction:  54 % End diastolic volume 114 ml End systolic volume 53 ml IMPRESSION: 1. No abnormal areas of inducible myocardial reversibility identified. There is a mild fixed defect involving the proximal, mid and distal inferior wall on both the rest and stress images. 2. Normal left ventricular wall motion. 3. Left ventricular ejection fraction 54% 4. Non invasive risk stratification*: Low *2012 Appropriate Use Criteria for Coronary Revascularization Focused Update: J Am Coll Cardiol. 2012;59(9):857-881. http://content.dementiazones.comonlinejacc.org/article.aspx?articleid=1201161 Electronically Signed   By: Signa Kellaylor  Stroud M.D.   On: 07/10/2017 14:35        Scheduled Meds: . aspirin EC  325 mg Oral Daily  . atorvastatin  40 mg Oral Daily  . enoxaparin (LOVENOX) injection  40 mg Subcutaneous Q24H  . gabapentin  300 mg Oral BID  . hydrochlorothiazide  25 mg Oral Daily  . insulin aspart  0-9 Units Subcutaneous TID WC  . insulin aspart protamine- aspart  30 Units Subcutaneous BID WC  .  lidocaine  1 patch Transdermal Daily  . metoprolol tartrate  25 mg Oral BID  . multivitamin with minerals  1 tablet Oral Daily  . nicotine  14 mg Transdermal Daily  . risperiDONE  1-2 mg Oral BID  . sertraline  50 mg Oral Daily   Continuous Infusions:   LOS: 0 days    Time spent: 15    Oceanna Arruda U Savoy [place], DO [facility] Pager [phone]  If 7PM-7AM, please contact night-coverage [email or web address] Password [email or web address] 07/10/2017, 5:20 PM

## 2017-07-10 NOTE — Plan of Care (Signed)
Patient aware of plan of care.  RN provided medication education on medications administered prior to administration.  Patient stated understanding.   

## 2017-07-11 ENCOUNTER — Encounter (HOSPITAL_COMMUNITY)
Admission: EM | Disposition: A | Discharge status: Home / Self Care | Payer: Self-pay | Source: Home / Self Care | Attending: Internal Medicine

## 2017-07-11 ENCOUNTER — Encounter (HOSPITAL_COMMUNITY): Payer: Self-pay | Admitting: Cardiovascular Disease

## 2017-07-11 ENCOUNTER — Ambulatory Visit (HOSPITAL_COMMUNITY): Admission: RE | Admit: 2017-07-11 | Payer: Medicaid Other | Source: Ambulatory Visit | Admitting: Cardiovascular Disease

## 2017-07-11 DIAGNOSIS — K219 Gastro-esophageal reflux disease without esophagitis: Secondary | ICD-10-CM

## 2017-07-11 DIAGNOSIS — R931 Abnormal findings on diagnostic imaging of heart and coronary circulation: Secondary | ICD-10-CM

## 2017-07-11 HISTORY — PX: LEFT HEART CATH AND CORONARY ANGIOGRAPHY: CATH118249

## 2017-07-11 LAB — BASIC METABOLIC PANEL
Anion gap: 6 (ref 5–15)
BUN: 13 mg/dL (ref 6–20)
CALCIUM: 8.9 mg/dL (ref 8.9–10.3)
CO2: 25 mmol/L (ref 22–32)
Chloride: 107 mmol/L (ref 101–111)
Creatinine, Ser: 1.05 mg/dL (ref 0.61–1.24)
GFR calc Af Amer: >60 mL/min (ref >60)
GLUCOSE: 196 mg/dL — AB (ref 65–99)
POTASSIUM: 3.7 mmol/L (ref 3.5–5.1)
SODIUM: 138 mmol/L (ref 135–145)

## 2017-07-11 LAB — GLUCOSE, CAPILLARY
GLUCOSE-CAPILLARY: 206 mg/dL — AB (ref 65–99)
GLUCOSE-CAPILLARY: 122 mg/dL — AB (ref 65–99)
Glucose-Capillary: 239 mg/dL — ABNORMAL HIGH (ref 65–99)

## 2017-07-11 SURGERY — LEFT HEART CATH AND CORONARY ANGIOGRAPHY
Anesthesia: LOCAL

## 2017-07-11 MED ORDER — FENTANYL CITRATE (PF) 100 MCG/2ML IJ SOLN
INTRAMUSCULAR | Status: AC
  Filled 2017-07-11: qty 2

## 2017-07-11 MED ORDER — INSULIN ASPART PROT & ASPART (70-30 MIX) 100 UNIT/ML ~~LOC~~ SUSP: 45.0000 [IU] | Freq: Two times a day (BID) | SUBCUTANEOUS | Status: DC

## 2017-07-11 MED ORDER — IOPAMIDOL (ISOVUE-370) INJECTION 76%
INTRAVENOUS | Status: DC | PRN
  Administered 2017-07-11: 70 mL via INTRA_ARTERIAL

## 2017-07-11 MED ORDER — LIVING WELL WITH DIABETES BOOK
Freq: Once | Status: AC
  Administered 2017-07-11: 17:00:00
  Filled 2017-07-11 (×2): qty 1

## 2017-07-11 MED ORDER — NITROGLYCERIN 0.4 MG SL SUBL: 0.4000 mg | SUBLINGUAL_TABLET | SUBLINGUAL | 0 refills | Status: DC | PRN

## 2017-07-11 MED ORDER — HEPARIN SODIUM (PORCINE) 1000 UNIT/ML IJ SOLN
INTRAMUSCULAR | Status: AC
  Filled 2017-07-11: qty 1

## 2017-07-11 MED ORDER — LIDOCAINE HCL (PF) 1 % IJ SOLN
INTRAMUSCULAR | Status: DC | PRN
  Administered 2017-07-11: 3 mL

## 2017-07-11 MED ORDER — LIDOCAINE HCL (PF) 1 % IJ SOLN
INTRAMUSCULAR | Status: AC
  Filled 2017-07-11: qty 30

## 2017-07-11 MED ORDER — SODIUM CHLORIDE 0.9% FLUSH: 3.0000 mL | INTRAVENOUS | Status: DC | PRN

## 2017-07-11 MED ORDER — FENTANYL CITRATE (PF) 100 MCG/2ML IJ SOLN
INTRAMUSCULAR | Status: DC | PRN
Start: 2017-07-11 — End: 2017-07-11
  Administered 2017-07-11: 25 ug via INTRAVENOUS

## 2017-07-11 MED ORDER — HEPARIN (PORCINE) IN NACL 2-0.9 UNIT/ML-% IJ SOLN
INTRAMUSCULAR | Status: DC | PRN
  Administered 2017-07-11: 10 mL via INTRA_ARTERIAL

## 2017-07-11 MED ORDER — HEPARIN (PORCINE) IN NACL 2-0.9 UNIT/ML-% IJ SOLN
INTRAMUSCULAR | Status: AC
  Filled 2017-07-11: qty 1000

## 2017-07-11 MED ORDER — SODIUM CHLORIDE 0.9 % IV SOLN: 250.0000 mL | INTRAVENOUS | Status: DC | PRN

## 2017-07-11 MED ORDER — VERAPAMIL HCL 2.5 MG/ML IV SOLN
INTRAVENOUS | Status: AC
  Filled 2017-07-11: qty 2

## 2017-07-11 MED ORDER — SODIUM CHLORIDE 0.9 % IV SOLN: INTRAVENOUS | Status: AC

## 2017-07-11 MED ORDER — MIDAZOLAM HCL 2 MG/2ML IJ SOLN
INTRAMUSCULAR | Status: DC | PRN
  Administered 2017-07-11: 1 mg via INTRAVENOUS

## 2017-07-11 MED ORDER — HEPARIN (PORCINE) IN NACL 2-0.9 UNIT/ML-% IJ SOLN
INTRAMUSCULAR | Status: AC | PRN
  Administered 2017-07-11: 1000 mL

## 2017-07-11 MED ORDER — HEPARIN SODIUM (PORCINE) 1000 UNIT/ML IJ SOLN
INTRAMUSCULAR | Status: DC | PRN
  Administered 2017-07-11: 6000 [IU] via INTRAVENOUS

## 2017-07-11 MED ORDER — MIDAZOLAM HCL 2 MG/2ML IJ SOLN
INTRAMUSCULAR | Status: AC
  Filled 2017-07-11: qty 2

## 2017-07-11 MED ORDER — SODIUM CHLORIDE 0.9% FLUSH: 3.0000 mL | Freq: Two times a day (BID) | INTRAVENOUS | Status: DC

## 2017-07-11 MED ORDER — IOPAMIDOL (ISOVUE-370) INJECTION 76%
INTRAVENOUS | Status: AC
  Filled 2017-07-11: qty 100

## 2017-07-11 SURGICAL SUPPLY — 9 items

## 2017-07-11 NOTE — Clinical Social Work Note (Addendum)
Clinical Social Work Assessment  Patient Details  Name: Charles Orr MRN: [id number] Date of Birth: [birth year]  Date of referral:  07/11/17               Reason for consult:  Transportation                Permission sought to share information with:    Permission granted to share information::     Name::        Agency::     Relationship::     Contact Information:     Housing/Transportation Living arrangements for the past 2 months:  Single Family Home Source of Information:  Patient Patient Interpreter Needed:  None Criminal Activity/Legal Involvement Pertinent to Current Situation/Hospitalization:  No - Comment as needed Significant Relationships:    Lives with:  Self Do you feel safe going back to the place where you live?  Yes Need for family participation in patient care:  No (Coment)  Care giving concerns:  Patient from home in [place]. Has been visiting friends In [place].   Social Worker assessment / plan: 10:56 CSW consulted for transportation needs. CSW met with patient at bedside. Patient believes he will not be discharged today and stating his priority is speaking to the nurse about cath entry site. Patient indicated he will return to [place] by bus and is familiar with bus system. CSW offered information on process for discharge and supporting with transport over to bus station, but patient did not want to discuss, stating he would like to speak to the nurse. CSW updated RN.   1:50 RNCM informed CSW that patient stated he will not go back to [place] and does not have a place to stay in [place]. CSW provided patient with list of area shelters and encouraged patient to call for bed availability.   CSW to support with transportation when patient discharged.  Employment status:    Insurance information:  Medicaid [place] PT Recommendations:  Not assessed at this time Information / Referral to community resources:  Other (Comment  Required)(transportation)  Patient/Family's Response to care: Patient appreciative of care.  Patient/Family's Understanding of and Emotional Response to Diagnosis, Current Treatment, and Prognosis: Patient with understanding of medical condition, however has questions about caring for cath entry site.   Emotional Assessment Appearance:  Appears stated age Attitude/Demeanor/Rapport:  Other(appropriate) Affect (typically observed):  Guarded Orientation:  Oriented to Self, Oriented to Place, Oriented to  Time, Oriented to Situation Alcohol / Substance use:  Not Applicable Psych involvement (Current and /or in the community):  No (Comment)  Discharge Needs  Concerns to be addressed:  Other (Comment Required(transportation) Readmission within the last 30 days:  No Current discharge risk:  None Barriers to Discharge:  No Barriers Identified   Estanislado Emms, LCSW 07/11/2017, 10:56 AM

## 2017-07-11 NOTE — Progress Notes (Signed)
Inpatient Diabetes Program Recommendations  AACE/ADA: New Consensus Statement on Inpatient Glycemic Control (2015)  Target Ranges:  Prepandial:   less than 140 mg/dL      Peak postprandial:   less than 180 mg/dL (1-2 hours)      Critically ill patients:  140 - 180 mg/dL   Lab Results  Component Value Date   GLUCAP 239 (H) 07/11/2017   HGBA1C 10.0 (H) 07/09/2017    Review of Glycemic Control  Inpatient Diabetes Program Recommendations:   Spoke with pt about A1C results 10.0 (average blood glucose 240 over the past 2-3 months) and explained what an A1C is, basic pathophysiology of DM Type 2, basic home care, basic diabetes diet nutrition principles, importance of checking CBGs and maintaining good CBG control to prevent long-term and short-term complications. Reviewed signs and symptoms of hyperglycemia and hypoglycemia and how to treat hypoglycemia at home. Also reviewed blood sugar goals at home.  RNs to provide ongoing basic DM education at bedside with this patient. Have ordered educational booklet.  Thank you, Billy FischerJudy E. Noni Stonesifer, RN, MSN, CDE  Diabetes Coordinator Inpatient Glycemic Control Team Team Pager [phone] (8am-5pm) 07/11/2017 2:50 PM

## 2017-07-11 NOTE — Interval H&P Note (Signed)
History and Physical Interval Note:  07/11/2017 9:49 AM  Charles Orr  has presented today for surgery, with the diagnosis of chest pain  The various methods of treatment have been discussed with the patient and family. After consideration of risks, benefits and other options for treatment, the patient has consented to  Procedure(s): LEFT HEART CATH AND CORONARY ANGIOGRAPHY (N/A) as a surgical intervention .  The patient's history has been reviewed, patient examined, no change in status, stable for surgery.  I have reviewed the patient's chart and labs.  Questions were answered to the patient's satisfaction.     Tonny Bollmanooper, Carron Mcmurry

## 2017-07-11 NOTE — Progress Notes (Addendum)
CSW received a call from pt's RN requesting resources for the pt due to the shelter's being full (staff called). Per pt, he [place] ride [place] the [place].  CSW suggested calling security to see if pt could sit in the waiting room until his ride arrives in the morning.   RN stated she would call security in the ED.  CSW noted earlier CSW's meeting with the pt today where CSW informed the pt of available resources and that pt refused to discuss options that were on the table further.  Please reconsult if future social work needs arise.  CSW signing off, as social work intervention is no longer needed.  Dorothe PeaJonathan F. Corabelle Spackman, LCSW, LCAS, CSI Clinical Social Worker Ph: [phone]

## 2017-07-11 NOTE — Care Management Note (Signed)
Case Management Note  Patient Details  Name: Charles Orr MRN: [id number] Date of Birth: [birth year]  Subjective/Objective:  Pt presented for Chest Pain- Cath 07-11-17. Pt is traveling from [place] and he [place] planning to stay in [place]. Pt has Medicaid and will be able to afford any new medications.                   Action/Plan: CM did provide pt with List for Physicians Accepting New Patient's. The CSW did provide pt with shelter list as well. No further needs from CM at this time.   Expected Discharge Date:  07/11/17               Expected Discharge Plan:  Home/Self Care  In-House Referral:  Clinical Social Work(Shelter List)  Discharge planning Services  CM Consult, Other - See comment(Provided pt with list for Physicians Accepting New Patients. )  Post Acute Care Choice:  NA Choice offered to:  NA  DME Arranged:  N/A DME Agency:  NA  HH Arranged:  NA HH Agency:  NA  Status of Service:  Completed, signed off  If discussed at Long Length of Stay Meetings, dates discussed:    Additional Comments:  Gala LewandowskyGraves-Bigelow, Andie Mortimer Kaye, RN 07/11/2017, 11:37 AM

## 2017-07-11 NOTE — H&P (View-Only) (Signed)
Progress Note  Patient Name: Charles Orr Date of Encounter: 07/11/2017  Primary Cardiologist: (new) Tresa EndoKelly   Subjective   No chest pain, waiting for cath today.   Inpatient Medications    Scheduled Meds: . aspirin EC  325 mg Oral Daily  . atorvastatin  40 mg Oral Daily  . enoxaparin (LOVENOX) injection  40 mg Subcutaneous Q24H  . gabapentin  300 mg Oral BID  . hydrochlorothiazide  25 mg Oral Daily  . insulin aspart  0-15 Units Subcutaneous TID WC  . insulin aspart  0-5 Units Subcutaneous QHS  . insulin aspart protamine- aspart  45 Units Subcutaneous Q supper  . insulin aspart protamine- aspart  60 Units Subcutaneous Q breakfast  . lidocaine  1 patch Transdermal Daily  . metoprolol tartrate  25 mg Oral BID  . multivitamin with minerals  1 tablet Oral Daily  . nicotine  14 mg Transdermal Daily  . risperiDONE  1-2 mg Oral BID  . sertraline  50 mg Oral Daily  . sodium chloride flush  3 mL Intravenous Q12H   Continuous Infusions: . sodium chloride    . sodium chloride 1 mL/kg/hr (07/11/17 0704)   PRN Meds: sodium chloride, acetaminophen, gi cocktail, HYDROcodone-acetaminophen, hydrOXYzine, morphine injection, nitroGLYCERIN, ondansetron (ZOFRAN) IV, sodium chloride flush, traZODone, zolpidem   Vital Signs    Vitals:   07/10/17 1403 07/10/17 2052 07/11/17 0500 07/11/17 0823  BP: (!) 113/46 133/81 107/72 116/84  Pulse: 74 71 76 62  Resp: 18 18 18    Temp: 98.3 F (36.8 C) 98.4 F (36.9 C) 97.6 F (36.4 C)   TempSrc: Oral Oral Oral   SpO2: 100% 98% 98%   Weight:   249 lb 12.8 oz (113.3 kg)   Height:        Intake/Output Summary (Last 24 hours) at 07/11/2017 [id number] Last data filed at 07/11/2017 0703 Gross per 24 hour  Intake 1248 ml  Output -  Net 1248 ml   Filed Weights   07/09/17 1122 07/10/17 0500 07/11/17 0500  Weight: 254 lb 9.6 oz (115.5 kg) 253 lb 4.8 oz (114.9 kg) 249 lb 12.8 oz (113.3 kg)    Telemetry    SB - Personally Reviewed  Physical Exam    General: Well developed, well nourished, male appearing in no acute distress. Head: Normocephalic, atraumatic.  Neck: Supple without bruits, JVD. Lungs:  Resp regular and unlabored, CTA. Heart: RRR, S1, S2, no S3, S4, or murmur; no rub. Abdomen: Soft, non-tender, non-distended with normoactive bowel sounds. No hepatomegaly. No rebound/guarding. No obvious abdominal masses. Extremities: No clubbing, cyanosis, edema. Distal pedal pulses are 2+ bilaterally. Neuro: Alert and oriented X 3. Moves all extremities spontaneously. Psych: Normal affect.  Labs    Chemistry Recent Labs  Lab 07/07/17 0444 07/07/17 1716 07/09/17 0155 07/11/17 0327  NA 137 133* 136 138  K 4.1 4.5 3.6 3.7  CL 103 101 105 107  CO2 23 22 19* 25  GLUCOSE 257* 401* 175* 196*  BUN 15 17 11 13   CREATININE 1.10 1.21 1.12 1.05  CALCIUM 9.4 9.3 8.9 8.9  PROT 7.8 7.5  --   --   ALBUMIN 4.2 4.0  --   --   AST 21 20  --   --   ALT 26 26  --   --   ALKPHOS 73 87  --   --   BILITOT 0.8 0.7  --   --   GFRNONAA >60 >60 >60 >60  GFRAA >60 >60 >  60 >60  ANIONGAP 11 10 12 6      Hematology Recent Labs  Lab 07/07/17 0444 07/07/17 1716 07/09/17 0155  WBC 9.4 7.8 8.8  RBC 5.46 5.40 5.13  HGB 15.0 14.8 13.9  HCT 43.9 43.5 40.9  MCV 80.4 80.6 79.7  MCH 27.5 27.4 27.1  MCHC 34.2 34.0 34.0  RDW 13.6 13.9 13.4  PLT 243 233 215    Cardiac Enzymes Recent Labs  Lab 07/09/17 0155 07/09/17 0920 07/09/17 1438  TROPONINI <0.03 <0.03 <0.03    Recent Labs  Lab 07/09/17 0206  TROPIPOC 0.00     BNPNo results for input(s): BNP, PROBNP in the last 168 hours.   DDimer No results for input(s): DDIMER in the last 168 hours.    Radiology    Nm Myocar Multi W/spect W/wall Motion / Ef  Result Date: 07/10/2017 CLINICAL DATA:  Chest pain. History of MI. Smoker. Hypertension. Diabetes. Elevated cholesterol. Shortness of breath. Coronary artery disease. EXAM: MYOCARDIAL IMAGING WITH SPECT (REST AND PHARMACOLOGIC-STRESS)  GATED LEFT VENTRICULAR WALL MOTION STUDY LEFT VENTRICULAR EJECTION FRACTION TECHNIQUE: Standard myocardial SPECT imaging was performed after resting intravenous injection of 10 mCi Tc-5844m tetrofosmin. Subsequently, intravenous infusion of Lexiscan was performed under the supervision of the Cardiology staff. At peak effect of the drug, 30 mCi Tc-2244m tetrofosmin was injected intravenously and standard myocardial SPECT imaging was performed. Quantitative gated imaging was also performed to evaluate left ventricular wall motion, and estimate left ventricular ejection fraction. COMPARISON:  None. FINDINGS: Perfusion: There is diminished radiotracer activity on the rest and stress images involving the proximal mid and distal inferior wall. No abnormal areas of reversibility to suggest ischemia. Wall Motion: Normal left ventricular wall motion. No left ventricular dilation. Left Ventricular Ejection Fraction: 54 % End diastolic volume 114 ml End systolic volume 53 ml IMPRESSION: 1. No abnormal areas of inducible myocardial reversibility identified. There is a mild fixed defect involving the proximal, mid and distal inferior wall on both the rest and stress images. 2. Normal left ventricular wall motion. 3. Left ventricular ejection fraction 54% 4. Non invasive risk stratification*: Low *2012 Appropriate Use Criteria for Coronary Revascularization Focused Update: J Am Coll Cardiol. 2012;59(9):857-881. http://content.dementiazones.comonlinejacc.org/article.aspx?articleid=1201161 Electronically Signed   By: Signa Kellaylor  Stroud M.D.   On: 07/10/2017 14:35    Cardiac Studies   Cath: 07/10/17  Study Conclusions  - Left ventricle: The cavity size was normal. Wall thickness was   increased in a pattern of mild LVH. Systolic function was normal.   The estimated ejection fraction was in the range of 55% to 60%.   Wall motion was normal; there were no regional wall motion   abnormalities. Left ventricular diastolic function parameters   were  normal. - Aortic valve: There was no stenosis. - Mitral valve: There was no significant regurgitation. - Right ventricle: The cavity size was normal. Systolic function   was normal. - Pulmonary arteries: No complete TR doppler jet so unable to   estimate PA systolic pressure. - Inferior vena cava: The vessel was normal in size. The   respirophasic diameter changes were in the normal range (>= 50%),   consistent with normal central venous pressure.  Impressions:  - Normal LV size with mild LV hypertrophy. EF 55-60%. Normal   diastolic function. Normal RV size and systolic function. No   significant valvular abnormalities.   Patient Profile     56 y.o. male with a hx of DM-2, tobacco use, obesity, schizophrenia, chronic back painand CAD with stents placed  in 2012who is being seen for the evaluation ofchest pain. Presented for chest pain like someone kicking him in the chest. Negative troponins. EKG with sinus rhythm at 86 with non diagnostic inferior T changes.  Assessment & Plan    1. Chest pain , neg MI, hx of CAD. No further chest pain:  Nuc was reported as low risk, but reviewed by Dr. Tresa Endo and felt to have mild stress ischemia in the inferior wall. Planned for cardiac cath today.   2. Prior cath 2008; without significant disease  3. HLD on lipito 40  4. HTN 15 yrs history. Elevated once now controlled on lopressor 25 BID. Consider stopping HCTZ and adding ACEi/ARB instead with Hx of DM.     5. Tobacco use on nicoderm now, have recommended cessation   6. Schizoaffective disorder per IM  7. DM-2 insulin dependent per IMpoorly controlled, currently on 70/30 as outpatient. Reports being taken off metformin, bc PCP didn't Rx anymore? Consider adding additional agent post cath. Will have diabetes coordinator see for assistance.   Signed, Laverda Page, NP  07/11/2017, 9:07 AM  Pager # [phone]    Patient seen and examined. Agree with assessment and plan. Pt had  significant chest pain during nuclear study yesterday. No recurrent pain this am. For definitive cath today as discussed yesterday. Risks/benefits reviewed in detail yesterday.     Lennette Bihari, MD, [facility] 07/11/2017 9:31 AM   For questions or updates, please contact [facility] Please consult [email or web address] for contact info under Cardiology/STEMI. Daytime calls, contact the Day Call APP (6a-8a) or assigned team (Teams A-D) provider (7:30a - 5p). All other daytime calls (7:30-5p), contact the Card Master @ [phone].   Nighttime calls, contact the assigned APP (5p-8p) or MD (6:30p-8p). Overnight calls (8p-6a), contact the on call Fellow @ [phone].

## 2017-07-11 NOTE — Progress Notes (Signed)
CSW discussed with RN throughout the day about transportation options for patient. During assessment in the morning, patient indicated he would go to [place]. CSW offered support in getting to the bus station to catch a bus to [place].   CSW then informed that patient would not go to [place] and does not have shelter in [place]. CSW gave patient shelter list in the morning and encouraged patient to call for bed availability and also offered transport options if patient has a place to go in [place]. In the afternoon CSW informed by RN that patient plans to go to [place]. CSW unable to support with transport to [place].   CSW explained to patient again at bedside that CSW can help with transport in [place] and assessed for patient resources to get to his destination. Patient said he has a friend in [place] who can help him get to [place], but he is not allowed to go to that friend's house until tomorrow morning. CSW reminded patient that he has been discharged since this morning. Patient indicated he can't get anywhere tonight and would not discuss further with CSW. CSW left bus passes with RN for patient to use if leaving the hospital this evening.  CSW signing off.  Abigail ButtsSusan Aleisa Howk, LCSWA [phone]

## 2017-07-11 NOTE — Progress Notes (Addendum)
I visited with patient this morning. Patient was in his bed watching TV and waiting to be taken to cath lab. Patient expressed his concern and anxiety for the procedure coming up. Chaplain lead the patient into conversations around courage and positive thinking about the on-coming procedure. Patient was very receptive and appreciative about chaplain's visit. Chaplain also provded reflective listening and compassionate presence.  Chaplain M. Gannett CoHolley

## 2017-07-11 NOTE — Discharge Summary (Signed)
Physician Discharge Summary  Charles Orr UJW:[id number] DOB: Feb 19, 1961 DOA: 07/09/2017  [facility]  Admit date: 07/09/2017 Discharge date: 07/11/2017   Recommendations for Outpatient Follow-Up:   Patient to establish with PCP if stays in [place] -will need titration of 70/30 -per patient he was taken off metformin as it was causing more harm then help?? Will need to get records from [place]CP in [place] Tobacco cessation   Discharge Diagnosis:   Principal Problem:   Angina pectoris (HCC) Active Problems:   Hyperglycemia   Schizoaffective disorder, bipolar type (HCC)   Tobacco abuse   Hypertension   Poorly controlled diabetes mellitus (HCC)   Hyperlipidemia with target LDL less than 70   Chest pain   Discharge disposition:  Home  Discharge Condition: Improved.  Diet recommendation: Low sodium, heart healthy.  Carbohydrate-modified  Wound care: None.   History of Present Illness:   Charles PortaKarl J Orr is a 56 y.o. male who presents with CP of unknown etiology. H/o CAD, cath and stent placement in the setting of cocaine use. UDS pending and Cardiology consulted.    Hospital Course by Problem:   Chest pain -CE negative -low risk ST -cath clear:  1.  Patent coronary arteries with mild nonobstructive CAD 2.  Normal LVEDP  DM -titrate medications for tighter control -had been on metformin but did not tolerate -defer to PCP  Schizoaffective d/o -continue meds      Medical Consultants:    cards   Discharge Exam:   Vitals:   07/11/17 1006 07/11/17 1011  BP: 122/79 130/86  Pulse: 66 62  Resp: 17 15  Temp:    SpO2: 96% 96%   Vitals:   07/11/17 0956 07/11/17 1001 07/11/17 1006 07/11/17 1011  BP: 126/84 118/86 122/79 130/86  Pulse: 61 61 66 62  Resp: 19 16 17 15   Temp:      TempSrc:      SpO2: 97% 96% 96% 96%  Weight:      Height:        Gen:  NAD    The results of significant diagnostics from this  hospitalization (including imaging, microbiology, ancillary and laboratory) are listed below for reference.     Procedures and Diagnostic Studies:   Dg Chest 2 View  Result Date: 07/09/2017 CLINICAL DATA:  Lethargy and confusion. Central chest pain. Dizziness. EXAM: CHEST  2 VIEW COMPARISON:  06/10/2017 FINDINGS: Shallow inspiration with linear atelectasis in the lung bases, progressing since previous study. Mild cardiac enlargement. No vascular congestion. No edema. No blunting of costophrenic angles. No pneumothorax. Mediastinal contours appear intact. IMPRESSION: Shallow inspiration with linear atelectasis in the lung bases, progressing since previous study. Electronically Signed   By: Burman NievesWilliam  Stevens M.D.   On: 07/09/2017 02:44   Nm Myocar Multi W/spect W/wall Motion / Ef  Result Date: 07/10/2017 CLINICAL DATA:  Chest pain. History of MI. Smoker. Hypertension. Diabetes. Elevated cholesterol. Shortness of breath. Coronary artery disease. EXAM: MYOCARDIAL IMAGING WITH SPECT (REST AND PHARMACOLOGIC-STRESS) GATED LEFT VENTRICULAR WALL MOTION STUDY LEFT VENTRICULAR EJECTION FRACTION TECHNIQUE: Standard myocardial SPECT imaging was performed after resting intravenous injection of 10 mCi Tc-2424m tetrofosmin. Subsequently, intravenous infusion of Lexiscan was performed under the supervision of the Cardiology staff. At peak effect of the drug, 30 mCi Tc-5624m tetrofosmin was injected intravenously and standard myocardial SPECT imaging was performed. Quantitative gated imaging was also performed to evaluate left ventricular wall motion, and estimate left ventricular ejection fraction. COMPARISON:  None. FINDINGS: Perfusion: There  is diminished radiotracer activity on the rest and stress images involving the proximal mid and distal inferior wall. No abnormal areas of reversibility to suggest ischemia. Wall Motion: Normal left ventricular wall motion. No left ventricular dilation. Left Ventricular Ejection  Fraction: 54 % End diastolic volume 114 ml End systolic volume 53 ml IMPRESSION: 1. No abnormal areas of inducible myocardial reversibility identified. There is a mild fixed defect involving the proximal, mid and distal inferior wall on both the rest and stress images. 2. Normal left ventricular wall motion. 3. Left ventricular ejection fraction 54% 4. Non invasive risk stratification*: Low *2012 Appropriate Use Criteria for Coronary Revascularization Focused Update: J Am Coll Cardiol. 2012;59(9):857-881. http://content.dementiazones.comonlinejacc.org/article.aspx?articleid=1201161 Electronically Signed   By: Signa Kellaylor  Stroud M.D.   On: 07/10/2017 14:35     Labs:   Basic Metabolic Panel: Recent Labs  Lab 07/07/17 0444 07/07/17 1716 07/07/17 1800 07/09/17 0155 07/11/17 0327  NA 137 133*  --  136 138  K 4.1 4.5  --  3.6 3.7  CL 103 101  --  105 107  CO2 23 22  --  19* 25  GLUCOSE 257* 401*  --  175* 196*  BUN 15 17  --  11 13  CREATININE 1.10 1.21  --  1.12 1.05  CALCIUM 9.4 9.3  --  8.9 8.9  MG  --   --  2.3  --   --    GFR Estimated Creatinine Clearance: 106.4 mL/min (by C-G formula based on SCr of 1.05 mg/dL). Liver Function Tests: Recent Labs  Lab 07/07/17 0444 07/07/17 1716  AST 21 20  ALT 26 26  ALKPHOS 73 87  BILITOT 0.8 0.7  PROT 7.8 7.5  ALBUMIN 4.2 4.0   Recent Labs  Lab 07/07/17 1716  LIPASE 27   No results for input(s): AMMONIA in the last 168 hours. Coagulation profile Recent Labs  Lab 07/07/17 1800  INR 1.10    CBC: Recent Labs  Lab 07/07/17 0444 07/07/17 1716 07/09/17 0155  WBC 9.4 7.8 8.8  HGB 15.0 14.8 13.9  HCT 43.9 43.5 40.9  MCV 80.4 80.6 79.7  PLT 243 233 215   Cardiac Enzymes: Recent Labs  Lab 07/09/17 0155 07/09/17 0920 07/09/17 1438  TROPONINI <0.03 <0.03 <0.03   BNP: Invalid input(s): POCBNP CBG: Recent Labs  Lab 07/09/17 2112 07/10/17 1213 07/10/17 1628 07/10/17 2111 07/11/17 0804  GLUCAP 244* 263* 326* 174* 206*   D-Dimer No  results for input(s): DDIMER in the last 72 hours. Hgb A1c Recent Labs    07/09/17 0920  HGBA1C 10.0*   Lipid Profile No results for input(s): CHOL, HDL, LDLCALC, TRIG, CHOLHDL, LDLDIRECT in the last 72 hours. Thyroid function studies No results for input(s): TSH, T4TOTAL, T3FREE, THYROIDAB in the last 72 hours.  Invalid input(s): FREET3 Anemia work up No results for input(s): VITAMINB12, FOLATE, FERRITIN, TIBC, IRON, RETICCTPCT in the last 72 hours. Microbiology No results found for this or any previous visit (from the past 240 hour(s)).   Discharge Instructions:   Discharge Instructions    Diet - low sodium heart healthy   Complete by:  As directed    Diet Carb Modified   Complete by:  As directed    Discharge instructions   Complete by:  As directed    Monitor blood sugars and bring to PCP   Increase activity slowly   Complete by:  As directed      Allergies as of 07/11/2017   No Known Allergies  Medication List    STOP taking these medications   HYDROcodone-acetaminophen 5-325 MG tablet Commonly known as:  NORCO/VICODIN     TAKE these medications   acetaminophen 500 MG tablet Commonly known as:  TYLENOL Take 1,000 mg by mouth every 6 (six) hours as needed for moderate pain.   aspirin 81 MG EC tablet Take 1 tablet (81 mg total) by mouth daily. For heart health   atorvastatin 40 MG tablet Commonly known as:  LIPITOR Take 1 tablet (40 mg total) by mouth daily. For high Cholesterol   gabapentin 300 MG capsule Commonly known as:  NEURONTIN Take 1 capsule (300 mg total) by mouth 2 (two) times daily. For agitation   hydrochlorothiazide 25 MG tablet Commonly known as:  HYDRODIURIL Take 1 tablet (25 mg total) by mouth daily. For high blood pressure   hydrOXYzine 25 MG tablet Commonly known as:  ATARAX/VISTARIL Take 1 tablet (25 mg total) by mouth 3 (three) times daily as needed for anxiety.   insulin aspart protamine- aspart (70-30) 100 UNIT/ML  injection Commonly known as:  NOVOLOG MIX 70/30 Inject 0.45-0.6 mLs (45-60 Units total) 2 (two) times daily with a meal into the skin. 60 units in the morning and 45 units at dinner time What changed:    how much to take  when to take this  additional instructions  Another medication with the same name was removed. Continue taking this medication, and follow the directions you see here.   lidocaine 5 % Commonly known as:  LIDODERM Place 1 patch onto the skin daily. Remove & Discard patch within 12 hours or as directed by MD: For pain management   metoprolol tartrate 25 MG tablet Commonly known as:  LOPRESSOR Take 1 tablet (25 mg total) by mouth 2 (two) times daily. For high blood pressure   multivitamin with minerals Tabs tablet Take 1 tablet by mouth daily. For Vitamin supplement   nicotine 14 mg/24hr patch Commonly known as:  NICODERM CQ - dosed in mg/24 hours Place 1 patch (14 mg total) onto the skin daily. (May purchase over the counter): For smoking cessation   nitroGLYCERIN 0.4 MG SL tablet Commonly known as:  NITROSTAT Place 1 tablet (0.4 mg total) every 5 (five) minutes as needed under the tongue for chest pain.   risperiDONE 1 MG tablet Commonly known as:  RISPERDAL Take 1 tablet (1 mg) by mouth in the mornings & 2 tablets (2 mg) at bedtime: For mood control What changed:    how much to take  how to take this  when to take this  additional instructions   sertraline 50 MG tablet Commonly known as:  ZOLOFT Take 1 tablet (50 mg total) by mouth daily. For depression   traZODone 100 MG tablet Commonly known as:  DESYREL Take 1 tablet (100 mg total) by mouth at bedtime as needed for sleep.      Follow-up Information    [facility] Follow up in 1 week(s).   Why:  or establish with new PCP in [facility] if staying here Contact information: [address] [place] [address] [phone]            Time  coordinating discharge: 35 min  Signed:  Prisca Gearing U Willia Genrich   [facility] 07/11/2017, 11:33 AM

## 2017-07-11 NOTE — Progress Notes (Signed)
 Progress Note  Patient Name: Charles Orr Date of Encounter: 07/11/2017  Primary Cardiologist: (new) Kelly   Subjective   No chest pain, waiting for cath today.   Inpatient Medications    Scheduled Meds: . aspirin EC  325 mg Oral Daily  . atorvastatin  40 mg Oral Daily  . enoxaparin (LOVENOX) injection  40 mg Subcutaneous Q24H  . gabapentin  300 mg Oral BID  . hydrochlorothiazide  25 mg Oral Daily  . insulin aspart  0-15 Units Subcutaneous TID WC  . insulin aspart  0-5 Units Subcutaneous QHS  . insulin aspart protamine- aspart  45 Units Subcutaneous Q supper  . insulin aspart protamine- aspart  60 Units Subcutaneous Q breakfast  . lidocaine  1 patch Transdermal Daily  . metoprolol tartrate  25 mg Oral BID  . multivitamin with minerals  1 tablet Oral Daily  . nicotine  14 mg Transdermal Daily  . risperiDONE  1-2 mg Oral BID  . sertraline  50 mg Oral Daily  . sodium chloride flush  3 mL Intravenous Q12H   Continuous Infusions: . sodium chloride    . sodium chloride 1 mL/kg/hr (07/11/17 0704)   PRN Meds: sodium chloride, acetaminophen, gi cocktail, HYDROcodone-acetaminophen, hydrOXYzine, morphine injection, nitroGLYCERIN, ondansetron (ZOFRAN) IV, sodium chloride flush, traZODone, zolpidem   Vital Signs    Vitals:   07/10/17 1403 07/10/17 2052 07/11/17 0500 07/11/17 0823  BP: (!) 113/46 133/81 107/72 116/84  Pulse: 74 71 76 62  Resp: 18 18 18   Temp: 98.3 F (36.8 C) 98.4 F (36.9 C) 97.6 F (36.4 C)   TempSrc: Oral Oral Oral   SpO2: 100% 98% 98%   Weight:   249 lb 12.8 oz (113.3 kg)   Height:        Intake/Output Summary (Last 24 hours) at 07/11/2017 0907 Last data filed at 07/11/2017 0703 Gross per 24 hour  Intake 1248 ml  Output -  Net 1248 ml   Filed Weights   07/09/17 1122 07/10/17 0500 07/11/17 0500  Weight: 254 lb 9.6 oz (115.5 kg) 253 lb 4.8 oz (114.9 kg) 249 lb 12.8 oz (113.3 kg)    Telemetry    SB - Personally Reviewed  Physical Exam    General: Well developed, well nourished, male appearing in no acute distress. Head: Normocephalic, atraumatic.  Neck: Supple without bruits, JVD. Lungs:  Resp regular and unlabored, CTA. Heart: RRR, S1, S2, no S3, S4, or murmur; no rub. Abdomen: Soft, non-tender, non-distended with normoactive bowel sounds. No hepatomegaly. No rebound/guarding. No obvious abdominal masses. Extremities: No clubbing, cyanosis, edema. Distal pedal pulses are 2+ bilaterally. Neuro: Alert and oriented X 3. Moves all extremities spontaneously. Psych: Normal affect.  Labs    Chemistry Recent Labs  Lab 07/07/17 0444 07/07/17 1716 07/09/17 0155 07/11/17 0327  NA 137 133* 136 138  K 4.1 4.5 3.6 3.7  CL 103 101 105 107  CO2 23 22 19* 25  GLUCOSE 257* 401* 175* 196*  BUN 15 17 11 13  CREATININE 1.10 1.21 1.12 1.05  CALCIUM 9.4 9.3 8.9 8.9  PROT 7.8 7.5  --   --   ALBUMIN 4.2 4.0  --   --   AST 21 20  --   --   ALT 26 26  --   --   ALKPHOS 73 87  --   --   BILITOT 0.8 0.7  --   --   GFRNONAA >60 >60 >60 >60  GFRAA >60 >60 >  60 >60  ANIONGAP 11 10 12 6     Hematology Recent Labs  Lab 07/07/17 0444 07/07/17 1716 07/09/17 0155  WBC 9.4 7.8 8.8  RBC 5.46 5.40 5.13  HGB 15.0 14.8 13.9  HCT 43.9 43.5 40.9  MCV 80.4 80.6 79.7  MCH 27.5 27.4 27.1  MCHC 34.2 34.0 34.0  RDW 13.6 13.9 13.4  PLT 243 233 215    Cardiac Enzymes Recent Labs  Lab 07/09/17 0155 07/09/17 0920 07/09/17 1438  TROPONINI <0.03 <0.03 <0.03    Recent Labs  Lab 07/09/17 0206  TROPIPOC 0.00     BNPNo results for input(s): BNP, PROBNP in the last 168 hours.   DDimer No results for input(s): DDIMER in the last 168 hours.    Radiology    Nm Myocar Multi W/spect W/wall Motion / Ef  Result Date: 07/10/2017 CLINICAL DATA:  Chest pain. History of MI. Smoker. Hypertension. Diabetes. Elevated cholesterol. Shortness of breath. Coronary artery disease. EXAM: MYOCARDIAL IMAGING WITH SPECT (REST AND PHARMACOLOGIC-STRESS)  GATED LEFT VENTRICULAR WALL MOTION STUDY LEFT VENTRICULAR EJECTION FRACTION TECHNIQUE: Standard myocardial SPECT imaging was performed after resting intravenous injection of 10 mCi Tc-99m tetrofosmin. Subsequently, intravenous infusion of Lexiscan was performed under the supervision of the Cardiology staff. At peak effect of the drug, 30 mCi Tc-99m tetrofosmin was injected intravenously and standard myocardial SPECT imaging was performed. Quantitative gated imaging was also performed to evaluate left ventricular wall motion, and estimate left ventricular ejection fraction. COMPARISON:  None. FINDINGS: Perfusion: There is diminished radiotracer activity on the rest and stress images involving the proximal mid and distal inferior wall. No abnormal areas of reversibility to suggest ischemia. Wall Motion: Normal left ventricular wall motion. No left ventricular dilation. Left Ventricular Ejection Fraction: 54 % End diastolic volume 114 ml End systolic volume 53 ml IMPRESSION: 1. No abnormal areas of inducible myocardial reversibility identified. There is a mild fixed defect involving the proximal, mid and distal inferior wall on both the rest and stress images. 2. Normal left ventricular wall motion. 3. Left ventricular ejection fraction 54% 4. Non invasive risk stratification*: Low *2012 Appropriate Use Criteria for Coronary Revascularization Focused Update: J Am Coll Cardiol. 2012;59(9):857-881. http://content.onlinejacc.org/article.aspx?articleid=[email or web address] Electronically Signed   By: Taylor  Stroud M.D.   On: 07/10/2017 14:35    Cardiac Studies   Cath: 07/10/17  Study Conclusions  - Left ventricle: The cavity size was normal. Wall thickness was   increased in a pattern of mild LVH. Systolic function was normal.   The estimated ejection fraction was in the range of 55% to 60%.   Wall motion was normal; there were no regional wall motion   abnormalities. Left ventricular diastolic function parameters   were  normal. - Aortic valve: There was no stenosis. - Mitral valve: There was no significant regurgitation. - Right ventricle: The cavity size was normal. Systolic function   was normal. - Pulmonary arteries: No complete TR doppler jet so unable to   estimate PA systolic pressure. - Inferior vena cava: The vessel was normal in size. The   respirophasic diameter changes were in the normal range (>= 50%),   consistent with normal central venous pressure.  Impressions:  - Normal LV size with mild LV hypertrophy. EF 55-60%. Normal   diastolic function. Normal RV size and systolic function. No   significant valvular abnormalities.   Patient Profile     55 y.o. male with a hx of DM-2, tobacco use, obesity, schizophrenia, chronic back painand CAD with stents placed   in 2012who is being seen for the evaluation ofchest pain. Presented for chest pain like someone kicking him in the chest. Negative troponins. EKG with sinus rhythm at 86 with non diagnostic inferior T changes.  Assessment & Plan    1. Chest pain , neg MI, hx of CAD. No further chest pain:  Nuc was reported as low risk, but reviewed by Dr. Kelly and felt to have mild stress ischemia in the inferior wall. Planned for cardiac cath today.   2. Prior cath 2008; without significant disease  3. HLD on lipito 40  4. HTN 15 yrs history. Elevated once now controlled on lopressor 25 BID. Consider stopping HCTZ and adding ACEi/ARB instead with Hx of DM.     5. Tobacco use on nicoderm now, have recommended cessation   6. Schizoaffective disorder per IM  7. DM-2 insulin dependent per IMpoorly controlled, currently on 70/30 as outpatient. Reports being taken off metformin, bc PCP didn't Rx anymore? Consider adding additional agent post cath. Will have diabetes coordinator see for assistance.   Signed, Lindsay Roberts, NP  07/11/2017, 9:07 AM  Pager # [phone]    Patient seen and examined. Agree with assessment and plan. Pt had  significant chest pain during nuclear study yesterday. No recurrent pain this am. For definitive cath today as discussed yesterday. Risks/benefits reviewed in detail yesterday.     Thomas A. Kelly, MD, FACC 07/11/2017 9:31 AM   For questions or updates, please contact [facility] Please consult [email or web address] for contact info under Cardiology/STEMI. Daytime calls, contact the Day Call APP (6a-8a) or assigned team (Teams A-D) provider (7:30a - 5p). All other daytime calls (7:30-5p), contact the Card Master @ [phone].   Nighttime calls, contact the assigned APP (5p-8p) or MD (6:30p-8p). Overnight calls (8p-6a), contact the on call Fellow @ [phone].    

## 2017-07-23 ENCOUNTER — Ambulatory Visit (INDEPENDENT_AMBULATORY_CARE_PROVIDER_SITE_OTHER): Payer: Self-pay | Admitting: Orthopaedic Surgery

## 2017-07-28 ENCOUNTER — Emergency Department (HOSPITAL_COMMUNITY)
Admission: EM | Admit: 2017-07-28 | Discharge: 2017-07-28 | Disposition: A | Discharge status: Home / Self Care | Payer: Medicaid Other | Attending: Emergency Medicine | Admitting: Emergency Medicine

## 2017-07-28 ENCOUNTER — Other Ambulatory Visit: Payer: Self-pay

## 2017-07-28 ENCOUNTER — Encounter (HOSPITAL_COMMUNITY): Payer: Self-pay

## 2017-07-28 DIAGNOSIS — Z79899 Other long term (current) drug therapy: Secondary | ICD-10-CM | POA: Diagnosis not present

## 2017-07-28 DIAGNOSIS — Z794 Long term (current) use of insulin: Secondary | ICD-10-CM | POA: Diagnosis not present

## 2017-07-28 DIAGNOSIS — E1165 Type 2 diabetes mellitus with hyperglycemia: Secondary | ICD-10-CM | POA: Insufficient documentation

## 2017-07-28 DIAGNOSIS — Z7982 Long term (current) use of aspirin: Secondary | ICD-10-CM | POA: Insufficient documentation

## 2017-07-28 DIAGNOSIS — F17210 Nicotine dependence, cigarettes, uncomplicated: Secondary | ICD-10-CM | POA: Insufficient documentation

## 2017-07-28 DIAGNOSIS — Z9114 Patient's other noncompliance with medication regimen: Secondary | ICD-10-CM | POA: Insufficient documentation

## 2017-07-28 DIAGNOSIS — R739 Hyperglycemia, unspecified: Secondary | ICD-10-CM

## 2017-07-28 DIAGNOSIS — I10 Essential (primary) hypertension: Secondary | ICD-10-CM | POA: Diagnosis not present

## 2017-07-28 LAB — URINALYSIS, ROUTINE W REFLEX MICROSCOPIC
BACTERIA UA: NONE SEEN
BILIRUBIN URINE: NEGATIVE
Glucose, UA: >=500 mg/dL — AB
KETONES UR: NEGATIVE mg/dL
LEUKOCYTES UA: NEGATIVE
NITRITE: NEGATIVE
PH: 5 (ref 5.0–8.0)
Protein, ur: NEGATIVE mg/dL
Specific Gravity, Urine: 1.021 (ref 1.005–1.030)
Squamous Epithelial / LPF: NONE SEEN
WBC UA: NONE SEEN WBC/hpf (ref 0–5)

## 2017-07-28 LAB — CBC
HCT: 43.8 % (ref 39.0–52.0)
Hemoglobin: 14.7 g/dL (ref 13.0–17.0)
MCH: 27.4 pg (ref 26.0–34.0)
MCHC: 33.6 g/dL (ref 30.0–36.0)
MCV: 81.6 fL (ref 78.0–100.0)
PLATELETS: 266 10*3/uL (ref 150–400)
RBC: 5.37 MIL/uL (ref 4.22–5.81)
RDW: 13.6 % (ref 11.5–15.5)
WBC: 8.6 10*3/uL (ref 4.0–10.5)

## 2017-07-28 LAB — COMPREHENSIVE METABOLIC PANEL
ALT: 35 U/L (ref 17–63)
ANION GAP: 11 (ref 5–15)
AST: 22 U/L (ref 15–41)
Albumin: 4 g/dL (ref 3.5–5.0)
Alkaline Phosphatase: 99 U/L (ref 38–126)
BUN: 18 mg/dL (ref 6–20)
CHLORIDE: 97 mmol/L — AB (ref 101–111)
CO2: 24 mmol/L (ref 22–32)
Calcium: 9.3 mg/dL (ref 8.9–10.3)
Creatinine, Ser: 1.27 mg/dL — ABNORMAL HIGH (ref 0.61–1.24)
GFR calc non Af Amer: >60 mL/min (ref >60)
Glucose, Bld: 408 mg/dL — ABNORMAL HIGH (ref 65–99)
POTASSIUM: 3.9 mmol/L (ref 3.5–5.1)
SODIUM: 132 mmol/L — AB (ref 135–145)
Total Bilirubin: 0.5 mg/dL (ref 0.3–1.2)
Total Protein: 7.4 g/dL (ref 6.5–8.1)

## 2017-07-28 LAB — CBG MONITORING, ED
GLUCOSE-CAPILLARY: 280 mg/dL — AB (ref 65–99)
Glucose-Capillary: 387 mg/dL — ABNORMAL HIGH (ref 65–99)

## 2017-07-28 MED ORDER — INSULIN ASPART PROT & ASPART (70-30 MIX) 100 UNIT/ML ~~LOC~~ SUSP: 45.0000 [IU] | Freq: Two times a day (BID) | SUBCUTANEOUS | 0 refills | Status: DC

## 2017-07-28 MED ORDER — SODIUM CHLORIDE 0.9 % IV BOLUS (SEPSIS)
2000.0000 mL | Freq: Once | INTRAVENOUS | Status: AC
  Administered 2017-07-28: 2000 mL via INTRAVENOUS

## 2017-07-28 MED ORDER — INSULIN ASPART 100 UNIT/ML ~~LOC~~ SOLN
18.0000 [IU] | Freq: Once | SUBCUTANEOUS | Status: AC
  Administered 2017-07-28: 18 [IU] via SUBCUTANEOUS
  Filled 2017-07-28: qty 1

## 2017-07-28 MED ORDER — INSULIN ASPART PROT & ASPART (70-30 MIX) 100 UNIT/ML ~~LOC~~ SUSP
45.0000 [IU] | Freq: Once | SUBCUTANEOUS | Status: AC
  Administered 2017-07-28: 45 [IU] via SUBCUTANEOUS
  Filled 2017-07-28: qty 10

## 2017-07-28 NOTE — Clinical Social Work Note (Signed)
SW met with patient to assess for services.  Patient reported that [facility] had [facility] and he could remain for awhile.  Patient reported that he had left his back pack ina taxi cab which why he did not have his insulin.  Patient reported that he needs a new primary care provider and case manager is referring to [facility] for patient to follow up.  SW will make sure patient gets back to his shelter today with taxi voucher.

## 2017-07-28 NOTE — Care Management Note (Addendum)
Case Management Note  Patient Details  Name: Charles Orr MRN: [id number] Date of Birth: [birth year]  Subjective/Objective:   DM, noncompliance with medications                   Action/Plan: Discharge Planning: Pt with a recent hospitalization and info was given on contacting a PCP for follow up post dc. Pt states he did not follow up with a PCP. NCM provided with information on [facility] to call on Monday and get a follow up appt. Pt states he will not be able to pick up meds until he receives his check on Friday. Explained he can discuss with pharmacist at [facility] about a copay waiver for meds. Pt qualifies because he currently has Medicaid. CSW to provided pt with bus passes to get to pharmacy and back to shelter.    Expected Discharge Date:  07/28/2017              Expected Discharge Plan:  Homeless Shelter  In-House Referral:  Clinical Social Work  Discharge planning Services  CM Consult, Medication Assistance, [facility]  Post Acute Care Choice:  NA Choice offered to:  NA  DME Arranged:  N/A DME Agency:  NA  HH Arranged:  NA HH Agency:  NA  Status of Service:  Completed, signed off  If discussed at [employer] Meetings, dates discussed:    Additional Comments:  Charles Orr, Charles Spinello Ellen, RN 07/28/2017, 4:45 PM

## 2017-07-28 NOTE — Discharge Instructions (Signed)
Get your prescription filled and start taking the insulin, tomorrow morning.  Make sure that you are drinking plenty of water each day to improve your blood sugar, and make sure you are avoiding concentrated sweets which can raise your blood sugar.

## 2017-07-28 NOTE — ED Triage Notes (Signed)
Patient presented here from the homeless  shelter with c/o hyperglycemia. Patient state he been out of insulin for 3 days. Pt c/o nausea and vomiting. Blood pressure was high 180/102 HR 170.Pt was given 4 mg of Zofran and 250 NS. BP given fluids 148/96, blood sugar 460. Upon arrival to ed blood sugar check is 387.

## 2017-07-28 NOTE — ED Provider Notes (Signed)
[facility]-EMERGENCY DEPT Provider Note   CSN: [id number] Arrival date & time: 07/28/17  1248     History   Chief Complaint No chief complaint on file.   HPI Charles Orr is a 56 y.o. male.  Patient presents for evaluation of suspected hyperglycemia secondary to not taking his insulin for 3 days.  He is currently staying at the homeless shelter, and was recently discharged from the hospital.  During the hospitalization he had cardiac catheterization which showed nonobstructive coronary disease, and he was recommended to follow his blood sugar closely and follow-up with a primary care doctor for titration of insulin if needed.  Patient denies fever, chills, weakness or dizziness.  There are no other known modifying factors.  HPI  Past Medical History:  Diagnosis Date  . Anxiety   . Arthritis    "back" (06/12/2017)  . Bipolar disorder (HCC)   . CAP (community acquired pneumonia) 06/10/2017  . Chronic lower back pain   . Degenerative disc disease, lumbar   . Depression   . Fibromyalgia   . GERD (gastroesophageal reflux disease)   . High cholesterol   . Hypertension   . Myocardial infarction (HCC) 2002  . Pneumonia 1989  . Schizophrenia (HCC)   . Type II diabetes mellitus [facility](HCC)     Patient Active Problem List   Diagnosis Date Noted  . Chest pain 07/10/2017  . Angina pectoris (HCC) 07/09/2017  . Tobacco abuse 07/09/2017  . Hypertension 07/09/2017  . Type II diabetes mellitus (HCC)   . GERD (gastroesophageal reflux disease)   . Chronic lower back pain   . Poorly controlled diabetes mellitus (HCC)   . Hyperlipidemia with target LDL less than 70   . Schizoaffective disorder, bipolar type (HCC) 06/16/2017  . Depression 06/15/2017  . Other schizophrenia (HCC)   . Pneumonia 06/11/2017  . Community acquired pneumonia of right lower lobe of lung (HCC)   . Hyperglycemia   . Localized edema   . Shortness of breath 06/10/2017    Past Surgical History:   Procedure Laterality Date  . CARDIAC CATHETERIZATION  12/2006   [name]/notes 01/18/2011  . INGUINAL HERNIA REPAIR Right   . LEFT HEART CATH AND CORONARY ANGIOGRAPHY N/A 07/11/2017   Procedure: LEFT HEART CATH AND CORONARY ANGIOGRAPHY;  Surgeon: Tonny Bollmanooper, Michael, MD;  [facility];  Service: Cardiovascular;  Laterality: N/A;  . PARATHYROIDECTOMY    . PERICARDIAL TAP  2000   ?; in [place]/notes 01/18/2011       Home Medications    Prior to Admission medications   Medication Sig Start Date End Date Taking? Authorizing Provider  acetaminophen (TYLENOL) 500 MG tablet Take 1,000 mg by mouth every 6 (six) hours as needed for moderate pain.   Yes [provider]  aspirin 81 MG EC tablet Take 1 tablet (81 mg total) by mouth daily. For heart health 06/26/17  Yes Armandina StammerNwoko, Agnes I, NP  atorvastatin (LIPITOR) 40 MG tablet Take 1 tablet (40 mg total) by mouth daily. For high Cholesterol 06/26/17  Yes Armandina StammerNwoko, Agnes I, NP  gabapentin (NEURONTIN) 300 MG capsule Take 1 capsule (300 mg total) by mouth 2 (two) times daily. For agitation 06/26/17  Yes Nwoko, Nicole KindredAgnes I, NP  hydrochlorothiazide (HYDRODIURIL) 25 MG tablet Take 1 tablet (25 mg total) by mouth daily. For high blood pressure 06/26/17  Yes Nwoko, Agnes I, NP  hydrOXYzine (ATARAX/VISTARIL) 25 MG tablet Take 1 tablet (25 mg total) by mouth 3 (three) times daily as  needed for anxiety. 06/26/17  Yes Armandina StammerNwoko, Agnes I, NP  metoprolol tartrate (LOPRESSOR) 25 MG tablet Take 1 tablet (25 mg total) by mouth 2 (two) times daily. For high blood pressure 06/26/17  Yes Nwoko, Nicole KindredAgnes I, NP  Multiple Vitamin (MULTIVITAMIN WITH MINERALS) TABS tablet Take 1 tablet by mouth daily. For Vitamin supplement 06/26/17  Yes Nwoko, Nicole KindredAgnes I, NP  risperiDONE (RISPERDAL) 1 MG tablet Take 1 tablet (1 mg) by mouth in the mornings & 2 tablets (2 mg) at bedtime: For mood control Patient taking differently: Take 1-2 mg by mouth 2 (two) times daily. Take 1 tablet (1 mg) by  mouth in the mornings & 2 tablets (2 mg) at bedtime: For mood control 06/26/17  Yes Nwoko, Nicole KindredAgnes I, NP  sertraline (ZOLOFT) 50 MG tablet Take 1 tablet (50 mg total) by mouth daily. For depression 06/27/17  Yes Armandina StammerNwoko, Agnes I, NP  traZODone (DESYREL) 100 MG tablet Take 1 tablet (100 mg total) by mouth at bedtime as needed for sleep. 06/26/17  Yes Armandina StammerNwoko, Agnes I, NP  insulin aspart protamine- aspart (NOVOLOG MIX 70/30) (70-30) 100 UNIT/ML injection Inject 0.45-0.6 mLs (45-60 Units total) into the skin 2 (two) times daily with a meal. 60 units in the morning and 45 units at dinner time 07/28/17   Mancel BaleWentz, Rosita Guzzetta, MD  lidocaine (LIDODERM) 5 % Place 1 patch onto the skin daily. Remove & Discard patch within 12 hours or as directed by MD: For pain management Patient not taking: Reported on 07/07/2017 06/27/17   Armandina StammerNwoko, Agnes I, NP  nicotine (NICODERM CQ - DOSED IN MG/24 HOURS) 14 mg/24hr patch Place 1 patch (14 mg total) onto the skin daily. (May purchase over the counter): For smoking cessation Patient not taking: Reported on 07/07/2017 06/27/17   Armandina StammerNwoko, Agnes I, NP  nitroGLYCERIN (NITROSTAT) 0.4 MG SL tablet Place 1 tablet (0.4 mg total) every 5 (five) minutes as needed under the tongue for chest pain. Patient not taking: Reported on 07/28/2017 07/11/17   Joseph ArtVann, Jessica U, DO    Family History Family History  Problem Relation Age of Onset  . Hypertension Mother   . Diabetes Mother   . Heart disease Father   . Hypertension Sister   . Diabetes Sister   . Hypertension Brother   . Diabetes Brother   . Hypertension Sister   . Diabetes Sister   . Hypertension Brother   . Diabetes Brother     Social History Social History   Tobacco Use  . Smoking status: Current Every Day Smoker    Packs/day: 0.50    Years: 38.00    Pack years: 19.00    Types: Cigarettes  . Smokeless tobacco: Current User    Types: Chew  Substance Use Topics  . Alcohol use: Yes    Comment: 06/12/2017 "stopped ~ 1 yr ago"   .  Drug use: Yes    Types: Cocaine    Comment: 06/12/2017 "stopped using cocaine ~ 1 yr ago; used marijuana when I was younger" last cocaine use 9 mths ago per pt on 06/15/17     Allergies   Patient has no known allergies.   Review of Systems Review of Systems  All other systems reviewed and are negative.    Physical Exam Updated Vital Signs There were no vitals taken for this visit.  Physical Exam  Constitutional: He is oriented to person, place, and time. He appears well-developed and well-nourished.  HENT:  Head: Normocephalic and atraumatic.  Right Ear: External ear  normal.  Left Ear: External ear normal.  Eyes: Conjunctivae and EOM are normal. Pupils are equal, round, and reactive to light.  Neck: Normal range of motion and phonation normal. Neck supple.  Cardiovascular: Normal rate, regular rhythm and normal heart sounds.  Pulmonary/Chest: Effort normal and breath sounds normal. He exhibits no bony tenderness.  Abdominal: Soft. There is no tenderness.  Musculoskeletal: Normal range of motion.  Neurological: He is alert and oriented to person, place, and time. No cranial nerve deficit or sensory deficit. He exhibits normal muscle tone. Coordination normal.  Skin: Skin is warm, dry and intact.  Psychiatric: He has a normal mood and affect. His behavior is normal. Judgment and thought content normal.  Nursing note and vitals reviewed.    ED Treatments / Results  Labs (all labs ordered are listed, but only abnormal results are displayed) Labs Reviewed  COMPREHENSIVE METABOLIC PANEL - Abnormal; Notable for the following components:      Result Value   Sodium 132 (*)    Chloride 97 (*)    Glucose, Bld 408 (*)    Creatinine, Ser 1.27 (*)    All other components within normal limits  URINALYSIS, ROUTINE W REFLEX MICROSCOPIC - Abnormal; Notable for the following components:   Color, Urine STRAW (*)    Glucose, UA >=500 (*)    Hgb urine dipstick SMALL (*)    All other  components within normal limits  CBG MONITORING, ED - Abnormal; Notable for the following components:   Glucose-Capillary 387 (*)    All other components within normal limits  CBG MONITORING, ED - Abnormal; Notable for the following components:   Glucose-Capillary 280 (*)    All other components within normal limits  CBC    EKG  EKG Interpretation None       Radiology No results found.  Procedures Procedures (including critical care time)  Medications Ordered in ED Medications  sodium chloride 0.9 % bolus 2,000 mL (0 mLs Intravenous Stopped 07/28/17 1559)  insulin aspart (novoLOG) injection 18 Units (18 Units Subcutaneous Given 07/28/17 1339)  insulin aspart protamine- aspart (NOVOLOG MIX 70/30) injection 45 Units (45 Units Subcutaneous Given 07/28/17 1559)     Initial Impression / Assessment and Plan / ED Course  I have reviewed the triage vital signs and the nursing notes.  Pertinent labs & imaging results that were available during my care of the patient were reviewed by me and considered in my medical decision making (see chart for details).  Clinical Course as of Jul 28 1629  Sat Jul 28, 2017  1628 Slightly low Sodium: (!) 132 [EW]  1628 Elevated Glucose: (!) 408 [EW]  1628 Normal Anion gap: 11 [EW]  1628 Normal WBC: 8.6 [EW]  1628 Normal WBC, UA: NONE SEEN [EW]    Clinical Course User Index [EW] Mancel Bale, MD     No data found.  4:30 PM Reevaluation with update and discussion. After initial assessment and treatment, an updated evaluation reveals patient is comfortable.  Findings discussed and questions answered. Mancel Bale       Final Clinical Impressions(s) / ED Diagnoses   Final diagnoses:  Hyperglycemia  Noncompliance with medications   Glycemia without ketosis related to noncompliance with medication therapy.  Doubt serious bacterial infection metabolic instability or impending vascular collapse.  Nursing Notes Reviewed/ Care  Coordinated Applicable Imaging Reviewed Interpretation of Laboratory Data incorporated into ED treatment  The patient appears reasonably screened and/or stabilized for discharge and I doubt any other medical  condition or other [facility] requiring further screening, evaluation, or treatment in the ED at this time prior to discharge.  Plan: Home Medications-continue usual medications; Home Treatments-drink plenty of fluids and stay on a low carbohydrate diet; return here if the recommended treatment, does not improve the symptoms; Recommended follow up-PCP, checkup 1 week and as needed.   ED Discharge Orders        Ordered    insulin aspart protamine- aspart (NOVOLOG MIX 70/30) (70-30) 100 UNIT/ML injection  2 times daily with meals     07/28/17 1626       Mancel Bale, MD 07/28/17 1631

## 2017-07-28 NOTE — ED Notes (Signed)
ED Provider at bedside. 

## 2017-08-04 ENCOUNTER — Encounter (HOSPITAL_COMMUNITY): Payer: Self-pay | Admitting: Nurse Practitioner

## 2017-08-04 ENCOUNTER — Other Ambulatory Visit: Payer: Self-pay

## 2017-08-04 ENCOUNTER — Emergency Department (HOSPITAL_COMMUNITY)
Admission: EM | Admit: 2017-08-04 | Discharge: 2017-08-04 | Disposition: A | Discharge status: Home / Self Care | Payer: Medicaid Other | Attending: Emergency Medicine | Admitting: Emergency Medicine

## 2017-08-04 DIAGNOSIS — R1084 Generalized abdominal pain: Secondary | ICD-10-CM

## 2017-08-04 DIAGNOSIS — I119 Hypertensive heart disease without heart failure: Secondary | ICD-10-CM | POA: Diagnosis not present

## 2017-08-04 DIAGNOSIS — E119 Type 2 diabetes mellitus without complications: Secondary | ICD-10-CM | POA: Diagnosis not present

## 2017-08-04 DIAGNOSIS — Z7982 Long term (current) use of aspirin: Secondary | ICD-10-CM | POA: Diagnosis not present

## 2017-08-04 DIAGNOSIS — Z79899 Other long term (current) drug therapy: Secondary | ICD-10-CM | POA: Diagnosis not present

## 2017-08-04 DIAGNOSIS — F17210 Nicotine dependence, cigarettes, uncomplicated: Secondary | ICD-10-CM | POA: Insufficient documentation

## 2017-08-04 DIAGNOSIS — R109 Unspecified abdominal pain: Secondary | ICD-10-CM | POA: Diagnosis present

## 2017-08-04 DIAGNOSIS — Z794 Long term (current) use of insulin: Secondary | ICD-10-CM | POA: Diagnosis not present

## 2017-08-04 LAB — COMPREHENSIVE METABOLIC PANEL
ALBUMIN: 4 g/dL (ref 3.5–5.0)
ALT: 28 U/L (ref 17–63)
AST: 21 U/L (ref 15–41)
Alkaline Phosphatase: 74 U/L (ref 38–126)
Anion gap: 10 (ref 5–15)
BILIRUBIN TOTAL: 1.1 mg/dL (ref 0.3–1.2)
BUN: 12 mg/dL (ref 6–20)
CO2: 24 mmol/L (ref 22–32)
CREATININE: 1.11 mg/dL (ref 0.61–1.24)
Calcium: 9.3 mg/dL (ref 8.9–10.3)
Chloride: 102 mmol/L (ref 101–111)
GFR calc Af Amer: >60 mL/min (ref >60)
GLUCOSE: 107 mg/dL — AB (ref 65–99)
Potassium: 3.7 mmol/L (ref 3.5–5.1)
Sodium: 136 mmol/L (ref 135–145)
TOTAL PROTEIN: 7.3 g/dL (ref 6.5–8.1)

## 2017-08-04 LAB — CBC
HCT: 43.9 % (ref 39.0–52.0)
Hemoglobin: 15.1 g/dL (ref 13.0–17.0)
MCH: 27.9 pg (ref 26.0–34.0)
MCHC: 34.4 g/dL (ref 30.0–36.0)
MCV: 81 fL (ref 78.0–100.0)
PLATELETS: 248 10*3/uL (ref 150–400)
RBC: 5.42 MIL/uL (ref 4.22–5.81)
RDW: 13.9 % (ref 11.5–15.5)
WBC: 9.3 10*3/uL (ref 4.0–10.5)

## 2017-08-04 LAB — LIPASE, BLOOD: Lipase: 19 U/L (ref 11–51)

## 2017-08-04 MED ORDER — ONDANSETRON HCL 4 MG/2ML IJ SOLN: 4.0000 mg | Freq: Once | INTRAMUSCULAR | Status: DC

## 2017-08-04 MED ORDER — KETOROLAC TROMETHAMINE 60 MG/2ML IM SOLN
15.0000 mg | Freq: Once | INTRAMUSCULAR | Status: AC
  Administered 2017-08-04: 15 mg via INTRAMUSCULAR
  Filled 2017-08-04: qty 2

## 2017-08-04 MED ORDER — OMEPRAZOLE 20 MG PO CPDR: 20.0000 mg | DELAYED_RELEASE_CAPSULE | Freq: Every day | ORAL | 0 refills | Status: DC

## 2017-08-04 MED ORDER — FAMOTIDINE 20 MG PO TABS
20.0000 mg | ORAL_TABLET | Freq: Once | ORAL | Status: AC
  Administered 2017-08-04: 20 mg via ORAL
  Filled 2017-08-04: qty 1

## 2017-08-04 MED ORDER — DICYCLOMINE HCL 20 MG PO TABS: 20.0000 mg | ORAL_TABLET | Freq: Two times a day (BID) | ORAL | 0 refills | Status: DC

## 2017-08-04 MED ORDER — DICYCLOMINE HCL 20 MG PO TABS
20.0000 mg | ORAL_TABLET | Freq: Once | ORAL | Status: AC
  Administered 2017-08-04: 20 mg via ORAL
  Filled 2017-08-04: qty 1

## 2017-08-04 MED ORDER — ONDANSETRON 4 MG PO TBDP
4.0000 mg | ORAL_TABLET | Freq: Once | ORAL | Status: AC
  Administered 2017-08-04: 4 mg via ORAL
  Filled 2017-08-04: qty 1

## 2017-08-04 MED ORDER — GI COCKTAIL ~~LOC~~
30.0000 mL | Freq: Once | ORAL | Status: AC
  Administered 2017-08-04: 30 mL via ORAL
  Filled 2017-08-04: qty 30

## 2017-08-04 NOTE — ED Provider Notes (Signed)
3:25 PM patient seen in conjunction with Petrucelli PA-C.  Patient presents with abdominal pain.  Patient with several ED visits over the past 2 months since moving from [place].  Patient has had abdominal CT without acute findings, normal aorta other than mild atherosclerosis. He had inpatient psych admission for SI.   Patient was admitted for chest pain in setting of cocaine use and had a catheterization showing nonobstructive coronary artery disease.  He does have a history of diabetes.  Patient is homeless.  Pain is generalized.  Lack of workup today is reassuring.  No signs of peritonitis on exam.  Patient was given a GI cocktail, Bentyl in the emergency department without improvement in symptoms.  His blood sugar is well controlled and there is no signs of DKA.  Patient denies heavy NSAID or alcohol use.  Given lack of objective symptoms including focal pain today, normal vital signs, normal labs without leukocytosis --do not feel that repeat imaging is indicated at this time.  We will continue conservative measures and avoid narcotic medications given recent substance abuse history.  Will provide patient with referrals for primary care.  The patient was urged to return to the Emergency Department immediately with worsening of current symptoms, worsening abdominal pain, persistent vomiting, blood noted in stools, fever, or any other concerns.  BP (!) 130/93 (BP Location: Left Arm)   Pulse 80   Temp 98.7 F (37.1 C) (Oral)   Resp 20   Ht 6\' 2"  (1.88 m)   Wt 104.3 kg (230 lb)   SpO2 100%   BMI 29.53 kg/m   Results for orders placed or performed during the hospital encounter of 08/04/17  Lipase, blood  Result Value Ref Range   Lipase 19 11 - 51 U/L  Comprehensive metabolic panel  Result Value Ref Range   Sodium 136 135 - 145 mmol/L   Potassium 3.7 3.5 - 5.1 mmol/L   Chloride 102 101 - 111 mmol/L   CO2 24 22 - 32 mmol/L   Glucose, Bld 107 (H) 65 - 99 mg/dL   BUN 12 6 - 20 mg/dL    Creatinine, Ser 1.611.11 0.61 - 1.24 mg/dL   Calcium 9.3 8.9 - 09.610.3 mg/dL   Total Protein 7.3 6.5 - 8.1 g/dL   Albumin 4.0 3.5 - 5.0 g/dL   AST 21 15 - 41 U/L   ALT 28 17 - 63 U/L   Alkaline Phosphatase 74 38 - 126 U/L   Total Bilirubin 1.1 0.3 - 1.2 mg/dL   GFR calc non Af Amer >60 >60 mL/min   GFR calc Af Amer >60 >60 mL/min   Anion gap 10 5 - 15  CBC  Result Value Ref Range   WBC 9.3 4.0 - 10.5 K/uL   RBC 5.42 4.22 - 5.81 MIL/uL   Hemoglobin 15.1 13.0 - 17.0 g/dL   HCT 04.543.9 40.939.0 - 81.152.0 %   MCV 81.0 78.0 - 100.0 fL   MCH 27.9 26.0 - 34.0 pg   MCHC 34.4 30.0 - 36.0 g/dL   RDW 91.413.9 78.211.5 - 95.615.5 %   Platelets 248 150 - 400 K/uL   Ct Abdomen Pelvis W Contrast  Result Date: 07/07/2017 CLINICAL DATA:  56 year old male with abdominal pain. EXAM: CT ABDOMEN AND PELVIS WITH CONTRAST TECHNIQUE: Multidetector CT imaging of the abdomen and pelvis was performed using the standard protocol following bolus administration of intravenous contrast. CONTRAST:  <See Chart> ISOVUE-300 IOPAMIDOL (ISOVUE-300) INJECTION 61% COMPARISON:  None. FINDINGS: Lower chest: There is  mild eventration of the right hemidiaphragm. Right lung base linear atelectatic changes/scarring. Pneumonia is not entirely excluded. Clinical correlation is recommended. There is mild bilateral lower lobe bronchiectatic changes. There is no intra-abdominal free air or free fluid. Hepatobiliary: There is apparent mild fatty infiltration of the liver. No intrahepatic biliary ductal dilatation. The gallbladder is unremarkable. Pancreas: Unremarkable. No pancreatic ductal dilatation or surrounding inflammatory changes. Spleen: Normal in size without focal abnormality. Adrenals/Urinary Tract: The adrenal glands are unremarkable. There is a 2.5 cm left renal inferior pole cyst. There is no hydronephrosis on either side. There is symmetric enhancement and excretion of contrast by both kidneys. The visualized ureters and urinary bladder appear  unremarkable. Stomach/Bowel: There is no evidence of bowel obstruction or active inflammation. Normal appendix. Vascular/Lymphatic: Mild aortoiliac atherosclerotic disease. The IVC is grossly unremarkable. No portal venous gas. There is no adenopathy. Reproductive: The prostate and seminal vesicles are grossly unremarkable. Other: Small fat containing umbilical hernia. No fluid collection or inflammatory changes. Musculoskeletal: No acute or significant osseous findings. IMPRESSION: 1. No acute intra-abdominal or pelvic pathology. No bowel obstruction or active inflammation. Normal appendix. 2. Mild fatty liver. 3. Mild eventration of the right hemidiaphragm with right lung base linear atelectasis/scarring. Mild bilateral lower lobe bronchiectatic changes. Electronically Signed   By: Elgie CollardArash  Radparvar M.D.   On: 07/07/2017 23:08       Renne CriglerGeiple, Diasha Castleman, PA-C 08/04/17 1529    Gerhard MunchLockwood, Robert, MD 08/05/17 [phone]

## 2017-08-04 NOTE — Discharge Instructions (Addendum)
You were seen in the emergency department for abdominal pain.  Your lab work was normal.  Your kidney and liver function tests were normal.  You did not have any electrolyte abnormalities.  The enzyme we test for your pancreas was normal.  Your blood counts did not show any signs of infection or anemia.  I have prescribed you to medications to take at home.  These include omeprazole and Bentyl.  Take these as prescribed.  You  will need to follow-up with a primary care provider I have included a primary care provider in the area in your discharge instructions as well as a community clinic.  Follow-up with 1 of these locations in 3 days.  Return to the emergency department for any new or worsening symptoms including but not limited to worsening pain, inability to keep fluids down, blood in your vomit, or blood in your stool.  I have prescribed a new medication for you today. It is important that when you pick the prescription up you discuss the potential interactions of this medication with other medications you are taking, including over the counter medications, with the pharmacists.  This new medication has potential side effects. Be sure to contact your primary care provider or return to the emergency department if you are experiencing new symptoms that you are unable to tolerate after starting the medication. You need to receive medical evaluation immediately if you start to experience blistering of the skin, rash, swelling, or difficulty breathing as these signs could indicate a more serious medication side effect.

## 2017-08-04 NOTE — ED Notes (Signed)
He has eaten some crackers and drank a Sprite and now is given a sandwich per his request.

## 2017-08-04 NOTE — ED Provider Notes (Signed)
[facility]-EMERGENCY DEPT Provider Note   CSN: [id number] Arrival date & time: 08/04/17  1043     History   Chief Complaint Chief Complaint  Patient presents with  . Abdominal Pain    HPI Charles Orr is a 56 y.o. male with a hx of HTN, hypercholesterolemia, GERD, T2DM, CAD, and inguinal hernia repair complaining of abdominal pain that started yesterday at 3:30 in the morning.  Patient states that he ate a hamburger prior to going to bed, woke up in pain.  Describes the pain as being a constant stabbing/aching to his mid to upper abdomen.  Relays he tried Pepto-Bismol at home with some relief, otherwise no alleviating or aggravating factors.  Reports associated nausea and vomiting, no blood in emesis.  Patient states he has not been able to tolerate PO since onset.  Denies diarrhea, constipation, fever, chills, dysuria, chest pain or shortness of breath. No heavy NSAID or alcohol use.   HPI  Past Medical History:  Diagnosis Date  . Anxiety   . Arthritis    "back" (06/12/2017)  . Bipolar disorder (HCC)   . CAP (community acquired pneumonia) 06/10/2017  . Chronic lower back pain   . Degenerative disc disease, lumbar   . Depression   . Fibromyalgia   . GERD (gastroesophageal reflux disease)   . High cholesterol   . Hypertension   . Myocardial infarction (HCC) 2002  . Pneumonia 1989  . Schizophrenia (HCC)   . Type II diabetes mellitus [facility])     Patient Active Problem List   Diagnosis Date Noted  . Chest pain 07/10/2017  . Angina pectoris (HCC) 07/09/2017  . Tobacco abuse 07/09/2017  . Hypertension 07/09/2017  . Type II diabetes mellitus (HCC)   . GERD (gastroesophageal reflux disease)   . Chronic lower back pain   . Poorly controlled diabetes mellitus (HCC)   . Hyperlipidemia with target LDL less than 70   . Schizoaffective disorder, bipolar type (HCC) 06/16/2017  . Depression 06/15/2017  . Other schizophrenia (HCC)   . Pneumonia 06/11/2017  .  Community acquired pneumonia of right lower lobe of lung (HCC)   . Hyperglycemia   . Localized edema   . Shortness of breath 06/10/2017    Past Surgical History:  Procedure Laterality Date  . CARDIAC CATHETERIZATION  12/2006   [name] 01/18/2011  . INGUINAL HERNIA REPAIR Right   . LEFT HEART CATH AND CORONARY ANGIOGRAPHY N/A 07/11/2017   Procedure: LEFT HEART CATH AND CORONARY ANGIOGRAPHY;  Surgeon: Tonny Bollman, MD;  Location: [facility] INVASIVE CV LAB;  Service: Cardiovascular;  Laterality: N/A;  . PARATHYROIDECTOMY    . PERICARDIAL TAP  2000   ?; in [place]/notes 01/18/2011       Home Medications    Prior to Admission medications   Medication Sig Start Date End Date Taking? Authorizing Provider  acetaminophen (TYLENOL) 500 MG tablet Take 1,000 mg by mouth every 6 (six) hours as needed for moderate pain.    [provider]  aspirin 81 MG EC tablet Take 1 tablet (81 mg total) by mouth daily. For heart health 06/26/17   Armandina Stammer I, NP  atorvastatin (LIPITOR) 40 MG tablet Take 1 tablet (40 mg total) by mouth daily. For high Cholesterol 06/26/17   Armandina Stammer I, NP  gabapentin (NEURONTIN) 300 MG capsule Take 1 capsule (300 mg total) by mouth 2 (two) times daily. For agitation 06/26/17   Armandina Stammer I, NP  hydrochlorothiazide (HYDRODIURIL) 25 MG tablet Take 1  tablet (25 mg total) by mouth daily. For high blood pressure 06/26/17   Armandina StammerNwoko, Agnes I, NP  hydrOXYzine (ATARAX/VISTARIL) 25 MG tablet Take 1 tablet (25 mg total) by mouth 3 (three) times daily as needed for anxiety. 06/26/17   Armandina StammerNwoko, Agnes I, NP  insulin aspart protamine- aspart (NOVOLOG MIX 70/30) (70-30) 100 UNIT/ML injection Inject 0.45-0.6 mLs (45-60 Units total) into the skin 2 (two) times daily with a meal. 60 units in the morning and 45 units at dinner time 07/28/17   Mancel BaleWentz, Elliott, MD  lidocaine (LIDODERM) 5 % Place 1 patch onto the skin daily. Remove & Discard patch within 12 hours or as directed by MD: For pain  management Patient not taking: Reported on 07/07/2017 06/27/17   Armandina StammerNwoko, Agnes I, NP  metoprolol tartrate (LOPRESSOR) 25 MG tablet Take 1 tablet (25 mg total) by mouth 2 (two) times daily. For high blood pressure 06/26/17   Armandina StammerNwoko, Agnes I, NP  Multiple Vitamin (MULTIVITAMIN WITH MINERALS) TABS tablet Take 1 tablet by mouth daily. For Vitamin supplement 06/26/17   Armandina StammerNwoko, Agnes I, NP  nicotine (NICODERM CQ - DOSED IN MG/24 HOURS) 14 mg/24hr patch Place 1 patch (14 mg total) onto the skin daily. (May purchase over the counter): For smoking cessation Patient not taking: Reported on 07/07/2017 06/27/17   Armandina StammerNwoko, Agnes I, NP  nitroGLYCERIN (NITROSTAT) 0.4 MG SL tablet Place 1 tablet (0.4 mg total) every 5 (five) minutes as needed under the tongue for chest pain. Patient not taking: Reported on 07/28/2017 07/11/17   Joseph ArtVann, Jessica U, DO  risperiDONE (RISPERDAL) 1 MG tablet Take 1 tablet (1 mg) by mouth in the mornings & 2 tablets (2 mg) at bedtime: For mood control Patient taking differently: Take 1-2 mg by mouth 2 (two) times daily. Take 1 tablet (1 mg) by mouth in the mornings & 2 tablets (2 mg) at bedtime: For mood control 06/26/17   Armandina StammerNwoko, Agnes I, NP  sertraline (ZOLOFT) 50 MG tablet Take 1 tablet (50 mg total) by mouth daily. For depression 06/27/17   Armandina StammerNwoko, Agnes I, NP  traZODone (DESYREL) 100 MG tablet Take 1 tablet (100 mg total) by mouth at bedtime as needed for sleep. 06/26/17   Sanjuana KavaNwoko, Agnes I, NP    Family History Family History  Problem Relation Age of Onset  . Hypertension Mother   . Diabetes Mother   . Heart disease Father   . Hypertension Sister   . Diabetes Sister   . Hypertension Brother   . Diabetes Brother   . Hypertension Sister   . Diabetes Sister   . Hypertension Brother   . Diabetes Brother     Social History Social History   Tobacco Use  . Smoking status: Current Every Day Smoker    Packs/day: 0.50    Years: 38.00    Pack years: 19.00    Types: Cigarettes  .  Smokeless tobacco: Current User    Types: Chew  Substance Use Topics  . Alcohol use: Yes    Comment: 06/12/2017 "stopped ~ 1 yr ago"   . Drug use: Yes    Types: Cocaine    Comment: 06/12/2017 "stopped using cocaine ~ 1 yr ago; used marijuana when I was younger" last cocaine use 9 mths ago per pt on 06/15/17     Allergies   Patient has no known allergies.   Review of Systems Review of Systems  Constitutional: Negative for chills and fever.  HENT: Positive for congestion. Negative for rhinorrhea and sore  throat.   Respiratory: Negative for chest tightness and shortness of breath.   Cardiovascular: Negative for chest pain.  Gastrointestinal: Positive for abdominal pain, nausea and vomiting. Negative for blood in stool, constipation and diarrhea.  Genitourinary: Negative for discharge, dysuria, flank pain, hematuria, penile pain, scrotal swelling and testicular pain.  Skin: Negative for rash.  Neurological: Negative for weakness and numbness.  All other systems reviewed and are negative.    Physical Exam Updated Vital Signs BP 126/87 (BP Location: Left Arm)   Pulse 87   Temp 98.7 F (37.1 C) (Oral)   Resp 18   Ht 6\' 2"  (1.88 m)   Wt 104.3 kg (230 lb)   SpO2 97%   BMI 29.53 kg/m   Physical Exam  Constitutional: He appears well-developed and well-nourished. No distress.  HENT:  Head: Normocephalic and atraumatic.  Eyes: Conjunctivae are normal. Right eye exhibits no discharge. Left eye exhibits no discharge.  Cardiovascular: Normal rate and regular rhythm.  No murmur heard. Pulmonary/Chest: Breath sounds normal. No respiratory distress. He has no wheezes. He has no rales.  Abdominal: Soft. There is tenderness (diffusely). There is no rigidity, no rebound, no guarding, no tenderness at McBurney's point and negative Murphy's sign.  Negative obturator and psoas sign.   Neurological: He is alert.  Clear speech.   Skin: Skin is warm and dry. No rash noted.  Psychiatric: He  has a normal mood and affect. His behavior is normal.  Nursing note and vitals reviewed.     ED Treatments / Results  Labs Results for orders placed or performed during the hospital encounter of 08/04/17  Lipase, blood  Result Value Ref Range   Lipase 19 11 - 51 U/L  Comprehensive metabolic panel  Result Value Ref Range   Sodium 136 135 - 145 mmol/L   Potassium 3.7 3.5 - 5.1 mmol/L   Chloride 102 101 - 111 mmol/L   CO2 24 22 - 32 mmol/L   Glucose, Bld 107 (H) 65 - 99 mg/dL   BUN 12 6 - 20 mg/dL   Creatinine, Ser 1.61 0.61 - 1.24 mg/dL   Calcium 9.3 8.9 - 09.6 mg/dL   Total Protein 7.3 6.5 - 8.1 g/dL   Albumin 4.0 3.5 - 5.0 g/dL   AST 21 15 - 41 U/L   ALT 28 17 - 63 U/L   Alkaline Phosphatase 74 38 - 126 U/L   Total Bilirubin 1.1 0.3 - 1.2 mg/dL   GFR calc non Af Amer >60 >60 mL/min   GFR calc Af Amer >60 >60 mL/min   Anion gap 10 5 - 15  CBC  Result Value Ref Range   WBC 9.3 4.0 - 10.5 K/uL   RBC 5.42 4.22 - 5.81 MIL/uL   Hemoglobin 15.1 13.0 - 17.0 g/dL   HCT 04.5 40.9 - 81.1 %   MCV 81.0 78.0 - 100.0 fL   MCH 27.9 26.0 - 34.0 pg   MCHC 34.4 30.0 - 36.0 g/dL   RDW 91.4 78.2 - 95.6 %   Platelets 248 150 - 400 K/uL   Radiology No results found.  Procedures Procedures (including critical care time)  Medications Ordered in ED Medications  famotidine (PEPCID) tablet 20 mg (not administered)  ketorolac (TORADOL) injection 15 mg (not administered)  dicyclomine (BENTYL) tablet 20 mg (20 mg Oral Given 08/04/17 1342)  gi cocktail (Maalox,Lidocaine,Donnatal) (30 mLs Oral Given 08/04/17 1342)  ondansetron (ZOFRAN-ODT) disintegrating tablet 4 mg (4 mg Oral Given 08/04/17 1341)  Initial Impression / Assessment and Plan / ED Course  I have reviewed the triage vital signs and the nursing notes.  Pertinent labs & imaging results that were available during my care of the patient were reviewed by me and considered in my medical decision making (see chart for details).    Patient presents with complaint of abdominal pain.  Patient is nontoxic, nonseptic appearing, in no apparent distress. Vitals are stable. Patient does not meet the SIRS or Sepsis criteria.  On repeat exam patient does not have a surgical abdomen and there are no peritoneal signs, diffusely tender.  No indication of appendicitis, bowel obstruction, bowel perforation, cholecystitis, or diverticulitis. Patient is afebrile with WBC WNL.   CT scan from 07/07/17 reviewed:   CLINICAL DATA:  56 year old male with abdominal pain. EXAM: CT ABDOMEN AND PELVIS WITH CONTRAST TECHNIQUE: Multidetector CT imaging of the abdomen and pelvis was performed using the standard protocol following bolus administration of intravenous contrast. CONTRAST:  <See Chart> ISOVUE-300 IOPAMIDOL (ISOVUE-300) INJECTION 61% COMPARISON:  None. FINDINGS: Lower chest: There is mild eventration of the right hemidiaphragm. Right lung base linear atelectatic changes/scarring. Pneumonia is not entirely excluded. Clinical correlation is recommended. There is mild bilateral lower lobe bronchiectatic changes. There is no intra-abdominal free air or free fluid. Hepatobiliary: There is apparent mild fatty infiltration of the liver. No intrahepatic biliary ductal dilatation. The gallbladder is unremarkable. Pancreas: Unremarkable. No pancreatic ductal dilatation or surrounding inflammatory changes. Spleen: Normal in size without focal abnormality. Adrenals/Urinary Tract: The adrenal glands are unremarkable. There is a 2.5 cm left renal inferior pole cyst. There is no hydronephrosis on either side. There is symmetric enhancement and excretion of contrast by both kidneys. The visualized ureters and urinary bladder appear unremarkable. Stomach/Bowel: There is no evidence of bowel obstruction or active inflammation. Normal appendix. Vascular/Lymphatic: Mild aortoiliac atherosclerotic disease. The IVC is grossly unremarkable. No portal venous gas. There is no  adenopathy. Reproductive: The prostate and seminal vesicles are grossly unremarkable. Other: Small fat containing umbilical hernia. No fluid collection or inflammatory changes. Musculoskeletal: No acute or significant osseous findings. IMPRESSION: 1. No acute intra-abdominal or pelvic pathology. No bowel obstruction or active inflammation. Normal appendix. 2. Mild fatty liver. 3. Mild eventration of the right hemidiaphragm with right lung base linear atelectasis/scarring. Mild bilateral lower lobe bronchiectatic changes. Electronically Signed   By: Elgie CollardArash  Radparvar M.D.   On: 07/07/2017 23:08   Given patient's pain is non-focal, vitals are stable, and labs are unremarkable do not feel that repeat imaging is necessary at this time. Will manage patient's symptoms and avoid narcotics given hx of substance abuse with cocaine. Will give Zofran, GI cocktail, and Bentyl.   14:40: Re-eval: Patient's nausea improved, pain unchanged. Pepcid ordered.   15:12: Re-eval: Patient's pain unchanged following Pepcid. Discussed case with Rhea BleacherJosh Geiple, PA-C who evaluated the patient as well. Will give patient Toradol.    Discussed patients lab results, need for primary care follow up, and ED return precautions patient.  Provided opportunity for questions, patient confirmed understanding and is agreeable with plan.  Vitals:   08/04/17 1515 08/04/17 1530  BP:  (!) 143/89  Pulse: 79 88  Resp:    Temp:    SpO2: 100% 100%    Final Clinical Impressions(s) / ED Diagnoses   Final diagnoses:  Generalized abdominal pain    ED Discharge Orders    None       Desmond Lopeetrucelli, Stryder Poitra R, PA-C 08/04/17 1616    Gerhard MunchLockwood, Robert, MD 08/05/17  1559  

## 2017-08-04 NOTE — ED Triage Notes (Addendum)
[facility] from [facility] with c/o abdominal pain (epigastric)x2days. Patient reported to ems he had normal bm this am. Abdomen soft, nontender, ambulatory to stretcher, held discussion without apparent discomfort in route. VS: Pain 9/10, 116/80, 94, 98% RA, 18 resp, CBG 120. NSR. Patient is diabetic and took insulin this am but has not eaten.

## 2017-08-05 ENCOUNTER — Encounter (HOSPITAL_COMMUNITY): Payer: Self-pay | Admitting: Emergency Medicine

## 2017-08-05 ENCOUNTER — Emergency Department (HOSPITAL_COMMUNITY)
Admission: EM | Admit: 2017-08-05 | Discharge: 2017-08-05 | Disposition: A | Discharge status: Home / Self Care | Payer: Medicaid Other | Attending: Emergency Medicine | Admitting: Emergency Medicine

## 2017-08-05 DIAGNOSIS — R1012 Left upper quadrant pain: Secondary | ICD-10-CM | POA: Insufficient documentation

## 2017-08-05 DIAGNOSIS — Z79899 Other long term (current) drug therapy: Secondary | ICD-10-CM | POA: Insufficient documentation

## 2017-08-05 DIAGNOSIS — E119 Type 2 diabetes mellitus without complications: Secondary | ICD-10-CM | POA: Insufficient documentation

## 2017-08-05 DIAGNOSIS — Z7982 Long term (current) use of aspirin: Secondary | ICD-10-CM | POA: Diagnosis not present

## 2017-08-05 DIAGNOSIS — R112 Nausea with vomiting, unspecified: Secondary | ICD-10-CM | POA: Diagnosis not present

## 2017-08-05 DIAGNOSIS — Z794 Long term (current) use of insulin: Secondary | ICD-10-CM | POA: Diagnosis not present

## 2017-08-05 DIAGNOSIS — I10 Essential (primary) hypertension: Secondary | ICD-10-CM | POA: Insufficient documentation

## 2017-08-05 DIAGNOSIS — F17210 Nicotine dependence, cigarettes, uncomplicated: Secondary | ICD-10-CM | POA: Insufficient documentation

## 2017-08-05 LAB — COMPREHENSIVE METABOLIC PANEL
ALBUMIN: 4.2 g/dL (ref 3.5–5.0)
ALT: 26 U/L (ref 17–63)
AST: 19 U/L (ref 15–41)
Alkaline Phosphatase: 78 U/L (ref 38–126)
Anion gap: 10 (ref 5–15)
BILIRUBIN TOTAL: 1.2 mg/dL (ref 0.3–1.2)
BUN: 14 mg/dL (ref 6–20)
CALCIUM: 9.4 mg/dL (ref 8.9–10.3)
CO2: 25 mmol/L (ref 22–32)
Chloride: 99 mmol/L — ABNORMAL LOW (ref 101–111)
Creatinine, Ser: 1.13 mg/dL (ref 0.61–1.24)
GFR calc Af Amer: >60 mL/min (ref >60)
GFR calc non Af Amer: >60 mL/min (ref >60)
GLUCOSE: 274 mg/dL — AB (ref 65–99)
Potassium: 4.1 mmol/L (ref 3.5–5.1)
Sodium: 134 mmol/L — ABNORMAL LOW (ref 135–145)
TOTAL PROTEIN: 8 g/dL (ref 6.5–8.1)

## 2017-08-05 LAB — CBC WITH DIFFERENTIAL/PLATELET
BASOS PCT: 1 %
Basophils Absolute: 0 10*3/uL (ref 0.0–0.1)
Eosinophils Absolute: 0 10*3/uL (ref 0.0–0.7)
Eosinophils Relative: 0 %
HEMATOCRIT: 46.4 % (ref 39.0–52.0)
HEMOGLOBIN: 15.9 g/dL (ref 13.0–17.0)
Lymphocytes Relative: 26 %
Lymphs Abs: 1.4 10*3/uL (ref 0.7–4.0)
MCH: 27.7 pg (ref 26.0–34.0)
MCHC: 34.3 g/dL (ref 30.0–36.0)
MCV: 80.8 fL (ref 78.0–100.0)
MONOS PCT: 8 %
Monocytes Absolute: 0.4 10*3/uL (ref 0.1–1.0)
NEUTROS ABS: 3.7 10*3/uL (ref 1.7–7.7)
NEUTROS PCT: 65 %
Platelets: 243 10*3/uL (ref 150–400)
RBC: 5.74 MIL/uL (ref 4.22–5.81)
RDW: 13.6 % (ref 11.5–15.5)
WBC: 5.6 10*3/uL (ref 4.0–10.5)

## 2017-08-05 LAB — LIPASE, BLOOD: Lipase: 24 U/L (ref 11–51)

## 2017-08-05 MED ORDER — INSULIN ASPART 100 UNIT/ML ~~LOC~~ SOLN
5.0000 [IU] | Freq: Once | SUBCUTANEOUS | Status: AC
  Administered 2017-08-05: 5 [IU] via SUBCUTANEOUS
  Filled 2017-08-05: qty 1

## 2017-08-05 MED ORDER — HALOPERIDOL LACTATE 5 MG/ML IJ SOLN
2.0000 mg | Freq: Once | INTRAMUSCULAR | Status: AC
  Administered 2017-08-05: 2 mg via INTRAVENOUS
  Filled 2017-08-05: qty 1

## 2017-08-05 MED ORDER — SODIUM CHLORIDE 0.9 % IV BOLUS (SEPSIS)
1000.0000 mL | Freq: Once | INTRAVENOUS | Status: AC
  Administered 2017-08-05: 1000 mL via INTRAVENOUS

## 2017-08-05 MED ORDER — ONDANSETRON HCL 4 MG PO TABS: 4.0000 mg | ORAL_TABLET | Freq: Four times a day (QID) | ORAL | 0 refills | Status: DC

## 2017-08-05 MED ORDER — ONDANSETRON HCL 4 MG/2ML IJ SOLN
4.0000 mg | Freq: Once | INTRAMUSCULAR | Status: AC
  Administered 2017-08-05: 4 mg via INTRAVENOUS
  Filled 2017-08-05: qty 2

## 2017-08-05 MED ORDER — OMEPRAZOLE 20 MG PO CPDR: 20.0000 mg | DELAYED_RELEASE_CAPSULE | Freq: Every day | ORAL | 0 refills | Status: DC

## 2017-08-05 NOTE — ED Notes (Signed)
Pt stated that pain in abdominal area has been going on for 3 days now. Pt was dry heaving in lobby with no vomit. Pt is in room now and is no longer dry heaving. Pt is grunting in pain.

## 2017-08-05 NOTE — ED Triage Notes (Addendum)
Per [facility]. Pt from homeless shelter. Pt reports generalized abd pain that has not improved with pepto bismol. Pt was seen here for the same yesterday.  Has not take am insulin yet, however CBG 281 with [facility]. Pt reports he returned because his pain became worse and he is now having emesis.

## 2017-08-05 NOTE — ED Notes (Signed)
ED Provider at bedside. 

## 2017-08-05 NOTE — ED Provider Notes (Signed)
[facility]-EMERGENCY DEPT Provider Note   CSN: [id number] Arrival date & time: 08/05/17  0750     History   Chief Complaint Chief Complaint  Patient presents with  . Abdominal Pain    HPI Charles Orr is a 56 y.o. male.  HPI   56 year old male with history of bipolar disorder, schizophrenia, diabetes, fibromyalgia, chronic pain with history of opiate abuse presenting today with complaints of abdominal pain.  Patient was seen in ER yesterday for his abdominal pain.  He has also been seen in the ED several times within the past 2 months since moving from [place].  He had a pretty thorough workup including abdominal CT scan and labs without acute finding.  Recently admitted for chest pain in setting of cocaine use and had a heart catheterization showing nonobstructive coronary artery disease.  Patient is homeless.  Patient states for the past 3 days he has had persistent abdominal discomfort.  Described pain as a sharp, 10 out of 10, nothing makes it better or worse.  Endorsed nausea and vomiting 4 times today with yellow mucus.  Endorses occasional cough.  Has tried Pepto-Bismol with minimal relief.  Denies any recent alcohol recreational drug use.  No report of chest pain, shortness of breath, back pain, dysuria or rash.  Past Medical History:  Diagnosis Date  . Anxiety   . Arthritis    "back" (06/12/2017)  . Bipolar disorder (HCC)   . CAP (community acquired pneumonia) 06/10/2017  . Chronic lower back pain   . Degenerative disc disease, lumbar   . Depression   . Fibromyalgia   . GERD (gastroesophageal reflux disease)   . High cholesterol   . Hypertension   . Myocardial infarction (HCC) 2002  . Pneumonia 1989  . Schizophrenia (HCC)   . Type II diabetes mellitus [facility])     Patient Active Problem List   Diagnosis Date Noted  . Chest pain 07/10/2017  . Angina pectoris (HCC) 07/09/2017  . Tobacco abuse 07/09/2017  . Hypertension 07/09/2017  . Type II  diabetes mellitus (HCC)   . GERD (gastroesophageal reflux disease)   . Chronic lower back pain   . Poorly controlled diabetes mellitus (HCC)   . Hyperlipidemia with target LDL less than 70   . Schizoaffective disorder, bipolar type (HCC) 06/16/2017  . Depression 06/15/2017  . Other schizophrenia (HCC)   . Pneumonia 06/11/2017  . Community acquired pneumonia of right lower lobe of lung (HCC)   . Hyperglycemia   . Localized edema   . Shortness of breath 06/10/2017    Past Surgical History:  Procedure Laterality Date  . CARDIAC CATHETERIZATION  12/2006   [name] 01/18/2011  . INGUINAL HERNIA REPAIR Right   . LEFT HEART CATH AND CORONARY ANGIOGRAPHY N/A 07/11/2017   Procedure: LEFT HEART CATH AND CORONARY ANGIOGRAPHY;  Surgeon: Tonny Bollman, MD;  Location: [facility] INVASIVE CV LAB;  Service: Cardiovascular;  Laterality: N/A;  . PARATHYROIDECTOMY    . PERICARDIAL TAP  2000   ?; in [place]/notes 01/18/2011       Home Medications    Prior to Admission medications   Medication Sig Start Date End Date Taking? Authorizing Provider  acetaminophen (TYLENOL) 500 MG tablet Take 1,000 mg by mouth every 6 (six) hours as needed for moderate pain.    [provider]  aspirin 81 MG EC tablet Take 1 tablet (81 mg total) by mouth daily. For heart health 06/26/17   Armandina Stammer I, NP  atorvastatin (LIPITOR) 40  MG tablet Take 1 tablet (40 mg total) by mouth daily. For high Cholesterol 06/26/17   Armandina StammerNwoko, Agnes I, NP  dicyclomine (BENTYL) 20 MG tablet Take 1 tablet (20 mg total) by mouth 2 (two) times daily. 08/04/17   Petrucelli, Samantha R, PA-C  gabapentin (NEURONTIN) 300 MG capsule Take 1 capsule (300 mg total) by mouth 2 (two) times daily. For agitation 06/26/17   Armandina StammerNwoko, Agnes I, NP  hydrochlorothiazide (HYDRODIURIL) 25 MG tablet Take 1 tablet (25 mg total) by mouth daily. For high blood pressure 06/26/17   Armandina StammerNwoko, Agnes I, NP  hydrOXYzine (ATARAX/VISTARIL) 25 MG tablet Take 1 tablet (25 mg  total) by mouth 3 (three) times daily as needed for anxiety. 06/26/17   Armandina StammerNwoko, Agnes I, NP  insulin aspart protamine- aspart (NOVOLOG MIX 70/30) (70-30) 100 UNIT/ML injection Inject 0.45-0.6 mLs (45-60 Units total) into the skin 2 (two) times daily with a meal. 60 units in the morning and 45 units at dinner time 07/28/17   Mancel BaleWentz, Elliott, MD  lidocaine (LIDODERM) 5 % Place 1 patch onto the skin daily. Remove & Discard patch within 12 hours or as directed by MD: For pain management Patient not taking: Reported on 07/07/2017 06/27/17   Armandina StammerNwoko, Agnes I, NP  metoprolol tartrate (LOPRESSOR) 25 MG tablet Take 1 tablet (25 mg total) by mouth 2 (two) times daily. For high blood pressure 06/26/17   Armandina StammerNwoko, Agnes I, NP  Multiple Vitamin (MULTIVITAMIN WITH MINERALS) TABS tablet Take 1 tablet by mouth daily. For Vitamin supplement 06/26/17   Armandina StammerNwoko, Agnes I, NP  nicotine (NICODERM CQ - DOSED IN MG/24 HOURS) 14 mg/24hr patch Place 1 patch (14 mg total) onto the skin daily. (May purchase over the counter): For smoking cessation Patient not taking: Reported on 07/07/2017 06/27/17   Armandina StammerNwoko, Agnes I, NP  nitroGLYCERIN (NITROSTAT) 0.4 MG SL tablet Place 1 tablet (0.4 mg total) every 5 (five) minutes as needed under the tongue for chest pain. Patient not taking: Reported on 07/28/2017 07/11/17   Joseph ArtVann, Jessica U, DO  omeprazole (PRILOSEC) 20 MG capsule Take 1 capsule (20 mg total) by mouth daily. 08/04/17   Petrucelli, Samantha R, PA-C  risperiDONE (RISPERDAL) 1 MG tablet Take 1 tablet (1 mg) by mouth in the mornings & 2 tablets (2 mg) at bedtime: For mood control Patient taking differently: Take 1-2 mg by mouth 2 (two) times daily. Take 1 tablet (1 mg) by mouth in the mornings & 2 tablets (2 mg) at bedtime: For mood control 06/26/17   Armandina StammerNwoko, Agnes I, NP  sertraline (ZOLOFT) 50 MG tablet Take 1 tablet (50 mg total) by mouth daily. For depression 06/27/17   Armandina StammerNwoko, Agnes I, NP  traZODone (DESYREL) 100 MG tablet Take 1 tablet (100  mg total) by mouth at bedtime as needed for sleep. 06/26/17   Sanjuana KavaNwoko, Agnes I, NP    Family History Family History  Problem Relation Age of Onset  . Hypertension Mother   . Diabetes Mother   . Heart disease Father   . Hypertension Sister   . Diabetes Sister   . Hypertension Brother   . Diabetes Brother   . Hypertension Sister   . Diabetes Sister   . Hypertension Brother   . Diabetes Brother     Social History Social History   Tobacco Use  . Smoking status: Current Every Day Smoker    Packs/day: 0.50    Years: 38.00    Pack years: 19.00    Types: Cigarettes  . Smokeless  tobacco: Current User    Types: Chew  Substance Use Topics  . Alcohol use: Yes    Comment: 06/12/2017 "stopped ~ 1 yr ago"   . Drug use: Yes    Types: Cocaine    Comment: 06/12/2017 "stopped using cocaine ~ 1 yr ago; used marijuana when I was younger" last cocaine use 9 mths ago per pt on 06/15/17     Allergies   Patient has no known allergies.   Review of Systems Review of Systems  All other systems reviewed and are negative.    Physical Exam Updated Vital Signs BP 135/88 (BP Location: Right Arm)   Pulse 84   Temp 98.6 F (37 C) (Oral)   Resp 18   SpO2 100%   Physical Exam  Constitutional: He appears well-developed and well-nourished. No distress.  HENT:  Head: Atraumatic.  Eyes: Conjunctivae are normal.  Neck: Neck supple.  Cardiovascular: Normal rate and regular rhythm.  Pulmonary/Chest: Effort normal.  Abdominal: Soft. Normal appearance and bowel sounds are normal. There is tenderness (Diffuse abdominal tenderness most significant to left upper and lower abdomen without guarding or rebound tenderness.). There is no tenderness at McBurney's point and negative Murphy's sign. Hernia confirmed negative in the right inguinal area and confirmed negative in the left inguinal area.  Neurological: He is alert.  Skin: No rash noted.  Psychiatric: He has a normal mood and affect.  Nursing  note and vitals reviewed.    ED Treatments / Results  Labs (all labs ordered are listed, but only abnormal results are displayed) Labs Reviewed  COMPREHENSIVE METABOLIC PANEL - Abnormal; Notable for the following components:      Result Value   Sodium 134 (*)    Chloride 99 (*)    Glucose, Bld 274 (*)    All other components within normal limits  CBC WITH DIFFERENTIAL/PLATELET  LIPASE, BLOOD    EKG  EKG Interpretation None       Radiology No results found.  Procedures Procedures (including critical care time)  Medications Ordered in ED Medications  sodium chloride 0.9 % bolus 1,000 mL (0 mLs Intravenous Stopped 08/05/17 1125)  haloperidol lactate (HALDOL) injection 2 mg (2 mg Intravenous Given 08/05/17 0946)  ondansetron (ZOFRAN) injection 4 mg (4 mg Intravenous Given 08/05/17 0946)  insulin aspart (novoLOG) injection 5 Units (5 Units Subcutaneous Given 08/05/17 1123)     Initial Impression / Assessment and Plan / ED Course  I have reviewed the triage vital signs and the nursing notes.  Pertinent labs & imaging results that were available during my care of the patient were reviewed by me and considered in my medical decision making (see chart for details).     BP 137/86 (BP Location: Left Arm)   Pulse 88   Temp 97.9 F (36.6 C) (Oral)   Resp (!) 25   SpO2 100%    Final Clinical Impressions(s) / ED Diagnoses   Final diagnoses:  LUQ pain  Non-intractable vomiting with nausea, unspecified vomiting type    ED Discharge Orders        Ordered    omeprazole (PRILOSEC) 20 MG capsule  Daily     08/05/17 1135    ondansetron (ZOFRAN) 4 MG tablet  Every 6 hours     08/05/17 1135     9:17 AM Patient here with 3 days duration of abdominal pain, was seen in the ER yesterday for his complain and his workup yesterday was unremarkable.  He was seen approximately a month  ago for similar symptoms and had a CT of his abdomen and pelvis that did not show any acute  finding.  He has history of polysubstance abuse and has been admitted recently for chest pain in the setting of cocaine use.  He had a heart catheterization that showed nonobstructive vessel.  Plan to recheck labs and give nonopiate medication for his symptoms.  11:33 AM Labs are needed baseline.  Elevated CBG of 274, with normal anion gap.  At this time I do not think additional advanced imaging is indicated.  Encourage patient to follow-up with a primary care provider for further management of his health.  Return precautions discussed.  Resources provided. Sxs did improves with treatment.    Fayrene Helperran, Dean Wonder, PA-C 08/05/17 1136    Gerhard MunchLockwood, Robert, MD 08/05/17 [phone]

## 2017-08-10 ENCOUNTER — Emergency Department (HOSPITAL_COMMUNITY)
Admission: EM | Admit: 2017-08-10 | Discharge: 2017-08-11 | Disposition: A | Discharge status: Home / Self Care | Payer: Medicaid Other | Attending: Emergency Medicine | Admitting: Emergency Medicine

## 2017-08-10 ENCOUNTER — Other Ambulatory Visit: Payer: Self-pay

## 2017-08-10 ENCOUNTER — Encounter (HOSPITAL_COMMUNITY): Payer: Self-pay

## 2017-08-10 ENCOUNTER — Emergency Department (HOSPITAL_COMMUNITY): Payer: Medicaid Other

## 2017-08-10 DIAGNOSIS — Z79899 Other long term (current) drug therapy: Secondary | ICD-10-CM | POA: Insufficient documentation

## 2017-08-10 DIAGNOSIS — F17210 Nicotine dependence, cigarettes, uncomplicated: Secondary | ICD-10-CM | POA: Insufficient documentation

## 2017-08-10 DIAGNOSIS — Z794 Long term (current) use of insulin: Secondary | ICD-10-CM | POA: Diagnosis not present

## 2017-08-10 DIAGNOSIS — K5900 Constipation, unspecified: Secondary | ICD-10-CM | POA: Diagnosis not present

## 2017-08-10 DIAGNOSIS — I252 Old myocardial infarction: Secondary | ICD-10-CM | POA: Diagnosis not present

## 2017-08-10 DIAGNOSIS — I10 Essential (primary) hypertension: Secondary | ICD-10-CM | POA: Diagnosis not present

## 2017-08-10 DIAGNOSIS — E1165 Type 2 diabetes mellitus with hyperglycemia: Secondary | ICD-10-CM

## 2017-08-10 DIAGNOSIS — R112 Nausea with vomiting, unspecified: Secondary | ICD-10-CM

## 2017-08-10 DIAGNOSIS — Z7982 Long term (current) use of aspirin: Secondary | ICD-10-CM | POA: Insufficient documentation

## 2017-08-10 DIAGNOSIS — K219 Gastro-esophageal reflux disease without esophagitis: Secondary | ICD-10-CM | POA: Insufficient documentation

## 2017-08-10 DIAGNOSIS — R1032 Left lower quadrant pain: Secondary | ICD-10-CM | POA: Diagnosis present

## 2017-08-10 DIAGNOSIS — R109 Unspecified abdominal pain: Secondary | ICD-10-CM

## 2017-08-10 LAB — URINALYSIS, ROUTINE W REFLEX MICROSCOPIC
Bacteria, UA: NONE SEEN
Bilirubin Urine: NEGATIVE
Ketones, ur: NEGATIVE mg/dL
Leukocytes, UA: NEGATIVE
NITRITE: NEGATIVE
PH: 5 (ref 5.0–8.0)
Protein, ur: NEGATIVE mg/dL
SPECIFIC GRAVITY, URINE: 1.025 (ref 1.005–1.030)
Squamous Epithelial / LPF: NONE SEEN

## 2017-08-10 LAB — COMPREHENSIVE METABOLIC PANEL
ALBUMIN: 3.9 g/dL (ref 3.5–5.0)
ALK PHOS: 72 U/L (ref 38–126)
ALT: 19 U/L (ref 17–63)
ANION GAP: 9 (ref 5–15)
AST: 18 U/L (ref 15–41)
BILIRUBIN TOTAL: 0.4 mg/dL (ref 0.3–1.2)
BUN: 7 mg/dL (ref 6–20)
CALCIUM: 9.3 mg/dL (ref 8.9–10.3)
CO2: 25 mmol/L (ref 22–32)
Chloride: 103 mmol/L (ref 101–111)
Creatinine, Ser: 1.24 mg/dL (ref 0.61–1.24)
GFR calc Af Amer: >60 mL/min (ref >60)
GFR calc non Af Amer: >60 mL/min (ref >60)
GLUCOSE: 209 mg/dL — AB (ref 65–99)
Potassium: 4 mmol/L (ref 3.5–5.1)
Sodium: 137 mmol/L (ref 135–145)
TOTAL PROTEIN: 6.8 g/dL (ref 6.5–8.1)

## 2017-08-10 LAB — CBC
HCT: 42.4 % (ref 39.0–52.0)
Hemoglobin: 14.3 g/dL (ref 13.0–17.0)
MCH: 27.3 pg (ref 26.0–34.0)
MCHC: 33.7 g/dL (ref 30.0–36.0)
MCV: 80.9 fL (ref 78.0–100.0)
Platelets: 228 10*3/uL (ref 150–400)
RBC: 5.24 MIL/uL (ref 4.22–5.81)
RDW: 13.8 % (ref 11.5–15.5)
WBC: 8.8 10*3/uL (ref 4.0–10.5)

## 2017-08-10 LAB — CBG MONITORING, ED: Glucose-Capillary: 209 mg/dL — ABNORMAL HIGH (ref 65–99)

## 2017-08-10 LAB — LIPASE, BLOOD: Lipase: 35 U/L (ref 11–51)

## 2017-08-10 MED ORDER — PROMETHAZINE HCL 25 MG/ML IJ SOLN
25.0000 mg | Freq: Once | INTRAMUSCULAR | Status: AC
  Administered 2017-08-10: 25 mg via INTRAVENOUS
  Filled 2017-08-10: qty 1

## 2017-08-10 MED ORDER — GI COCKTAIL ~~LOC~~
30.0000 mL | Freq: Once | ORAL | Status: AC
  Administered 2017-08-10: 30 mL via ORAL
  Filled 2017-08-10: qty 30

## 2017-08-10 MED ORDER — SODIUM CHLORIDE 0.9 % IV BOLUS (SEPSIS)
1000.0000 mL | Freq: Once | INTRAVENOUS | Status: AC
  Administered 2017-08-10: 1000 mL via INTRAVENOUS

## 2017-08-10 MED ORDER — FAMOTIDINE IN NACL 20-0.9 MG/50ML-% IV SOLN
20.0000 mg | Freq: Once | INTRAVENOUS | Status: AC
  Administered 2017-08-10: 20 mg via INTRAVENOUS
  Filled 2017-08-10: qty 50

## 2017-08-10 NOTE — ED Notes (Signed)
ED Provider at bedside for evaluation. 

## 2017-08-10 NOTE — Discharge Instructions (Addendum)
Your work up today revealed that you have a lot of stool in your colon, which is likely causing some of your pain. See the instructions below regarding constipation.  You also likely have some component of indigestion/gastritis or an ulcer causing your symptoms. Continue taking the omeprazole you were given at your last visit. You will need to take zantac as directed, and avoid spicy/fatty/acidic foods, avoid soda/coffee/tea/alcohol. Avoid laying down flat within 30 minutes of eating. Avoid NSAIDs like ibuprofen/aleve/motrin/etc on an empty stomach. May consider using over the counter tums/maalox as needed for additional relief. Use phenergan as directed as needed for nausea. Use tylenol as needed for pain. Follow up with the [place] [facility] in 5-7 days for recheck of symptoms and to establish medical care. Return to the ER for changes or worsening symptoms.  For your constipation: You should start taking miralax as directed, try starting with using it 1-2 times daily until you achieve daily soft stools-- you can use more than 2 doses daily in order to achieve the first bowel movement and then cut back to 1-2 times daily or however often you need to take it in order to continue having the soft daily stools. May consider over the counter colace as well. Increase the fiber and water intake in your diet. See the information below regarding improving your bowel health. Follow up with the [place] [facility] center in 1 week for recheck of symptoms and ongoing medical care. Return to the ER for emergent changes or worsening symptoms.  GETTING TO GOOD BOWEL HEALTH. Irregular bowel habits such as constipation and diarrhea can lead to many problems over time.  Having one soft bowel movement a day is the most important way to prevent further problems.  The anorectal canal is designed to handle stretching and feces to safely manage our ability to get rid of solid waste (feces, poop, stool) out of  our body.  BUT, hard constipated stools can act like ripping concrete bricks and diarrhea can be a burning fire to this very sensitive area of our body, causing inflamed hemorrhoids, anal fissures, increasing risk is perirectal abscesses, abdominal pain/bloating, an making irritable bowel worse.     The goal: ONE SOFT BOWEL MOVEMENT A DAY!  To have soft, regular bowel movements:  Drink at least 8 tall glasses of water a day.   Take plenty of fiber.  Fiber is the undigested part of plant food that passes into the colon, acting s ?natures broom? to encourage bowel motility and movement.  Fiber can absorb and hold large amounts of water. This results in a larger, bulkier stool, which is soft and easier to pass. Work gradually over several weeks up to 6 servings a day of fiber (25g a day even more if needed) in the form of: Vegetables -- Root (potatoes, carrots, turnips), leafy green (lettuce, salad greens, celery, spinach), or cooked high residue (cabbage, broccoli, etc) Fruit -- Fresh (unpeeled skin & pulp), Dried (prunes, apricots, cherries, etc ),  or stewed ( applesauce)  Whole grain breads, pasta, etc (whole wheat)  Bran cereals  Bulking Agents -- This type of water-retaining fiber generally is easily obtained each day by one of the following:  Psyllium bran -- The psyllium plant is remarkable because its ground seeds can retain so much water. This product is available as Metamucil, Konsyl, Effersyllium, Per Diem Fiber, or the less expensive generic preparation in drug and health food stores. Although labeled a laxative, it really is  not a laxative.  Methylcellulose -- This is another fiber derived from wood which also retains water. It is available as Citrucel. Polyethylene Glycol - and ?artificial? fiber commonly called Miralax or Glycolax.  It is helpful for people with gassy or bloated feelings with regular fiber Flax Seed - a less gassy fiber than psyllium No reading or other relaxing activity  while on the toilet. If bowel movements take longer than 5 minutes, you are too constipated AVOID CONSTIPATION.  High fiber and water intake usually takes care of this.  Sometimes a laxative is needed to stimulate more frequent bowel movements, but  Laxatives are not a good long-term solution as it can wear the colon out. Osmotics (Milk of Magnesia, Fleets phosphosoda, Magnesium citrate, MiraLax, GoLytely) are safer than  Stimulants (Senokot, Castor Oil, Dulcolax, Ex Lax)    Do not take laxatives for more than 7days in a row.  IF SEVERELY CONSTIPATED, try a Bowel Retraining Program: Do not use laxatives.  Eat a diet high in roughage, such as bran cereals and leafy vegetables.  Drink six (6) ounces of prune or apricot juice each morning.  Eat two (2) large servings of stewed fruit each day.  Take one (1) heaping tablespoon of a psyllium-based bulking agent twice a day. Use sugar-free sweetener when possible to avoid excessive calories.  Eat a normal breakfast.  Set aside 15 minutes after breakfast to sit on the toilet, but do not strain to have a bowel movement.  If you do not have a bowel movement by the third day, use an enema and repeat the above steps.  Controlling diarrhea Switch to liquids and simpler foods for a few days to avoid stressing your intestines further. Avoid dairy products (especially milk & ice cream) for a short time.  The intestines often can lose the ability to digest lactose when stressed. Avoid foods that cause gassiness or bloating.  Typical foods include beans and other legumes, cabbage, broccoli, and dairy foods.  Every person has some sensitivity to other foods, so listen to our body and avoid those foods that trigger problems for you. Adding fiber (Citrucel, Metamucil, psyllium, Miralax) gradually can help thicken stools by absorbing excess fluid and retrain the intestines to act more normally.  Slowly increase the dose over a few weeks.  Too much fiber too soon can  backfire and cause cramping & bloating. Probiotics (such as active yogurt, Align, etc) may help repopulate the intestines and colon with normal bacteria and calm down a sensitive digestive tract.  Most studies show it to be of mild help, though, and such products can be costly. Medicines: Bismuth subsalicylate (ex. Kayopectate, Pepto Bismol) every 30 minutes for up to 6 doses can help control diarrhea.  Avoid if pregnant. Loperamide (Immodium) can slow down diarrhea.  Start with two tablets (4mg  total) first and then try one tablet every 6 hours.  Avoid if you are having fevers or severe pain.  If you are not better or start feeling worse, stop all medicines and call your doctor for advice Call your doctor if you are getting worse or not better.  Sometimes further testing (cultures, endoscopy, X-ray studies, bloodwork, etc) may be needed to help diagnose and treat the cause of the diarrhea.  Managing Pain  Pain after surgery or related to activity is often due to strain/injury to muscle, tendon, nerves and/or incisions.  This pain is usually short-term and will improve in a few months.   Many people find it helpful to  do the following things TOGETHER to help speed the process of healing and to get back to regular activity more quickly:  Avoid heavy physical activity  no lifting greater than 20 pounds Do not ?push through? the pain.  Listen to your body and avoid positions and maneuvers than reproduce the pain Walking is okay as tolerated, but go slowly and stop when getting sore.  Remember: If it hurts to do it, then don?t do it! Take Anti-inflammatory medication  Take with food/snack around the clock for 1-2 weeks This helps the muscle and nerve tissues become less irritable and calm down faster Choose ONE of the following over-the-counter medications: Naproxen 220mg  tabs (ex. Aleve) 1-2 pills twice a day  Ibuprofen 200mg  tabs (ex. Advil, Motrin) 3-4 pills with every meal and just before  bedtime Acetaminophen 500mg  tabs (Tylenol) 1-2 pills with every meal and just before bedtime Use a Heating pad or Ice/Cold Pack 4-6 times a day May use warm bath/hottub  or showers Try Gentle Massage and/or Stretching  at the area of pain many times a day stop if you feel pain - do not overdo it  Try these steps together to help you body heal faster and avoid making things get worse.  Doing just one of these things may not be enough.    If you are not getting better after two weeks or are noticing you are getting worse, contact our office for further advice; we may need to re-evaluate you & see what other things we can do to help.

## 2017-08-10 NOTE — ED Triage Notes (Signed)
Pt states he has had abdominal pain for several weeks. He reports nausea and vomiting. He states he was seen at [facility] without improvement of symptoms. Pt reports stool has been hard.

## 2017-08-10 NOTE — ED Notes (Signed)
POC CBG 209. RN notified.

## 2017-08-10 NOTE — ED Notes (Signed)
Pt remains in waiting room. Updated on wait for treatment room. 

## 2017-08-10 NOTE — ED Provider Notes (Signed)
[facility] EMERGENCY DEPARTMENT Provider Note   CSN: [id number] Arrival date & time: 08/10/17  1334     History   Chief Complaint Chief Complaint  Patient presents with  . Abdominal Pain    HPI Charles KOHLMANN is a 56 y.o. male with a PMHx of GERD, chronic low back pain, GERD, HTN, HLD, DM2, MI, schizophrenia, and other conditions listed below, and PSHx of inguinal hernia repair in 1989, who presents to the ED with complaints of ongoing abdominal pain which he states has been present for 2 weeks. Per chart review, pt has been seen 3 times in the ED for similar complaints over the last month; he was seen on 07/07/17 and had a CT abd/pelv which was essentially unremarkable; had reassuring labs on 08/04/17 and discharged with omeprazole and bentyl; and had reassuring labs on 08/05/17 and was discharged home with rx for zofran.  He states that despite taking omeprazole and Zofran his symptoms have persisted, so he presented today for repeat evaluation.  He reports that ever since having a hamburger 2 weeks ago, he has been ill since then.  He describes his pain as 10/10 intermittent sharp LLQ pain that radiates into the epigastric area, worse with eating, and unrelieved with Pepto-Bismol, omeprazole, and Zofran.  He never took the Bentyl that was prescribed him on 08/04/17.  Associated symptoms includes nausea and vomiting, he is unsure of how often he vomits however he states that it is nonbloody and nonbilious and usually occurs from the hours of 3 AM to 6 AM and then he "gags" until mid day.  He also has noticed that his bowel movements have been hard however he continues to have regular bowel movements ~1-2 times per day every day.  He mentions that he is homeless and is staying at [facility], is not sure whether he could have come into contact with someone sick.  He denies fevers, chills, CP, SOB, diarrhea, obstipation, melena, hematochezia, hematemesis, hematuria, dysuria,  testicular pain/swelling, penile discharge, myalgias, arthralgias, numbness, tingling, focal weakness, or any other complaints at this time. Denies recent travel, definite sick contacts, EtOH use, NSAID use, or other prior abd surgeries aside from remote inguinal hernia repair.    The history is provided by the patient and medical records. No language interpreter was used.  Abdominal Pain   This is a recurrent problem. The current episode started more than 1 week ago. The problem occurs daily. The problem has not changed since onset.The pain is associated with an unknown factor. The pain is located in the LLQ and epigastric region. The quality of the pain is sharp. The pain is at a severity of 10/10. The pain is moderate. Associated symptoms include nausea, vomiting and constipation (+hard BMs but having regular BMs 1-2x/day). Pertinent negatives include fever, diarrhea, flatus, hematochezia, melena, dysuria, hematuria, arthralgias and myalgias. The symptoms are aggravated by eating. Nothing relieves the symptoms. Past workup includes CT scan. His past medical history is significant for GERD.    Past Medical History:  Diagnosis Date  . Anxiety   . Arthritis    "back" (06/12/2017)  . Bipolar disorder (HCC)   . CAP (community acquired pneumonia) 06/10/2017  . Chronic lower back pain   . Degenerative disc disease, lumbar   . Depression   . Fibromyalgia   . GERD (gastroesophageal reflux disease)   . High cholesterol   . Hypertension   . Myocardial infarction (HCC) 2002  . Pneumonia 1989  . Schizophrenia (  HCC)   . Type II diabetes mellitus [facility])     Patient Active Problem List   Diagnosis Date Noted  . Chest pain 07/10/2017  . Angina pectoris (HCC) 07/09/2017  . Tobacco abuse 07/09/2017  . Hypertension 07/09/2017  . Type II diabetes mellitus (HCC)   . GERD (gastroesophageal reflux disease)   . Chronic lower back pain   . Poorly controlled diabetes mellitus (HCC)   . Hyperlipidemia  with target LDL less than 70   . Schizoaffective disorder, bipolar type (HCC) 06/16/2017  . Depression 06/15/2017  . Other schizophrenia (HCC)   . Pneumonia 06/11/2017  . Community acquired pneumonia of right lower lobe of lung (HCC)   . Hyperglycemia   . Localized edema   . Shortness of breath 06/10/2017    Past Surgical History:  Procedure Laterality Date  . CARDIAC CATHETERIZATION  12/2006   [name] 01/18/2011  . INGUINAL HERNIA REPAIR Right   . LEFT HEART CATH AND CORONARY ANGIOGRAPHY N/A 07/11/2017   Procedure: LEFT HEART CATH AND CORONARY ANGIOGRAPHY;  Surgeon: Tonny Bollman, MD;  Location: [facility] INVASIVE CV LAB;  Service: Cardiovascular;  Laterality: N/A;  . PARATHYROIDECTOMY    . PERICARDIAL TAP  2000   ?; in [place]/notes 01/18/2011       Home Medications    Prior to Admission medications   Medication Sig Start Date End Date Taking? Authorizing Provider  acetaminophen (TYLENOL) 500 MG tablet Take 1,000 mg by mouth every 6 (six) hours as needed for moderate pain.    [provider]  aspirin 81 MG EC tablet Take 1 tablet (81 mg total) by mouth daily. For heart health 06/26/17   Armandina Stammer I, NP  atorvastatin (LIPITOR) 40 MG tablet Take 1 tablet (40 mg total) by mouth daily. For high Cholesterol 06/26/17   Armandina Stammer I, NP  dicyclomine (BENTYL) 20 MG tablet Take 1 tablet (20 mg total) by mouth 2 (two) times daily. 08/04/17   Petrucelli, Samantha R, PA-C  gabapentin (NEURONTIN) 300 MG capsule Take 1 capsule (300 mg total) by mouth 2 (two) times daily. For agitation 06/26/17   Armandina Stammer I, NP  hydrochlorothiazide (HYDRODIURIL) 25 MG tablet Take 1 tablet (25 mg total) by mouth daily. For high blood pressure 06/26/17   Armandina Stammer I, NP  hydrOXYzine (ATARAX/VISTARIL) 25 MG tablet Take 1 tablet (25 mg total) by mouth 3 (three) times daily as needed for anxiety. 06/26/17   Armandina Stammer I, NP  insulin aspart protamine- aspart (NOVOLOG MIX 70/30) (70-30) 100 UNIT/ML  injection Inject 0.45-0.6 mLs (45-60 Units total) into the skin 2 (two) times daily with a meal. 60 units in the morning and 45 units at dinner time 07/28/17   Mancel Bale, MD  lidocaine (LIDODERM) 5 % Place 1 patch onto the skin daily. Remove & Discard patch within 12 hours or as directed by MD: For pain management Patient not taking: Reported on 07/07/2017 06/27/17   Armandina Stammer I, NP  metoprolol tartrate (LOPRESSOR) 25 MG tablet Take 1 tablet (25 mg total) by mouth 2 (two) times daily. For high blood pressure 06/26/17   Armandina Stammer I, NP  Multiple Vitamin (MULTIVITAMIN WITH MINERALS) TABS tablet Take 1 tablet by mouth daily. For Vitamin supplement 06/26/17   Armandina Stammer I, NP  nicotine (NICODERM CQ - DOSED IN MG/24 HOURS) 14 mg/24hr patch Place 1 patch (14 mg total) onto the skin daily. (May purchase over the counter): For smoking cessation Patient not taking: Reported on 07/07/2017 06/27/17  Armandina Stammer I, NP  nitroGLYCERIN (NITROSTAT) 0.4 MG SL tablet Place 1 tablet (0.4 mg total) every 5 (five) minutes as needed under the tongue for chest pain. Patient not taking: Reported on 07/28/2017 07/11/17   Joseph Art, DO  omeprazole (PRILOSEC) 20 MG capsule Take 1 capsule (20 mg total) by mouth daily. 08/05/17   Fayrene Helper, PA-C  ondansetron (ZOFRAN) 4 MG tablet Take 1 tablet (4 mg total) by mouth every 6 (six) hours. 08/05/17   Fayrene Helper, PA-C  risperiDONE (RISPERDAL) 1 MG tablet Take 1 tablet (1 mg) by mouth in the mornings & 2 tablets (2 mg) at bedtime: For mood control Patient taking differently: Take 1-2 mg by mouth 2 (two) times daily. Take 1 tablet (1 mg) by mouth in the mornings & 2 tablets (2 mg) at bedtime: For mood control 06/26/17   Armandina Stammer I, NP  sertraline (ZOLOFT) 50 MG tablet Take 1 tablet (50 mg total) by mouth daily. For depression 06/27/17   Armandina Stammer I, NP  traZODone (DESYREL) 100 MG tablet Take 1 tablet (100 mg total) by mouth at bedtime as needed for sleep.  06/26/17   Sanjuana Kava, NP    Family History Family History  Problem Relation Age of Onset  . Hypertension Mother   . Diabetes Mother   . Heart disease Father   . Hypertension Sister   . Diabetes Sister   . Hypertension Brother   . Diabetes Brother   . Hypertension Sister   . Diabetes Sister   . Hypertension Brother   . Diabetes Brother     Social History Social History   Tobacco Use  . Smoking status: Current Every Day Smoker    Packs/day: 0.50    Years: 38.00    Pack years: 19.00    Types: Cigarettes  . Smokeless tobacco: Current User    Types: Chew  Substance Use Topics  . Alcohol use: Yes    Comment: 06/12/2017 "stopped ~ 1 yr ago"   . Drug use: Yes    Types: Cocaine    Comment: 06/12/2017 "stopped using cocaine ~ 1 yr ago; used marijuana when I was younger" last cocaine use 9 mths ago per pt on 06/15/17     Allergies   Patient has no known allergies.   Review of Systems Review of Systems  Constitutional: Negative for chills and fever.  Respiratory: Negative for shortness of breath.   Cardiovascular: Negative for chest pain.  Gastrointestinal: Positive for abdominal pain, constipation (+hard BMs but having regular BMs 1-2x/day), nausea and vomiting. Negative for blood in stool, diarrhea, flatus, hematochezia and melena.  Genitourinary: Negative for discharge, dysuria, hematuria, scrotal swelling and testicular pain.  Musculoskeletal: Negative for arthralgias and myalgias.  Skin: Negative for color change.  Allergic/Immunologic: Positive for immunocompromised state (DM2).  Neurological: Negative for weakness and numbness.  Psychiatric/Behavioral: Negative for confusion.   All other systems reviewed and are negative for acute change except as noted in the HPI.    Physical Exam Updated Vital Signs BP 131/81   Pulse 82   Temp (!) 97.4 F (36.3 C) (Oral)   Resp (!) 24   SpO2 94%   Physical Exam  Constitutional: He is oriented to person, place, and  time. Vital signs are normal. He appears well-developed and well-nourished.  Non-toxic appearance. No distress.  Afebrile, nontoxic, NAD  HENT:  Head: Normocephalic and atraumatic.  Mouth/Throat: Oropharynx is clear and moist and mucous membranes are normal.  Eyes: Conjunctivae and  EOM are normal. Right eye exhibits no discharge. Left eye exhibits no discharge.  Neck: Normal range of motion. Neck supple.  Cardiovascular: Normal rate, regular rhythm, normal heart sounds and intact distal pulses. Exam reveals no gallop and no friction rub.  No murmur heard. Pulmonary/Chest: Effort normal and breath sounds normal. No respiratory distress. He has no decreased breath sounds. He has no wheezes. He has no rhonchi. He has no rales.  Abdominal: Soft. Normal appearance and bowel sounds are normal. He exhibits no distension. There is tenderness in the epigastric area, left upper quadrant and left lower quadrant. There is no rigidity, no rebound, no guarding, no CVA tenderness, no tenderness at McBurney's point and negative Murphy's sign.  Soft, nondistended, +BS throughout, with mild LLQ, LUQ, and epigastric TTP, no r/g/r, neg murphy's, neg mcburney's, no CVA TTP   Musculoskeletal: Normal range of motion.  Neurological: He is alert and oriented to person, place, and time. He has normal strength. No sensory deficit.  Skin: Skin is warm, dry and intact. No rash noted.  Psychiatric: He has a normal mood and affect.  Nursing note and vitals reviewed.    ED Treatments / Results  Labs (all labs ordered are listed, but only abnormal results are displayed) Labs Reviewed  COMPREHENSIVE METABOLIC PANEL - Abnormal; Notable for the following components:      Result Value   Glucose, Bld 209 (*)    All other components within normal limits  URINALYSIS, ROUTINE W REFLEX MICROSCOPIC - Abnormal; Notable for the following components:   Glucose, UA >=500 (*)    Hgb urine dipstick SMALL (*)    All other components  within normal limits  CBG MONITORING, ED - Abnormal; Notable for the following components:   Glucose-Capillary 209 (*)    All other components within normal limits  LIPASE, BLOOD  CBC    EKG  EKG Interpretation None       Radiology Dg Abd Acute W/chest  Result Date: 08/10/2017 CLINICAL DATA:  56 year old male with left-sided chest and upper abdominal pain. Constipation. Concern for obstruction. EXAM: DG ABDOMEN ACUTE W/ 1V CHEST COMPARISON:  Chest radiograph dated 07/09/2017 and CT of the abdomen pelvis dated 07/07/2017 FINDINGS: Mild eventration of the right hemidiaphragm with right lung base linear atelectasis/ scarring. Mild bronchiectatic changes noted. There is no focal consolidation, pleural effusion, or pneumothorax. The cardiac silhouette is within normal limits. There is moderate to large amount of colonic stool burden. No bowel dilatation or evidence of obstruction. No free air or radiopaque calculi. The osseous structures and soft tissues are grossly unremarkable. IMPRESSION: 1. No acute cardiopulmonary process. 2. Moderate to large colonic stool burden. No evidence of bowel obstruction. Electronically Signed   By: Elgie CollardArash  Radparvar M.D.   On: 08/10/2017 21:22    07/07/17 CT Abd/Pelv Study Result: CLINICAL DATA:  56 year old male with abdominal pain.  EXAM: CT ABDOMEN AND PELVIS WITH CONTRAST  TECHNIQUE: Multidetector CT imaging of the abdomen and pelvis was performed using the standard protocol following bolus administration of intravenous contrast.  CONTRAST:  <See Chart> ISOVUE-300 IOPAMIDOL (ISOVUE-300) INJECTION 61%  COMPARISON:  None.  FINDINGS: Lower chest: There is mild eventration of the right hemidiaphragm. Right lung base linear atelectatic changes/scarring. Pneumonia is not entirely excluded. Clinical correlation is recommended. There is mild bilateral lower lobe bronchiectatic changes.  There is no intra-abdominal free air or free  fluid.  Hepatobiliary: There is apparent mild fatty infiltration of the liver. No intrahepatic biliary ductal dilatation. The gallbladder is  unremarkable.  Pancreas: Unremarkable. No pancreatic ductal dilatation or surrounding inflammatory changes.  Spleen: Normal in size without focal abnormality.  Adrenals/Urinary Tract: The adrenal glands are unremarkable. There is a 2.5 cm left renal inferior pole cyst. There is no hydronephrosis on either side. There is symmetric enhancement and excretion of contrast by both kidneys. The visualized ureters and urinary bladder appear unremarkable.  Stomach/Bowel: There is no evidence of bowel obstruction or active inflammation. Normal appendix.  Vascular/Lymphatic: Mild aortoiliac atherosclerotic disease. The IVC is grossly unremarkable. No portal venous gas. There is no adenopathy.  Reproductive: The prostate and seminal vesicles are grossly unremarkable.  Other: Small fat containing umbilical hernia. No fluid collection or inflammatory changes.  Musculoskeletal: No acute or significant osseous findings.  IMPRESSION: 1. No acute intra-abdominal or pelvic pathology. No bowel obstruction or active inflammation. Normal appendix. 2. Mild fatty liver. 3. Mild eventration of the right hemidiaphragm with right lung base linear atelectasis/scarring. Mild bilateral lower lobe bronchiectatic changes.   Electronically Signed   By: Elgie CollardArash  Radparvar M.D.   On: 07/07/2017 23:08     Procedures Procedures (including critical care time)  Medications Ordered in ED Medications  gi cocktail (Maalox,Lidocaine,Donnatal) (30 mLs Oral Given 08/10/17 2218)  famotidine (PEPCID) IVPB 20 mg premix (0 mg Intravenous Stopped 08/10/17 2254)  sodium chloride 0.9 % bolus 1,000 mL (0 mLs Intravenous Stopped 08/11/17 0059)  promethazine (PHENERGAN) injection 25 mg (25 mg Intravenous Given 08/10/17 2218)  LORazepam (ATIVAN) injection 1 mg (1 mg  Intravenous Given 08/11/17 0132)  ondansetron (ZOFRAN) injection 4 mg (4 mg Intravenous Given 08/11/17 0132)     Initial Impression / Assessment and Plan / ED Course  I have reviewed the triage vital signs and the nursing notes.  Pertinent labs & imaging results that were available during my care of the patient were reviewed by me and considered in my medical decision making (see chart for details).     56 y.o. male here with ongoing abd pain that has been persisting for 2 weeks per his report, however based on chart review it's been at least a month (has been seen 3 times in 1 month). Has had multiple reassuring work ups, including CTAP on 07/07/17 which did not find any acute reason for his abdominal pain (also notable, no mention of diverticula; did have small L renal cyst on inferior pole). States n/v mostly in the mornings, and having hard BMs. Pt is homeless living at [facility]. On exam, appears in NAD, mild TTP to LLQ, LUQ, and epigastrum, nonperitoneal, adequate bowel sounds throughout. Work up thus far reveals: U/A unremarkable, lipase WNL, CMP with gluc 209 but otherwise WNL, CBC WNL. I suspect a degree of GERD/gastritis/PUD as well as component of constipation. Will get acute abd series to r/o obstruction and to evaluate for stool burden. Will give pepcid, GI cocktail, phenergan, and fluids, then reassess shortly.   1:51 AM Acute abd series showing moderate colonic stool burden, doesn't appear to be much in the rectum, mostly in the ascending and descending colons; doubt disimpaction here would be beneficial. Pt initially not feeling tremendously better, started dry heaving, however given ativan and zofran and now feeling better and has tolerated PO well while here. I suspect a component of GERD/PUD/gastritis, will start on zantac in addition to his omeprazole.  Discussed diet/lifestyle modifications for symptoms, advised tylenol and avoidance/sparing use of NSAIDs only on full stomach,  discussed other OTC remedies for symptomatic relief. Will rx phenergan since this helped with  his nausea here. Will also start on miralax, advised increased fiber/water intake, and other OTC remedies for additional relief of constipation. F/up with [facility] in 1wk for recheck of symptoms and to establish medical care. I explained the diagnosis and have given explicit precautions to return to the ER including for any other new or worsening symptoms. The patient understands and accepts the medical plan as it's been dictated and I have answered their questions. Discharge instructions concerning home care and prescriptions have been given. The patient is STABLE and is discharged to home in good condition.    Final Clinical Impressions(s) / ED Diagnoses   Final diagnoses:  Abdominal pain, unspecified abdominal location  Nausea and vomiting in adult patient  Gastroesophageal reflux disease, esophagitis presence not specified  Constipation, unspecified constipation type  Type 2 diabetes mellitus with hyperglycemia, with long-term current use of insulin [facility])    ED Discharge Orders        Ordered    ranitidine (ZANTAC) 150 MG tablet  2 times daily     08/11/17 0150    promethazine (PHENERGAN) 25 MG tablet  Every 6 hours PRN     08/11/17 0150    polyethylene glycol (MIRALAX / GLYCOLAX) packet  Daily     08/11/17 [address] [place], PA-C 08/11/17 0151    Vanetta Mulders, MD 08/11/17 1527

## 2017-08-10 NOTE — ED Notes (Signed)
Pt requesting to have blood sugar checked.

## 2017-08-10 NOTE — ED Notes (Signed)
Pt returned from Radiology at this time.

## 2017-08-10 NOTE — ED Notes (Signed)
Pt taken to radiology at this time.

## 2017-08-11 MED ORDER — LORAZEPAM 2 MG/ML IJ SOLN
1.0000 mg | Freq: Once | INTRAMUSCULAR | Status: AC
  Administered 2017-08-11: 1 mg via INTRAVENOUS
  Filled 2017-08-11: qty 1

## 2017-08-11 MED ORDER — RANITIDINE HCL 150 MG PO TABS: 150.0000 mg | ORAL_TABLET | Freq: Two times a day (BID) | ORAL | 0 refills | Status: DC

## 2017-08-11 MED ORDER — PROMETHAZINE HCL 25 MG PO TABS: 25.0000 mg | ORAL_TABLET | Freq: Four times a day (QID) | ORAL | 0 refills | Status: DC | PRN

## 2017-08-11 MED ORDER — ONDANSETRON HCL 4 MG/2ML IJ SOLN
4.0000 mg | Freq: Once | INTRAMUSCULAR | Status: AC
  Administered 2017-08-11: 4 mg via INTRAVENOUS
  Filled 2017-08-11: qty 2

## 2017-08-11 MED ORDER — POLYETHYLENE GLYCOL 3350 17 G PO PACK: 17.0000 g | PACK | Freq: Every day | ORAL | 0 refills | Status: DC

## 2017-08-11 NOTE — ED Notes (Signed)
Patient refusing to leave due to being sicker than when he arrived.  Patient was given discharge instructions and follow up with PCP.  Patient states that we have done nothing for him.  Patient was given fluids and medications to help with his constipation.  Patient given prescriptions to be filled with his discharge.  Patient continues not to be happy with care.  Patient escorted from ED with security and [employer].

## 2017-08-11 NOTE — ED Notes (Signed)
Pt requesting to speak to my supervisor... Woody Consulting civil engineerCharge RN en route to speak to pt

## 2017-08-11 NOTE — ED Notes (Signed)
Pt ambulatory to waiting room with [employer] and security. Pt to leave lobby

## 2017-08-11 NOTE — ED Notes (Signed)
Pt requesting taxi voucher, informed him none to be given at this time.

## 2017-08-11 NOTE — ED Notes (Signed)
Pt refusing to leave, security at bedside. 

## 2017-08-16 ENCOUNTER — Other Ambulatory Visit: Payer: Self-pay | Admitting: Family Medicine

## 2017-08-21 ENCOUNTER — Other Ambulatory Visit: Payer: Self-pay | Admitting: Family Medicine

## 2017-08-21 MED FILL — NOVOLOG MIX 70/30 VIAL: (70-30) 100 | 18 days supply | Qty: 20 | Fill #1

## 2017-09-26 ENCOUNTER — Emergency Department (HOSPITAL_COMMUNITY): Payer: Medicaid Other

## 2017-09-26 ENCOUNTER — Emergency Department (HOSPITAL_COMMUNITY)
Admission: EM | Admit: 2017-09-26 | Discharge: 2017-09-26 | Disposition: A | Discharge status: Home / Self Care | Payer: Medicaid Other | Attending: Emergency Medicine | Admitting: Emergency Medicine

## 2017-09-26 ENCOUNTER — Encounter (HOSPITAL_COMMUNITY): Payer: Self-pay | Admitting: *Deleted

## 2017-09-26 DIAGNOSIS — R739 Hyperglycemia, unspecified: Secondary | ICD-10-CM

## 2017-09-26 DIAGNOSIS — R079 Chest pain, unspecified: Secondary | ICD-10-CM | POA: Diagnosis present

## 2017-09-26 DIAGNOSIS — I10 Essential (primary) hypertension: Secondary | ICD-10-CM | POA: Insufficient documentation

## 2017-09-26 DIAGNOSIS — R42 Dizziness and giddiness: Secondary | ICD-10-CM | POA: Diagnosis not present

## 2017-09-26 DIAGNOSIS — F17210 Nicotine dependence, cigarettes, uncomplicated: Secondary | ICD-10-CM | POA: Insufficient documentation

## 2017-09-26 DIAGNOSIS — E1165 Type 2 diabetes mellitus with hyperglycemia: Secondary | ICD-10-CM | POA: Insufficient documentation

## 2017-09-26 DIAGNOSIS — Z7982 Long term (current) use of aspirin: Secondary | ICD-10-CM | POA: Insufficient documentation

## 2017-09-26 DIAGNOSIS — Z79899 Other long term (current) drug therapy: Secondary | ICD-10-CM | POA: Insufficient documentation

## 2017-09-26 DIAGNOSIS — Z794 Long term (current) use of insulin: Secondary | ICD-10-CM | POA: Diagnosis not present

## 2017-09-26 LAB — CBC WITH DIFFERENTIAL/PLATELET
BASOS ABS: 0.1 10*3/uL (ref 0.0–0.1)
BASOS PCT: 1 %
EOS PCT: 1 %
Eosinophils Absolute: 0.1 10*3/uL (ref 0.0–0.7)
HCT: 45.8 % (ref 39.0–52.0)
Hemoglobin: 15.7 g/dL (ref 13.0–17.0)
LYMPHS PCT: 27 %
Lymphs Abs: 2.1 10*3/uL (ref 0.7–4.0)
MCH: 27.6 pg (ref 26.0–34.0)
MCHC: 34.3 g/dL (ref 30.0–36.0)
MCV: 80.5 fL (ref 78.0–100.0)
MONO ABS: 0.4 10*3/uL (ref 0.1–1.0)
MONOS PCT: 5 %
Neutro Abs: 5 10*3/uL (ref 1.7–7.7)
Neutrophils Relative %: 66 %
PLATELETS: 240 10*3/uL (ref 150–400)
RBC: 5.69 MIL/uL (ref 4.22–5.81)
RDW: 13.7 % (ref 11.5–15.5)
WBC: 7.7 10*3/uL (ref 4.0–10.5)

## 2017-09-26 LAB — COMPREHENSIVE METABOLIC PANEL
ALT: 17 U/L (ref 17–63)
AST: 18 U/L (ref 15–41)
Albumin: 4 g/dL (ref 3.5–5.0)
Alkaline Phosphatase: 94 U/L (ref 38–126)
Anion gap: 13 (ref 5–15)
BILIRUBIN TOTAL: 0.7 mg/dL (ref 0.3–1.2)
BUN: 14 mg/dL (ref 6–20)
CHLORIDE: 104 mmol/L (ref 101–111)
CO2: 18 mmol/L — ABNORMAL LOW (ref 22–32)
Calcium: 9.1 mg/dL (ref 8.9–10.3)
Creatinine, Ser: 1.3 mg/dL — ABNORMAL HIGH (ref 0.61–1.24)
GFR calc Af Amer: >60 mL/min (ref >60)
GFR, EST NON AFRICAN AMERICAN: 60 mL/min — AB (ref >60)
Glucose, Bld: 317 mg/dL — ABNORMAL HIGH (ref 65–99)
Potassium: 4.8 mmol/L (ref 3.5–5.1)
Sodium: 135 mmol/L (ref 135–145)
TOTAL PROTEIN: 7.2 g/dL (ref 6.5–8.1)

## 2017-09-26 LAB — I-STAT TROPONIN, ED
TROPONIN I, POC: 0.01 ng/mL (ref 0.00–0.08)
Troponin i, poc: 0 ng/mL (ref 0.00–0.08)

## 2017-09-26 LAB — PROTIME-INR
INR: 1.07
Prothrombin Time: 13.9 seconds (ref 11.4–15.2)

## 2017-09-26 LAB — D-DIMER, QUANTITATIVE: D-Dimer, Quant: 0.36 ug/mL-FEU (ref 0.00–0.50)

## 2017-09-26 LAB — LIPASE, BLOOD: LIPASE: 66 U/L — AB (ref 11–51)

## 2017-09-26 MED ORDER — MECLIZINE HCL 25 MG PO TABS: 25.0000 mg | ORAL_TABLET | Freq: Once | ORAL | Status: DC

## 2017-09-26 MED ORDER — MECLIZINE HCL 25 MG PO TABS: 25.0000 mg | ORAL_TABLET | Freq: Three times a day (TID) | ORAL | 0 refills | Status: DC | PRN

## 2017-09-26 MED ORDER — INSULIN ASPART 100 UNIT/ML ~~LOC~~ SOLN
5.0000 [IU] | Freq: Once | SUBCUTANEOUS | Status: AC
  Administered 2017-09-26: 5 [IU] via SUBCUTANEOUS
  Filled 2017-09-26: qty 1

## 2017-09-26 MED ORDER — NITROGLYCERIN 0.4 MG SL SUBL: 0.4000 mg | SUBLINGUAL_TABLET | SUBLINGUAL | Status: DC | PRN

## 2017-09-26 MED ORDER — METOPROLOL TARTRATE 25 MG PO TABS: 25.0000 mg | ORAL_TABLET | Freq: Two times a day (BID) | ORAL | 0 refills | Status: DC

## 2017-09-26 MED ORDER — INSULIN ASPART PROT & ASPART (70-30 MIX) 100 UNIT/ML ~~LOC~~ SUSP
45.0000 [IU] | Freq: Two times a day (BID) | SUBCUTANEOUS | 0 refills | Status: DC
Start: 2017-09-26 — End: 2017-10-18

## 2017-09-26 MED ORDER — HYDROCHLOROTHIAZIDE 25 MG PO TABS: 25.0000 mg | ORAL_TABLET | Freq: Every day | ORAL | 0 refills | Status: DC

## 2017-09-26 MED ORDER — MECLIZINE HCL 25 MG PO TABS
25.0000 mg | ORAL_TABLET | Freq: Once | ORAL | Status: AC
  Administered 2017-09-26: 25 mg via ORAL
  Filled 2017-09-26: qty 1

## 2017-09-26 MED ORDER — ASPIRIN 325 MG PO TABS
325.0000 mg | ORAL_TABLET | Freq: Once | ORAL | Status: AC
Start: 2017-09-26 — End: 2017-09-26
  Administered 2017-09-26: 325 mg via ORAL
  Filled 2017-09-26: qty 1

## 2017-09-26 MED ORDER — SODIUM CHLORIDE 0.9 % IV BOLUS (SEPSIS)
1000.0000 mL | Freq: Once | INTRAVENOUS | Status: AC
  Administered 2017-09-26: 1000 mL via INTRAVENOUS

## 2017-09-26 NOTE — ED Notes (Signed)
Patient in MRI. Unable to complete MRI at this time

## 2017-09-26 NOTE — ED Notes (Signed)
Patient back from MRI. No reports of pain.

## 2017-09-26 NOTE — Discharge Instructions (Signed)
Take meclizine as needed for dizziness.   Take your BP meds as prescribed.   Take novolog as prescribed.   See your primary care doctor and cardiologist.   See neurologist if you have worse dizziness.   Return to ER if you have worse dizziness, trouble walking, weakness, trouble with speech, vomiting, chest pain, trouble breathing

## 2017-09-26 NOTE — ED Notes (Signed)
Patient transported to X-ray 

## 2017-09-26 NOTE — ED Provider Notes (Signed)
[facility] EMERGENCY DEPARTMENT Provider Note   CSN: [id number] Arrival date & time: 09/26/17  1056     History   Chief Complaint Chief Complaint  Patient presents with  . Dizziness    HPI Charles Orr is a 57 y.o. male history of previous MI, diabetes, hypertension, nonobstructive CAD here presenting with chest pain, dizziness.  Patient has intermittent substernal and right-sided chest pain that is going on for the last several weeks.  Patient states that chest pain has been progressively worsening and occasionally pleuritic in nature.  States that he has 10/10 chest pain currently. He woke up this morning and felt the room was spinning and had to close his eyes.  Denies any headaches or vomiting or fevers or neck pain.   The history is provided by the patient.    Past Medical History:  Diagnosis Date  . Anxiety   . Arthritis    "back" (06/12/2017)  . Bipolar disorder (HCC)   . CAP (community acquired pneumonia) 06/10/2017  . Chronic lower back pain   . Degenerative disc disease, lumbar   . Depression   . Fibromyalgia   . GERD (gastroesophageal reflux disease)   . High cholesterol   . Hypertension   . Myocardial infarction (HCC) 2002  . Pneumonia 1989  . Schizophrenia (HCC)   . Type II diabetes mellitus [facility])     Patient Active Problem List   Diagnosis Date Noted  . Chest pain 07/10/2017  . Angina pectoris (HCC) 07/09/2017  . Tobacco abuse 07/09/2017  . Hypertension 07/09/2017  . Type II diabetes mellitus (HCC)   . GERD (gastroesophageal reflux disease)   . Chronic lower back pain   . Poorly controlled diabetes mellitus (HCC)   . Hyperlipidemia with target LDL less than 70   . Schizoaffective disorder, bipolar type (HCC) 06/16/2017  . Depression 06/15/2017  . Other schizophrenia (HCC)   . Pneumonia 06/11/2017  . Community acquired pneumonia of right lower lobe of lung (HCC)   . Hyperglycemia   . Localized edema   . Shortness of breath  06/10/2017    Past Surgical History:  Procedure Laterality Date  . CARDIAC CATHETERIZATION  12/2006   [name] 01/18/2011  . INGUINAL HERNIA REPAIR Right   . LEFT HEART CATH AND CORONARY ANGIOGRAPHY N/A 07/11/2017   Procedure: LEFT HEART CATH AND CORONARY ANGIOGRAPHY;  Surgeon: Tonny Bollman, MD;  Location: [facility] INVASIVE CV LAB;  Service: Cardiovascular;  Laterality: N/A;  . PARATHYROIDECTOMY    . PERICARDIAL TAP  2000   ?; in [place]/notes 01/18/2011       Home Medications    Prior to Admission medications   Medication Sig Start Date End Date Taking? Authorizing Provider  aspirin 81 MG EC tablet Take 1 tablet (81 mg total) by mouth daily. For heart health 06/26/17   Armandina Stammer I, NP  atorvastatin (LIPITOR) 40 MG tablet Take 1 tablet (40 mg total) by mouth daily. For high Cholesterol 06/26/17   Armandina Stammer I, NP  dicyclomine (BENTYL) 20 MG tablet Take 1 tablet (20 mg total) by mouth 2 (two) times daily. 08/04/17   Petrucelli, Samantha R, PA-C  gabapentin (NEURONTIN) 300 MG capsule Take 1 capsule (300 mg total) by mouth 2 (two) times daily. For agitation 06/26/17   Armandina Stammer I, NP  hydrochlorothiazide (HYDRODIURIL) 25 MG tablet Take 1 tablet (25 mg total) by mouth daily. For high blood pressure 06/26/17   Armandina Stammer I, NP  hydrOXYzine (ATARAX/VISTARIL) 25 MG tablet  Take 1 tablet (25 mg total) by mouth 3 (three) times daily as needed for anxiety. 06/26/17   Armandina Stammer I, NP  insulin aspart protamine- aspart (NOVOLOG MIX 70/30) (70-30) 100 UNIT/ML injection Inject 0.45-0.6 mLs (45-60 Units total) into the skin 2 (two) times daily with a meal. 60 units in the morning and 45 units at dinner time 07/28/17   Mancel Bale, MD  lidocaine (LIDODERM) 5 % Place 1 patch onto the skin daily. Remove & Discard patch within 12 hours or as directed by MD: For pain management Patient not taking: Reported on 07/07/2017 06/27/17   Armandina Stammer I, NP  metoprolol tartrate (LOPRESSOR) 25 MG tablet Take  1 tablet (25 mg total) by mouth 2 (two) times daily. For high blood pressure 06/26/17   Armandina Stammer I, NP  Multiple Vitamin (MULTIVITAMIN WITH MINERALS) TABS tablet Take 1 tablet by mouth daily. For Vitamin supplement 06/26/17   Armandina Stammer I, NP  nicotine (NICODERM CQ - DOSED IN MG/24 HOURS) 14 mg/24hr patch Place 1 patch (14 mg total) onto the skin daily. (May purchase over the counter): For smoking cessation Patient not taking: Reported on 07/07/2017 06/27/17   Armandina Stammer I, NP  nitroGLYCERIN (NITROSTAT) 0.4 MG SL tablet Place 1 tablet (0.4 mg total) every 5 (five) minutes as needed under the tongue for chest pain. Patient not taking: Reported on 07/28/2017 07/11/17   Joseph Art, DO  omeprazole (PRILOSEC) 20 MG capsule Take 1 capsule (20 mg total) by mouth daily. 08/05/17   Fayrene Helper, PA-C  ondansetron (ZOFRAN) 4 MG tablet Take 1 tablet (4 mg total) by mouth every 6 (six) hours. 08/05/17   Fayrene Helper, PA-C  polyethylene glycol (MIRALAX / Ethelene Hal) packet Take 17 g by mouth daily. 08/11/17   Street, Mercedes, PA-C  promethazine (PHENERGAN) 25 MG tablet Take 1 tablet (25 mg total) by mouth every 6 (six) hours as needed for nausea or vomiting. 08/11/17   Street, Mercedes, PA-C  ranitidine (ZANTAC) 150 MG tablet Take 1 tablet (150 mg total) by mouth 2 (two) times daily. 08/11/17   Street, [place], PA-C  risperiDONE (RISPERDAL) 1 MG tablet Take 1 tablet (1 mg) by mouth in the mornings & 2 tablets (2 mg) at bedtime: For mood control Patient taking differently: Take 1-2 mg by mouth 2 (two) times daily. Take 1 tablet (1 mg) by mouth in the mornings & 2 tablets (2 mg) at bedtime: For mood control 06/26/17   Armandina Stammer I, NP  sertraline (ZOLOFT) 50 MG tablet Take 1 tablet (50 mg total) by mouth daily. For depression 06/27/17   Armandina Stammer I, NP  traZODone (DESYREL) 100 MG tablet Take 1 tablet (100 mg total) by mouth at bedtime as needed for sleep. 06/26/17   Sanjuana Kava, NP    Family  History Family History  Problem Relation Age of Onset  . Hypertension Mother   . Diabetes Mother   . Heart disease Father   . Hypertension Sister   . Diabetes Sister   . Hypertension Brother   . Diabetes Brother   . Hypertension Sister   . Diabetes Sister   . Hypertension Brother   . Diabetes Brother     Social History Social History   Tobacco Use  . Smoking status: Current Every Day Smoker    Packs/day: 0.50    Years: 38.00    Pack years: 19.00    Types: Cigarettes  . Smokeless tobacco: Current User    Types: Chew  Substance Use Topics  . Alcohol use: Yes    Comment: 06/12/2017 "stopped ~ 1 yr ago"   . Drug use: Yes    Types: Cocaine    Comment: 06/12/2017 "stopped using cocaine ~ 1 yr ago; used marijuana when I was younger" last cocaine use 9 mths ago per pt on 06/15/17     Allergies   Patient has no known allergies.   Review of Systems Review of Systems  Cardiovascular: Positive for chest pain.  Neurological: Positive for dizziness.  All other systems reviewed and are negative.    Physical Exam Updated Vital Signs BP (!) 163/107   Pulse 88   Temp 97.9 F (36.6 C)   Resp 16   SpO2 100%   Physical Exam  Constitutional: He is oriented to person, place, and time.  Uncomfortable   HENT:  Head: Normocephalic.  Mouth/Throat: Oropharynx is clear and moist.  Eyes: EOM are normal. Pupils are equal, round, and reactive to light.  No obvious nystagmus   Neck: Normal range of motion. Neck supple.  Cardiovascular: Normal rate, regular rhythm and normal heart sounds.  Pulmonary/Chest: Effort normal and breath sounds normal. No stridor. No respiratory distress. He has no wheezes.  Abdominal: Soft. Bowel sounds are normal.  Mild epigastric tenderness   Musculoskeletal: Normal range of motion. He exhibits no edema or tenderness.  Neurological: He is alert and oriented to person, place, and time.  Skin: Skin is warm.  Psychiatric: He has a normal mood and  affect.  Nursing note and vitals reviewed.    ED Treatments / Results  Labs (all labs ordered are listed, but only abnormal results are displayed) Labs Reviewed  COMPREHENSIVE METABOLIC PANEL - Abnormal; Notable for the following components:      Result Value   CO2 18 (*)    Glucose, Bld 317 (*)    Creatinine, Ser 1.30 (*)    GFR calc non Af Amer 60 (*)    All other components within normal limits  LIPASE, BLOOD - Abnormal; Notable for the following components:   Lipase 66 (*)    All other components within normal limits  CBC WITH DIFFERENTIAL/PLATELET  D-DIMER, QUANTITATIVE (NOT AT [facility])  PROTIME-INR  I-STAT TROPONIN, ED  I-STAT TROPONIN, ED    EKG  EKG Interpretation  Date/Time:  Wednesday September 26 2017 15:11:14 EST Ventricular Rate:  90 PR Interval:  160 QRS Duration: 80 QT Interval:  350 QTC Calculation: 429 R Axis:   137 Text Interpretation:  Sinus or ectopic atrial rhythm Probable RVH w/ secondary repol abnormality Anterolateral infarct, age indeterminate ST depression V1-V3, suggest recording posterior leads TWI unchanged since earlier in the day  Confirmed by Richardean Canal ([id number]) on 09/26/2017 3:15:07 PM       Radiology Dg Chest 2 View  Result Date: 09/26/2017 CLINICAL DATA:  Right-sided chest pain. EXAM: CHEST  2 VIEW COMPARISON:  07/09/2017, 08/10/2017 and 06/10/2017 FINDINGS: Heart size and pulmonary vascularity are normal. Lungs are clear. Interstitial accentuation seen at the lung bases on the prior studies is not appreciable on today's exam. IMPRESSION: No active cardiopulmonary disease. Electronically Signed   By: Francene Boyers M.D.   On: 09/26/2017 13:12   Mr Brain Wo Contrast  Result Date: 09/26/2017 CLINICAL DATA:  Dizziness EXAM: MRI HEAD WITHOUT CONTRAST TECHNIQUE: Multiplanar, multiecho pulse sequences of the brain and surrounding structures were obtained without intravenous contrast. COMPARISON:  Brain MRI 12/29/2006 FINDINGS: Brain: The midline  structures are normal. There is no acute  infarct or acute hemorrhage. No mass lesion, hydrocephalus, dural abnormality or extra-axial collection. Unchanged appearance of mild hyperintense T2-weighted signal in the pons. No age-advanced or lobar predominant atrophy. No chronic microhemorrhage or superficial siderosis. Vascular: Major intracranial arterial and venous sinus flow voids are preserved. Skull and upper cervical spine: The visualized skull base, calvarium, upper cervical spine and extracranial soft tissues are normal. Sinuses/Orbits: No fluid levels or advanced mucosal thickening. No mastoid or middle ear effusion. Normal orbits. 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 Electronically Signed   By: Deatra RobinsonKevin  Herman M.D.   On: 09/26/2017 15:09    Procedures Procedures (including critical care time)  CRITICAL CARE Performed by: Richardean Canalavid H Yao   Total critical care time:30 minutes  Critical care time was exclusive of separately billable procedures and treating other patients.  Critical care was necessary to treat or prevent imminent or life-threatening deterioration.  Critical care was time spent personally by me on the following activities: development of treatment plan with patient and/or surrogate as well as nursing, discussions with consultants, evaluation of patient's response to treatment, examination of patient, obtaining history from patient or surrogate, ordering and performing treatments and interventions, ordering and review of laboratory studies, ordering and review of radiographic studies, pulse oximetry and re-evaluation of patient's condition.   Medications Ordered in ED Medications  nitroGLYCERIN (NITROSTAT) SL tablet 0.4 mg (not administered)  insulin aspart (novoLOG) injection 5 Units (not administered)  sodium chloride 0.9 % bolus 1,000 mL (0 mLs Intravenous Stopped 09/26/17 1448)  aspirin tablet 325 mg (325  mg Oral Given 09/26/17 1200)  meclizine (ANTIVERT) tablet 25 mg (25 mg Oral Given 09/26/17 1200)     Initial Impression / Assessment and Plan / ED Course  I have reviewed the triage vital signs and the nursing notes.  Pertinent labs & imaging results that were available during my care of the patient were reviewed by me and considered in my medical decision making (see chart for details).    Charles Orr is a 57 y.o. male here with chest pain, dizziness. Has chest pain for several weeks now 10/10. He has previous nonobstructive CAD and hx of DM, HTN. I am concerned for possible STEMI but had previous J point elevation before. I called Dr. Sw[place]il[place]Jordan, STEMI doctor, who recommend activate STEMI.  Code STEMI activated at 11:17 am. Also consider posterior circulation stroke vs PE. I ordered ASA, nitro, heparin, labs, d-dimer, CT head, CXR.   11:27 am Dr. Sw[place]Jordan here to see patient. He told Dr. Sw[place]Jordan that pain has been going on for several weeks. He actually had cath several months ago that showed non obstructive disease. He canceled code STEMI. Will proceed with original workup with labs, d-dimer, CT head.   3:27 PM D-dimer neg. Delta trop neg. TWI in inferior leads and V2, V3, not clear if it is lead placement or not. No dizziness currently, no chest pain currently. MRI negative for stroke. He tells me that he hasn't been taking his insulin or BP meds for several months. Will refill BP meds, novolog. Will dc home with meclizine as well. Will have him follow up with cardiology (he hasn't seen them since discharge several months ago). Dizziness likely multifactorial- BPPV, uncontrolled hypertension and diabetes. Stable for discharge.    Final Clinical Impressions(s) / ED Diagnoses   Final diagnoses:  None    ED Discharge Orders    None       Silverio LayYao,  Gonzella Lex, MD 09/26/17 [phone]

## 2017-09-26 NOTE — ED Notes (Signed)
PAGED STEMI ON CALL MD, PER VERBAL REQUEST FROM DR.Silverio LayYAO

## 2017-09-26 NOTE — ED Triage Notes (Signed)
[facility], pt c/o onset of dizziness worse with movement, pt hx of vertigo and reports being out of his medicine, pt denies facial droop, no slurred speech, pt reports being out of insulin and HTN meds, pt denies SOB, CP, n/v/d, A&O x4

## 2017-09-26 NOTE — ED Notes (Signed)
ACTIVATED CODE STEMI WITH DOUG CARELINK  PER  DR.Silverio LayYAO

## 2017-10-05 ENCOUNTER — Encounter (HOSPITAL_COMMUNITY): Payer: Self-pay

## 2017-10-05 ENCOUNTER — Emergency Department (HOSPITAL_COMMUNITY)
Admission: EM | Admit: 2017-10-05 | Discharge: 2017-10-06 | Disposition: A | Discharge status: Home / Self Care | Payer: Medicaid Other | Attending: Emergency Medicine | Admitting: Emergency Medicine

## 2017-10-05 DIAGNOSIS — Z794 Long term (current) use of insulin: Secondary | ICD-10-CM | POA: Insufficient documentation

## 2017-10-05 DIAGNOSIS — Z79899 Other long term (current) drug therapy: Secondary | ICD-10-CM | POA: Diagnosis not present

## 2017-10-05 DIAGNOSIS — I209 Angina pectoris, unspecified: Secondary | ICD-10-CM | POA: Diagnosis not present

## 2017-10-05 DIAGNOSIS — I10 Essential (primary) hypertension: Secondary | ICD-10-CM | POA: Insufficient documentation

## 2017-10-05 DIAGNOSIS — F17210 Nicotine dependence, cigarettes, uncomplicated: Secondary | ICD-10-CM | POA: Diagnosis not present

## 2017-10-05 DIAGNOSIS — E119 Type 2 diabetes mellitus without complications: Secondary | ICD-10-CM | POA: Diagnosis not present

## 2017-10-05 DIAGNOSIS — F14151 Cocaine abuse with cocaine-induced psychotic disorder with hallucinations: Secondary | ICD-10-CM | POA: Diagnosis present

## 2017-10-05 DIAGNOSIS — F11288 Opioid dependence with other opioid-induced disorder: Secondary | ICD-10-CM | POA: Diagnosis not present

## 2017-10-05 DIAGNOSIS — R45851 Suicidal ideations: Secondary | ICD-10-CM | POA: Diagnosis not present

## 2017-10-05 DIAGNOSIS — F1920 Other psychoactive substance dependence, uncomplicated: Secondary | ICD-10-CM

## 2017-10-05 DIAGNOSIS — R112 Nausea with vomiting, unspecified: Secondary | ICD-10-CM | POA: Insufficient documentation

## 2017-10-05 DIAGNOSIS — R1111 Vomiting without nausea: Secondary | ICD-10-CM

## 2017-10-05 DIAGNOSIS — F329 Major depressive disorder, single episode, unspecified: Secondary | ICD-10-CM | POA: Diagnosis present

## 2017-10-05 DIAGNOSIS — Z7982 Long term (current) use of aspirin: Secondary | ICD-10-CM | POA: Diagnosis not present

## 2017-10-05 NOTE — ED Notes (Signed)
Pt states that he's tired and feels worthless He's suppose to be on medication but doesn't have any

## 2017-10-05 NOTE — ED Notes (Signed)
Pt here voluntarily with [employer]

## 2017-10-05 NOTE — ED Notes (Signed)
Bed: WTR6 Expected date:  Expected time:  Means of arrival:  Comments: 

## 2017-10-05 NOTE — ED Provider Notes (Signed)
[facility]-EMERGENCY DEPT Provider Note   CSN: [id number] Arrival date & time: 10/05/17  2254     History   Chief Complaint Chief Complaint  Patient presents with  . Suicidal  . Abdominal Pain  . Emesis    HPI Charles Orr is a 57 y.o. male with a hx of HTN, MI, anxiety, chronic low back pain, schizophrenia, IDDM presents to the Emergency Department complaining of gradual, persistent, progressively worsening feeling of depression and SI onset 2 weeks ago.  Pt reports he feels serious, but did not make a specific plan. He states that he is prescribed medications for his depression, but he ran out of them recently.  He cannot remember exactly when.  Pt reports he has his other medication.  Associated symptoms include vomiting and generalized abd pain onset yesterday.  Pt reports emesis is NBNB but he does not know how many times.  Pt denies fevers.  No aggravating or alleviating factors.  Pt denies fever, chest pain, SOB, diarrhea, weakness, dizziness, syncope, dysuria.   Pt reports drinking EtOH today and use of crack cocaine.  He does smoke approx 1/2 pack per day of cigarettes per day.   The history is provided by the patient and medical records. No language interpreter was used.    Past Medical History:  Diagnosis Date  . Anxiety   . Arthritis    "back" (06/12/2017)  . Bipolar disorder (HCC)   . CAP (community acquired pneumonia) 06/10/2017  . Chronic lower back pain   . Degenerative disc disease, lumbar   . Depression   . Fibromyalgia   . GERD (gastroesophageal reflux disease)   . High cholesterol   . Hypertension   . Myocardial infarction (HCC) 2002  . Pneumonia 1989  . Schizophrenia (HCC)   . Type II diabetes mellitus [facility])     Patient Active Problem List   Diagnosis Date Noted  . Chest pain 07/10/2017  . Angina pectoris (HCC) 07/09/2017  . Tobacco abuse 07/09/2017  . Hypertension 07/09/2017  . Type II diabetes mellitus (HCC)   . GERD  (gastroesophageal reflux disease)   . Chronic lower back pain   . Poorly controlled diabetes mellitus (HCC)   . Hyperlipidemia with target LDL less than 70   . Schizoaffective disorder, bipolar type (HCC) 06/16/2017  . Depression 06/15/2017  . Other schizophrenia (HCC)   . Pneumonia 06/11/2017  . Community acquired pneumonia of right lower lobe of lung (HCC)   . Hyperglycemia   . Localized edema   . Shortness of breath 06/10/2017    Past Surgical History:  Procedure Laterality Date  . CARDIAC CATHETERIZATION  12/2006   [name] 01/18/2011  . INGUINAL HERNIA REPAIR Right   . LEFT HEART CATH AND CORONARY ANGIOGRAPHY N/A 07/11/2017   Procedure: LEFT HEART CATH AND CORONARY ANGIOGRAPHY;  Surgeon: Tonny Bollman, MD;  Location: [facility] INVASIVE CV LAB;  Service: Cardiovascular;  Laterality: N/A;  . PARATHYROIDECTOMY    . PERICARDIAL TAP  2000   ?; in [place]/notes 01/18/2011       Home Medications    Prior to Admission medications   Medication Sig Start Date End Date Taking? Authorizing Provider  aspirin 81 MG EC tablet Take 1 tablet (81 mg total) by mouth daily. For heart health 06/26/17  Yes Armandina Stammer I, NP  atorvastatin (LIPITOR) 40 MG tablet Take 1 tablet (40 mg total) by mouth daily. For high Cholesterol 06/26/17  Yes Armandina Stammer I, NP  dicyclomine (BENTYL) 20 MG  tablet Take 1 tablet (20 mg total) by mouth 2 (two) times daily. 08/04/17  Yes Petrucelli, Samantha R, PA-C  gabapentin (NEURONTIN) 300 MG capsule Take 1 capsule (300 mg total) by mouth 2 (two) times daily. For agitation 06/26/17  Yes Nwoko, Nicole Kindred I, NP  hydrochlorothiazide (HYDRODIURIL) 25 MG tablet Take 1 tablet (25 mg total) by mouth daily. For high blood pressure 09/26/17  Yes Charlynne Pander, MD  hydrOXYzine (ATARAX/VISTARIL) 25 MG tablet Take 1 tablet (25 mg total) by mouth 3 (three) times daily as needed for anxiety. 06/26/17  Yes Armandina Stammer I, NP  insulin aspart protamine- aspart (NOVOLOG MIX 70/30) (70-30) 100  UNIT/ML injection Inject 0.45-0.6 mLs (45-60 Units total) into the skin 2 (two) times daily with a meal. 60 units in the morning and 45 units at dinner time 09/26/17  Yes Charlynne Pander, MD  meclizine (ANTIVERT) 25 MG tablet Take 1 tablet (25 mg total) by mouth 3 (three) times daily as needed for dizziness. 09/26/17  Yes Charlynne Pander, MD  metoprolol tartrate (LOPRESSOR) 25 MG tablet Take 1 tablet (25 mg total) by mouth 2 (two) times daily. For high blood pressure 09/26/17  Yes Charlynne Pander, MD  Multiple Vitamin (MULTIVITAMIN WITH MINERALS) TABS tablet Take 1 tablet by mouth daily. For Vitamin supplement 06/26/17  Yes Nwoko, Nicole Kindred I, NP  nitroGLYCERIN (NITROSTAT) 0.4 MG SL tablet Place 1 tablet (0.4 mg total) every 5 (five) minutes as needed under the tongue for chest pain. 07/11/17  Yes Vann, Jessica U, DO  ranitidine (ZANTAC) 150 MG tablet Take 1 tablet (150 mg total) by mouth 2 (two) times daily. 08/11/17  Yes Street, [place], PA-C  risperiDONE (RISPERDAL) 1 MG tablet Take 1 tablet (1 mg) by mouth in the mornings & 2 tablets (2 mg) at bedtime: For mood control Patient taking differently: Take 1 mg by mouth 2 (two) times daily.  06/26/17  Yes Armandina Stammer I, NP  sertraline (ZOLOFT) 50 MG tablet Take 1 tablet (50 mg total) by mouth daily. For depression 06/27/17  Yes Nwoko, Agnes I, NP  lidocaine (LIDODERM) 5 % Place 1 patch onto the skin daily. Remove & Discard patch within 12 hours or as directed by MD: For pain management Patient not taking: Reported on 07/07/2017 06/27/17   Armandina Stammer I, NP  nicotine (NICODERM CQ - DOSED IN MG/24 HOURS) 14 mg/24hr patch Place 1 patch (14 mg total) onto the skin daily. (May purchase over the counter): For smoking cessation Patient not taking: Reported on 07/07/2017 06/27/17   Armandina Stammer I, NP  omeprazole (PRILOSEC) 20 MG capsule Take 1 capsule (20 mg total) by mouth daily. Patient not taking: Reported on 09/26/2017 08/05/17   Fayrene Helper, PA-C    ondansetron (ZOFRAN) 4 MG tablet Take 1 tablet (4 mg total) by mouth every 6 (six) hours. Patient not taking: Reported on 09/26/2017 08/05/17   Fayrene Helper, PA-C  polyethylene glycol [facility] / Ethelene Hal) packet Take 17 g by mouth daily. Patient not taking: Reported on 09/26/2017 08/11/17   Street, [place], PA-C  promethazine (PHENERGAN) 25 MG tablet Take 1 tablet (25 mg total) by mouth every 6 (six) hours as needed for nausea or vomiting. Patient not taking: Reported on 10/05/2017 08/11/17   Street, [place], PA-C  traZODone (DESYREL) 100 MG tablet Take 1 tablet (100 mg total) by mouth at bedtime as needed for sleep. Patient not taking: Reported on 09/26/2017 06/26/17   Sanjuana Kava, NP    Family History Family  History  Problem Relation Age of Onset  . Hypertension Mother   . Diabetes Mother   . Heart disease Father   . Hypertension Sister   . Diabetes Sister   . Hypertension Brother   . Diabetes Brother   . Hypertension Sister   . Diabetes Sister   . Hypertension Brother   . Diabetes Brother     Social History Social History   Tobacco Use  . Smoking status: Current Every Day Smoker    Packs/day: 0.50    Years: 38.00    Pack years: 19.00    Types: Cigarettes  . Smokeless tobacco: Current User    Types: Chew  Substance Use Topics  . Alcohol use: Yes    Comment: 06/12/2017 "stopped ~ 1 yr ago"   . Drug use: Yes    Types: Cocaine    Comment: 06/12/2017 "stopped using cocaine ~ 1 yr ago; used marijuana when I was younger" last cocaine use 9 mths ago per pt on 06/15/17     Allergies   Patient has no known allergies.   Review of Systems Review of Systems  Constitutional: Negative for appetite change, diaphoresis, fatigue, fever and unexpected weight change.  HENT: Negative for mouth sores.   Eyes: Negative for visual disturbance.  Respiratory: Negative for cough, chest tightness, shortness of breath and wheezing.   Cardiovascular: Negative for chest pain.   Gastrointestinal: Positive for abdominal pain, nausea and vomiting. Negative for constipation and diarrhea.  Endocrine: Negative for polydipsia, polyphagia and polyuria.  Genitourinary: Negative for dysuria, frequency, hematuria and urgency.  Musculoskeletal: Negative for back pain and neck stiffness.  Skin: Negative for rash.  Allergic/Immunologic: Negative for immunocompromised state.  Neurological: Negative for syncope, light-headedness and headaches.  Hematological: Does not bruise/bleed easily.  Psychiatric/Behavioral: Positive for dysphoric mood and suicidal ideas. Negative for sleep disturbance. The patient is not nervous/anxious.      Physical Exam Updated Vital Signs BP 120/78 (BP Location: Left Arm)   Pulse 98   Temp 98.7 F (37.1 C) (Oral)   Resp 18   SpO2 95%   Physical Exam  Constitutional: He appears well-developed and well-nourished. No distress.  Awake, alert, nontoxic appearance  HENT:  Head: Normocephalic and atraumatic.  Mouth/Throat: Oropharynx is clear and moist. No oropharyngeal exudate.  Eyes: Conjunctivae are normal. No scleral icterus.  Neck: Normal range of motion. Neck supple.  Cardiovascular: Normal rate, regular rhythm and intact distal pulses.  Pulmonary/Chest: Effort normal and breath sounds normal. No respiratory distress. He has no wheezes.  Equal chest expansion  Abdominal: Soft. Bowel sounds are normal. He exhibits no mass. There is generalized tenderness (very mild). There is no rigidity, no rebound, no guarding, no CVA tenderness, no tenderness at McBurney's point and negative Murphy's sign.  Musculoskeletal: Normal range of motion. He exhibits no edema.  Neurological: He is alert.  Speech is clear and goal oriented Moves extremities without ataxia  Skin: Skin is warm and dry. He is not diaphoretic.  Psychiatric: He has a normal mood and affect. He is agitated. He is not actively hallucinating. He expresses suicidal ideation. He expresses  no homicidal ideation. He expresses no suicidal plans and no homicidal plans.  Nursing note and vitals reviewed.    ED Treatments / Results  Labs (all labs ordered are listed, but only abnormal results are displayed) Labs Reviewed  COMPREHENSIVE METABOLIC PANEL - Abnormal; Notable for the following components:      Result Value   Sodium 133 (*)  CO2 20 (*)    Glucose, Bld 203 (*)    All other components within normal limits  RAPID URINE DRUG SCREEN, HOSP PERFORMED - Abnormal; Notable for the following components:   Opiates POSITIVE (*)    Cocaine POSITIVE (*)    Tetrahydrocannabinol POSITIVE (*)    All other components within normal limits  URINALYSIS, ROUTINE W REFLEX MICROSCOPIC - Abnormal; Notable for the following components:   Glucose, UA >=500 (*)    Hgb urine dipstick SMALL (*)    Ketones, ur 5 (*)    Protein, ur 30 (*)    Squamous Epithelial / LPF 0-5 (*)    All other components within normal limits  CBC - Abnormal; Notable for the following components:   WBC 11.0 (*)    All other components within normal limits  LIPASE, BLOOD  ETHANOL  SALICYLATE LEVEL  ACETAMINOPHEN LEVEL  I-STAT TROPONIN, ED    EKG  EKG Interpretation  Date/Time:  Saturday October 06 2017 04:01:28 EST Ventricular Rate:  94 PR Interval:    QRS Duration: 91 QT Interval:  355 QTC Calculation: 444 R Axis:   74 Text Interpretation:  Sinus rhythm Probable left atrial enlargement Borderline T abnormalities, inferior leads Confirmed by Nicanor Alcon, April ([id number]) on 10/06/2017 5:48:45 AM         Procedures Procedures (including critical care time)  Medications Ordered in ED Medications  LORazepam (ATIVAN) injection 0-4 mg (not administered)    Or  LORazepam (ATIVAN) tablet 0-4 mg (not administered)  LORazepam (ATIVAN) injection 0-4 mg (not administered)    Or  LORazepam (ATIVAN) tablet 0-4 mg (not administered)  thiamine (VITAMIN B-1) tablet 100 mg (not administered)    Or  thiamine  (B-1) injection 100 mg (not administered)  ibuprofen (ADVIL,MOTRIN) tablet 600 mg (not administered)  ondansetron (ZOFRAN) tablet 4 mg (not administered)  alum & mag hydroxide-simeth (MAALOX/MYLANTA) 200-200-20 MG/5ML suspension 30 mL (not administered)  nicotine (NICODERM CQ - dosed in mg/24 hours) patch 21 mg (not administered)  sodium chloride 0.9 % bolus 1,000 mL (0 mLs Intravenous Stopped 10/06/17 0522)  pantoprazole (PROTONIX) EC tablet 40 mg (40 mg Oral Given 10/06/17 0035)     Initial Impression / Assessment and Plan / ED Course  I have reviewed the triage vital signs and the nursing notes.  Pertinent labs & imaging results that were available during my care of the patient were reviewed by me and considered in my medical decision making (see chart for details).  Clinical Course as of Oct 06 626  Sat Oct 06, 2017  0628 Pt needs AM psyc eval, per TTS.  [HM]    Clinical Course User Index [HM] Muthersbaugh, Dahlia Client, PA-C    Patient presents with suicidal ideation.    He additionally reports abdominal pain.  Mild leukocytosis.  Abdomen is soft without rebound or guarding on my exam.  CMP with mild hyponatremia and CO2 is low.  Fluid bolus given.  Patient has tolerated p.o. without vomiting.  Urinalysis shows a glucose and protein but no evidence of urinary tract infection.  Drug screen is positive for opiates cocaine and THC.  Patient did admit to cocaine tonight.  EKG is nonischemic.  Patient is without chest pain.  Normal lipase, normal LFTs and normal troponin.  Patient is medically cleared for TTS evaluation.  Final Clinical Impressions(s) / ED Diagnoses   Final diagnoses:  Non-intractable vomiting without nausea, unspecified vomiting type  Suicidal ideations    ED Discharge Orders    None  Muthersbaugh, Boyd KerbsHannah, PA-C 10/06/17 0551    Muthersbaugh, Dahlia ClientHannah, PA-C 10/06/17 [phone]

## 2017-10-06 DIAGNOSIS — F14151 Cocaine abuse with cocaine-induced psychotic disorder with hallucinations: Secondary | ICD-10-CM

## 2017-10-06 DIAGNOSIS — F1920 Other psychoactive substance dependence, uncomplicated: Secondary | ICD-10-CM

## 2017-10-06 HISTORY — DX: Other psychoactive substance dependence, uncomplicated: F19.20

## 2017-10-06 HISTORY — DX: Cocaine abuse with cocaine-induced psychotic disorder with hallucinations: F14.151

## 2017-10-06 LAB — CBC
HCT: 44 % (ref 39.0–52.0)
Hemoglobin: 15.2 g/dL (ref 13.0–17.0)
MCH: 27.5 pg (ref 26.0–34.0)
MCHC: 34.5 g/dL (ref 30.0–36.0)
MCV: 79.7 fL (ref 78.0–100.0)
PLATELETS: 257 10*3/uL (ref 150–400)
RBC: 5.52 MIL/uL (ref 4.22–5.81)
RDW: 14.6 % (ref 11.5–15.5)
WBC: 11 10*3/uL — AB (ref 4.0–10.5)

## 2017-10-06 LAB — I-STAT TROPONIN, ED: TROPONIN I, POC: 0.01 ng/mL (ref 0.00–0.08)

## 2017-10-06 LAB — LIPASE, BLOOD: LIPASE: 46 U/L (ref 11–51)

## 2017-10-06 LAB — URINALYSIS, ROUTINE W REFLEX MICROSCOPIC
BILIRUBIN URINE: NEGATIVE
Bacteria, UA: NONE SEEN
Glucose, UA: >=500 mg/dL — AB
Ketones, ur: 5 mg/dL — AB
Leukocytes, UA: NEGATIVE
Nitrite: NEGATIVE
Protein, ur: 30 mg/dL — AB
SPECIFIC GRAVITY, URINE: 1.022 (ref 1.005–1.030)
pH: 5 (ref 5.0–8.0)

## 2017-10-06 LAB — COMPREHENSIVE METABOLIC PANEL
ALBUMIN: 4.3 g/dL (ref 3.5–5.0)
ALT: 30 U/L (ref 17–63)
ANION GAP: 12 (ref 5–15)
AST: 27 U/L (ref 15–41)
Alkaline Phosphatase: 76 U/L (ref 38–126)
BUN: 13 mg/dL (ref 6–20)
CHLORIDE: 101 mmol/L (ref 101–111)
CO2: 20 mmol/L — AB (ref 22–32)
Calcium: 9.4 mg/dL (ref 8.9–10.3)
Creatinine, Ser: 1.12 mg/dL (ref 0.61–1.24)
GFR calc non Af Amer: >60 mL/min (ref >60)
GLUCOSE: 203 mg/dL — AB (ref 65–99)
Potassium: 4.1 mmol/L (ref 3.5–5.1)
SODIUM: 133 mmol/L — AB (ref 135–145)
Total Bilirubin: 1 mg/dL (ref 0.3–1.2)
Total Protein: 7.7 g/dL (ref 6.5–8.1)

## 2017-10-06 LAB — RAPID URINE DRUG SCREEN, HOSP PERFORMED
Amphetamines: NOT DETECTED
BENZODIAZEPINES: NOT DETECTED
Barbiturates: NOT DETECTED
COCAINE: POSITIVE — AB
Opiates: POSITIVE — AB
TETRAHYDROCANNABINOL: POSITIVE — AB

## 2017-10-06 LAB — CBG MONITORING, ED: GLUCOSE-CAPILLARY: 138 mg/dL — AB (ref 65–99)

## 2017-10-06 MED ORDER — SODIUM CHLORIDE 0.9 % IV BOLUS (SEPSIS)
1000.0000 mL | Freq: Once | INTRAVENOUS | Status: AC
  Administered 2017-10-06: 1000 mL via INTRAVENOUS

## 2017-10-06 MED ORDER — GABAPENTIN 300 MG PO CAPS
300.0000 mg | ORAL_CAPSULE | Freq: Two times a day (BID) | ORAL | Status: DC
  Administered 2017-10-06: 300 mg via ORAL
  Filled 2017-10-06: qty 1

## 2017-10-06 MED ORDER — NICOTINE 21 MG/24HR TD PT24: 21.0000 mg | MEDICATED_PATCH | Freq: Every day | TRANSDERMAL | Status: DC

## 2017-10-06 MED ORDER — LORAZEPAM 2 MG/ML IJ SOLN
0.0000 mg | Freq: Four times a day (QID) | INTRAMUSCULAR | Status: DC
  Administered 2017-10-06: 1 mg via INTRAVENOUS
  Filled 2017-10-06: qty 1

## 2017-10-06 MED ORDER — PANTOPRAZOLE SODIUM 40 MG PO TBEC
40.0000 mg | DELAYED_RELEASE_TABLET | Freq: Once | ORAL | Status: AC
  Administered 2017-10-06: 40 mg via ORAL
  Filled 2017-10-06: qty 1

## 2017-10-06 MED ORDER — THIAMINE HCL 100 MG/ML IJ SOLN: 100.0000 mg | Freq: Every day | INTRAMUSCULAR | Status: DC

## 2017-10-06 MED ORDER — TRAZODONE HCL 100 MG PO TABS: 100.0000 mg | ORAL_TABLET | Freq: Every evening | ORAL | Status: DC | PRN

## 2017-10-06 MED ORDER — LORAZEPAM 1 MG PO TABS: 0.0000 mg | ORAL_TABLET | Freq: Four times a day (QID) | ORAL | Status: DC

## 2017-10-06 MED ORDER — VITAMIN B-1 100 MG PO TABS
100.0000 mg | ORAL_TABLET | Freq: Every day | ORAL | Status: DC
  Administered 2017-10-06: 100 mg via ORAL
  Filled 2017-10-06: qty 1

## 2017-10-06 MED ORDER — ONDANSETRON HCL 4 MG PO TABS
4.0000 mg | ORAL_TABLET | Freq: Three times a day (TID) | ORAL | Status: DC | PRN
Start: 2017-10-06 — End: 2017-10-06

## 2017-10-06 MED ORDER — ALUM & MAG HYDROXIDE-SIMETH 200-200-20 MG/5ML PO SUSP: 30.0000 mL | Freq: Four times a day (QID) | ORAL | Status: DC | PRN

## 2017-10-06 MED ORDER — IBUPROFEN 200 MG PO TABS: 600.0000 mg | ORAL_TABLET | Freq: Three times a day (TID) | ORAL | Status: DC | PRN

## 2017-10-06 MED ORDER — LORAZEPAM 1 MG PO TABS: 0.0000 mg | ORAL_TABLET | Freq: Two times a day (BID) | ORAL | Status: DC

## 2017-10-06 MED ORDER — LORAZEPAM 2 MG/ML IJ SOLN: 0.0000 mg | Freq: Two times a day (BID) | INTRAMUSCULAR | Status: DC

## 2017-10-06 NOTE — ED Notes (Signed)
On admission to the Acute Unit pt is so sleepy that he is not able to answer questions. Initially refused CBG because of sleepiness. This writer was able to obtain CBG.
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
## 2017-10-06 NOTE — ED Notes (Signed)
Pt unable to tolerate PO challenge. He said, "my stomach hurts too much."

## 2017-10-06 NOTE — ED Notes (Signed)
EKG exported to Epic.

## 2017-10-06 NOTE — ED Notes (Signed)
Patient refused CBG. Charles Orr/NCPT1

## 2017-10-06 NOTE — Discharge Instructions (Signed)
Follow up at:  [facility] Phone: [phone] Physical Address: [address] [id number]  Mon-Fri 8:00 to 5:00  Walk-ins accepted

## 2017-10-06 NOTE — ED Notes (Signed)
Pt was seen by Peer Support and given follow-up information in addiction to his discharge instructions. He was reluctant to leave and seemed like he did not understand the discharge instructions, so John with Peer Support spoke with him again. All belongings were returned to pt. Pt would not sign for his belongings or sign out.

## 2017-10-06 NOTE — BH Assessment (Signed)
BHH Assessment Progress Note   Per Nira ConnJason Berry, NP pt is recommended for continued observation and to be [facility] by [facility]ry [facility] with a plan and passive thoughts to harm others. EDP Dr. Nicanor AlconPalumbo, MD notified of the disposition. TTS attempted to inform the pt's nurse but was placed on hold for more than 10 minutes and the nurse did not come to the line.   Princess BruinsAquicha Johnross Nabozny, MSW, LCSW Therapeutic Triage Specialist  [phone]

## 2017-10-06 NOTE — ED Notes (Signed)
IV access attempted twice, unsuccessful 

## 2017-10-06 NOTE — Patient Outreach (Signed)
Patient currently does not meet criteria for detox at [facility]. [facility]PSS provided multiple resources for other detox centers in the area. [facility]PSS recommended the patient to take the bus to [facility] for detox. Patient does not have family/friend supports to take him to a detox center. CPSS encouraged the patient to contact [facility]PSS after discharge for any help with substance use recovery resources.

## 2017-10-06 NOTE — Patient Outreach (Signed)
ED Peer Support Specialist Patient Intake (Complete at intake & 30-60 Day Follow-up)  Name: Charles Orr  MRN: [id number]  Age: 57 y.o.   Date of Admission: 10/06/2017  Intake: Initial Comments:      Primary Reason Admitted: SI, depression, poly substance use with heroin, cocaine, and marijuana  Lab values: Alcohol/ETOH: Not completed Positive UDS? Yes Amphetamines: No Barbiturates: No Benzodiazepines: No Cocaine: Yes Opiates: Yes Cannabinoids: Yes  Demographic information: Gender: Male Ethnicity: African American Marital Status: Divorced Insurance Status: Patent attorneyMedicaid Receives non-medical governmental assistance (Work Engineer, agriculturalirst/Welfare, Sales executivefood stamps, etc.: Yes(Disability) Lives with:   Living situation:    Reported Patient History: Patient reported health conditions: Bipolar disorder, Schizophrenia, Depression, Diabetes(anxiety) Patient aware of HIV and hepatitis status: No  In past year, has patient visited ED for any reason? Yes  Number of ED visits:    Reason(s) for visit: multiple times in the past year does not remember how much for substance use  In past year, has patient been hospitalized for any reason? No  Number of hospitalizations:    Reason(s) for hospitalization:    In past year, has patient been arrested? No  Number of arrests:    Reason(s) for arrest:    In past year, has patient been incarcerated? No  Number of incarcerations:    Reason(s) for incarceration:    In past year, has patient received medication-assisted treatment? No  In past year, patient received the following treatments: Residential treatment (non-hospital)([place] )  In past year, has patient received any harm reduction services? No  Did this include any of the following?    In past year, has patient received care from a mental health provider for diagnosis other than SUD? No  In past year, is this first time patient has overdosed? (has not overdosed)  Number of past overdoses:     In past year, is this first time patient has been hospitalized for an overdose? (has not overdosed)  Number of hospitalizations for overdose(s):    Is patient currently receiving treatment for a mental health diagnosis? No  Patient reports experiencing difficulty participating in SUD treatment: No    Most important reason(s) for this difficulty?    Has patient received prior services for treatment? Yes(Substance use treatment in [place]. Does not rember the name of the treatment center.)  In past, patient has received services from following agencies:    Plan of Care:  Suggested follow up at these agencies/treatment centers: (Patient is seeking inpatient for heroin. [facility] will make referrals for [facility] since he has medicaid. [facility] will also provide information for [facility]. )  Other information: CPSS will provide [facility] contact information. CPSS encouraged the patient to contact [facility]PSS at anytime for substance use recovery support.   Bartholomew BoardsJohn Michon Kaczmarek, CPSS  10/06/2017 11:48 AM

## 2017-10-06 NOTE — BHH Suicide Risk Assessment (Signed)
Suicide Risk Assessment  Discharge Assessment   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 Polysubstance (including opioids) dependence with physiological dependence (HCC) [F19.20] 10/06/2017  . 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 Chest pain [R07.9] 07/10/2017  . Angina pectoris (HCC) [I20.9] 07/09/2017  . Tobacco abuse [Z72.0] 07/09/2017  . Hypertension [I10] 07/09/2017  . Type II diabetes mellitus (HCC) [E11.9]   . GERD (gastroesophageal reflux disease) [K21.9]   . Chronic lower back pain [M54.5, G89.29]   . Poorly controlled diabetes mellitus (HCC) [E11.65]   . Hyperlipidemia with target LDL less than 70 [E78.5]   . Schizoaffective disorder, bipolar type (HCC) [F25.0] 06/16/2017  . Depression [F32.9] 06/15/2017  . Other schizophrenia (HCC) [F20.89]   . Pneumonia [J18.9] 06/11/2017  . Community acquired pneumonia of right lower lobe of lung (HCC) [J18.1]   . Hyperglycemia [R73.9]   . Localized edema [R60.0]   . Shortness of breath [R06.02] 06/10/2017   Pt was seen and chart reviewed with treatment team and Dr Jannifer FranklinAkintayo. Pt's UDS positive for opiates, THC, cocaine, BAL negative. Pt stated he has been using drugs for 20 years, last rehab [place] "not too long ago" [place]. Pt is well known to this hospital system and has had 10 ED visits and 3 inpatient admissions in the past 6 months. Pt will be seen by Peer Support for assistance in the community for substance abuse treatment programs. Pt is interested in treatment for his drug use. Pt is psychiatrically clear for discharge.   Total Time spent with patient: 45 minutes  Musculoskeletal: Strength & Muscle Tone: within normal limits Gait & Station: normal Patient leans:  N/A  Psychiatric Specialty Exam:   Blood pressure 126/72, pulse 99, temperature 98.7 F (37.1 C), temperature source Oral, resp. rate 18, SpO2 100 %.There is no height or weight on file to calculate BMI.  General Appearance: Casual  Eye Contact::  Fair  Speech:  Clear and Coherent409  Volume:  Decreased  Mood:  Anxious and Depressed  Affect:  Congruent and Depressed  Thought Process:  Coherent, Goal Directed and Linear  Orientation:  Full (Time, Place, and Person)  Thought Content:  Logical  Suicidal Thoughts:  No  Homicidal Thoughts:  No  Memory:  Immediate;   Good Recent;   Good Remote;   Good  Judgement:  Poor  Insight:  Lacking  Psychomotor Activity:  Normal  Concentration:  Good  Recall:  Good  Fund of Knowledge:Good  Language: Good  Akathisia:  No  Handed:  Right  AIMS (if indicated):     Assets:  ArchitectCommunication Skills Financial Resources/Insurance Housing  Sleep:     Cognition: WNL  ADL's:  Intact   Mental Status Per Nursing Assessment::   On Admission:     Demographic Factors:  Male, Low socioeconomic status, Living alone and Unemployed  Loss Factors: Financial problems/change in socioeconomic status  Historical Factors: Impulsivity  Risk Reduction Factors:   Sense of responsibility to family  Continued Clinical Symptoms:  Depression:   Impulsivity Alcohol/Substance Abuse/Dependencies  Cognitive Features That Contribute To Risk:  Closed-mindedness    Suicide Risk:  Minimal: No identifiable suicidal ideation.  Patients presenting with no risk factors but with morbid ruminations; may be classified as minimal risk based on the severity of the depressive symptoms  Plan Of Care/Follow-up recommendations:  Activity:  as tolerated  Diet:  Heart Healthy  Laveda Abbe, NP 10/06/2017, 11:05 AM

## 2017-10-06 NOTE — ED Notes (Signed)
Pt has glasses and a watch. Removed and placed with pt belongings.

## 2017-10-06 NOTE — ED Notes (Signed)
Pt has 4 belonging bags placed in the cabinet behind nursing station.

## 2017-10-06 NOTE — ED Notes (Signed)
Bed: [id number] Expected date:  Expected time:  Means of arrival:  Comments: Hold for 23 

## 2017-10-07 ENCOUNTER — Encounter (HOSPITAL_COMMUNITY): Payer: Self-pay

## 2017-10-07 ENCOUNTER — Emergency Department (HOSPITAL_COMMUNITY)
Admission: EM | Admit: 2017-10-07 | Discharge: 2017-10-08 | Disposition: A | Discharge status: Other Acute Inpatient Hospital | Payer: Medicaid Other | Attending: Emergency Medicine | Admitting: Emergency Medicine

## 2017-10-07 DIAGNOSIS — Z79899 Other long term (current) drug therapy: Secondary | ICD-10-CM | POA: Diagnosis not present

## 2017-10-07 DIAGNOSIS — E119 Type 2 diabetes mellitus without complications: Secondary | ICD-10-CM | POA: Diagnosis not present

## 2017-10-07 DIAGNOSIS — F17210 Nicotine dependence, cigarettes, uncomplicated: Secondary | ICD-10-CM | POA: Diagnosis not present

## 2017-10-07 DIAGNOSIS — F329 Major depressive disorder, single episode, unspecified: Secondary | ICD-10-CM | POA: Diagnosis not present

## 2017-10-07 DIAGNOSIS — Z794 Long term (current) use of insulin: Secondary | ICD-10-CM | POA: Diagnosis not present

## 2017-10-07 DIAGNOSIS — F1410 Cocaine abuse, uncomplicated: Secondary | ICD-10-CM | POA: Diagnosis not present

## 2017-10-07 DIAGNOSIS — I10 Essential (primary) hypertension: Secondary | ICD-10-CM | POA: Insufficient documentation

## 2017-10-07 DIAGNOSIS — F250 Schizoaffective disorder, bipolar type: Secondary | ICD-10-CM | POA: Insufficient documentation

## 2017-10-07 DIAGNOSIS — R42 Dizziness and giddiness: Secondary | ICD-10-CM | POA: Insufficient documentation

## 2017-10-07 DIAGNOSIS — R1084 Generalized abdominal pain: Secondary | ICD-10-CM | POA: Insufficient documentation

## 2017-10-07 DIAGNOSIS — I252 Old myocardial infarction: Secondary | ICD-10-CM | POA: Insufficient documentation

## 2017-10-07 DIAGNOSIS — R45851 Suicidal ideations: Secondary | ICD-10-CM | POA: Diagnosis not present

## 2017-10-07 DIAGNOSIS — Z7982 Long term (current) use of aspirin: Secondary | ICD-10-CM | POA: Diagnosis not present

## 2017-10-07 LAB — COMPREHENSIVE METABOLIC PANEL
ALBUMIN: 3.6 g/dL (ref 3.5–5.0)
ALK PHOS: 75 U/L (ref 38–126)
ALT: 23 U/L (ref 17–63)
ANION GAP: 11 (ref 5–15)
AST: 17 U/L (ref 15–41)
BUN: 9 mg/dL (ref 6–20)
CHLORIDE: 104 mmol/L (ref 101–111)
CO2: 21 mmol/L — AB (ref 22–32)
CREATININE: 1.13 mg/dL (ref 0.61–1.24)
Calcium: 8.9 mg/dL (ref 8.9–10.3)
GFR calc Af Amer: >60 mL/min (ref >60)
GFR calc non Af Amer: >60 mL/min (ref >60)
GLUCOSE: 301 mg/dL — AB (ref 65–99)
Potassium: 4 mmol/L (ref 3.5–5.1)
SODIUM: 136 mmol/L (ref 135–145)
Total Bilirubin: 0.5 mg/dL (ref 0.3–1.2)
Total Protein: 6.7 g/dL (ref 6.5–8.1)

## 2017-10-07 LAB — SALICYLATE LEVEL: Salicylate Lvl: <7 mg/dL (ref 2.8–30.0)

## 2017-10-07 LAB — ACETAMINOPHEN LEVEL

## 2017-10-07 LAB — CBC
HEMATOCRIT: 42.4 % (ref 39.0–52.0)
HEMOGLOBIN: 14 g/dL (ref 13.0–17.0)
MCH: 27.1 pg (ref 26.0–34.0)
MCHC: 33 g/dL (ref 30.0–36.0)
MCV: 82 fL (ref 78.0–100.0)
Platelets: 241 10*3/uL (ref 150–400)
RBC: 5.17 MIL/uL (ref 4.22–5.81)
RDW: 14.8 % (ref 11.5–15.5)
WBC: 6 10*3/uL (ref 4.0–10.5)

## 2017-10-07 LAB — RAPID URINE DRUG SCREEN, HOSP PERFORMED
AMPHETAMINES: NOT DETECTED
BARBITURATES: NOT DETECTED
Benzodiazepines: NOT DETECTED
COCAINE: POSITIVE — AB
OPIATES: NOT DETECTED
TETRAHYDROCANNABINOL: NOT DETECTED

## 2017-10-07 LAB — ETHANOL: Alcohol, Ethyl (B): <10 mg/dL (ref <10)

## 2017-10-07 MED ORDER — INSULIN ASPART PROT & ASPART (70-30 MIX) 100 UNIT/ML ~~LOC~~ SUSP
60.0000 [IU] | Freq: Every day | SUBCUTANEOUS | Status: DC
  Administered 2017-10-08: 60 [IU] via SUBCUTANEOUS
  Filled 2017-10-07: qty 10

## 2017-10-07 MED ORDER — NICOTINE 14 MG/24HR TD PT24
14.0000 mg | MEDICATED_PATCH | Freq: Every day | TRANSDERMAL | Status: DC
  Administered 2017-10-08: 14 mg via TRANSDERMAL
  Filled 2017-10-07 (×2): qty 1

## 2017-10-07 MED ORDER — METOPROLOL TARTRATE 25 MG PO TABS
25.0000 mg | ORAL_TABLET | Freq: Two times a day (BID) | ORAL | Status: DC
  Administered 2017-10-08 (×2): 25 mg via ORAL
  Filled 2017-10-07 (×2): qty 1

## 2017-10-07 MED ORDER — INSULIN ASPART PROT & ASPART (70-30 MIX) 100 UNIT/ML ~~LOC~~ SUSP
45.0000 [IU] | Freq: Every day | SUBCUTANEOUS | Status: DC
  Filled 2017-10-07: qty 10

## 2017-10-07 MED ORDER — HYDROXYZINE HCL 25 MG PO TABS: 25.0000 mg | ORAL_TABLET | Freq: Three times a day (TID) | ORAL | Status: DC | PRN

## 2017-10-07 MED ORDER — GABAPENTIN 300 MG PO CAPS
300.0000 mg | ORAL_CAPSULE | Freq: Two times a day (BID) | ORAL | Status: DC
  Administered 2017-10-08 (×2): 300 mg via ORAL
  Filled 2017-10-07 (×2): qty 1

## 2017-10-07 MED ORDER — ATORVASTATIN CALCIUM 40 MG PO TABS
40.0000 mg | ORAL_TABLET | Freq: Every day | ORAL | Status: DC
Start: 2017-10-08 — End: 2017-10-08
  Administered 2017-10-08: 40 mg via ORAL
  Filled 2017-10-07 (×2): qty 1

## 2017-10-07 MED ORDER — INSULIN ASPART PROT & ASPART (70-30 MIX) 100 UNIT/ML ~~LOC~~ SUSP: 45.0000 [IU] | Freq: Two times a day (BID) | SUBCUTANEOUS | Status: DC

## 2017-10-07 MED ORDER — RISPERIDONE 1 MG PO TABS
1.0000 mg | ORAL_TABLET | Freq: Two times a day (BID) | ORAL | Status: DC
  Administered 2017-10-08 (×2): 1 mg via ORAL
  Filled 2017-10-07 (×3): qty 1

## 2017-10-07 MED ORDER — HYDROCHLOROTHIAZIDE 25 MG PO TABS
25.0000 mg | ORAL_TABLET | Freq: Every day | ORAL | Status: DC
Start: 2017-10-07 — End: 2017-10-08
  Administered 2017-10-08 (×2): 25 mg via ORAL
  Filled 2017-10-07 (×2): qty 1

## 2017-10-07 MED ORDER — SERTRALINE HCL 50 MG PO TABS
50.0000 mg | ORAL_TABLET | Freq: Every day | ORAL | Status: DC
  Administered 2017-10-08: 50 mg via ORAL
  Filled 2017-10-07 (×2): qty 1

## 2017-10-07 NOTE — ED Notes (Signed)
Changed into scrubs and wanded by security

## 2017-10-07 NOTE — ED Provider Notes (Signed)
[facility] Provider Note   CSN: [id number] Arrival date & time: 10/07/17  1647     History   Chief Complaint Chief Complaint  Patient presents with  . Suicidal    HPI Charles Orr is a 57 y.o. male who presents with depression and SI. PMH significant for chronic pain, depression, schizophrenia. He states that over the past several weeks he has had gradually worsening symptoms of depression. He has been off his medicines because he lost them and attributes his symptoms to this. [facility]. He was not evaluated there and left without being seen. He went to go jump off a bridge but someone was nearby so instead he used their phone to call police who sent him here. He also has thoughts of hurting other because he did some painting of the home where he lives and wasn't compensated. He also admits to hearing voices at times. He states he used cocaine on Friday.  On review of EMR, despite the patient's claims he has had multiple evaluations for SI and dizziness. There was concern for malingering. He denies fever, chills, chest pain, SOB. He reports generalized abdominal pain which has also been evaluated in the past several days. He has chronic pain from degenerative disc disease and fibromyalgia which has not worsened from baseline.  HPI  Past Medical History:  Diagnosis Date  . Anxiety   . Arthritis    "back" (06/12/2017)  . Bipolar disorder (HCC)   . CAP (community acquired pneumonia) 06/10/2017  . Chronic lower back pain   . Degenerative disc disease, lumbar   . Depression   . Fibromyalgia   . GERD (gastroesophageal reflux disease)   . High cholesterol   . Hypertension   . Myocardial infarction (HCC) 2002  . Pneumonia 1989  . Schizophrenia (HCC)   . Type II diabetes mellitus [facility](HCC)     Patient Active Problem List   Diagnosis Date Noted  . Polysubstance (including opioids) dependence  with physiological dependence (HCC) 10/06/2017  . 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 Chest pain 07/10/2017  . Angina pectoris (HCC) 07/09/2017  . Tobacco abuse 07/09/2017  . Hypertension 07/09/2017  . Type II diabetes mellitus (HCC)   . GERD (gastroesophageal reflux disease)   . Chronic lower back pain   . Poorly controlled diabetes mellitus (HCC)   . Hyperlipidemia with target LDL less than 70   . Schizoaffective disorder, bipolar type (HCC) 06/16/2017  . Depression 06/15/2017  . Other schizophrenia (HCC)   . Pneumonia 06/11/2017  . Community acquired pneumonia of right lower lobe of lung (HCC)   . Hyperglycemia   . Localized edema   . Shortness of breath 06/10/2017    Past Surgical History:  Procedure Laterality Date  . CARDIAC CATHETERIZATION  12/2006   [name]/notes 01/18/2011  . INGUINAL HERNIA REPAIR Right   . LEFT HEART CATH AND CORONARY ANGIOGRAPHY N/A 07/11/2017   Procedure: LEFT HEART CATH AND CORONARY ANGIOGRAPHY;  Surgeon: Tonny Bollmanooper, Michael, MD;  [facility];  Service: Cardiovascular;  Laterality: N/A;  . PARATHYROIDECTOMY    . PERICARDIAL TAP  2000   ?; in [place]/notes 01/18/2011       Home Medications    Prior to Admission medications   Medication Sig Start Date End Date Taking? Authorizing Provider  aspirin 81 MG EC tablet Take 1 tablet (81 mg total) by  mouth daily. For heart health 06/26/17   Armandina Stammer I, NP  atorvastatin (LIPITOR) 40 MG tablet Take 1 tablet (40 mg total) by mouth daily. For high Cholesterol 06/26/17   Armandina Stammer I, NP  dicyclomine (BENTYL) 20 MG tablet Take 1 tablet (20 mg total) by mouth 2 (two) times daily. 08/04/17   Petrucelli, Samantha R, PA-C  gabapentin (NEURONTIN) 300 MG capsule Take 1 capsule (300 mg total) by mouth 2 (two) times daily. For agitation 06/26/17   Armandina Stammer I, NP  hydrochlorothiazide (HYDRODIURIL) 25 MG tablet Take 1 tablet (25 mg total) by  mouth daily. For high blood pressure 09/26/17   Charlynne Pander, MD  hydrOXYzine (ATARAX/VISTARIL) 25 MG tablet Take 1 tablet (25 mg total) by mouth 3 (three) times daily as needed for anxiety. 06/26/17   Armandina Stammer I, NP  insulin aspart protamine- aspart (NOVOLOG MIX 70/30) (70-30) 100 UNIT/ML injection Inject 0.45-0.6 mLs (45-60 Units total) into the skin 2 (two) times daily with a meal. 60 units in the morning and 45 units at dinner time 09/26/17   Charlynne Pander, MD  lidocaine (LIDODERM) 5 % Place 1 patch onto the skin daily. Remove & Discard patch within 12 hours or as directed by MD: For pain management Patient not taking: Reported on 07/07/2017 06/27/17   Armandina Stammer I, NP  meclizine (ANTIVERT) 25 MG tablet Take 1 tablet (25 mg total) by mouth 3 (three) times daily as needed for dizziness. 09/26/17   Charlynne Pander, MD  metoprolol tartrate (LOPRESSOR) 25 MG tablet Take 1 tablet (25 mg total) by mouth 2 (two) times daily. For high blood pressure 09/26/17   Charlynne Pander, MD  Multiple Vitamin (MULTIVITAMIN WITH MINERALS) TABS tablet Take 1 tablet by mouth daily. For Vitamin supplement 06/26/17   Armandina Stammer I, NP  nicotine (NICODERM CQ - DOSED IN MG/24 HOURS) 14 mg/24hr patch Place 1 patch (14 mg total) onto the skin daily. (May purchase over the counter): For smoking cessation Patient not taking: Reported on 07/07/2017 06/27/17   Armandina Stammer I, NP  nitroGLYCERIN (NITROSTAT) 0.4 MG SL tablet Place 1 tablet (0.4 mg total) every 5 (five) minutes as needed under the tongue for chest pain. 07/11/17   Joseph Art, DO  ranitidine (ZANTAC) 150 MG tablet Take 1 tablet (150 mg total) by mouth 2 (two) times daily. 08/11/17   Street, [place], PA-C  risperiDONE (RISPERDAL) 1 MG tablet Take 1 tablet (1 mg) by mouth in the mornings & 2 tablets (2 mg) at bedtime: For mood control Patient taking differently: Take 1 mg by mouth 2 (two) times daily.  06/26/17   Armandina Stammer I, NP  sertraline  (ZOLOFT) 50 MG tablet Take 1 tablet (50 mg total) by mouth daily. For depression 06/27/17   Sanjuana Kava, NP    Family History Family History  Problem Relation Age of Onset  . Hypertension Mother   . Diabetes Mother   . Heart disease Father   . Hypertension Sister   . Diabetes Sister   . Hypertension Brother   . Diabetes Brother   . Hypertension Sister   . Diabetes Sister   . Hypertension Brother   . Diabetes Brother     Social History Social History   Tobacco Use  . Smoking status: Current Every Day Smoker    Packs/day: 0.50    Years: 38.00    Pack years: 19.00    Types: Cigarettes  . Smokeless tobacco: Current User  Types: Chew  Substance Use Topics  . Alcohol use: Yes    Comment: 06/12/2017 "stopped ~ 1 yr ago"   . Drug use: Yes    Types: Cocaine    Comment: 06/12/2017 "stopped using cocaine ~ 1 yr ago; used marijuana when I was younger" last cocaine use 9 mths ago per pt on 06/15/17     Allergies   Patient has no known allergies.   Review of Systems Review of Systems  Constitutional: Negative for chills and fever.  Respiratory: Negative for shortness of breath.   Cardiovascular: Negative for chest pain.  Gastrointestinal: Positive for abdominal pain. Negative for diarrhea, nausea and vomiting.  Neurological: Positive for dizziness (chronic). Negative for headaches.  Psychiatric/Behavioral: Positive for dysphoric mood, hallucinations and suicidal ideas. Negative for self-injury.  All other systems reviewed and are negative.    Physical Exam Updated Vital Signs BP (!) 147/92   Pulse 88   Temp 98.4 F (36.9 C) (Oral)   Resp 18   SpO2 99%   Physical Exam  Constitutional: He is oriented to person, place, and time. He appears well-developed and well-nourished. No distress.  HENT:  Head: Normocephalic and atraumatic.  Eyes: Conjunctivae are normal. Pupils are equal, round, and reactive to light. Right eye exhibits no discharge. Left eye exhibits no  discharge. No scleral icterus.  Neck: Normal range of motion.  Cardiovascular: Normal rate and regular rhythm. Exam reveals no gallop and no friction rub.  No murmur heard. Pulmonary/Chest: Effort normal and breath sounds normal. No stridor. No respiratory distress. He has no wheezes. He has no rales. He exhibits no tenderness.  Abdominal: Soft. He exhibits no distension. There is no tenderness.  Neurological: He is alert and oriented to person, place, and time.  Skin: Skin is warm and dry.  Psychiatric: He has a normal mood and affect. His behavior is normal.  Nursing note and vitals reviewed.    ED Treatments / Results  Labs (all labs ordered are listed, but only abnormal results are displayed) Labs Reviewed  COMPREHENSIVE METABOLIC PANEL - Abnormal; Notable for the following components:      Result Value   CO2 21 (*)    Glucose, Bld 301 (*)    All other components within normal limits  ACETAMINOPHEN LEVEL - Abnormal; Notable for the following components:   Acetaminophen (Tylenol), Serum <10 (*)    All other components within normal limits  RAPID URINE DRUG SCREEN, HOSP PERFORMED - Abnormal; Notable for the following components:   Cocaine POSITIVE (*)    All other components within normal limits  ETHANOL  SALICYLATE LEVEL  CBC  CBG MONITORING, ED    EKG  EKG Interpretation None       Radiology No results found.  Procedures Procedures (including critical care time)  Medications Ordered in ED Medications  atorvastatin (LIPITOR) tablet 40 mg (not administered)  gabapentin (NEURONTIN) capsule 300 mg (not administered)  hydrochlorothiazide (HYDRODIURIL) tablet 25 mg (not administered)  hydrOXYzine (ATARAX/VISTARIL) tablet 25 mg (not administered)  insulin aspart protamine- aspart (NOVOLOG MIX 70/30) injection 45-60 Units (not administered)  metoprolol tartrate (LOPRESSOR) tablet 25 mg (not administered)  nicotine (NICODERM CQ - dosed in mg/24 hours) patch 14 mg  (not administered)  sertraline (ZOLOFT) tablet 50 mg (not administered)  risperiDONE (RISPERDAL) tablet 1 mg (not administered)     Initial Impression / Assessment and Plan / ED Course  I have reviewed the triage vital signs and the nursing notes.  Pertinent labs & imaging results  that were available during my care of the patient were reviewed by me and considered in my medical decision making (see chart for details).  57 year old male presents with SI. He is mildly hypertensive. All other vitals are normal. He is hyperglycemic (301). No evidence of DKA. Abdomen is soft, benign. UDS is positive for cocaine. He is medically cleared for TTS.  Final Clinical Impressions(s) / ED Diagnoses   Final diagnoses:  Suicidal ideation    ED Discharge Orders    None       Bethel Born, PA-C 10/07/17 2050    Loren Racer, MD 10/11/17 [phone]

## 2017-10-07 NOTE — ED Triage Notes (Signed)
Patient complains of suicidal thoughts and called [employer] to bring him here so he want jump from bridge. States that he has been off his BH meds for 3 weeks. Reports using ETOH, crack and heroin on Friday. Alert and oriented. VSS, NAD. [facility] this am

## 2017-10-07 NOTE — ED Notes (Signed)
Dinner tray ordered.

## 2017-10-07 NOTE — ED Notes (Signed)
Pt belongings placed in locker number 6 

## 2017-10-07 NOTE — ED Notes (Signed)
Pt wanded by security. 

## 2017-10-07 NOTE — BHH Counselor (Addendum)
Clinician contacted Clydie BraunKaren, RN to set up tele-assessment cart for pt. Clinician noted the pt is in that hall and there are currently no private rooms availble. Clinician provided Clydie BraunKaren, RN her number [phone]) and asked her to call once the pt is able to engage in the TTS consult.   Charles Pullingreylese D Maurisha Mongeau, MS, [facility], [facility] Triage Specialist [phone]

## 2017-10-07 NOTE — ED Notes (Signed)
Staffing notified of needed sitter 

## 2017-10-08 ENCOUNTER — Encounter (HOSPITAL_COMMUNITY): Payer: Self-pay | Admitting: *Deleted

## 2017-10-08 ENCOUNTER — Other Ambulatory Visit: Payer: Self-pay

## 2017-10-08 ENCOUNTER — Inpatient Hospital Stay (HOSPITAL_COMMUNITY)
Admission: AD | Admit: 2017-10-08 | Discharge: 2017-10-18 | DRG: 885 | Disposition: A | Discharge status: Home / Self Care | Payer: Medicaid Other | Source: Intra-hospital | Attending: Psychiatry | Admitting: Psychiatry

## 2017-10-08 DIAGNOSIS — R45850 Homicidal ideations: Secondary | ICD-10-CM | POA: Diagnosis present

## 2017-10-08 DIAGNOSIS — E785 Hyperlipidemia, unspecified: Secondary | ICD-10-CM | POA: Diagnosis present

## 2017-10-08 DIAGNOSIS — Z8701 Personal history of pneumonia (recurrent): Secondary | ICD-10-CM

## 2017-10-08 DIAGNOSIS — R450 Nervousness: Secondary | ICD-10-CM | POA: Diagnosis not present

## 2017-10-08 DIAGNOSIS — I10 Essential (primary) hypertension: Secondary | ICD-10-CM | POA: Diagnosis present

## 2017-10-08 DIAGNOSIS — I252 Old myocardial infarction: Secondary | ICD-10-CM | POA: Diagnosis not present

## 2017-10-08 DIAGNOSIS — Z7982 Long term (current) use of aspirin: Secondary | ICD-10-CM

## 2017-10-08 DIAGNOSIS — M545 Low back pain: Secondary | ICD-10-CM | POA: Diagnosis present

## 2017-10-08 DIAGNOSIS — R45851 Suicidal ideations: Secondary | ICD-10-CM | POA: Diagnosis present

## 2017-10-08 DIAGNOSIS — F14151 Cocaine abuse with cocaine-induced psychotic disorder with hallucinations: Secondary | ICD-10-CM | POA: Diagnosis present

## 2017-10-08 DIAGNOSIS — F1490 Cocaine use, unspecified, uncomplicated: Secondary | ICD-10-CM | POA: Diagnosis not present

## 2017-10-08 DIAGNOSIS — R441 Visual hallucinations: Secondary | ICD-10-CM | POA: Diagnosis not present

## 2017-10-08 DIAGNOSIS — F1920 Other psychoactive substance dependence, uncomplicated: Secondary | ICD-10-CM | POA: Diagnosis present

## 2017-10-08 DIAGNOSIS — Z79899 Other long term (current) drug therapy: Secondary | ICD-10-CM

## 2017-10-08 DIAGNOSIS — Z794 Long term (current) use of insulin: Secondary | ICD-10-CM

## 2017-10-08 DIAGNOSIS — F329 Major depressive disorder, single episode, unspecified: Secondary | ICD-10-CM | POA: Diagnosis not present

## 2017-10-08 DIAGNOSIS — Z590 Homelessness: Secondary | ICD-10-CM | POA: Diagnosis not present

## 2017-10-08 DIAGNOSIS — K219 Gastro-esophageal reflux disease without esophagitis: Secondary | ICD-10-CM | POA: Diagnosis present

## 2017-10-08 DIAGNOSIS — Z8249 Family history of ischemic heart disease and other diseases of the circulatory system: Secondary | ICD-10-CM

## 2017-10-08 DIAGNOSIS — G4700 Insomnia, unspecified: Secondary | ICD-10-CM | POA: Diagnosis present

## 2017-10-08 DIAGNOSIS — E119 Type 2 diabetes mellitus without complications: Secondary | ICD-10-CM | POA: Diagnosis present

## 2017-10-08 DIAGNOSIS — G8929 Other chronic pain: Secondary | ICD-10-CM | POA: Diagnosis present

## 2017-10-08 DIAGNOSIS — F3164 Bipolar disorder, current episode mixed, severe, with psychotic features: Principal | ICD-10-CM | POA: Diagnosis present

## 2017-10-08 DIAGNOSIS — F17210 Nicotine dependence, cigarettes, uncomplicated: Secondary | ICD-10-CM | POA: Diagnosis present

## 2017-10-08 DIAGNOSIS — F419 Anxiety disorder, unspecified: Secondary | ICD-10-CM | POA: Diagnosis present

## 2017-10-08 DIAGNOSIS — F1210 Cannabis abuse, uncomplicated: Secondary | ICD-10-CM | POA: Diagnosis not present

## 2017-10-08 DIAGNOSIS — M5136 Other intervertebral disc degeneration, lumbar region: Secondary | ICD-10-CM | POA: Diagnosis present

## 2017-10-08 DIAGNOSIS — E7800 Pure hypercholesterolemia, unspecified: Secondary | ICD-10-CM | POA: Diagnosis present

## 2017-10-08 DIAGNOSIS — Z560 Unemployment, unspecified: Secondary | ICD-10-CM | POA: Diagnosis not present

## 2017-10-08 DIAGNOSIS — M797 Fibromyalgia: Secondary | ICD-10-CM | POA: Diagnosis present

## 2017-10-08 DIAGNOSIS — F1110 Opioid abuse, uncomplicated: Secondary | ICD-10-CM | POA: Diagnosis not present

## 2017-10-08 DIAGNOSIS — Z833 Family history of diabetes mellitus: Secondary | ICD-10-CM | POA: Diagnosis not present

## 2017-10-08 DIAGNOSIS — F1410 Cocaine abuse, uncomplicated: Secondary | ICD-10-CM | POA: Diagnosis not present

## 2017-10-08 DIAGNOSIS — F1411 Cocaine abuse, in remission: Secondary | ICD-10-CM | POA: Diagnosis not present

## 2017-10-08 DIAGNOSIS — R440 Auditory hallucinations: Secondary | ICD-10-CM | POA: Diagnosis not present

## 2017-10-08 HISTORY — DX: Bipolar disorder, current episode mixed, severe, with psychotic features: F31.64

## 2017-10-08 LAB — GLUCOSE, CAPILLARY
GLUCOSE-CAPILLARY: 268 mg/dL — AB (ref 65–99)
GLUCOSE-CAPILLARY: 220 mg/dL — AB (ref 65–99)

## 2017-10-08 LAB — CBG MONITORING, ED: Glucose-Capillary: 272 mg/dL — ABNORMAL HIGH (ref 65–99)

## 2017-10-08 MED ORDER — ALUM & MAG HYDROXIDE-SIMETH 200-200-20 MG/5ML PO SUSP
30.0000 mL | ORAL | Status: DC | PRN
  Administered 2017-10-09: 30 mL via ORAL
  Filled 2017-10-08: qty 30

## 2017-10-08 MED ORDER — TRAZODONE HCL 50 MG PO TABS
50.0000 mg | ORAL_TABLET | Freq: Every evening | ORAL | Status: DC | PRN
  Administered 2017-10-08 – 2017-10-13 (×6): 50 mg via ORAL
  Filled 2017-10-08 (×6): qty 1

## 2017-10-08 MED ORDER — RISPERIDONE 1 MG PO TABS
1.0000 mg | ORAL_TABLET | Freq: Two times a day (BID) | ORAL | Status: DC
  Administered 2017-10-08 – 2017-10-10 (×4): 1 mg via ORAL
  Filled 2017-10-08 (×6): qty 1

## 2017-10-08 MED ORDER — HYDROXYZINE HCL 25 MG PO TABS
25.0000 mg | ORAL_TABLET | Freq: Three times a day (TID) | ORAL | Status: DC | PRN
Start: 2017-10-08 — End: 2017-10-15
  Administered 2017-10-08 – 2017-10-15 (×13): 25 mg via ORAL
  Filled 2017-10-08 (×12): qty 1

## 2017-10-08 MED ORDER — METOPROLOL TARTRATE 25 MG PO TABS
25.0000 mg | ORAL_TABLET | Freq: Two times a day (BID) | ORAL | Status: DC
  Administered 2017-10-08 – 2017-10-17 (×17): 25 mg via ORAL
  Filled 2017-10-08 (×24): qty 1

## 2017-10-08 MED ORDER — GABAPENTIN 300 MG PO CAPS
300.0000 mg | ORAL_CAPSULE | Freq: Two times a day (BID) | ORAL | Status: DC
  Administered 2017-10-08 – 2017-10-11 (×6): 300 mg via ORAL
  Filled 2017-10-08 (×8): qty 1

## 2017-10-08 MED ORDER — ATORVASTATIN CALCIUM 40 MG PO TABS
40.0000 mg | ORAL_TABLET | Freq: Every day | ORAL | Status: DC
  Administered 2017-10-09 – 2017-10-18 (×10): 40 mg via ORAL
  Filled 2017-10-08 (×12): qty 1

## 2017-10-08 MED ORDER — MAGNESIUM HYDROXIDE 400 MG/5ML PO SUSP: 30.0000 mL | Freq: Every day | ORAL | Status: DC | PRN

## 2017-10-08 MED ORDER — ACETAMINOPHEN 325 MG PO TABS
650.0000 mg | ORAL_TABLET | Freq: Four times a day (QID) | ORAL | Status: DC | PRN
  Administered 2017-10-08: 650 mg via ORAL
  Filled 2017-10-08: qty 2

## 2017-10-08 MED ORDER — INSULIN ASPART PROT & ASPART (70-30 MIX) 100 UNIT/ML ~~LOC~~ SUSP
60.0000 [IU] | Freq: Every day | SUBCUTANEOUS | Status: DC
Start: 2017-10-09 — End: 2017-10-18
  Administered 2017-10-09 – 2017-10-18 (×10): 60 [IU] via SUBCUTANEOUS

## 2017-10-08 MED ORDER — HYDROCHLOROTHIAZIDE 25 MG PO TABS
25.0000 mg | ORAL_TABLET | Freq: Every day | ORAL | Status: DC
Start: 2017-10-09 — End: 2017-10-18
  Administered 2017-10-09 – 2017-10-18 (×10): 25 mg via ORAL
  Filled 2017-10-08 (×14): qty 1

## 2017-10-08 MED ORDER — NICOTINE 14 MG/24HR TD PT24
14.0000 mg | MEDICATED_PATCH | Freq: Every day | TRANSDERMAL | Status: DC
  Administered 2017-10-09 – 2017-10-18 (×10): 14 mg via TRANSDERMAL
  Filled 2017-10-08 (×12): qty 1

## 2017-10-08 MED ORDER — INSULIN ASPART PROT & ASPART (70-30 MIX) 100 UNIT/ML ~~LOC~~ SUSP
45.0000 [IU] | Freq: Every day | SUBCUTANEOUS | Status: DC
  Administered 2017-10-08 – 2017-10-17 (×10): 45 [IU] via SUBCUTANEOUS

## 2017-10-08 MED ORDER — SERTRALINE HCL 50 MG PO TABS
50.0000 mg | ORAL_TABLET | Freq: Every day | ORAL | Status: DC
  Administered 2017-10-09 – 2017-10-13 (×5): 50 mg via ORAL
  Filled 2017-10-08 (×7): qty 1

## 2017-10-08 NOTE — Progress Notes (Signed)
Charles Orr is a 57 year old male pt admitted on voluntary basis. On admission, Charles Orr endorses he has been feeling depressed and suicidal recently, however denies any SI on admission and able to contract for safety while on the unit. He reports that he has been off his meds for the past month and a half and reports he has used cocaine during that time. He reports that he is homeless and reports that he would like help finding somewhere to go. Charles Orr was oriented to the unit and safety maintained
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
## 2017-10-08 NOTE — ED Notes (Signed)
Pt ambulated to the bathroom with sitter present

## 2017-10-08 NOTE — ED Notes (Signed)
Regular Diet was Ordered for Lunch. 

## 2017-10-08 NOTE — ED Notes (Signed)
Meal ordered for PT, insulin will be administered upon meal arrival

## 2017-10-08 NOTE — Progress Notes (Signed)
[facility] after 1:15pm today.  Room: 505-1 Report:  [phone]  Patient is voluntary and can arrive by [place] after 1:15pm  [facility] and aware of plan.  No other needs.  Charles EmoryHannah Jaeliana Lococo LCSW, MSW Clinical Social Work: Optician, dispensingystem Wide Float Coverage for :  [phone]

## 2017-10-08 NOTE — BHH Counselor (Signed)
Clinician called tele-assessment cart to complete. Marquita PalmsMario, RN ansswered cart and searched for the pt, however the pt is currently in the hall. Clinician was informed pt's RN will call clinician in about 30 minutes to complete TTS consult.    Redmond Pullingreylese D Aviel Davalos, MS, [facility], [facility] Triage Specialist [phone]

## 2017-10-08 NOTE — ED Notes (Signed)
Christa, [facility]

## 2017-10-08 NOTE — Progress Notes (Signed)
Patient states he had a cell phone when he came in.  His items were thoroughly searched by two staff members and no cell phone was discovered.  Patient did have 4 bags with his in the ED.

## 2017-10-08 NOTE — ED Notes (Signed)
PT ambulates to restroom. PT aware that breakfast has been ordered. No requests at this time. PT returns to room with sitter.

## 2017-10-08 NOTE — [facility] Counselor (Signed)
Clinician contacted Sue LushAndrea, RN and asked if the pt was in the hallway. Sue LushAndrea, RN reported, she will put the pt in a triage room so TTS consult can be completed. Sue LushAndrea, RN reported, she will call clinician back in about five minutes.    Redmond Pullingreylese D Legend Tumminello, MS, [facility], [facility] Triage Specialist [phone]

## 2017-10-08 NOTE — ED Notes (Signed)
CBG 272. 

## 2017-10-08 NOTE — Progress Notes (Signed)
Psychoeducational Group Note  Date:  10/08/2017 Time:  2118  Group Topic/Focus:  Wrap-Up Group:   The focus of this group is to help patients review their daily goal of treatment and discuss progress on daily workbooks.  Participation Level: Did Not Attend  Participation Quality:  Not Applicable  Affect:  Not Applicable  Cognitive:  Not Applicable  Insight:  Not Applicable  Engagement in Group: Not Applicable  Additional Comments:  The patient did not attend group since he remained in his bed.   Hazle CocaGOODMAN, Rasheena Talmadge S 10/08/2017, 9:18 PM

## 2017-10-08 NOTE — Tx Team (Signed)
Initial Treatment Plan 10/08/2017 3:13 PM Charles Orr BMW:[id number]    PATIENT STRESSORS: Financial difficulties Health problems Medication change or noncompliance Substance abuse   PATIENT STRENGTHS: Ability for insight Average or above average intelligence Capable of independent living General fund of knowledge   PATIENT IDENTIFIED PROBLEMS: Depression Suicidal thoughts Cocaine Abuse "Would like to get back on my medications"                     DISCHARGE CRITERIA:  Ability to meet basic life and health needs Improved stabilization in mood, thinking, and/or behavior Reduction of life-threatening or endangering symptoms to within safe limits Verbal commitment to aftercare and medication compliance  PRELIMINARY DISCHARGE PLAN: Attend aftercare/continuing care group Placement in alternative living arrangements  PATIENT/FAMILY INVOLVEMENT: This treatment plan has been presented to and reviewed with the patient, Charles Orr, and/or family member, .  The patient and family have been given the opportunity to ask questions and make suggestions.  Charles Orr, Charles Orr, [place]RN 10/08/2017, 3:13 PM

## 2017-10-09 DIAGNOSIS — R45851 Suicidal ideations: Secondary | ICD-10-CM

## 2017-10-09 DIAGNOSIS — F1110 Opioid abuse, uncomplicated: Secondary | ICD-10-CM

## 2017-10-09 DIAGNOSIS — F1210 Cannabis abuse, uncomplicated: Secondary | ICD-10-CM

## 2017-10-09 DIAGNOSIS — Z590 Homelessness: Secondary | ICD-10-CM

## 2017-10-09 DIAGNOSIS — F3164 Bipolar disorder, current episode mixed, severe, with psychotic features: Principal | ICD-10-CM

## 2017-10-09 DIAGNOSIS — R441 Visual hallucinations: Secondary | ICD-10-CM

## 2017-10-09 DIAGNOSIS — R450 Nervousness: Secondary | ICD-10-CM

## 2017-10-09 DIAGNOSIS — R45850 Homicidal ideations: Secondary | ICD-10-CM

## 2017-10-09 DIAGNOSIS — R440 Auditory hallucinations: Secondary | ICD-10-CM

## 2017-10-09 DIAGNOSIS — F17210 Nicotine dependence, cigarettes, uncomplicated: Secondary | ICD-10-CM

## 2017-10-09 DIAGNOSIS — Z560 Unemployment, unspecified: Secondary | ICD-10-CM

## 2017-10-09 DIAGNOSIS — F1410 Cocaine abuse, uncomplicated: Secondary | ICD-10-CM

## 2017-10-09 DIAGNOSIS — F419 Anxiety disorder, unspecified: Secondary | ICD-10-CM

## 2017-10-09 LAB — GLUCOSE, CAPILLARY
GLUCOSE-CAPILLARY: 126 mg/dL — AB (ref 65–99)
GLUCOSE-CAPILLARY: 208 mg/dL — AB (ref 65–99)
Glucose-Capillary: 385 mg/dL — ABNORMAL HIGH (ref 65–99)
Glucose-Capillary: 289 mg/dL — ABNORMAL HIGH (ref 65–99)

## 2017-10-09 NOTE — Progress Notes (Signed)
DAR NOTE: Patient presents with flat affect and depressed mood.  Reports suicidal thoughts on self inventory form but verbally contracts for safety.  Denies auditory and visual hallucinations.  Described energy level as low and concentration as poor.  Rates depression at 8, hopelessness at 8, and anxiety at 8.  Maintained on routine safety checks.  Medications given as prescribed.  Support and encouragement offered as needed.  Attended group and participated.  States goal for today is "energy."  Patient stayed in his room most of this shift.  Minimal interactions with staff and peers.

## 2017-10-09 NOTE — [facility] Suicide Risk Assessment (Signed)
[facility] INPATIENT:  Family/Significant Other Suicide Prevention Education  Suicide Prevention Education:  Patient Refusal for Family/Significant Other Suicide Prevention Education: The patient Charles Orr has refused to provide written consent for family/significant other to be provided Family/Significant Other Suicide Prevention Education during admission and/or prior to discharge.  Physician notified.  Baldo DaubRodney B [facility] 10/09/2017, 10:52 AM

## 2017-10-09 NOTE — Progress Notes (Signed)
Pt did not attend orientation group.  

## 2017-10-09 NOTE — Progress Notes (Signed)
Recreation Therapy Notes  Date: 10/09/17 Time: 0945 Location: 500 Hall Dayroom  Group Topic: Anxiety  Goal Area(s) Addresses:  Patient will identify triggers for anxiety.  Patient will identify physical reaction to anxiety.   Patient will identify coping skills for anxiety.  Intervention: Worksheet  Activity: Introduction to Anxiety.  Patients were to identify their triggers to anxiety, the physical symptoms they experience from anxiety, the thoughts they have with anxious and three coping skills to deal with anxiety.  Education: Anxiety, Discharge Planning   Education Outcome: Acknowledges education/In group clarification offered/Needs additional education.   Clinical Observations/Feedback: Pt did not attend group.   Caroll RancherMarjette Juanell Saffo, LRT/CTRS          Lillia AbedLindsay, Roselind Klus A 10/09/2017 11:25 AM

## 2017-10-09 NOTE — Progress Notes (Signed)
Inpatient Diabetes Program Recommendations  AACE/ADA: New Consensus Statement on Inpatient Glycemic Control (2015)  Target Ranges:  Prepandial:   less than 140 mg/dL      Peak postprandial:   less than 180 mg/dL (1-2 hours)      Critically ill patients:  140 - 180 mg/dL   Results for Arlis PortaROSS, Mae J (MRN [id number]) as of 10/09/2017 15:54  Ref. Range 10/09/2017 06:00 10/09/2017 11:56  Glucose-Capillary Latest Ref Range: 65 - 99 mg/dL 409126 (H) 811208 (H)    Home DM Meds: 70/30 Insulin- 60 units AM/ 45 units PM  Current Insulin Orders: 70/30 Insulin- 60 units AM/ 45 units PM        MD- Please consider starting Novolog Moderate Correction Scale/ SSI (0-15 units) TID AC + HS      --Will follow patient during hospitalization--  Ambrose FinlandJeannine Johnston Nickalas Mccarrick RN, MSN, CDE Diabetes Coordinator Inpatient Glycemic Control Team Team Pager: [phone] (8a-5p)

## 2017-10-09 NOTE — [facility] Group Notes (Signed)
LCSW Group Therapy Note   10/09/2017 1:15pm   Type of Therapy and Topic:  Group Therapy:  Overcoming Obstacles   Participation Level:  Did Not Attend   Description of Group:    In this group patients will be encouraged to explore what they see as obstacles to their own wellness and recovery. They will be guided to discuss their thoughts, feelings, and behaviors related to these obstacles. The group will process together ways to cope with barriers, with attention given to specific choices patients can make. Each patient will be challenged to identify changes they are motivated to make in order to overcome their obstacles. This group will be process-oriented, with patients participating in exploration of their own experiences as well as giving and receiving support and challenge from other group members.   Therapeutic Goals: 1. Patient will identify personal and current obstacles as they relate to admission. 2. Patient will identify barriers that currently interfere with their wellness or overcoming obstacles.  3. Patient will identify feelings, thought process and behaviors related to these barriers. 4. Patient will identify two changes they are willing to make to overcome these obstacles:      Summary of Patient Progress      Therapeutic Modalities:   Cognitive Behavioral Therapy Solution Focused Therapy Motivational Interviewing Relapse Prevention Therapy  Ida RogueRodney B Cleatus Goodin, LCSW 10/09/2017 2:14 PM
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
## 2017-10-09 NOTE — [facility] Group Notes (Signed)
[facility] Group Notes:  (Nursing/MHT/Case Management/Adjunct)  Date:  10/09/2017  Time:  5:40 PM  Type of Therapy:  Psychoeducational Skills  Participation Level:  Did Not Attend  Participation Quality: Audrie Lializabeth O Bleu Moisan 10/09/2017, 5:40 PM

## 2017-10-09 NOTE — H&P (Signed)
Psychiatric Admission Assessment Adult  Patient Identification: Charles Orr MRN:  [id number] Date of Evaluation:  10/09/2017 Chief Complaint:  MDD RECURRENT SEVERE WITH PSYCHOTIC FEATURE Principal Diagnosis: Bipolar disorder, curr episode mixed, severe, with psychotic features (HCC) Diagnosis:   Patient Active Problem List   Diagnosis Date Noted  . Bipolar disorder, curr episode mixed, severe, with psychotic features (HCC) [F31.64] 10/08/2017  . Polysubstance (including opioids) dependence with physiological dependence (HCC) [F19.20] 10/06/2017  . 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 Chest pain [R07.9] 07/10/2017  . Angina pectoris (HCC) [I20.9] 07/09/2017  . Tobacco abuse [Z72.0] 07/09/2017  . Hypertension [I10] 07/09/2017  . Type II diabetes mellitus (HCC) [E11.9]   . GERD (gastroesophageal reflux disease) [K21.9]   . Chronic lower back pain [M54.5, G89.29]   . Poorly controlled diabetes mellitus (HCC) [E11.65]   . Hyperlipidemia with target LDL less than 70 [E78.5]   . Schizoaffective disorder, bipolar type (HCC) [F25.0] 06/16/2017  . Depression [F32.9] 06/15/2017  . Other schizophrenia (HCC) [F20.89]   . Pneumonia [J18.9] 06/11/2017  . Community acquired pneumonia of right lower lobe of lung (HCC) [J18.1]   . Hyperglycemia [R73.9]   . Localized edema [R60.0]   . Shortness of breath [R06.02] 06/10/2017   History of Present Illness:   Charles Orr is a 57 y/o M with history of bipolar disorder with psychotic features and polysubstance abuse who was admitted voluntarily with worsening depression, AH, and SI with plan to jump from a cliff. Pt had 3 ED presentations within the past week. He also reported using cocaine, opiates, and THC. Pt has recent history of discharge from [facility] in October 2018 to outpatient level of care.  Upon initial presentation, pt shares, "I didn't follow up at [facility] after I left last time." He  continues that he had been staying at the [facility] but then left and he was staying at a [facility] where he was completing painting to pay for his rent; however, the owner told him that he would not be paid, so he left on 2/1 and he has been homeless since that time. Pt shares that this stressor caused significant worsening of his mood and psychotic symptoms. He endorses SI with plan to jump from a bridge and HI towards the director of the [employer] (though he has no intent or plan). He endorses AH which command him to kill himself. He endorses VH, but he is vague in their description. He endorses mood symptoms of depressed mood, guilty feelings, low energy, poor concentration, and poor motivation. He endorses irritability, but he otherwise denies symptoms of mania. He denies symptoms of OCD and PTSD. He notes that he had been maintaining sobriety, but after stressor of losing his housing he had relapse of use of "$200 of crack" on 2/1 as well as cannabis, and heroin (insufflated). He also endorses drinking about 10-15 beers on that day. He has been smoking about 1 pack per day.  Discussed with patient about treatment options. He notes that his previous home medications were helpful, and he only stopped taking them because he ran out and did not go to follow up. He would like to restart his previous medication regimen (which has already been resumed on admission). Pt is interested in speaking with SW team about substance use treatment options. He had no further questions, comments, or concerns.   Associated Signs/Symptoms: Depression Symptoms:  depressed mood, feelings of worthlessness/guilt, difficulty concentrating, suicidal thoughts with  specific plan, anxiety, (Hypo) Manic Symptoms:  Distractibility, Impulsivity, Irritable Mood, Anxiety Symptoms:  Excessive Worry, Psychotic Symptoms:  Hallucinations: Auditory Visual Paranoia, PTSD Symptoms: NA Total Time spent with patient:  1 hour  Past Psychiatric History:  - previous dx of bipolar I with psychotic features - multiple inpatient stays with last to [facility] in Oct 2018 - No current outpatient provider - denies hx of SA  Is the patient at risk to self? Yes.    Has the patient been a risk to self in the past 6 months? Yes.    Has the patient been a risk to self within the distant past? Yes.    Is the patient a risk to others? Yes.    Has the patient been a risk to others in the past 6 months? Yes.    Has the patient been a risk to others within the distant past? Yes.     Prior Inpatient Therapy:   Prior Outpatient Therapy:    Alcohol Screening: 1. How often do you have a drink containing alcohol?: Never 2. How many drinks containing alcohol do you have on a typical day when you are drinking?: 1 or 2 3. How often do you have six or more drinks on one occasion?: Never AUDIT-C Score: 0 4. How often during the last year have you found that you were not able to stop drinking once you had started?: Never 5. How often during the last year have you failed to do what was normally expected from you becasue of drinking?: Never 6. How often during the last year have you needed a first drink in the morning to get yourself going after a heavy drinking session?: Never 7. How often during the last year have you had a feeling of guilt of remorse after drinking?: Never 8. How often during the last year have you been unable to remember what happened the night before because you had been drinking?: Never 9. Have you or someone else been injured as a result of your drinking?: No 10. Has a relative or friend or a doctor or another health worker been concerned about your drinking or suggested you cut down?: No Alcohol Use Disorder Identification Test Final Score (AUDIT): 0 Intervention/Follow-up: AUDIT Score <7 follow-up not indicated Substance Abuse History in the last 12 months:  Yes.   Consequences of Substance Abuse: Medical  Consequences:  worsening psychotic symptoms Previous Psychotropic Medications: Yes  Psychological Evaluations: Yes  Past Medical History:  Past Medical History:  Diagnosis Date  . Anxiety   . Arthritis    "back" (06/12/2017)  . Bipolar disorder (HCC)   . CAP (community acquired pneumonia) 06/10/2017  . Chronic lower back pain   . Degenerative disc disease, lumbar   . Depression   . Fibromyalgia   . GERD (gastroesophageal reflux disease)   . High cholesterol   . Hypertension   . Myocardial infarction (HCC) 2002  . Pneumonia 1989  . Schizophrenia (HCC)   . Type II diabetes mellitus (HCC)     Past Surgical History:  Procedure Laterality Date  . CARDIAC CATHETERIZATION  12/2006   [name] 01/18/2011  . INGUINAL HERNIA REPAIR Right   . LEFT HEART CATH AND CORONARY ANGIOGRAPHY N/A 07/11/2017   Procedure: LEFT HEART CATH AND CORONARY ANGIOGRAPHY;  Surgeon: Tonny Bollman, MD;  Location: [facility] INVASIVE CV LAB;  Service: Cardiovascular;  Laterality: N/A;  . PARATHYROIDECTOMY    . PERICARDIAL TAP  2000   ?; in [place]/notes 01/18/2011   Family  History:  Family History  Problem Relation Age of Onset  . Hypertension Mother   . Diabetes Mother   . Heart disease Father   . Hypertension Sister   . Diabetes Sister   . Hypertension Brother   . Diabetes Brother   . Hypertension Sister   . Diabetes Sister   . Hypertension Brother   . Diabetes Brother    Family Psychiatric  History: denies family psychiatric history Tobacco Screening: Have you used any form of tobacco in the last 30 days? (Cigarettes, Smokeless Tobacco, Cigars, and/or Pipes): Yes Tobacco use, Select all that apply: 5 or more cigarettes per day Are you interested in Tobacco Cessation Medications?: Yes, will notify MD for an order Counseled patient on smoking cessation including recognizing danger situations, developing coping skills and basic information about quitting provided: Refused/Declined practical counseling Social  History:  Social History   Substance and Sexual Activity  Alcohol Use Yes   Comment: 06/12/2017 "stopped ~ 1 yr ago"      Social History   Substance and Sexual Activity  Drug Use Yes  . Types: Cocaine   Comment: 06/12/2017 "stopped using cocaine ~ 1 yr ago; used marijuana when I was younger" last cocaine use 9 mths ago per pt on 06/15/17    Additional Social History:                           Allergies:  No Known Allergies Lab Results:  Results for orders placed or performed during the hospital encounter of 10/08/17 (from the past 48 hour(s))  Glucose, capillary     Status: Abnormal   Collection Time: 10/08/17  5:08 PM  Result Value Ref Range   Glucose-Capillary 268 (H) 65 - 99 mg/dL  Glucose, capillary     Status: Abnormal   Collection Time: 10/08/17  8:49 PM  Result Value Ref Range   Glucose-Capillary 220 (H) 65 - 99 mg/dL  Glucose, capillary     Status: Abnormal   Collection Time: 10/09/17  6:00 AM  Result Value Ref Range   Glucose-Capillary 126 (H) 65 - 99 mg/dL  Glucose, capillary     Status: Abnormal   Collection Time: 10/09/17 11:56 AM  Result Value Ref Range   Glucose-Capillary 208 (H) 65 - 99 mg/dL    Blood Alcohol level:  Lab Results  Component Value Date   ETH <10 10/07/2017   ETH <10 07/07/2017    Metabolic Disorder Labs:  Lab Results  Component Value Date   HGBA1C 10.0 (H) 07/09/2017   MPG 240.3 07/09/2017   MPG 225.95 06/10/2017   No results found for: PROLACTIN Lab Results  Component Value Date   CHOL 85 06/10/2017   TRIG 101 06/10/2017   HDL 22 (L) 06/10/2017   CHOLHDL 3.9 06/10/2017   VLDL 20 06/10/2017   LDLCALC 43 06/10/2017    Current Medications: Current Facility-Administered Medications  Medication Dose Route Frequency Provider Last Rate Last Dose  . acetaminophen (TYLENOL) tablet 650 mg  650 mg Oral Q6H PRN Fransisca Kaufmann A, NP   650 mg at 10/08/17 1524  . alum & mag hydroxide-simeth (MAALOX/MYLANTA) 200-200-20 MG/5ML  suspension 30 mL  30 mL Oral Q4H PRN Fransisca Kaufmann A, NP   30 mL at 10/09/17 0645  . atorvastatin (LIPITOR) tablet 40 mg  40 mg Oral Daily Fransisca Kaufmann A, NP   40 mg at 10/09/17 0759  . gabapentin (NEURONTIN) capsule 300 mg  300 mg Oral BID  Fransisca Kaufmann A, NP   300 mg at 10/09/17 0800  . hydrochlorothiazide (HYDRODIURIL) tablet 25 mg  25 mg Oral Daily Fransisca Kaufmann A, NP   25 mg at 10/09/17 0802  . hydrOXYzine (ATARAX/VISTARIL) tablet 25 mg  25 mg Oral TID PRN Thermon Leyland, NP   25 mg at 10/08/17 2115  . insulin aspart protamine- aspart (NOVOLOG MIX 70/30) injection 60 Units  60 Units Subcutaneous Q breakfast Thermon Leyland, NP   60 Units at 10/09/17 0981   And  . insulin aspart protamine- aspart (NOVOLOG MIX 70/30) injection 45 Units  45 Units Subcutaneous Q supper Thermon Leyland, NP   45 Units at 10/08/17 1719  . magnesium hydroxide (MILK OF MAGNESIA) suspension 30 mL  30 mL Oral Daily PRN Fransisca Kaufmann A, NP      . metoprolol tartrate (LOPRESSOR) tablet 25 mg  25 mg Oral BID Fransisca Kaufmann A, NP   25 mg at 10/09/17 0759  . nicotine (NICODERM CQ - dosed in mg/24 hours) patch 14 mg  14 mg Transdermal Daily Fransisca Kaufmann A, NP   14 mg at 10/09/17 0800  . risperiDONE (RISPERDAL) tablet 1 mg  1 mg Oral BID Fransisca Kaufmann A, NP   1 mg at 10/09/17 0800  . sertraline (ZOLOFT) tablet 50 mg  50 mg Oral Daily Fransisca Kaufmann A, NP   50 mg at 10/09/17 0759  . traZODone (DESYREL) tablet 50 mg  50 mg Oral QHS PRN Thermon Leyland, NP   50 mg at 10/08/17 2115   PTA Medications: Medications Prior to Admission  Medication Sig Dispense Refill Last Dose  . aspirin 81 MG EC tablet Take 1 tablet (81 mg total) by mouth daily. For heart health 1 tablet 0 Past Week at Unknown time  . atorvastatin (LIPITOR) 40 MG tablet Take 1 tablet (40 mg total) by mouth daily. For high Cholesterol 1 tablet 0 Past Week at Unknown time  . dicyclomine (BENTYL) 20 MG tablet Take 1 tablet (20 mg total) by mouth 2 (two) times daily. 20 tablet 0  Past Week at Unknown time  . gabapentin (NEURONTIN) 300 MG capsule Take 1 capsule (300 mg total) by mouth 2 (two) times daily. For agitation 60 capsule 0 Past Week at Unknown time  . hydrochlorothiazide (HYDRODIURIL) 25 MG tablet Take 1 tablet (25 mg total) by mouth daily. For high blood pressure 30 tablet 0 Past Week at Unknown time  . hydrOXYzine (ATARAX/VISTARIL) 25 MG tablet Take 1 tablet (25 mg total) by mouth 3 (three) times daily as needed for anxiety. 60 tablet 0 Past Week at Unknown time  . insulin aspart protamine- aspart (NOVOLOG MIX 70/30) (70-30) 100 UNIT/ML injection Inject 0.45-0.6 mLs (45-60 Units total) into the skin 2 (two) times daily with a meal. 60 units in the morning and 45 units at dinner time 10 mL 0 Past Week at Unknown time  . lidocaine (LIDODERM) 5 % Place 1 patch onto the skin daily. Remove & Discard patch within 12 hours or as directed by MD: For pain management (Patient not taking: Reported on 07/07/2017) 7 patch 0 Not Taking at Unknown time  . meclizine (ANTIVERT) 25 MG tablet Take 1 tablet (25 mg total) by mouth 3 (three) times daily as needed for dizziness. 20 tablet 0 Past Week at Unknown time  . metoprolol tartrate (LOPRESSOR) 25 MG tablet Take 1 tablet (25 mg total) by mouth 2 (two) times daily. For high blood pressure 30 tablet 0 Past  Week at Unknown time  . Multiple Vitamin (MULTIVITAMIN WITH MINERALS) TABS tablet Take 1 tablet by mouth daily. For Vitamin supplement   Past Week at Unknown time  . nicotine (NICODERM CQ - DOSED IN MG/24 HOURS) 14 mg/24hr patch Place 1 patch (14 mg total) onto the skin daily. (May purchase over the counter): For smoking cessation (Patient not taking: Reported on 07/07/2017) 28 patch 0 Not Taking at Unknown time  . nitroGLYCERIN (NITROSTAT) 0.4 MG SL tablet Place 1 tablet (0.4 mg total) every 5 (five) minutes as needed under the tongue for chest pain. 15 tablet 0 unk  . ranitidine (ZANTAC) 150 MG tablet Take 1 tablet (150 mg total) by  mouth 2 (two) times daily. 30 tablet 0 Past Week at Unknown time  . risperiDONE (RISPERDAL) 1 MG tablet Take 1 tablet (1 mg) by mouth in the mornings & 2 tablets (2 mg) at bedtime: For mood control (Patient taking differently: Take 1 mg by mouth 2 (two) times daily. ) 90 tablet 0 Past Week at Unknown time  . sertraline (ZOLOFT) 50 MG tablet Take 1 tablet (50 mg total) by mouth daily. For depression 30 tablet 0 Past Week at Unknown time    Musculoskeletal: Strength & Muscle Tone: within normal limits Gait & Station: normal Patient leans: N/A  Psychiatric Specialty Exam: Physical Exam  Nursing note and vitals reviewed.   Review of Systems  Constitutional: Negative for chills and fever.  Respiratory: Negative for cough and shortness of breath.   Gastrointestinal: Negative for abdominal pain, heartburn, nausea and vomiting.  Psychiatric/Behavioral: Positive for depression, hallucinations, substance abuse and suicidal ideas. The patient is nervous/anxious.     Blood pressure 107/73, pulse 71, temperature (!) 97.4 F (36.3 C), temperature source Oral, resp. rate 18, height 6\' 2"  (1.88 m), weight 114.3 kg (252 lb).Body mass index is 32.35 kg/m.  General Appearance: Casual and Fairly Groomed  Eye Contact:  Good  Speech:  Clear and Coherent and Normal Rate  Volume:  Normal  Mood:  Anxious and Depressed  Affect:  Appropriate, Congruent and Constricted  Thought Process:  Coherent and Goal Directed  Orientation:  Full (Time, Place, and Person)  Thought Content:  Hallucinations: Auditory Visual  Suicidal Thoughts:  Yes.  with intent/plan  Homicidal Thoughts:  Yes.  without intent/plan  Memory:  Immediate;   Fair Recent;   Fair Remote;   Fair  Judgement:  Impaired  Insight:  Lacking  Psychomotor Activity:  Normal  Concentration:  Concentration: Fair  Recall:  [employer]Fair  Fund of Knowledge:  Fair  Language:  Fair  Akathisia:  No  Handed:    AIMS (if indicated):     Assets:  Communication  Skills Desire for Improvement Intimacy Physical Health Resilience  ADL's:  Intact  Cognition:  WNL  Sleep:  Number of Hours: 6.5    Treatment Plan Summary: Daily contact with patient to assess and evaluate symptoms and progress in treatment and Medication management  Observation Level/Precautions:  15 minute checks  Laboratory:  CBC Chemistry Profile HbAIC UDS  Psychotherapy:  Encourage participation in groups/therapeutic milieu  Medications:  Restart previous outpatient medications including gabapentin 300mg  BID, zoloft 50mg  qDay, and risperdal 1mg  BID  Consultations:    Discharge Concerns:    Estimated LOS: 5-7 days  Other:     Physician Treatment Plan for Primary Diagnosis: Bipolar disorder, curr episode mixed, severe, with psychotic features (HCC) Long Term Goal(s): Improvement in symptoms so as ready for discharge  Short Term Goals:  Ability to identify and develop effective coping behaviors will improve  Physician Treatment Plan for Secondary Diagnosis: Principal Problem:   Bipolar disorder, curr episode mixed, severe, with psychotic features (HCC)  Long Term Goal(s): Improvement in symptoms so as ready for discharge  Short Term Goals: Compliance with prescribed medications will improve  I certify that inpatient services furnished can reasonably be expected to improve the patient's condition.    Micheal Likens, MD 2/5/20194:45 PM

## 2017-10-09 NOTE — [facility] Suicide Risk Assessment (Signed)
[facility]   Nursing information obtained from:    Demographic factors:    Current Mental Status:    Loss Factors:    Historical Factors:    Risk Reduction Factors:     Total Time spent with patient: 1 hour Principal Problem: Bipolar disorder, curr episode mixed, severe, with psychotic features (HCC) Diagnosis:   Patient Active Problem List   Diagnosis Date Noted  . Bipolar disorder, curr episode mixed, severe, with psychotic features (HCC) [F31.64] 10/08/2017  . Polysubstance (including opioids) dependence with physiological dependence (HCC) [F19.20] 10/06/2017  . 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 Chest pain [R07.9] 07/10/2017  . Angina pectoris (HCC) [I20.9] 07/09/2017  . Tobacco abuse [Z72.0] 07/09/2017  . Hypertension [I10] 07/09/2017  . Type II diabetes mellitus (HCC) [E11.9]   . GERD (gastroesophageal reflux disease) [K21.9]   . Chronic lower back pain [M54.5, G89.29]   . Poorly controlled diabetes mellitus (HCC) [E11.65]   . Hyperlipidemia with target LDL less than 70 [E78.5]   . Schizoaffective disorder, bipolar type (HCC) [F25.0] 06/16/2017  . Depression [F32.9] 06/15/2017  . Other schizophrenia (HCC) [F20.89]   . Pneumonia [J18.9] 06/11/2017  . Community acquired pneumonia of right lower lobe of lung (HCC) [J18.1]   . Hyperglycemia [R73.9]   . Localized edema [R60.0]   . Shortness of breath [R06.02] 06/10/2017   Subjective Data: See H&P for full HPI  Charles Orr is a 57 y/o M with history of bipolar disorder with psychotic features and polysubstance abuse who was admitted voluntarily with worsening depression, AH, and SI with plan to jump from a cliff. Pt had 3 ED presentations within the past week. He also reported using cocaine, opiates, and THC. [facility] level of care. Pt was restarted on previous outpatient  medications and he will meet with SW team to discuss referral to substance use treatment.    Continued Clinical Symptoms:  Alcohol Use Disorder Identification Test Final Score (AUDIT): 0 The "Alcohol Use Disorders Identification Test", Guidelines for Use in Primary Care, Second Edition.  World Science writerHealth Organization [facility](WHO). Score between 0-7:  no or low risk or alcohol related problems. Score between 8-15:  moderate risk of alcohol related problems. Score between 16-19:  high risk of alcohol related problems. Score 20 or above:  warrants further diagnostic evaluation for alcohol dependence and treatment.   CLINICAL FACTORS:   Bipolar Disorder:   Mixed State   Musculoskeletal: Strength & Muscle Tone: within normal limits Gait & Station: normal Patient leans: N/A  Psychiatric Specialty Exam: Physical Exam  Nursing note and vitals reviewed.   ROS - see H&P  Blood pressure 107/73, pulse 71, temperature (!) 97.4 F (36.3 C), temperature source Oral, resp. rate 18, height 6\' 2"  (1.88 m), weight 114.3 kg (252 lb).Body mass index is 32.35 kg/m.  General Appearance: Casual and Fairly Groomed  Eye Contact:  Good  Speech:  Clear and Coherent and Normal Rate  Volume:  Normal  Mood:  Anxious and Depressed  Affect:  Appropriate, Congruent and Constricted  Thought Process:  Coherent and Goal Directed  Orientation:  Full (Time, Place, and Person)  Thought Content:  Hallucinations: Auditory Visual  Suicidal Thoughts:  Yes.  with intent/plan  Homicidal Thoughts:  Yes.  without intent/plan  Memory:  Immediate;   Fair Recent;   Fair Remote;   Fair  Judgement:  Impaired  Insight:  Lacking  Psychomotor Activity:  Normal  Concentration:  Concentration: Fair  Recall:  [employer] of Knowledge:  Fair  Language:  Fair  Akathisia:  No  Handed:    AIMS (if indicated):     Assets:  Communication Skills Desire for Improvement Intimacy Physical Health Resilience  ADL's:  Intact  Cognition:   WNL  Sleep:  Number of Hours: 6.5        COGNITIVE FEATURES THAT CONTRIBUTE TO RISK:  None    SUICIDE RISK:   Minimal: No identifiable suicidal ideation.  Patients presenting with no risk factors but with morbid ruminations; may be classified as minimal risk based on the severity of the depressive symptoms  PLAN OF CARE:   - Admit to inpatient level of care  -Bipolar I, current episode mixed, with psychotic features   - Restart risperdal 1mg  po BID   - Restart zoloft 50mg  po qDay  - Anxiety  - Restart gabapentin 300mg  po BID  - Start atarax 25mg  po TID prn anxiety  - DMII  - Continue SSI  -HLD  - Continue lipitor  -HTN  - Continue HCTZ and lopressor  - Insomnia   - Continue trazodone 50mg  po qhs prn insomnia  -Encourage participation in groups and the therapeutic milieu  -Discharge planning will be ongoing  I certify that inpatient services furnished can reasonably be expected to improve the patient's condition.   Micheal Likens, MD 10/09/2017, 4:51 PM

## 2017-10-09 NOTE — Progress Notes (Signed)
Recreation Therapy Notes  INPATIENT RECREATION THERAPY ASSESSMENT  Patient Details Name: Charles PortaKarl J Mitro MRN: [id number] DOB: 1961/01/20 Today's Date: 10/09/2017       Information Obtained From: Patient  Able to Participate in Assessment/Interview: Yes  Patient Presentation: Alert  Reason for Admission (Per Patient): Other (Comments)(Anger)  Patient Stressors: Other (Comment)("Decietfulness/landlord")  Coping Skills:   Isolation, Self-Injury, TV, Arguments, Substance Abuse, Impulsivity, Avoidance, Read  Leisure Interests (2+):  Games - Cards  Frequency of Recreation/Participation: Weekly  Awareness of Community Resources:  No  Expressed Interest in State [employer] CorporationCommunity Resource Information: Yes  Patient Main Form of Transportation: Therapist, musicublic Transportation  Patient Strengths:  Takes a lot to make mad; Caring  Patient Identified Areas of Improvement:  Social life; Eating habits  Current Recreation Participation:  Weekly  Patient Goal for Hospitalization:  "To deal with anger"  [place]  [place]  Current [place] (including self-harm):  Yes(Rated a 7; Recruitment consultantContracts)  Current HI:  Yes(Rated a 10; Contracts)  Current AVH: Yes(Rated an 8)  Staff Intervention Plan: Group Attendance  Consent to Intern Participation: N/A    Caroll RancherMarjette Kaevon Cotta, LRT/CTRS  Lillia AbedLindsay, Kathryn Linarez A 10/09/2017, 1:17 PM

## 2017-10-10 DIAGNOSIS — G4700 Insomnia, unspecified: Secondary | ICD-10-CM

## 2017-10-10 DIAGNOSIS — I10 Essential (primary) hypertension: Secondary | ICD-10-CM

## 2017-10-10 DIAGNOSIS — E119 Type 2 diabetes mellitus without complications: Secondary | ICD-10-CM

## 2017-10-10 DIAGNOSIS — E785 Hyperlipidemia, unspecified: Secondary | ICD-10-CM

## 2017-10-10 LAB — GLUCOSE, CAPILLARY
GLUCOSE-CAPILLARY: 199 mg/dL — AB (ref 65–99)
Glucose-Capillary: 194 mg/dL — ABNORMAL HIGH (ref 65–99)
Glucose-Capillary: 333 mg/dL — ABNORMAL HIGH (ref 65–99)
Glucose-Capillary: 363 mg/dL — ABNORMAL HIGH (ref 65–99)

## 2017-10-10 MED ORDER — RISPERIDONE 2 MG PO TABS
2.0000 mg | ORAL_TABLET | Freq: Every day | ORAL | Status: DC
  Administered 2017-10-11 – 2017-10-17 (×7): 2 mg via ORAL
  Filled 2017-10-10 (×9): qty 1

## 2017-10-10 NOTE — Progress Notes (Signed)
Recreation Therapy Notes  Date: 10/10/17 Time: 1000 Location: 500 Hall Dayroom  Group Topic: Self-Esteem  Goal Area(s) Addresses:  Patient will successfully identify positive attributes about themselves.  Patient will successfully identify benefit of improved self-esteem.   Intervention: Worksheet, pencils   Activity: My Strengths and Qualities.  Patients were given a worksheet in which they were to come up with three answers for the following categories:  Things I'm good at, compliments I've received, what I like about my appearance, challenges I've overcome, I've helped others by, things that make me unique, what I value the most and times I've made others happy.  Education:  Self-Esteem, Discharge Planning.   Education Outcome: Acknowledges education/In group clarification offered/Needs additional education  Clinical Observations/Feedback: Pt did not attend group.   Demba Nigh, LRT/CTRS         Graciela Plato A 10/10/2017 12:02 PM 

## 2017-10-10 NOTE — Progress Notes (Deleted)
.  .  .        xx

## 2017-10-10 NOTE — Progress Notes (Signed)
Patient ID: Charles Orr, male   DOB: March 06, 1961, 57 y.o.   MRN: [id number] DAR Note: Pt observed in the dayroom not interacting. Pt continue to endorse moderate anxiety, depression and auditory hallucinations, "The didn't hear any meaningful words tonight but with was really bad during the day." Pt contracts for safety. Pt remained calm and cooperative. All patient's questions and concerns addressed. Support, encouragement, and safe environment provided. 15-minute safety checks continue.

## 2017-10-10 NOTE — Progress Notes (Signed)
Inpatient Diabetes Program Recommendations  AACE/ADA: New Consensus Statement on Inpatient Glycemic Control (2015)  Target Ranges:  Prepandial:   less than 140 mg/dL      Peak postprandial:   less than 180 mg/dL (1-2 hours)      Critically ill patients:  140 - 180 mg/dL   Results for Arlis PortaROSS, Mithran J (MRN [id number]) as of 10/10/2017 09:26  Ref. Range 10/09/2017 06:00 10/09/2017 11:56 10/09/2017 17:16 10/09/2017 20:07  Glucose-Capillary Latest Ref Range: 65 - 99 mg/dL 409126 (H) 811208 (H) 914385 (H) 289 (H)   Results for Arlis PortaROSS, Trygg J (MRN [id number]) as of 10/10/2017 09:26  Ref. Range 10/10/2017 06:15  Glucose-Capillary Latest Ref Range: 65 - 99 mg/dL 086199 (H)     Home DM Meds: 70/30 Insulin- 60 units AM/ 45 units PM  Current Insulin Orders: 70/30 Insulin- 60 units AM/ 45 units PM        MD- Please consider the following in-hospital insulin adjustments:  1. Start Novolog Moderate Correction Scale/ SSI (0-15 units) TID AC + HS  2. Increase 70/30 Insulin slightly to: 65 units AM/ 50 units PM     --Will follow patient during hospitalization--  Ambrose FinlandJeannine Johnston Adyan Palau RN, MSN, CDE Diabetes Coordinator Inpatient Glycemic Control Team Team Pager: [phone] (8a-5p)

## 2017-10-10 NOTE — [facility] Group Notes (Signed)
[facility] Group Notes:  (Nursing/MHT/Case Management/Adjunct)  Date:  10/10/2017  Time:  1600  Type of Therapy:  Nurse Education - Identifying Needs to Promote Wellness  Participation Level:  Did Not Attend  Participation Quality:    Affect:    Cognitive:    Insight:    Engagement in Group:    Modes of Intervention:    Summary of Progress/Problems: Patient invited to group however told this Clinical research associatewriter he did not wish to come. "I just need my rest."  Lawrence MarseillesFriedman, Brandy Kabat Eakes 10/10/2017, 4:52 PM

## 2017-10-10 NOTE — Plan of Care (Signed)
Patient verbalizes understanding of information, education provided. 

## 2017-10-10 NOTE — Progress Notes (Addendum)
D: Patient observed resting in bed much of the day. Patient states he is still hearing voices.Also endorsing passive SI but verbally contracts for safety. Also endorsing HI towards his landlord. Patient's affect flat, mood depressed. Per self inventory and discussions with writer, rates depression at a 7/10, hopelessness at a 7/10 and anxiety at a 7/10. Rates sleep as fair, appetite as fair, energy as low and concentration as poor.  States goal for today is "energy, focus on good thoughts." Denies pain, physical complaints.   A: Medicated per orders, no prns requested or required. Level III obs in place for safety. Emotional support offered and self inventory reviewed. Encouraged completion of Suicide Safety Plan and programming participation. Discussed POC with MD, SW. Informed MD diabetes nurse educator had evaluated chart and gave recommendations. However patient indicating he is not interested in taking coverage.   R: Patient verbalizes understanding of POC. Patient denies VH and remains safe on level III obs. Will continue to monitor closely and make verbal contact frequently.

## 2017-10-10 NOTE — Progress Notes (Signed)
[facility] MD Progress Note  10/10/2017 3:50 PM Charles Orr  MRN:  [id number] Subjective:    Charles Orr is a 57 y/o M with history of bipolar disorder with psychotic features and polysubstance abuse who was admitted voluntarily with worsening depression, AH, and SI with plan to jump from a cliff. Pt had 3 ED presentations within the past week. He also reported using cocaine, opiates, and THC. Pt has recent history of discharge from [facility] in October 2018 to outpatient level of care. Pt was restarted on previous outpatient medications of gabapentin, zoloft, and risperdal.   Today upon evaluation, pt shares, "My medicine has got me a little woozy this morning, but my mood is a little better." Pt shares that he was able to sleep well. His appetite is good. He endorses ongoing AH which are improving and he describes them, stating, "It isn't as fast as it normally is." He denies VH. He has some SI without specific plan today and he still has some HI directed towards his previous landlord, but he reports those feelings are diminishing and he does not feel he would take action to hurt someone else currently. Pt notes his medications have been helpful so far, and we discussed possibly changing his risperdal to be given all at bedtime, as to reduce daytime fatigue, and pt was in agreement. He is still interested in substance use treatment at [facility] and he will work with SW team about securing a position there in the coming days. Pt had no further questions, comments, or concerns.   Principal Problem: Bipolar disorder, curr episode mixed, severe, with psychotic features (HCC) Diagnosis:   Patient Active Problem List   Diagnosis Date Noted  . Bipolar disorder, curr episode mixed, severe, with psychotic features (HCC) [F31.64] 10/08/2017  . Polysubstance (including opioids) dependence with physiological dependence (HCC) [F19.20] 10/06/2017  . 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 Chest pain [R07.9] 07/10/2017  . Angina pectoris (HCC) [I20.9] 07/09/2017  . Tobacco abuse [Z72.0] 07/09/2017  . Hypertension [I10] 07/09/2017  . Type II diabetes mellitus (HCC) [E11.9]   . GERD (gastroesophageal reflux disease) [K21.9]   . Chronic lower back pain [M54.5, G89.29]   . Poorly controlled diabetes mellitus (HCC) [E11.65]   . Hyperlipidemia with target LDL less than 70 [E78.5]   . Schizoaffective disorder, bipolar type (HCC) [F25.0] 06/16/2017  . Depression [F32.9] 06/15/2017  . Other schizophrenia (HCC) [F20.89]   . Pneumonia [J18.9] 06/11/2017  . Community acquired pneumonia of right lower lobe of lung (HCC) [J18.1]   . Hyperglycemia [R73.9]   . Localized edema [R60.0]   . Shortness of breath [R06.02] 06/10/2017   Total Time spent with patient: 30 minutes  Past Psychiatric History: see H&P  Past Medical History:  Past Medical History:  Diagnosis Date  . Anxiety   . Arthritis    "back" (06/12/2017)  . Bipolar disorder (HCC)   . CAP (community acquired pneumonia) 06/10/2017  . Chronic lower back pain   . Degenerative disc disease, lumbar   . Depression   . Fibromyalgia   . GERD (gastroesophageal reflux disease)   . High cholesterol   . Hypertension   . Myocardial infarction (HCC) 2002  . Pneumonia 1989  . Schizophrenia (HCC)   . Type II diabetes mellitus (HCC)     Past Surgical History:  Procedure Laterality Date  . CARDIAC CATHETERIZATION  12/2006   [name] 01/18/2011  . INGUINAL HERNIA REPAIR Right   .  LEFT HEART CATH AND CORONARY ANGIOGRAPHY N/A 07/11/2017   Procedure: LEFT HEART CATH AND CORONARY ANGIOGRAPHY;  Surgeon: Tonny Bollman, MD;  Location: [facility] INVASIVE CV LAB;  Service: Cardiovascular;  Laterality: N/A;  . PARATHYROIDECTOMY    . PERICARDIAL TAP  2000   ?; in [place]/notes 01/18/2011   Family History:  Family History  Problem Relation Age of Onset  . Hypertension Mother   . Diabetes Mother   . Heart disease Father    . Hypertension Sister   . Diabetes Sister   . Hypertension Brother   . Diabetes Brother   . Hypertension Sister   . Diabetes Sister   . Hypertension Brother   . Diabetes Brother    Family Psychiatric  History: see H&P Social History:  Social History   Substance and Sexual Activity  Alcohol Use Yes   Comment: 06/12/2017 "stopped ~ 1 yr ago"      Social History   Substance and Sexual Activity  Drug Use Yes  . Types: Cocaine   Comment: 06/12/2017 "stopped using cocaine ~ 1 yr ago; used marijuana when I was younger" last cocaine use 9 mths ago per pt on 06/15/17    Social History   Socioeconomic History  . Marital status: Divorced    Spouse name: None  . Number of children: None  . Years of education: None  . Highest education level: None  Social Needs  . Financial resource strain: None  . Food insecurity - worry: None  . Food insecurity - inability: None  . Transportation needs - medical: None  . Transportation needs - non-medical: None  Occupational History  . None  Tobacco Use  . Smoking status: Current Every Day Smoker    Packs/day: 0.50    Years: 38.00    Pack years: 19.00    Types: Cigarettes  . Smokeless tobacco: Current User    Types: Chew  Substance and Sexual Activity  . Alcohol use: Yes    Comment: 06/12/2017 "stopped ~ 1 yr ago"   . Drug use: Yes    Types: Cocaine    Comment: 06/12/2017 "stopped using cocaine ~ 1 yr ago; used marijuana when I was younger" last cocaine use 9 mths ago per pt on 06/15/17  . Sexual activity: Not Currently  Other Topics Concern  . None  Social History Narrative  . None   Additional Social History:                         Sleep: Good  Appetite:  Fair  Current Medications: Current Facility-Administered Medications  Medication Dose Route Frequency Provider Last Rate Last Dose  . acetaminophen (TYLENOL) tablet 650 mg  650 mg Oral Q6H PRN Fransisca Kaufmann A, NP   650 mg at 10/08/17 1524  . alum & mag  hydroxide-simeth (MAALOX/MYLANTA) 200-200-20 MG/5ML suspension 30 mL  30 mL Oral Q4H PRN Fransisca Kaufmann A, NP   30 mL at 10/09/17 0645  . atorvastatin (LIPITOR) tablet 40 mg  40 mg Oral Daily Fransisca Kaufmann A, NP   40 mg at 10/10/17 0800  . gabapentin (NEURONTIN) capsule 300 mg  300 mg Oral BID Fransisca Kaufmann A, NP   300 mg at 10/10/17 0800  . hydrochlorothiazide (HYDRODIURIL) tablet 25 mg  25 mg Oral Daily Fransisca Kaufmann A, NP   25 mg at 10/10/17 0759  . hydrOXYzine (ATARAX/VISTARIL) tablet 25 mg  25 mg Oral TID PRN Thermon Leyland, NP   25 mg at  10/09/17 2103  . insulin aspart protamine- aspart (NOVOLOG MIX 70/30) injection 60 Units  60 Units Subcutaneous Q breakfast Fransisca Kaufmann A, NP   60 Units at 10/10/17 0805   And  . insulin aspart protamine- aspart (NOVOLOG MIX 70/30) injection 45 Units  45 Units Subcutaneous Q supper Thermon Leyland, NP   45 Units at 10/09/17 1722  . magnesium hydroxide (MILK OF MAGNESIA) suspension 30 mL  30 mL Oral Daily PRN Fransisca Kaufmann A, NP      . metoprolol tartrate (LOPRESSOR) tablet 25 mg  25 mg Oral BID Fransisca Kaufmann A, NP   25 mg at 10/10/17 0759  . nicotine (NICODERM CQ - dosed in mg/24 hours) patch 14 mg  14 mg Transdermal Daily Fransisca Kaufmann A, NP   14 mg at 10/10/17 0800  . [START ON 10/11/2017] risperiDONE (RISPERDAL) tablet 2 mg  2 mg Oral QHS Jolyne Loa T, MD      . sertraline (ZOLOFT) tablet 50 mg  50 mg Oral Daily Fransisca Kaufmann A, NP   50 mg at 10/10/17 0800  . traZODone (DESYREL) tablet 50 mg  50 mg Oral QHS PRN Thermon Leyland, NP   50 mg at 10/09/17 2103    Lab Results:  Results for orders placed or performed during the hospital encounter of 10/08/17 (from the past 48 hour(s))  Glucose, capillary     Status: Abnormal   Collection Time: 10/08/17  5:08 PM  Result Value Ref Range   Glucose-Capillary 268 (H) 65 - 99 mg/dL  Glucose, capillary     Status: Abnormal   Collection Time: 10/08/17  8:49 PM  Result Value Ref Range   Glucose-Capillary 220 (H) 65  - 99 mg/dL  Glucose, capillary     Status: Abnormal   Collection Time: 10/09/17  6:00 AM  Result Value Ref Range   Glucose-Capillary 126 (H) 65 - 99 mg/dL  Glucose, capillary     Status: Abnormal   Collection Time: 10/09/17 11:56 AM  Result Value Ref Range   Glucose-Capillary 208 (H) 65 - 99 mg/dL  Glucose, capillary     Status: Abnormal   Collection Time: 10/09/17  5:16 PM  Result Value Ref Range   Glucose-Capillary 385 (H) 65 - 99 mg/dL  Glucose, capillary     Status: Abnormal   Collection Time: 10/09/17  8:07 PM  Result Value Ref Range   Glucose-Capillary 289 (H) 65 - 99 mg/dL  Glucose, capillary     Status: Abnormal   Collection Time: 10/10/17  6:15 AM  Result Value Ref Range   Glucose-Capillary 199 (H) 65 - 99 mg/dL  Glucose, capillary     Status: Abnormal   Collection Time: 10/10/17 12:04 PM  Result Value Ref Range   Glucose-Capillary 194 (H) 65 - 99 mg/dL    Blood Alcohol level:  Lab Results  Component Value Date   ETH <10 10/07/2017   ETH <10 07/07/2017    Metabolic Disorder Labs: Lab Results  Component Value Date   HGBA1C 10.0 (H) 07/09/2017   MPG 240.3 07/09/2017   MPG 225.95 06/10/2017   No results found for: PROLACTIN Lab Results  Component Value Date   CHOL 85 06/10/2017   TRIG 101 06/10/2017   HDL 22 (L) 06/10/2017   CHOLHDL 3.9 06/10/2017   VLDL 20 06/10/2017   LDLCALC 43 06/10/2017    Physical Findings: AIMS: Facial and Oral Movements Muscles of Facial Expression: None, normal Lips and Perioral Area: None, normal Jaw: None, normal  Tongue: None, normal,Extremity Movements Upper (arms, wrists, hands, fingers): None, normal Lower (legs, knees, ankles, toes): None, normal, Trunk Movements Neck, shoulders, hips: None, normal, Overall Severity Severity of abnormal movements (highest score from questions above): None, normal Incapacitation due to abnormal movements: None, normal Patient's awareness of abnormal movements (rate only patient's  report): No Awareness, Dental Status Current problems with teeth and/or dentures?: No Does patient usually wear dentures?: No  CIWA:    COWS:     Musculoskeletal: Strength & Muscle Tone: within normal limits Gait & Station: normal Patient leans: N/A  Psychiatric Specialty Exam: Physical Exam  Nursing note and vitals reviewed.   Review of Systems  Constitutional: Negative for chills and fever.  Respiratory: Negative for cough and shortness of breath.   Cardiovascular: Negative for chest pain.  Gastrointestinal: Negative for abdominal pain, diarrhea, heartburn and nausea.  Psychiatric/Behavioral: Positive for hallucinations and suicidal ideas. Negative for depression. The patient is nervous/anxious.     Blood pressure 109/60, pulse 93, temperature 98 F (36.7 C), temperature source Oral, resp. rate 18, height 6\' 2"  (1.88 m), weight 114.3 kg (252 lb).Body mass index is 32.35 kg/m.  General Appearance: Casual and Fairly Groomed  Eye Contact:  Good  Speech:  Clear and Coherent and Normal Rate  Volume:  Normal  Mood:  Anxious and Depressed  Affect:  Appropriate, Congruent and Constricted  Thought Process:  Coherent and Goal Directed  Orientation:  Full (Time, Place, and Person)  Thought Content:  Hallucinations: Auditory  Suicidal Thoughts:  Yes.  without intent/plan  Homicidal Thoughts:  Yes.  without intent/plan  Memory:  Immediate;   Fair Recent;   Fair Remote;   Fair  Judgement:  Fair  Insight:  Fair  Psychomotor Activity:  Normal  Concentration:  Concentration: Fair  Recall:  FiservFair  Fund of Knowledge:  Fair  Language:  Fair  Akathisia:  No  Handed:    AIMS (if indicated):     Assets:  Communication Skills Leisure Time Physical Health Resilience Social Support  ADL's:  Intact  Cognition:  WNL  Sleep:  Number of Hours: 6.75     Treatment Plan Summary: Daily contact with patient to assess and evaluate symptoms and progress in treatment and Medication  management. Pt reports improvement of his mood, AH, SI, and HI since being restarted on medications. He notes some daytime drowsiness and he agrees to move risperdal to all at bedtime. [facility] with [facility]   - Continue inpatient hospitalization  -Bipolar I, current episode mixed, with psychotic features             - Change risperdal 1mg  po BID to risperdal 2mg  po qhs             - Continue zoloft 50mg  po qDay  - Anxiety             - Continue gabapentin 300mg  po BID             - Continue atarax 25mg  po TID prn anxiety  - DMII             - Continue Novolog  -HLD             - Continue lipitor  -HTN             - Continue HCTZ and lopressor  - Insomnia             - Continue trazodone 50mg  po qhs prn  insomnia  -Encourage participation in groups and the therapeutic milieu  -Discharge planning will be ongoing   Micheal Likens, MD 10/10/2017, 3:50 PM

## 2017-10-10 NOTE — Progress Notes (Signed)
Adult Psychoeducational Group Note  Date:  10/10/2017 Time:  11:36 PM  Group Topic/Focus:  Wrap-Up Group:   The focus of this group is to help patients review their daily goal of treatment and discuss progress on daily workbooks.  Participation Level:  Active  Participation Quality:  Appropriate and Attentive  Affect:  Appropriate  Cognitive:  Alert, Appropriate and Oriented  Insight: Good  Engagement in Group:  Engaged  Modes of Intervention:  Discussion  Additional Comments:  Pt stated his goal for the day was to work on "his energy". Pt stated his accomplished his goal. Pt rated his day a 7/10 and plans on focusing on the good things tomorrow.  Leo GrosserMegan A Nyelli Samara 10/10/2017, 11:36 PM

## 2017-10-10 NOTE — [facility] Group Notes (Signed)
LCSW Group Therapy Note   10/10/2017 1:15pm   Type of Therapy and Topic:  Group Therapy:  Trust and Honesty  Participation Level:  Minimal  Description of Group:    In this group patients will be asked to explore the value of being honest.  Patients will be guided to discuss their thoughts, feelings, and behaviors related to honesty and trusting in others. Patients will process together how trust and honesty relate to forming relationships with peers, family members, and self. Each patient will be challenged to identify and express feelings of being vulnerable. Patients will discuss reasons why people are dishonest and identify alternative outcomes if one was truthful (to self or others). This group will be process-oriented, with patients participating in exploration of their own experiences, giving and receiving support, and processing challenge from other group members.   Therapeutic Goals: 1. Patient will identify why honesty is important to relationships and how honesty overall affects relationships.  2. Patient will identify a situation where they lied or were lied too and the  feelings, thought process, and behaviors surrounding the situation 3. Patient will identify the meaning of being vulnerable, how that feels, and how that correlates to being honest with self and others. 4. Patient will identify situations where they could have told the truth, but instead lied and explain reasons of dishonesty.   Summary of Patient Progress  Stayed the entire time, engaged throughout.  "Honesty is the truth.  There was a time that I told someone that I liked them even though I really didn't."  Therapeutic Modalities:   Cognitive Behavioral Therapy Solution Focused Therapy Motivational Interviewing Brief Therapy  Charles RogueRodney B Harper [name], LCSW 10/10/2017 1:25 PM

## 2017-10-10 NOTE — Tx Team (Signed)
Interdisciplinary Treatment and Diagnostic Plan Update  10/10/2017 Time of Session: 2:04 PM  Arlis PortaKarl J Egler MRN: [id number]  Principal Diagnosis: Bipolar disorder, curr episode mixed, severe, with psychotic features (HCC)  Secondary Diagnoses: Principal Problem:   Bipolar disorder, curr episode mixed, severe, with psychotic features (HCC)   Current Medications:  Current Facility-Administered Medications  Medication Dose Route Frequency Provider Last Rate Last Dose  . acetaminophen (TYLENOL) tablet 650 mg  650 mg Oral Q6H PRN [name], [name], NP   650 mg at 10/08/17 1524  . alum & mag hydroxide-simeth (MAALOX/MYLANTA) 200-200-20 MG/5ML suspension 30 mL  30 mL Oral Q4H PRN [name], [name], NP   30 mL at 10/09/17 0645  . atorvastatin (LIPITOR) tablet 40 mg  40 mg Oral Daily [name], [name], NP   40 mg at 10/10/17 0800  . gabapentin (NEURONTIN) capsule 300 mg  300 mg Oral BID [name], [name], NP   300 mg at 10/10/17 0800  . hydrochlorothiazide (HYDRODIURIL) tablet 25 mg  25 mg Oral Daily [name], [name], NP   25 mg at 10/10/17 0759  . hydrOXYzine (ATARAX/VISTARIL) tablet 25 mg  25 mg Oral TID PRN [name], [name], NP   25 mg at 10/09/17 2103  . insulin aspart protamine- aspart (NOVOLOG MIX 70/30) injection 60 Units  60 Units Subcutaneous Q breakfast [name], [name], NP   60 Units at 10/10/17 0805   And  . insulin aspart protamine- aspart (NOVOLOG MIX 70/30) injection 45 Units  45 Units Subcutaneous Q supper [name], [name], NP   45 Units at 10/09/17 1722  . magnesium hydroxide (MILK OF MAGNESIA) suspension 30 mL  30 mL Oral Daily PRN [name], [name], NP      . metoprolol tartrate (LOPRESSOR) tablet 25 mg  25 mg Oral BID [name], [name], NP   25 mg at 10/10/17 0759  . nicotine (NICODERM CQ - dosed in mg/24 hours) patch 14 mg  14 mg Transdermal Daily [name], [name], NP   14 mg at 10/10/17 0800  . [START ON 10/11/2017] risperiDONE (RISPERDAL) tablet 2 mg  2 mg Oral QHS [name], Christopher T, MD      .  sertraline (ZOLOFT) tablet 50 mg  50 mg Oral Daily [name], [name], NP   50 mg at 10/10/17 0800  . traZODone (DESYREL) tablet 50 mg  50 mg Oral QHS PRN [name], [name], NP   50 mg at 10/09/17 2103    PTA Medications: Medications Prior to Admission  Medication Sig Dispense Refill Last Dose  . aspirin 81 MG EC tablet Take 1 tablet (81 mg total) by mouth daily. For heart health 1 tablet 0 Past Week at Unknown time  . atorvastatin (LIPITOR) 40 MG tablet Take 1 tablet (40 mg total) by mouth daily. For high Cholesterol 1 tablet 0 Past Week at Unknown time  . dicyclomine (BENTYL) 20 MG tablet Take 1 tablet (20 mg total) by mouth 2 (two) times daily. 20 tablet 0 Past Week at Unknown time  . gabapentin (NEURONTIN) 300 MG capsule Take 1 capsule (300 mg total) by mouth 2 (two) times daily. For agitation 60 capsule 0 Past Week at Unknown time  . hydrochlorothiazide (HYDRODIURIL) 25 MG tablet Take 1 tablet (25 mg total) by mouth daily. For high blood pressure 30 tablet 0 Past Week at Unknown time  . hydrOXYzine (ATARAX/VISTARIL) 25 MG tablet Take 1 tablet (25 mg total) by mouth 3 (three) times daily as needed for anxiety. 60  tablet 0 Past Week at Unknown time  . insulin aspart protamine- aspart (NOVOLOG MIX 70/30) (70-30) 100 UNIT/ML injection Inject 0.45-0.6 mLs (45-60 Units total) into the skin 2 (two) times daily with a meal. 60 units in the morning and 45 units at dinner time 10 mL 0 Past Week at Unknown time  . lidocaine (LIDODERM) 5 % Place 1 patch onto the skin daily. Remove & Discard patch within 12 hours or as directed by MD: For pain management (Patient not taking: Reported on 07/07/2017) 7 patch 0 Not Taking at Unknown time  . meclizine (ANTIVERT) 25 MG tablet Take 1 tablet (25 mg total) by mouth 3 (three) times daily as needed for dizziness. 20 tablet 0 Past Week at Unknown time  . metoprolol tartrate (LOPRESSOR) 25 MG tablet Take 1 tablet (25 mg total) by mouth 2 (two) times daily. For high blood  pressure 30 tablet 0 Past Week at Unknown time  . Multiple Vitamin (MULTIVITAMIN WITH MINERALS) TABS tablet Take 1 tablet by mouth daily. For Vitamin supplement   Past Week at Unknown time  . nicotine (NICODERM CQ - DOSED IN MG/24 HOURS) 14 mg/24hr patch Place 1 patch (14 mg total) onto the skin daily. (May purchase over the counter): For smoking cessation (Patient not taking: Reported on 07/07/2017) 28 patch 0 Not Taking at Unknown time  . nitroGLYCERIN (NITROSTAT) 0.4 MG SL tablet Place 1 tablet (0.4 mg total) every 5 (five) minutes as needed under the tongue for chest pain. 15 tablet 0 unk  . ranitidine (ZANTAC) 150 MG tablet Take 1 tablet (150 mg total) by mouth 2 (two) times daily. 30 tablet 0 Past Week at Unknown time  . risperiDONE (RISPERDAL) 1 MG tablet Take 1 tablet (1 mg) by mouth in the mornings & 2 tablets (2 mg) at bedtime: For mood control (Patient taking differently: Take 1 mg by mouth 2 (two) times daily. ) 90 tablet 0 Past Week at Unknown time  . sertraline (ZOLOFT) 50 MG tablet Take 1 tablet (50 mg total) by mouth daily. For depression 30 tablet 0 Past Week at Unknown time    Patient Stressors: Financial difficulties Health problems Medication change or noncompliance Substance abuse  Patient Strengths: Ability for insight Average or above average intelligence Capable of independent living General fund of knowledge  Treatment Modalities: Medication Management, Group therapy, Case management,  1 to 1 session with clinician, Psychoeducation, Recreational therapy.   Physician Treatment Plan for Primary Diagnosis: Bipolar disorder, curr episode mixed, severe, with psychotic features (HCC) Long Term Goal(s): Improvement in symptoms so as ready for discharge  Short Term Goals: Ability to identify and develop effective coping behaviors will improve Compliance with prescribed medications will improve  Medication Management: Evaluate patient's response, side effects, and  tolerance of medication regimen.  Therapeutic Interventions: 1 to 1 sessions, Unit Group sessions and Medication administration.  Evaluation of Outcomes: Progressing  Physician Treatment Plan for Secondary Diagnosis: Principal Problem:   Bipolar disorder, curr episode mixed, severe, with psychotic features (HCC)   Long Term Goal(s): Improvement in symptoms so as ready for discharge  Short Term Goals: Ability to identify and develop effective coping behaviors will improve Compliance with prescribed medications will improve  Medication Management: Evaluate patient's response, side effects, and tolerance of medication regimen.  Therapeutic Interventions: 1 to 1 sessions, Unit Group sessions and Medication administration.  Evaluation of Outcomes: Progressing   RN Treatment Plan for Primary Diagnosis: Bipolar disorder, curr episode mixed, severe, with psychotic features (HCC) Long  Term Goal(s): Knowledge of disease and therapeutic regimen to maintain health will improve  Short Term Goals: Ability to identify and develop effective coping behaviors will improve and Compliance with prescribed medications will improve  Medication Management: RN will administer medications as ordered by provider, will assess and evaluate patient's response and provide education to patient for prescribed medication. RN will report any adverse and/or side effects to prescribing provider.  Therapeutic Interventions: 1 on 1 counseling sessions, Psychoeducation, Medication administration, Evaluate responses to treatment, Monitor vital signs and CBGs as ordered, Perform/monitor CIWA, COWS, AIMS and Fall Risk screenings as ordered, Perform wound care treatments as ordered.  Evaluation of Outcomes: Progressing   LCSW Treatment Plan for Primary Diagnosis: Bipolar disorder, curr episode mixed, severe, with psychotic features (HCC) Long Term Goal(s): Safe transition to appropriate next level of care at discharge,  Engage patient in therapeutic group addressing interpersonal concerns.  Short Term Goals: Engage patient in aftercare planning with referrals and resources  Therapeutic Interventions: Assess for all discharge needs, 1 to 1 time with Social worker, Explore available resources and support systems, Assess for adequacy in community support network, Educate family and significant other(s) on suicide prevention, Complete Psychosocial Assessment, Interpersonal group therapy.  Evaluation of Outcomes: Progressing  States he needs help with identifying a place to stay-has not been going to an outpatient clinic   Progress in Treatment: Attending groups: Yes Participating in groups: Yes Taking medication as prescribed: Yes Toleration medication: Yes, no side effects reported at this time Family/Significant other contact made: No Patient understands diagnosis: Yes AEB asking for help with SI, HI, AH, depression Discussing patient identified problems/goals with staff: Yes Medical problems stabilized or resolved: Yes Denies suicidal/homicidal ideation: Yes Issues/concerns per patient self-inventory: None Other: N/A  New problem(s) identified: None identified at this time.   New Short Term/Long Term Goal(s): "I want to get back on my meds that help."  Discharge Plan or Barriers:   Reason for Continuation of Hospitalization: Depression Hallucinations Homicidal ideation  Medication stabilization Suicidal ideation   Estimated Length of Stay: 2/11  Attendees: Patient: Charles Orr 10/10/2017  2:04 PM  Physician: Jolyne Loa, MD 10/10/2017  2:04 PM  Nursing: Mikki Harbor, RN 10/10/2017  2:04 PM  RN Care Manager: Onnie Boer, RN 10/10/2017  2:04 PM  Social Worker: Richelle Ito 10/10/2017  2:04 PM  Recreational Therapist: Aggie Cosier 10/10/2017  2:04 PM  Other: Tomasita Morrow 10/10/2017  2:04 PM  Other:  10/10/2017  2:04 PM    Scribe for Treatment Team:  Daryel Gerald LCSW 10/10/2017 2:04 PM

## 2017-10-11 LAB — GLUCOSE, CAPILLARY
GLUCOSE-CAPILLARY: 143 mg/dL — AB (ref 65–99)
GLUCOSE-CAPILLARY: 200 mg/dL — AB (ref 65–99)
GLUCOSE-CAPILLARY: 329 mg/dL — AB (ref 65–99)
Glucose-Capillary: 322 mg/dL — ABNORMAL HIGH (ref 65–99)

## 2017-10-11 MED ORDER — GABAPENTIN 300 MG PO CAPS
300.0000 mg | ORAL_CAPSULE | Freq: Three times a day (TID) | ORAL | Status: DC
  Administered 2017-10-11 – 2017-10-18 (×22): 300 mg via ORAL
  Filled 2017-10-11 (×28): qty 1

## 2017-10-11 MED ORDER — IBUPROFEN 600 MG PO TABS
600.0000 mg | ORAL_TABLET | Freq: Four times a day (QID) | ORAL | Status: DC | PRN
  Administered 2017-10-11 – 2017-10-18 (×12): 600 mg via ORAL
  Filled 2017-10-11 (×12): qty 1

## 2017-10-11 NOTE — [facility] Group Notes (Signed)
[facility] LCSW Group Therapy 10/11/2017 1:15pm  Type of Therapy: Group Therapy- Feelings Around Discharge & Establishing a Supportive Framework  Participation Level:  Active  Description of Group:   What is a supportive framework? What does it look like feel like and how do I discern it from and unhealthy non-supportive network? Learn how to cope when supports are not helpful and don't support you. Discuss what to do when your family/friends are not supportive.  When asked what is one thing che would like to say aboutalobHe knows when he has found true love because it comes from someone who will never leave and will accept him.  Summary of Patient Progress  Royal HawthornKarl attended group and remained there the entire time/When asked what gives him strength he replied that having motivation gives him strength.   He hopes that things will work out for the best.    During group he appeared to be optimistic about the future and believes that things can only get better from here.    Therapeutic Modalities:   Cognitive Behavioral Therapy Person-Centered Therapy Motivational Interviewing   Aram Beechamngel M Arietta Eisenstein, Student-Social Work 10/11/2017 10:49 AM

## 2017-10-11 NOTE — Progress Notes (Signed)
Recreation Therapy Notes  Date: 10/11/17 Time: 1000 Location: 500 Hall Dayroom  Group Topic: Wellness  Goal Area(s) Addresses:  Patient will define components of whole wellness. Patient will verbalize benefit of whole wellness.  Intervention:  None  Activity: Chair Exercises.  LRT will lead patients through a series of chair exercises and stretches.  Education: Wellness, Discharge Planning.   Education Outcome: Acknowledges education/In group clarification offered/Needs additional education.   Clinical Observations/Feedback: Pt did not attend group.    Charles Orr, LRT/CTRS         [name] A 10/11/2017 12:32 PM 

## 2017-10-11 NOTE — Progress Notes (Signed)
Nursing Progress Note 1900-0730  D) Patient presents with depressed mood but is pleasant and cooperative this evening. Patient reports [facility] is passive SI [facility] Patient reports auditory hallucinations. Patient denies thoughts of HI this evening. Patient contracts for safety on the unit. Patient denies pain or concerns for writer this evening. Patient CBG elevated; patient asymptomatic this evening. Patient educated about diet choices and encouraged to manage sugar intake. Patient compliant with medications.  A) Patient educated about and provided medication as scheduled or requested per provider's orders. Patient safety maintained with q15 min safety checks. Low fall risk precautions in place. Emotional support given. 1:1 interaction and active listening provided. Snacks and fluids provided. Labs, vital signs and patient behavior monitored throughout shift. Patient encouraged to work on treatment plan.  R) Patient remains safe on the unit at this time. Patient agrees to make needs known to staff. Will continue to monitor and assess for changes.

## 2017-10-11 NOTE — [facility] Group Notes (Signed)
[facility] Group Notes:  (Nursing/MHT/Case Management/Adjunct)  Date:  10/11/2017  Time:  4:58 PM  Type of Therapy:  Psychoeducational Skills  Participation Level:  Did Not Attend   Germaine Shenker O Loney Peto 10/11/2017, 4:58 PM 

## 2017-10-11 NOTE — [facility] Counselor (Signed)
Pt had expressed [facility] in [facility].  [place] [facility] [place] [facility] [place]. [facility] and local shelters.  He is weighing his options.

## 2017-10-11 NOTE — Progress Notes (Signed)
Nursing Progress Note 1900-0730  D) Patient presents with flat affect and depressed mood. Patient did attend group. Patient is isolative to room. Patient reports he is passive SI [facility]. Patient states he still feels HI to his landlord but has no plan. Patient reports auditory hallucinations. Patient contracts for safety on the unit. Patient denies pain or concerns for writer this evening. Patient CBG elevated; patient asymptomatic this evening. Patient educated about diet choices and encouraged to manage sugar intake. Patient compliant with medications.  A) Patient educated about and provided medication as scheduled or requested per provider's orders. Patient safety maintained with q15 min safety checks. Low fall risk precautions in place. Emotional support given. 1:1 interaction and active listening provided. Snacks and fluids provided. Labs, vital signs and patient behavior monitored throughout shift. Patient encouraged to work on treatment plan.  R) Patient remains safe on the unit at this time. Patient agrees to make needs known to staff. Will continue to monitor and assess for changes.

## 2017-10-11 NOTE — Plan of Care (Signed)
Patient presents with flat affect and depressed mood.  Reports suicidal thoughts, auditory and visual hallucinations.  Verbally contracts for safety.  Compliant with treatment plan of care.  Attended group and participated.  Offered support and encouragement as needed.  Routine safety checks maintained Q 15 minutes.  Patient is safe on the unit.

## 2017-10-11 NOTE — Progress Notes (Signed)
[facility] MD Progress Note  10/11/2017 1:57 PM YAROSLAV GOMBOS  MRN:  [id number] Subjective:    Charles Orr is a 57 y/o M with history of bipolar disorder with psychotic features and polysubstance abuse who was admitted voluntarily with worsening depression, AH, and SI with plan to jump from a cliff. Pt had 3 ED presentations within the past week. He also reported using cocaine, opiates, and THC. Pt has recent history of discharge from [facility] in October 2018 to outpatient level of care.Pt was restarted on previous outpatient medications of gabapentin, zoloft, and risperdal, and he has been reporting gradual improvement of his presenting symptoms.  Today upon evaluation, pt shares, "I'm here today." He continues to endorse depression and he reports that his mood was worsened when he found out that he would not be able to attend [facility] for substance use treatment. Pt reports worsened SI as well, stating, "I feel that way especially after being turned down again." Pt continues to endorse HI towards his previous landlord, but he acknowledges that he has no intent and that enacting his thoughts "would be a bad idea for me legally and other ways." He reports some ongoing AH, but he notes that they have improved overall during his admission. He denies VH. Pt plans to continue to work with SW team about arranging for an appropriate plan for after discharge. He is in agreement to continue his current treatment plan without changes.  Principal Problem: Bipolar disorder, curr episode mixed, severe, with psychotic features (HCC) Diagnosis:   Patient Active Problem List   Diagnosis Date Noted  . Bipolar disorder, curr episode mixed, severe, with psychotic features (HCC) [F31.64] 10/08/2017  . Polysubstance (including opioids) dependence with physiological dependence (HCC) [F19.20] 10/06/2017  . 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 Chest pain [R07.9] 07/10/2017   . Angina pectoris (HCC) [I20.9] 07/09/2017  . Tobacco abuse [Z72.0] 07/09/2017  . Hypertension [I10] 07/09/2017  . Type II diabetes mellitus (HCC) [E11.9]   . GERD (gastroesophageal reflux disease) [K21.9]   . Chronic lower back pain [M54.5, G89.29]   . Poorly controlled diabetes mellitus (HCC) [E11.65]   . Hyperlipidemia with target LDL less than 70 [E78.5]   . Schizoaffective disorder, bipolar type (HCC) [F25.0] 06/16/2017  . Depression [F32.9] 06/15/2017  . Other schizophrenia (HCC) [F20.89]   . Pneumonia [J18.9] 06/11/2017  . Community acquired pneumonia of right lower lobe of lung (HCC) [J18.1]   . Hyperglycemia [R73.9]   . Localized edema [R60.0]   . Shortness of breath [R06.02] 06/10/2017   Total Time spent with patient: 30 minutes  Past Psychiatric History: see H&P  Past Medical History:  Past Medical History:  Diagnosis Date  . Anxiety   . Arthritis    "back" (06/12/2017)  . Bipolar disorder (HCC)   . CAP (community acquired pneumonia) 06/10/2017  . Chronic lower back pain   . Degenerative disc disease, lumbar   . Depression   . Fibromyalgia   . GERD (gastroesophageal reflux disease)   . High cholesterol   . Hypertension   . Myocardial infarction (HCC) 2002  . Pneumonia 1989  . Schizophrenia (HCC)   . Type II diabetes mellitus (HCC)     Past Surgical History:  Procedure Laterality Date  . CARDIAC CATHETERIZATION  12/2006   [name] 01/18/2011  . INGUINAL HERNIA REPAIR Right   . LEFT HEART CATH AND CORONARY ANGIOGRAPHY N/A 07/11/2017   Procedure: LEFT HEART CATH AND CORONARY ANGIOGRAPHY;  Surgeon:  Tonny Bollmanooper, Michael, MD;  [facility];  Service: Cardiovascular;  Laterality: N/A;  . PARATHYROIDECTOMY    . PERICARDIAL TAP  2000   ?; in [place]/notes 01/18/2011   Family History:  Family History  Problem Relation Age of Onset  . Hypertension Mother   . Diabetes Mother   . Heart disease Father   . Hypertension Sister   . Diabetes Sister   .  Hypertension Brother   . Diabetes Brother   . Hypertension Sister   . Diabetes Sister   . Hypertension Brother   . Diabetes Brother    Family Psychiatric  History: see H&P Social History:  Social History   Substance and Sexual Activity  Alcohol Use Yes   Comment: 06/12/2017 "stopped ~ 1 yr ago"      Social History   Substance and Sexual Activity  Drug Use Yes  . Types: Cocaine   Comment: 06/12/2017 "stopped using cocaine ~ 1 yr ago; used marijuana when I was younger" last cocaine use 9 mths ago per pt on 06/15/17    Social History   Socioeconomic History  . Marital status: Divorced    Spouse name: None  . Number of children: None  . Years of education: None  . Highest education level: None  Social Needs  . Financial resource strain: None  . Food insecurity - worry: None  . Food insecurity - inability: None  . Transportation needs - medical: None  . Transportation needs - non-medical: None  Occupational History  . None  Tobacco Use  . Smoking status: Current Every Day Smoker    Packs/day: 0.50    Years: 38.00    Pack years: 19.00    Types: Cigarettes  . Smokeless tobacco: Current User    Types: Chew  Substance and Sexual Activity  . Alcohol use: Yes    Comment: 06/12/2017 "stopped ~ 1 yr ago"   . Drug use: Yes    Types: Cocaine    Comment: 06/12/2017 "stopped using cocaine ~ 1 yr ago; used marijuana when I was younger" last cocaine use 9 mths ago per pt on 06/15/17  . Sexual activity: Not Currently  Other Topics Concern  . None  Social History Narrative  . None   Additional Social History:                         Sleep: Good  Appetite:  Fair  Current Medications: Current Facility-Administered Medications  Medication Dose Route Frequency Provider Last Rate Last Dose  . alum & mag hydroxide-simeth (MAALOX/MYLANTA) 200-200-20 MG/5ML suspension 30 mL  30 mL Oral Q4H PRN [name], [name], NP   30 mL at 10/09/17 0645  . atorvastatin (LIPITOR) tablet  40 mg  40 mg Oral Daily [name], [name], NP   40 mg at 10/11/17 [id number]  . gabapentin (NEURONTIN) capsule 300 mg  300 mg Oral TID [name], Hagan Vanauken T, MD   300 mg at 10/11/17 1111  . hydrochlorothiazide (HYDRODIURIL) tablet 25 mg  25 mg Oral Daily [name], [name], NP   25 mg at 10/11/17 [id number]  . hydrOXYzine (ATARAX/VISTARIL) tablet 25 mg  25 mg Oral TID PRN [name], [name], NP   25 mg at 10/11/17 1110  . ibuprofen (ADVIL,MOTRIN) tablet 600 mg  600 mg Oral Q6H PRN Micheal Likensainville, Brayam Boeke T, MD   600 mg at 10/11/17 1302  . insulin aspart protamine- aspart (NOVOLOG MIX 70/30) injection 60 Units  60 Units Subcutaneous Q  breakfast Fransisca Kaufmann A, NP   60 Units at 10/11/17 1308   And  . insulin aspart protamine- aspart (NOVOLOG MIX 70/30) injection 45 Units  45 Units Subcutaneous Q supper Thermon Leyland, NP   45 Units at 10/10/17 1659  . magnesium hydroxide (MILK OF MAGNESIA) suspension 30 mL  30 mL Oral Daily PRN Fransisca Kaufmann A, NP      . metoprolol tartrate (LOPRESSOR) tablet 25 mg  25 mg Oral BID Fransisca Kaufmann A, NP   25 mg at 10/11/17 6578  . nicotine (NICODERM CQ - dosed in mg/24 hours) patch 14 mg  14 mg Transdermal Daily Fransisca Kaufmann A, NP   14 mg at 10/11/17 4696  . risperiDONE (RISPERDAL) tablet 2 mg  2 mg Oral QHS Jolyne Loa T, MD      . sertraline (ZOLOFT) tablet 50 mg  50 mg Oral Daily Fransisca Kaufmann A, NP   50 mg at 10/11/17 2952  . traZODone (DESYREL) tablet 50 mg  50 mg Oral QHS PRN Thermon Leyland, NP   50 mg at 10/10/17 2100    Lab Results:  Results for orders placed or performed during the hospital encounter of 10/08/17 (from the past 48 hour(s))  Glucose, capillary     Status: Abnormal   Collection Time: 10/09/17  5:16 PM  Result Value Ref Range   Glucose-Capillary 385 (H) 65 - 99 mg/dL  Glucose, capillary     Status: Abnormal   Collection Time: 10/09/17  8:07 PM  Result Value Ref Range   Glucose-Capillary 289 (H) 65 - 99 mg/dL  Glucose, capillary     Status: Abnormal    Collection Time: 10/10/17  6:15 AM  Result Value Ref Range   Glucose-Capillary 199 (H) 65 - 99 mg/dL  Glucose, capillary     Status: Abnormal   Collection Time: 10/10/17 12:04 PM  Result Value Ref Range   Glucose-Capillary 194 (H) 65 - 99 mg/dL  Glucose, capillary     Status: Abnormal   Collection Time: 10/10/17  4:55 PM  Result Value Ref Range   Glucose-Capillary 333 (H) 65 - 99 mg/dL  Glucose, capillary     Status: Abnormal   Collection Time: 10/10/17  8:19 PM  Result Value Ref Range   Glucose-Capillary 363 (H) 65 - 99 mg/dL   Comment 1 Notify RN    Comment 2 Document in Chart   Glucose, capillary     Status: Abnormal   Collection Time: 10/11/17  6:05 AM  Result Value Ref Range   Glucose-Capillary 143 (H) 65 - 99 mg/dL   Comment 1 Notify RN    Comment 2 Document in Chart   Glucose, capillary     Status: Abnormal   Collection Time: 10/11/17 12:00 PM  Result Value Ref Range   Glucose-Capillary 200 (H) 65 - 99 mg/dL    Blood Alcohol level:  Lab Results  Component Value Date   ETH <10 10/07/2017   ETH <10 07/07/2017    Metabolic Disorder Labs: Lab Results  Component Value Date   HGBA1C 10.0 (H) 07/09/2017   MPG 240.3 07/09/2017   MPG 225.95 06/10/2017   No results found for: PROLACTIN Lab Results  Component Value Date   CHOL 85 06/10/2017   TRIG 101 06/10/2017   HDL 22 (L) 06/10/2017   CHOLHDL 3.9 06/10/2017   VLDL 20 06/10/2017   LDLCALC 43 06/10/2017    Physical Findings: AIMS: Facial and Oral Movements Muscles of Facial Expression: None, normal Lips  and Perioral Area: None, normal Jaw: None, normal Tongue: None, normal,Extremity Movements Upper (arms, wrists, hands, fingers): None, normal Lower (legs, knees, ankles, toes): None, normal, Trunk Movements Neck, shoulders, hips: None, normal, Overall Severity Severity of abnormal movements (highest score from questions above): None, normal Incapacitation due to abnormal movements: None, normal Patient's  awareness of abnormal movements (rate only patient's report): No Awareness, Dental Status Current problems with teeth and/or dentures?: No Does patient usually wear dentures?: No  CIWA:    COWS:     Musculoskeletal: Strength & Muscle Tone: within normal limits Gait & Station: normal Patient leans: N/A  Psychiatric Specialty Exam: Physical Exam  Nursing note and vitals reviewed.   Review of Systems  Constitutional: Negative for chills and fever.  Respiratory: Negative for cough and shortness of breath.   Cardiovascular: Negative for chest pain.  Gastrointestinal: Negative for abdominal pain, heartburn, nausea and vomiting.  Psychiatric/Behavioral: Positive for depression, hallucinations and suicidal ideas. The patient is nervous/anxious. The patient does not have insomnia.     Blood pressure 109/74, pulse 100, temperature 98.4 F (36.9 C), temperature source Oral, resp. rate 18, height 6\' 2"  (1.88 m), weight 114.3 kg (252 lb).Body mass index is 32.35 kg/m.  General Appearance: Casual and Fairly Groomed  Eye Contact:  Good  Speech:  Clear and Coherent and Normal Rate  Volume:  Normal  Mood:  Anxious  Affect:  Appropriate and Congruent  Thought Process:  Coherent and Goal Directed  Orientation:  Full (Time, Place, and Person)  Thought Content:  Hallucinations: Auditory  Suicidal Thoughts:  Yes.  without intent/plan  Homicidal Thoughts:  Yes.  without intent/plan  Memory:  Immediate;   Fair Recent;   Fair Remote;   Fair  Judgement:  Fair  Insight:  Fair  Psychomotor Activity:  Normal  Concentration:  Concentration: Fair  Recall:  [employer] of Knowledge:  Fair  Language:  Fair  Akathisia:  No  Handed:    AIMS (if indicated):     Assets:  Communication Skills Leisure Time Physical Health Resilience Social Support  ADL's:  Intact  Cognition:  WNL  Sleep:  Number of Hours: 6.75   Treatment Plan Summary: Daily contact with patient to assess and evaluate symptoms and  progress in treatment and Medication management. Pt has some ongoing depression, SI, and AH, but overall he reports improvement of his presenting symptoms, and he will continue to work with SW team in arranging for a plan for after discharge.  - Continue inpatient hospitalization  -Bipolar I, current episode mixed, with psychotic features - Continue risperdal 2mg  po qhs - Continue zoloft 50mg  po qDay  - Anxiety - Continue gabapentin 300mg  po BID - Continue atarax 25mg  po TID prn anxiety  - DMII - Continue Novolog  -HLD - Continue lipitor  -HTN - Continue HCTZ and lopressor  - Insomnia - Continue trazodone 50mg  po qhs prn insomnia  -Encourage participation in groups and the therapeutic milieu  -Discharge planning will be ongoing   Micheal Likens, MD    Micheal Likens, MD 10/11/2017, 1:57 PM

## 2017-10-12 DIAGNOSIS — F1411 Cocaine abuse, in remission: Secondary | ICD-10-CM

## 2017-10-12 LAB — GLUCOSE, CAPILLARY
Glucose-Capillary: 154 mg/dL — ABNORMAL HIGH (ref 65–99)
Glucose-Capillary: 318 mg/dL — ABNORMAL HIGH (ref 65–99)
Glucose-Capillary: 356 mg/dL — ABNORMAL HIGH (ref 65–99)
Glucose-Capillary: 488 mg/dL — ABNORMAL HIGH (ref 65–99)
Glucose-Capillary: 399 mg/dL — ABNORMAL HIGH (ref 65–99)

## 2017-10-12 MED ORDER — INSULIN ASPART 100 UNIT/ML ~~LOC~~ SOLN
5.0000 [IU] | Freq: Once | SUBCUTANEOUS | Status: AC
  Administered 2017-10-12: 5 [IU] via SUBCUTANEOUS

## 2017-10-12 NOTE — [facility] Group Notes (Signed)
Adult Psychoeducational Group Note  Date:  10/12/2017 Time:  5:01 AM  Group Topic/Focus:  Wrap-Up Group:   The focus of this group is to help patients review their daily goal of treatment and discuss progress on daily workbooks.  Participation Level:  Active  Participation Quality:  Appropriate and Attentive  Affect:  Appropriate and Flat  Cognitive:  Alert and Appropriate  Insight: Appropriate and Good  Engagement in Group:  Engaged  Modes of Intervention:  Discussion and Education  Additional Comments:  Pt attended and participated in wrap up group this evening. Pt had a struggling day because they were fighting things that they can't see. Pt goal was to attend all groups and they made it to both except the morning group. A positive noted by the pt was "life".   Chrisandra NettersOctavia A Mikeila Burgen 10/12/2017, 5:01 AM

## 2017-10-12 NOTE — Progress Notes (Signed)
Recreation Therapy Notes  Date: 10/12/17 Time: 1000 Location: 500 Hall  Group Topic: Team Building   Goal Area(s) Addresses:  Patient will effectively work together in group.  Patient will verbalize benefit of healthy team building. Patient will verbalize positive effect of healthy team building post d/c.  Patient will identify team building techniques that made activity effective for group.   Intervention: Veterinary surgeonubber Discs  Activity: [place] in [employer].  Each patient was given a rubber disc.  The group as a whole was given one extra disc.  Patients were to work together to get from one end of the hall to the other and then back to their starting point.  Education:  Team Building, Discharge Planning  Education Outcome: Acknowledges understanding/In group clarification offered/Needs additional education.   Clinical Observations/Feedback:  Pt did not attend group.     Caroll RancherMarjette Aundrea Horace, LRT/CTRS         Caroll RancherLindsay, Swanson Farnell A 10/12/2017 12:46 PM

## 2017-10-12 NOTE — Progress Notes (Signed)
Patient complained of depression, racing thoughts and anger.  Patient states that he feels overwhelmed by his current situation.   Assess patient for safety, offer medications as prescribed, engage patient in 1:1 staff talks.   Patient able to contract for safety. Continue to monitor for safety.

## 2017-10-12 NOTE — Progress Notes (Signed)
[facility] MD Progress Note  10/12/2017 3:10 PM JAMONTAE THWAITES  MRN:  [id number]  Subjective: Dorris reports, " I'm still having the suicidal thoughts. The meds seem to be doing okay, especially the night meds. I'm getting better, just not where I need to be yet. I'm still hearing voices, seeing images & flashes of light. The voices are mocking me, telling me that I'm no good, to kill myself. I know better not to kill myself".  Objective: Wisam Siefring is a 57 y/o M with history of bipolar disorder with psychotic features and polysubstance abuse who was admitted voluntarily with worsening depression, AH, and SI with plan to jump from a cliff. Pt had 3 ED presentations within the past week. He also reported using cocaine, opiates, and THC. Pt has recent history of discharge from [facility] in October 2018 to outpatient level of care.Pt was restarted on previous outpatient medications of gabapentin, zoloft, and risperdal, and he has been reporting gradual improvement of his presenting symptoms.  Today upon evaluation, pt shares, "I'm here today." He continues to endorse depression and he reports that his mood was worsened when he found out that he would not be able to attend [facility] for substance use treatment. Pt reports having SI as, stating, "the voices are reminding that I'm worthless, to kill myself" Pt continues to endorse HI towards his previous landlord, but he acknowledges that he has no intent and that enacting his thoughts "would be a bad idea for me legally and other ways." He reports some ongoing AH, but he notes that they have improved overall during his admission. He denies VH. Pt plans to continue to work with SW team about arranging for an appropriate plan for after discharge. He is in agreement to continue his current treatment plan without changes.  Principal Problem: Bipolar disorder, curr episode mixed, severe, with psychotic features (HCC)  Diagnosis:   Patient Active Problem List   Diagnosis Date Noted   . Bipolar disorder, curr episode mixed, severe, with psychotic features (HCC) [F31.64] 10/08/2017  . Polysubstance (including opioids) dependence with physiological dependence (HCC) [F19.20] 10/06/2017  . 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 Chest pain [R07.9] 07/10/2017  . Angina pectoris (HCC) [I20.9] 07/09/2017  . Tobacco abuse [Z72.0] 07/09/2017  . Hypertension [I10] 07/09/2017  . Type II diabetes mellitus (HCC) [E11.9]   . GERD (gastroesophageal reflux disease) [K21.9]   . Chronic lower back pain [M54.5, G89.29]   . Poorly controlled diabetes mellitus (HCC) [E11.65]   . Hyperlipidemia with target LDL less than 70 [E78.5]   . Schizoaffective disorder, bipolar type (HCC) [F25.0] 06/16/2017  . Depression [F32.9] 06/15/2017  . Other schizophrenia (HCC) [F20.89]   . Pneumonia [J18.9] 06/11/2017  . Community acquired pneumonia of right lower lobe of lung (HCC) [J18.1]   . Hyperglycemia [R73.9]   . Localized edema [R60.0]   . Shortness of breath [R06.02] 06/10/2017   Total Time spent with patient: 15 minutes  Past Psychiatric History: see H&P  Past Medical History:  Past Medical History:  Diagnosis Date  . Anxiety   . Arthritis    "back" (06/12/2017)  . Bipolar disorder (HCC)   . CAP (community acquired pneumonia) 06/10/2017  . Chronic lower back pain   . Degenerative disc disease, lumbar   . Depression   . Fibromyalgia   . GERD (gastroesophageal reflux disease)   . High cholesterol   . Hypertension   . Myocardial infarction (HCC) 2002  . Pneumonia  1989  . Schizophrenia (HCC)   . Type II diabetes mellitus (HCC)     Past Surgical History:  Procedure Laterality Date  . CARDIAC CATHETERIZATION  12/2006   [name]/notes 01/18/2011  . INGUINAL HERNIA REPAIR Right   . LEFT HEART CATH AND CORONARY ANGIOGRAPHY N/A 07/11/2017   Procedure: LEFT HEART CATH AND CORONARY ANGIOGRAPHY;  Surgeon: Tonny Bollmanooper, Michael, MD;   [facility];  Service: Cardiovascular;  Laterality: N/A;  . PARATHYROIDECTOMY    . PERICARDIAL TAP  2000   ?; in [place]/notes 01/18/2011   Family History:  Family History  Problem Relation Age of Onset  . Hypertension Mother   . Diabetes Mother   . Heart disease Father   . Hypertension Sister   . Diabetes Sister   . Hypertension Brother   . Diabetes Brother   . Hypertension Sister   . Diabetes Sister   . Hypertension Brother   . Diabetes Brother    Family Psychiatric  History: See H&P  Social History:  Social History   Substance and Sexual Activity  Alcohol Use Yes   Comment: 06/12/2017 "stopped ~ 1 yr ago"      Social History   Substance and Sexual Activity  Drug Use Yes  . Types: Cocaine   Comment: 06/12/2017 "stopped using cocaine ~ 1 yr ago; used marijuana when I was younger" last cocaine use 9 mths ago per pt on 06/15/17    Social History   Socioeconomic History  . Marital status: Divorced    Spouse name: None  . Number of children: None  . Years of education: None  . Highest education level: None  Social Needs  . Financial resource strain: None  . Food insecurity - worry: None  . Food insecurity - inability: None  . Transportation needs - medical: None  . Transportation needs - non-medical: None  Occupational History  . None  Tobacco Use  . Smoking status: Current Every Day Smoker    Packs/day: 0.50    Years: 38.00    Pack years: 19.00    Types: Cigarettes  . Smokeless tobacco: Current User    Types: Chew  Substance and Sexual Activity  . Alcohol use: Yes    Comment: 06/12/2017 "stopped ~ 1 yr ago"   . Drug use: Yes    Types: Cocaine    Comment: 06/12/2017 "stopped using cocaine ~ 1 yr ago; used marijuana when I was younger" last cocaine use 9 mths ago per pt on 06/15/17  . Sexual activity: Not Currently  Other Topics Concern  . None  Social History Narrative  . None   Additional Social History:   Sleep: Good  Appetite:   Fair  Current Medications: Current Facility-Administered Medications  Medication Dose Route Frequency Provider Last Rate Last Dose  . alum & mag hydroxide-simeth (MAALOX/MYLANTA) 200-200-20 MG/5ML suspension 30 mL  30 mL Oral Q4H PRN [name], [name], NP   30 mL at 10/09/17 0645  . atorvastatin (LIPITOR) tablet 40 mg  40 mg Oral Daily [name], [name], NP   40 mg at 10/12/17 0806  . gabapentin (NEURONTIN) capsule 300 mg  300 mg Oral TID Micheal Likensainville, Christopher T, MD   300 mg at 10/12/17 1206  . hydrochlorothiazide (HYDRODIURIL) tablet 25 mg  25 mg Oral Daily [name], [name], NP   25 mg at 10/12/17 [id number]  . hydrOXYzine (ATARAX/VISTARIL) tablet 25 mg  25 mg Oral TID PRN [name], [name], NP   25 mg at 10/12/17 1206  .  ibuprofen (ADVIL,MOTRIN) tablet 600 mg  600 mg Oral Q6H PRN Micheal Likens, MD   600 mg at 10/12/17 0813  . insulin aspart protamine- aspart (NOVOLOG MIX 70/30) injection 60 Units  60 Units Subcutaneous Q breakfast Fransisca Kaufmann A, NP   60 Units at 10/12/17 0809   And  . insulin aspart protamine- aspart (NOVOLOG MIX 70/30) injection 45 Units  45 Units Subcutaneous Q supper Thermon Leyland, NP   45 Units at 10/11/17 1711  . magnesium hydroxide (MILK OF MAGNESIA) suspension 30 mL  30 mL Oral Daily PRN Fransisca Kaufmann A, NP      . metoprolol tartrate (LOPRESSOR) tablet 25 mg  25 mg Oral BID Fransisca Kaufmann A, NP   25 mg at 10/12/17 0805  . nicotine (NICODERM CQ - dosed in mg/24 hours) patch 14 mg  14 mg Transdermal Daily Fransisca Kaufmann A, NP   14 mg at 10/12/17 0806  . risperiDONE (RISPERDAL) tablet 2 mg  2 mg Oral QHS Micheal Likens, MD   2 mg at 10/11/17 2100  . sertraline (ZOLOFT) tablet 50 mg  50 mg Oral Daily Fransisca Kaufmann A, NP   50 mg at 10/12/17 0806  . traZODone (DESYREL) tablet 50 mg  50 mg Oral QHS PRN Thermon Leyland, NP   50 mg at 10/11/17 2100    Lab Results:  Results for orders placed or performed during the hospital encounter of 10/08/17 (from the past 48 hour(s))   Glucose, capillary     Status: Abnormal   Collection Time: 10/10/17  4:55 PM  Result Value Ref Range   Glucose-Capillary 333 (H) 65 - 99 mg/dL  Glucose, capillary     Status: Abnormal   Collection Time: 10/10/17  8:19 PM  Result Value Ref Range   Glucose-Capillary 363 (H) 65 - 99 mg/dL   Comment 1 Notify RN    Comment 2 Document in Chart   Glucose, capillary     Status: Abnormal   Collection Time: 10/11/17  6:05 AM  Result Value Ref Range   Glucose-Capillary 143 (H) 65 - 99 mg/dL   Comment 1 Notify RN    Comment 2 Document in Chart   Glucose, capillary     Status: Abnormal   Collection Time: 10/11/17 12:00 PM  Result Value Ref Range   Glucose-Capillary 200 (H) 65 - 99 mg/dL  Glucose, capillary     Status: Abnormal   Collection Time: 10/11/17  5:09 PM  Result Value Ref Range   Glucose-Capillary 329 (H) 65 - 99 mg/dL  Glucose, capillary     Status: Abnormal   Collection Time: 10/11/17  8:24 PM  Result Value Ref Range   Glucose-Capillary 322 (H) 65 - 99 mg/dL   Comment 1 Notify RN   Glucose, capillary     Status: Abnormal   Collection Time: 10/12/17  6:18 AM  Result Value Ref Range   Glucose-Capillary 154 (H) 65 - 99 mg/dL  Glucose, capillary     Status: Abnormal   Collection Time: 10/12/17 11:52 AM  Result Value Ref Range   Glucose-Capillary 318 (H) 65 - 99 mg/dL    Blood Alcohol level:  Lab Results  Component Value Date   ETH <10 10/07/2017   ETH <10 07/07/2017    Metabolic Disorder Labs: Lab Results  Component Value Date   HGBA1C 10.0 (H) 07/09/2017   MPG 240.3 07/09/2017   MPG 225.95 06/10/2017   No results found for: PROLACTIN Lab Results  Component Value  Date   CHOL 85 06/10/2017   TRIG 101 06/10/2017   HDL 22 (L) 06/10/2017   CHOLHDL 3.9 06/10/2017   VLDL 20 06/10/2017   LDLCALC 43 06/10/2017    Physical Findings: AIMS: Facial and Oral Movements Muscles of Facial Expression: None, normal Lips and Perioral Area: None, normal Jaw: None,  normal Tongue: None, normal,Extremity Movements Upper (arms, wrists, hands, fingers): None, normal Lower (legs, knees, ankles, toes): None, normal, Trunk Movements Neck, shoulders, hips: None, normal, Overall Severity Severity of abnormal movements (highest score from questions above): None, normal Incapacitation due to abnormal movements: None, normal Patient's awareness of abnormal movements (rate only patient's report): No Awareness, Dental Status Current problems with teeth and/or dentures?: No Does patient usually wear dentures?: No  CIWA:    COWS:     Musculoskeletal: Strength & Muscle Tone: within normal limits Gait & Station: normal Patient leans: N/A  Psychiatric Specialty Exam: Physical Exam  Nursing note and vitals reviewed.   Review of Systems  Constitutional: Negative for chills and fever.  Respiratory: Negative for cough and shortness of breath.   Cardiovascular: Negative for chest pain.  Gastrointestinal: Negative for abdominal pain, heartburn, nausea and vomiting.  Psychiatric/Behavioral: Positive for depression, hallucinations and suicidal ideas. The patient is nervous/anxious. The patient does not have insomnia.     Blood pressure 122/79, pulse 88, temperature 98.4 F (36.9 C), temperature source Oral, resp. rate 18, height 6\' 2"  (1.88 m), weight 114.3 kg (252 lb).Body mass index is 32.35 kg/m.  General Appearance: Casual and Fairly Groomed  Eye Contact:  Good  Speech:  Clear and Coherent and Normal Rate  Volume:  Normal  Mood:  Anxious  Affect:  Appropriate and Congruent  Thought Process:  Coherent and Goal Directed  Orientation:  Full (Time, Place, and Person)  Thought Content:  Hallucinations: Auditory  Suicidal Thoughts:  Yes.  without intent/plan  Homicidal Thoughts:  Yes.  without intent/plan  Memory:  Immediate;   Fair Recent;   Fair Remote;   Fair  Judgement:  Fair  Insight:  Fair  Psychomotor Activity:  Normal  Concentration:  Concentration:  Fair  Recall:  [employer] of Knowledge:  Fair  Language:  Fair  Akathisia:  No  Handed:    AIMS (if indicated):     Assets:  Communication Skills Leisure Time Physical Health Resilience Social Support  ADL's:  Intact  Cognition:  WNL  Sleep:  Number of Hours: 6.5   Treatment Plan Summary: Daily contact with patient to assess and evaluate symptoms and progress in treatment and Medication management. Pt has some ongoing depression, SI, and AH, but overall he reports improvement of his presenting symptoms, and he will continue to work with SW team in arranging for a plan for after discharge.  - Continue inpatient hospitalization. - Will continue today 10/12/2017 plan as below except where it is noted.  -Bipolar I, current episode mixed, with psychotic features - Continue risperdal 2mg  po qhs - Continue zoloft 50mg  po qDay  - Anxiety - Continue gabapentin 300mg  po BID - Continue atarax 25mg  po TID prn anxiety  - DMII - Continue Novolog  -HLD - Continue lipitor  -HTN - Continue HCTZ and lopressor  - Insomnia - Continue trazodone 50mg  po qhs prn insomnia  -Encourage participation in groups and the therapeutic milieu  -Discharge planning will be ongoing  Armandina Stammer, NP, PMHNP, FNP-BC 10/12/2017, 3:10 PMPatient ID: [name], male   DOB: Oct 20, 1960, 57 y.o.   MRN: [id number]

## 2017-10-12 NOTE — Progress Notes (Signed)
Adult Psychoeducational Group Note  Date:  10/12/2017 Time:  8:50 PM  Group Topic/Focus:  Wrap-Up Group:   The focus of this group is to help patients review their daily goal of treatment and discuss progress on daily workbooks.  Participation Level:  Active  Participation Quality:  Appropriate  Affect:  Appropriate  Cognitive:  Appropriate  Insight: Appropriate  Engagement in Group:  Engaged  Modes of Intervention:  Discussion  Additional Comments: The patient expressed that he rates today a 7.The patient also said that he reach his goal to attend groups.The [facility].  [name], Rhilyn Battle Lee 10/12/2017, 8:50 PM

## 2017-10-13 DIAGNOSIS — F1490 Cocaine use, unspecified, uncomplicated: Secondary | ICD-10-CM

## 2017-10-13 LAB — GLUCOSE, CAPILLARY
GLUCOSE-CAPILLARY: 239 mg/dL — AB (ref 65–99)
Glucose-Capillary: 148 mg/dL — ABNORMAL HIGH (ref 65–99)
Glucose-Capillary: 348 mg/dL — ABNORMAL HIGH (ref 65–99)
Glucose-Capillary: 292 mg/dL — ABNORMAL HIGH (ref 65–99)

## 2017-10-13 MED ORDER — INSULIN ASPART 100 UNIT/ML ~~LOC~~ SOLN
2.0000 [IU] | Freq: Once | SUBCUTANEOUS | Status: AC
  Administered 2017-10-13: 2 [IU] via SUBCUTANEOUS

## 2017-10-13 MED ORDER — SERTRALINE HCL 100 MG PO TABS
100.0000 mg | ORAL_TABLET | Freq: Every day | ORAL | Status: DC
  Administered 2017-10-14 – 2017-10-18 (×5): 100 mg via ORAL
  Filled 2017-10-13 (×7): qty 1
  Filled 2017-10-13: qty 2

## 2017-10-13 NOTE — Progress Notes (Signed)
D: Pt SI-contracts for safety, pt stated he was anxious earlier today worrying about things. Pt is pleasant and cooperative. Pt visible on unit some of the evening. Pt CBG- 292 and pt was given 1 x 2 units  Novolog per NP- Barbara CowerJason A: Pt was offered support and encouragement. Pt was given scheduled medications. Pt was encourage to attend groups. Q 15 minute checks were done for safety.   R:Pt attends groups and interacts well with peers and staff. Pt is taking medication. Pt has no complaints.Pt receptive to treatment and safety maintained on unit.

## 2017-10-13 NOTE — Progress Notes (Signed)
Patient CBG at 2031 was 488. Patient endorsed feeling "a little off". Patient was steady on his feet and in no acute distress. Patient states, "I had potatoes tonight at dinner, that always messes it up".  NP Nira ConnJason Berry notified. New orders received. Patient medicated as prescribed.  CBG rechecked at 2135 and was 399. Patient asleep by 2145 in no acute distress.

## 2017-10-13 NOTE — [facility] Group Notes (Signed)
LCSW Group Therapy Note  10/13/2017 9:30-10:30AM - 300 Hall, 10:30-11:30 - 400 Hall, 11:30-12:00 - 500 Hall  Type of Therapy and Topic:  Group Therapy: Anger Cues and Responses  Participation Level:  Did Not Attend   Description of Group:   In this group, patients learned how to recognize the physical, cognitive, emotional, and behavioral responses they have to anger-provoking situations.  They identified a recent time they became angry and how they reacted.  They analyzed how their reaction was possibly beneficial and how it was possibly unhelpful.  The group discussed a variety of healthier coping skills that could help with such a situation in the future.  Deep breathing was practiced briefly.  Therapeutic Goals: 1. Patients will remember their last incident of anger and how they felt emotionally and physically, what their thoughts were at the time, and how they behaved. 2. Patients will identify how their behavior at that time worked for them, as well as how it worked against them. 3. Patients will explore possible new behaviors to use in future anger situations. 4. Patients will learn that anger itself is normal and cannot be eliminated, and that healthier reactions can assist with resolving conflict rather than worsening situations.  Summary of Patient Progress:  N/A  Therapeutic Modalities:   Cognitive Behavioral Therapy  Charles Orr  10/13/2017 8:52 AM  

## 2017-10-13 NOTE — Progress Notes (Signed)
Nursing Progress Note 1900-0730  D)Patient presents with depressed mood but is pleasant and cooperative this evening. Patient reports he is passive SI [facility] Patient reports auditory hallucinations. Patient denies thoughts of HI this evening. Patient contracts for safety on the unit.Patient denies pain or concerns for writer this evening. Patient compliant with medications and requests Trazodone and Vistaril at bedtime.  A)Patient educated about and provided medication as scheduled or requested per provider's orders. Patient safety maintained with q15 min safety checks. Lowfall risk precautions in place. Emotional support given. 1:1 interaction and active listening provided. Snacks and fluids provided. Labs, vital signs and patient behavior monitored throughout shift. Patient encouraged to work on treatment plan.  R) Patient remains safe on the unit at this time. Patient agrees to make needs known to staff. Will continue to monitor and assess for changes.

## 2017-10-13 NOTE — Progress Notes (Signed)
D - Patient presents withdepressed mood.  Pt is pleasant, calm and cooperative.  Pt reports mild SI w/o plan.  Pt denies AH/VH at this time.Patient contracts for safety on the unit.Patient denies pain.   A -Patient educated about and provided medication as scheduled or requested per provider's orders. Patient safety maintained with q15 min safety checks. Lowfall risk precautions in place. Emotional support given. 1:1 interaction and active listening provided. Snacks and fluids provided.  Patient encouraged to work on treatment plan.  R - Patient remains safe on the unit at this time. Patient agrees to make needs known to staff. Will continue to monitor and assess for changes.

## 2017-10-13 NOTE — Plan of Care (Signed)
  Safety: Periods of time without injury will increase 10/13/2017 2232 - Progressing by Delos HaringPhillips, Donnajean Chesnut A, RN Note Pt safe on the unit at this time
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
## 2017-10-14 LAB — GLUCOSE, CAPILLARY
GLUCOSE-CAPILLARY: 209 mg/dL — AB (ref 65–99)
Glucose-Capillary: 267 mg/dL — ABNORMAL HIGH (ref 65–99)
Glucose-Capillary: 304 mg/dL — ABNORMAL HIGH (ref 65–99)
Glucose-Capillary: 352 mg/dL — ABNORMAL HIGH (ref 65–99)

## 2017-10-14 MED ORDER — INSULIN ASPART 100 UNIT/ML ~~LOC~~ SOLN
0.0000 [IU] | Freq: Three times a day (TID) | SUBCUTANEOUS | Status: DC
  Administered 2017-10-15: 3 [IU] via SUBCUTANEOUS
  Administered 2017-10-15: 15 [IU] via SUBCUTANEOUS
  Administered 2017-10-15 – 2017-10-16 (×2): 5 [IU] via SUBCUTANEOUS
  Administered 2017-10-16: 11 [IU] via SUBCUTANEOUS
  Administered 2017-10-16 – 2017-10-17 (×3): 3 [IU] via SUBCUTANEOUS
  Administered 2017-10-17: 15 [IU] via SUBCUTANEOUS
  Administered 2017-10-18: 3 [IU] via SUBCUTANEOUS

## 2017-10-14 MED ORDER — RISPERIDONE 1 MG PO TABS
1.0000 mg | ORAL_TABLET | ORAL | Status: DC
  Administered 2017-10-15 – 2017-10-18 (×4): 1 mg via ORAL
  Filled 2017-10-14 (×7): qty 1

## 2017-10-14 MED ORDER — INSULIN ASPART 100 UNIT/ML ~~LOC~~ SOLN
3.0000 [IU] | Freq: Once | SUBCUTANEOUS | Status: AC
  Administered 2017-10-14: 3 [IU] via SUBCUTANEOUS

## 2017-10-14 MED ORDER — TRAZODONE HCL 100 MG PO TABS
100.0000 mg | ORAL_TABLET | Freq: Every evening | ORAL | Status: DC | PRN
  Administered 2017-10-14 – 2017-10-17 (×4): 100 mg via ORAL
  Filled 2017-10-14 (×4): qty 1

## 2017-10-14 NOTE — Progress Notes (Addendum)
[facility] MD Progress Note  10/14/2017 2:08 PM Charles ISENHOWER  MRN:  [id number] Subjective: patient states " I am feeling better mentally". He reports generalized pain and discomfort which he attributes to history of fibromyalgia. Reports improving mood, but states still feeling depressed. Denies suicidal plans or intentions and contracts for safety on unit. Does not endorse medication side effects. Objective : I have discussed case with treatment team and have met with patient.  Patient is 57 years old , has been diagnosed with Bipolar Disorder, with a history of psychotic symptoms, and history of substance use disorder. He has history of prior admissions to our unit, had been admitted 1//2018, with diagnosis of Bipolar Disorder, with psychotic features . At the time was discharged on Risperidone, Zoloft . He presented to ED voluntarily due to worsening depression, suicidal ideations, auditory hallucinations, substance abuse. Admission BAL < 10, admission UDS positive for cocaine , opiates, cannabis. Patient reports partial improvement and states he is feeling better. Reports ongoing depression, although improved compared to how he felt prior to admission. Denies current suicidal plan or intention and contracts for safety on unit. Reports auditory hallucinations, which he states tell him to kill his landlord .  Does not present internally preoccupied at this time. No disruptive or agitated behavior on unit, going to some groups.   Principal Problem: Bipolar disorder, curr episode mixed, severe, with psychotic features ([place]) Diagnosis:   Patient Active Problem List   Diagnosis Date Noted  . Bipolar disorder, curr episode mixed, severe, with psychotic features ([place]) [F31.64] 10/08/2017  . Polysubstance (including opioids) dependence with physiological dependence ([place]) [F19.20] 10/06/2017  . 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 Chest pain  [R07.9] 07/10/2017  . Angina pectoris ([place]) [I20.9] 07/09/2017  . Tobacco abuse [Z72.0] 07/09/2017  . Hypertension [I10] 07/09/2017  . Type II diabetes mellitus ([place]) [E11.9]   . GERD (gastroesophageal reflux disease) [K21.9]   . Chronic lower back pain [M54.5, G89.29]   . Poorly controlled diabetes mellitus ([place]) [E11.65]   . Hyperlipidemia with target LDL less than 70 [E78.5]   . Schizoaffective disorder, bipolar type ([place]) [F25.0] 06/16/2017  . Depression [F32.9] 06/15/2017  . Other schizophrenia ([place]) [F20.89]   . Pneumonia [J18.9] 06/11/2017  . Community acquired pneumonia of right lower lobe of lung ([place]) [J18.1]   . Hyperglycemia [R73.9]   . Localized edema [R60.0]   . Shortness of breath [R06.02] 06/10/2017   Total Time spent with patient: 20 minutes  Past Psychiatric History: see H&P  Past Medical History:  Past Medical History:  Diagnosis Date  . Anxiety   . Arthritis    "back" (06/12/2017)  . Bipolar disorder ([place])   . CAP (community acquired pneumonia) 06/10/2017  . Chronic lower back pain   . Degenerative disc disease, lumbar   . Depression   . Fibromyalgia   . GERD (gastroesophageal reflux disease)   . High cholesterol   . Hypertension   . Myocardial infarction ([place]) 2002  . Pneumonia 1989  . Schizophrenia ([place])   . Type II diabetes mellitus ([place])     Past Surgical History:  Procedure Laterality Date  . CARDIAC CATHETERIZATION  12/2006   Archie Endo 01/18/2011  . INGUINAL HERNIA REPAIR Right   . LEFT HEART CATH AND CORONARY ANGIOGRAPHY N/A 07/11/2017   Procedure: LEFT HEART CATH AND CORONARY ANGIOGRAPHY;  Surgeon: Sherren Mocha, MD;  Location: [place] [facility];  Service: Cardiovascular;  Laterality: N/A;  . PARATHYROIDECTOMY    .  PERICARDIAL TAP  2000   ?; in [place]/notes 01/18/2011   Family History:  Family History  Problem Relation Age of Onset  . Hypertension Mother   . Diabetes Mother   . Heart disease Father   . Hypertension Sister   .  Diabetes Sister   . Hypertension Brother   . Diabetes Brother   . Hypertension Sister   . Diabetes Sister   . Hypertension Brother   . Diabetes Brother    Family Psychiatric  History: see H&P Social History:  Social History   Substance and Sexual Activity  Alcohol Use Yes   Comment: 06/12/2017 "stopped ~ 1 yr ago"      Social History   Substance and Sexual Activity  Drug Use Yes  . Types: Cocaine   Comment: 06/12/2017 "stopped using cocaine ~ 1 yr ago; used marijuana when I was younger" last cocaine use 9 mths ago per pt on 06/15/17    Social History   Socioeconomic History  . Marital status: Divorced    Spouse name: None  . Number of children: None  . Years of education: None  . Highest education level: None  Social Needs  . Financial resource strain: None  . Food insecurity - worry: None  . Food insecurity - inability: None  . Transportation needs - medical: None  . Transportation needs - non-medical: None  Occupational History  . None  Tobacco Use  . Smoking status: Current Every Day Smoker    Packs/day: 0.50    Years: 38.00    Pack years: 19.00    Types: Cigarettes  . Smokeless tobacco: Current User    Types: Chew  Substance and Sexual Activity  . Alcohol use: Yes    Comment: 06/12/2017 "stopped ~ 1 yr ago"   . Drug use: Yes    Types: Cocaine    Comment: 06/12/2017 "stopped using cocaine ~ 1 yr ago; used marijuana when I was younger" last cocaine use 9 mths ago per pt on 06/15/17  . Sexual activity: Not Currently  Other Topics Concern  . None  Social History Narrative  . None   Additional Social History:   Sleep: Good  Appetite:  improving   Current Medications: Current Facility-Administered Medications  Medication Dose Route Frequency Provider Last Rate Last Dose  . alum & mag hydroxide-simeth (MAALOX/MYLANTA) 200-200-20 MG/5ML suspension 30 mL  30 mL Oral Q4H PRN Elmarie Shiley A, NP   30 mL at 10/09/17 0645  . atorvastatin (LIPITOR) tablet 40 mg   40 mg Oral Daily Elmarie Shiley A, NP   40 mg at 10/14/17 1694  . gabapentin (NEURONTIN) capsule 300 mg  300 mg Oral TID Pennelope Bracken, MD   300 mg at 10/14/17 1204  . hydrochlorothiazide (HYDRODIURIL) tablet 25 mg  25 mg Oral Daily Elmarie Shiley A, NP   25 mg at 10/14/17 5038  . hydrOXYzine (ATARAX/VISTARIL) tablet 25 mg  25 mg Oral TID PRN Elmarie Shiley A, NP   25 mg at 10/14/17 1301  . ibuprofen (ADVIL,MOTRIN) tablet 600 mg  600 mg Oral Q6H PRN Pennelope Bracken, MD   600 mg at 10/13/17 2106  . insulin aspart protamine- aspart (NOVOLOG MIX 70/30) injection 60 Units  60 Units Subcutaneous Q breakfast Elmarie Shiley A, NP   60 Units at 10/14/17 0856   And  . insulin aspart protamine- aspart (NOVOLOG MIX 70/30) injection 45 Units  45 Units Subcutaneous Q supper Niel Hummer, NP   45 Units at 10/13/17  1721  . magnesium hydroxide (MILK OF MAGNESIA) suspension 30 mL  30 mL Oral Daily PRN Elmarie Shiley A, NP      . metoprolol tartrate (LOPRESSOR) tablet 25 mg  25 mg Oral BID Elmarie Shiley A, NP   25 mg at 10/14/17 0277  . nicotine (NICODERM CQ - dosed in mg/24 hours) patch 14 mg  14 mg Transdermal Daily Elmarie Shiley A, NP   14 mg at 10/14/17 0809  . risperiDONE (RISPERDAL) tablet 2 mg  2 mg Oral QHS Pennelope Bracken, MD   2 mg at 10/13/17 2106  . sertraline (ZOLOFT) tablet 100 mg  100 mg Oral Daily Ambrose Finland, MD   100 mg at 10/14/17 0808  . traZODone (DESYREL) tablet 50 mg  50 mg Oral QHS PRN Niel Hummer, NP   50 mg at 10/13/17 2106    Lab Results:  Results for orders placed or performed during the hospital encounter of 10/08/17 (from the past 48 hour(s))  Glucose, capillary     Status: Abnormal   Collection Time: 10/12/17  5:20 PM  Result Value Ref Range   Glucose-Capillary 356 (H) 65 - 99 mg/dL  Glucose, capillary     Status: Abnormal   Collection Time: 10/12/17  8:31 PM  Result Value Ref Range   Glucose-Capillary 488 (H) 65 - 99 mg/dL  Glucose, capillary      Status: Abnormal   Collection Time: 10/12/17  9:35 PM  Result Value Ref Range   Glucose-Capillary 399 (H) 65 - 99 mg/dL  Glucose, capillary     Status: Abnormal   Collection Time: 10/13/17  6:23 AM  Result Value Ref Range   Glucose-Capillary 148 (H) 65 - 99 mg/dL  Glucose, capillary     Status: Abnormal   Collection Time: 10/13/17 11:53 AM  Result Value Ref Range   Glucose-Capillary 239 (H) 65 - 99 mg/dL  Glucose, capillary     Status: Abnormal   Collection Time: 10/13/17  4:56 PM  Result Value Ref Range   Glucose-Capillary 348 (H) 65 - 99 mg/dL  Glucose, capillary     Status: Abnormal   Collection Time: 10/13/17  8:36 PM  Result Value Ref Range   Glucose-Capillary 292 (H) 65 - 99 mg/dL   Comment 1 Notify RN   Glucose, capillary     Status: Abnormal   Collection Time: 10/14/17  6:08 AM  Result Value Ref Range   Glucose-Capillary 209 (H) 65 - 99 mg/dL   Comment 1 Notify RN   Glucose, capillary     Status: Abnormal   Collection Time: 10/14/17 12:02 PM  Result Value Ref Range   Glucose-Capillary 267 (H) 65 - 99 mg/dL    Blood Alcohol level:  Lab Results  Component Value Date   ETH <10 10/07/2017   ETH <10 41/28/7867    Metabolic Disorder Labs: Lab Results  Component Value Date   HGBA1C 10.0 (H) 07/09/2017   MPG 240.3 07/09/2017   MPG 225.95 06/10/2017   No results found for: PROLACTIN Lab Results  Component Value Date   CHOL 85 06/10/2017   TRIG 101 06/10/2017   HDL 22 (L) 06/10/2017   CHOLHDL 3.9 06/10/2017   VLDL 20 06/10/2017   LDLCALC 43 06/10/2017    Physical Findings: AIMS: Facial and Oral Movements Muscles of Facial Expression: None, normal Lips and Perioral Area: None, normal Jaw: None, normal Tongue: None, normal,Extremity Movements Upper (arms, wrists, hands, fingers): None, normal Lower (legs, knees, ankles, toes): None, normal,  Trunk Movements Neck, shoulders, hips: None, normal, Overall Severity Severity of abnormal movements (highest  score from questions above): None, normal Incapacitation due to abnormal movements: None, normal Patient's awareness of abnormal movements (rate only patient's report): No Awareness, Dental Status Current problems with teeth and/or dentures?: No Does patient usually wear dentures?: No  CIWA:    COWS:     Musculoskeletal: Strength & Muscle Tone: within normal limits Gait & Station: normal Patient leans: N/A  Psychiatric Specialty Exam: Physical Exam  Nursing note and vitals reviewed.   Review of Systems  Constitutional: Negative for chills and fever.  Respiratory: Negative for cough and shortness of breath.   Cardiovascular: Negative for chest pain.  Gastrointestinal: Negative for abdominal pain, heartburn, nausea and vomiting.  Psychiatric/Behavioral: Positive for depression, hallucinations and suicidal ideas. The patient is nervous/anxious. The patient does not have insomnia.   reports generalized aches, discomfort, which he states is related to fibromyalgia, denies chest pain, no dyspnea  Blood pressure 118/68, pulse 81, temperature 98.8 F (37.1 C), resp. rate 16, height 6' 2"  (1.88 m), weight 114.3 kg (252 lb).Body mass index is 32.35 kg/m.  General Appearance: Fairly Groomed  Eye Contact:  Good  Speech:  Normal Rate  Volume:  Normal  Mood:  reports mood is improving   Affect:  mildly anxious, constricted, smiles briefly at times   Thought Process:  Goal Directed and Descriptions of Associations: Circumstantial  Orientation:  Other:  fully alert and attentive   Thought Content:  reports auditory hallucinations telling him to hurt himself and his landlord  No delusions currently expressed, does not appear internally preoccupied   Suicidal Thoughts:  Yes.  without intent/plan currently contracts for safety on unit   Homicidal Thoughts:  yes- reports thoughts of hurting his landlord, does not describe plan   Memory:  recent and remote grossly intact   Judgement:  Impaired   Insight:  Fair  Psychomotor Activity:  decreased   Concentration:  Concentration: Fair and Attention Span: Fair  Recall:  [employer] of Knowledge:  Fair  Language:  Fair  Akathisia:  No  Handed:    AIMS (if indicated):     Assets:  Armed forces logistics/support/administrative officer Leisure Time Physical Health Resilience Social Support  ADL's:  Intact  Cognition:  WNL  Sleep:  Number of Hours: 6.5    Assessment - patient reports partially improved mood, states he is feeling better than before admission. He reports ongoing auditory hallucinations, SI ( although at this time denies plan, intention, contracts for safety on unit) and reports thoughts /auditory hallucinations telling him to kill landlord . Tolerating medications well thus far , denies side effects. Behavior on unit in good control, no agitated or disruptive behavior.  Treatment Plan Summary: Treatment Plan reviewed as below today 2/10  Daily contact with patient to assess and evaluate symptoms and progress in treatment and Medication management.  Encourage group and milieu participation to work on coping skills and symptom reduction Increase Risperidone to 1 mgr QAM and 2 mgr QHS for mood disorder, psychosis Continue  Zoloft 100  mgrs QDAY for depression, anxiety  Continue Neurontin 300 mgrs TID for anxiety, pain  Continue Vistaril 25 mgrs Q 8 hours PRN for anxiety as needed Continue Trazodone 50 mgrs QHS PRN for insomnia as needed  Continue Diabetic and antihypertensive management  Treatement team working on disposition planning options Repeat EKG to monitor QTc interval . ( 2/4 EKG QTc 476)     Jenne Campus, MD  10/14/2017, 2:08 PM   Patient ID: Jodie Echevaria, male   DOB: [birth year], 57 y.o.   MRN: [id number]

## 2017-10-14 NOTE — Progress Notes (Signed)
Pt presents with a flat affect and anxious mood. Pt reports increased anxiety this morning and requested an antianxiety med. Pt reports ongoing AH. Pt endorses suicidal thoughts with an old plan. Pt would not disclose plan to Clinical research associatewriter. Pt denies intent and verbally contracts for safety. Pt rates anxiety 8/10. Pt rates depression 8/10. Medications reviewed with pt. Medications administered as ordered per MD. Verbal support provided. 15 minute checks performed for safety.

## 2017-10-14 NOTE — [facility] Group Notes (Signed)
[facility] Group Notes:  (Nursing/MHT/Case Management/Adjunct)  Date:  10/14/2017  Time:  1000  Type of Therapy:  Nurse Education - Healthy Support Systems vs. Toxic Relationships  Participation Level:  Minimal  Participation Quality:  Attentive  Affect:  Blunted  Cognitive:  Alert  Insight:  Improving  Engagement in Group:  Engaged  Modes of Intervention:  Discussion and Education  Summary of Progress/Problems: Patient came into group late however did not contribute to discussion. He was receptive to education provided regarding topic above.   Lawrence MarseillesFriedman, Anistyn Graddy Eakes 10/14/2017, 10:47 AM

## 2017-10-14 NOTE — Progress Notes (Signed)
Pt did not attend afternoon psychoed group.

## 2017-10-14 NOTE — [facility] Group Notes (Signed)
[facility]  Date/Time:  10/14/2017  11:00AM-12:00PM  Type of Therapy and Topic:  Group Therapy:  Music and Mood  Participation Level:  Active   Description of Group: In this process group, members listened to a variety of genres of music and identified that different types of music evoke different responses.  Patients were encouraged to identify music that was soothing for them and music that was energizing for them.  Patients discussed how this knowledge can help with wellness and recovery in various ways including managing depression and anxiety as well as encouraging healthy sleep habits.    Therapeutic Goals: 1. Patients will explore the impact of different varieties of music on mood 2. Patients will verbalize the thoughts they have when listening to different types of music 3. Patients will identify music that is soothing to them as well as music that is energizing to them 4. Patients will discuss how to use this knowledge to assist in maintaining wellness and recovery 5. Patients will explore the use of music as a coping skill  Summary of Patient Progress:  At the beginning of group, patient expressed that he felt "a feeling" and just smiled.  He interacted with CSW throughout group in terms of saying how different songs made him feel, but did not speak further.  He stated at the end of group he felt "peaceful."  Therapeutic Modalities: Solution Focused Brief Therapy Activity   Ambrose MantleMareida Grossman-Orr, LCSW

## 2017-10-15 LAB — GLUCOSE, CAPILLARY
GLUCOSE-CAPILLARY: 240 mg/dL — AB (ref 65–99)
Glucose-Capillary: 176 mg/dL — ABNORMAL HIGH (ref 65–99)
Glucose-Capillary: 203 mg/dL — ABNORMAL HIGH (ref 65–99)
Glucose-Capillary: 363 mg/dL — ABNORMAL HIGH (ref 65–99)

## 2017-10-15 MED ORDER — HYDROXYZINE HCL 50 MG PO TABS
50.0000 mg | ORAL_TABLET | Freq: Four times a day (QID) | ORAL | Status: DC | PRN
  Administered 2017-10-15 – 2017-10-17 (×6): 50 mg via ORAL
  Filled 2017-10-15 (×6): qty 1

## 2017-10-15 NOTE — Progress Notes (Signed)
Pt presents with a flat affect and depressed mood. Pt reports ongoing anxiety and depression. Pt given Vistaril for anxiety this am. Pt endorses passive SI with no plan or intent. Pt verbally contracts for safety. Pt endorses AH, "voices are not as loud as they were when I came in". Pt reports difficulty sleeping last night due to nightmares. MD made aware of pt complaints in tx team. Medications reviewed with pt. Verbal support offered. Pt encouraged to attend groups. 15 minute checks performed for safety.

## 2017-10-15 NOTE — Progress Notes (Signed)
Recreation Therapy Notes  Date: 10/15/17 Time: 1000 Location: 500 Hall Dayroom  Group Topic: Leisure Education, Goal Setting  Goal Area(s) Addresses:  Patient will be able to identify at least 3 goals.   Patient will be able to identify benefit of investing in goals.  Patient will be able to identify benefit of setting goals.   Behavioral Response:  Engaged  Intervention: Worksheet  Activity: Garment/textile technologistGoal Planning.  Patients were given a worksheet in which they were to identify a goal they wanted to complete within the next week, next month, next year and in five years.  Patients were to then identify the obstacles that are preventing them from reaching their goals, what they need to achieve their goals and what they could start doing today to work towards their goals.  Education:  Discharge Planning, PharmacologistCoping Skills, Leisure Education   Education Outcome: Acknowledges Education/In Group Clarification Provided/Needs Additional Education  Clinical Observations: Pt he wanted to "find better housing" in the next week, start socializing in the next month, get married in the next year and start social groups at his house in the next five years.  Pt identified his obstacles as "I need to think before moving", make a plan to achieve goals and "start looking for a place" today to move toward his goal.    Caroll RancherMarjette Deliah Strehlow, LRT/CTRS      Caroll RancherLindsay, Kodee Drury A 10/15/2017 12:27 PM

## 2017-10-15 NOTE — Plan of Care (Signed)
  Progressing Activity: Interest or engagement in activities will improve 10/15/2017 1243 - Progressing by Layla BarterWhite, Devin Ganaway L, RN Education: Mental status will improve 10/15/2017 1243 - Progressing by Layla BarterWhite, Jennet Scroggin L, RN Coping: Ability to demonstrate self-control will improve 10/15/2017 1243 - Progressing by Layla BarterWhite, Jayen Bromwell L, RN Health Behavior/Discharge Planning: Compliance with treatment plan for underlying cause of condition will improve 10/15/2017 1243 - Progressing by Layla BarterWhite, Rainier Feuerborn L, RN Activity: Interest or engagement in leisure activities will improve 10/15/2017 1243 - Progressing by Layla BarterWhite, Ponce Skillman L, RN

## 2017-10-15 NOTE — Progress Notes (Signed)
Adult Psychoeducational Group Note  Date:  10/15/2017 Time:  10:56 PM  Group Topic/Focus:  Wrap-Up Group:   The focus of this group is to help patients review their daily goal of treatment and discuss progress on daily workbooks.  Participation Level:  Minimal  Participation Quality:  Appropriate  Affect:  Appropriate  Cognitive:  Oriented  Insight: Limited  Engagement in Group:  Engaged  Modes of Intervention:  Socialization and Support  Additional Comments:  Patient attended and participated in group tonight. He reports having a good day. He went to group, went for meals and spoke with his doctor.  Lita MainsFrancis, Chace Bisch [facility] 10/15/2017, 10:56 PM

## 2017-10-15 NOTE — Progress Notes (Signed)
Patient ID: Charles Orr, male   DOB: 06/13/1961, 57 y.o.   MRN: [id number]  D: Patient in dayroom on approach. Pt reports he is doing well. After taking to pt about his elevated blood sugar pt was open to using a sliding scale to help manage his diabetes. Jason,NP notified and a 3 unit novolog given. Pt continue to reports AH without command. Pt denies SI/HI. Cooperative with assessment. No acute distressed noted at this time.   A: Medications administered as prescribed. Support and encouragement provided to attend groups and engage in milieu. Pt encouraged to discuss feelings and come to staff with any question or concerns.   R: Patient remains safe and complaint with medications.

## 2017-10-15 NOTE — Progress Notes (Signed)
[facility] MD Progress Note  10/15/2017 1:11 PM Charles Orr  MRN:  [id number] Subjective:    Charles Orr is a 57 y/o M with history of bipolar disorder with psychotic features and polysubstance abuse who was admitted voluntarily with worsening depression, AH, and SI with plan to jump from a cliff. Pt had 3 ED presentations within the past week. He also reported using cocaine, opiates, and THC. Pt has recent history of discharge from [facility] in October 2018 to outpatient level of care.Pt was restarted on previous outpatient medicationsof gabapentin, zoloft, and risperdal. His doses were titrated up during his stay, and he has been reporting improvement of his presenting symptoms.  Today upon evaluation, pt shares, "I'm doing better today. The voices seem to be diminishing, but my anxiety has been the main problem." Pt reports some ongoing SI today, but he is able to use coping skills to alleviate those thoughts. In regards to SI, he states, "They aren't like they were before, and I'm able to get my mind off them now." He denies HI/VH. He has been sleeping well, but he notes that he felt groggy this morning which may be associated with higher dose of trazodone. Pt requests for increase of dose of vistaril, and we agreed to increase the vistaril dose and frequency, so that he can utilize vistaril as PRN for both anxiety and insomnia. We will reduce trazodone dose to help with morning grogginess. Pt was in agreement with the above plan, and he had no further questions, comments, or concerns.  Principal Problem: Bipolar disorder, curr episode mixed, severe, with psychotic features (HCC) Diagnosis:   Patient Active Problem List   Diagnosis Date Noted  . Bipolar disorder, curr episode mixed, severe, with psychotic features (HCC) [F31.64] 10/08/2017  . Polysubstance (including opioids) dependence with physiological dependence (HCC) [F19.20] 10/06/2017  . 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 Chest pain [R07.9] 07/10/2017  . Angina pectoris (HCC) [I20.9] 07/09/2017  . Tobacco abuse [Z72.0] 07/09/2017  . Hypertension [I10] 07/09/2017  . Type II diabetes mellitus (HCC) [E11.9]   . GERD (gastroesophageal reflux disease) [K21.9]   . Chronic lower back pain [M54.5, G89.29]   . Poorly controlled diabetes mellitus (HCC) [E11.65]   . Hyperlipidemia with target LDL less than 70 [E78.5]   . Schizoaffective disorder, bipolar type (HCC) [F25.0] 06/16/2017  . Depression [F32.9] 06/15/2017  . Other schizophrenia (HCC) [F20.89]   . Pneumonia [J18.9] 06/11/2017  . Community acquired pneumonia of right lower lobe of lung (HCC) [J18.1]   . Hyperglycemia [R73.9]   . Localized edema [R60.0]   . Shortness of breath [R06.02] 06/10/2017   Total Time spent with patient: 30 minutes  Past Psychiatric History: see H&P  Past Medical History:  Past Medical History:  Diagnosis Date  . Anxiety   . Arthritis    "back" (06/12/2017)  . Bipolar disorder (HCC)   . CAP (community acquired pneumonia) 06/10/2017  . Chronic lower back pain   . Degenerative disc disease, lumbar   . Depression   . Fibromyalgia   . GERD (gastroesophageal reflux disease)   . High cholesterol   . Hypertension   . Myocardial infarction (HCC) 2002  . Pneumonia 1989  . Schizophrenia (HCC)   . Type II diabetes mellitus (HCC)     Past Surgical History:  Procedure Laterality Date  . CARDIAC CATHETERIZATION  12/2006   [name] 01/18/2011  . INGUINAL HERNIA REPAIR Right   . LEFT HEART CATH  AND CORONARY ANGIOGRAPHY N/A 07/11/2017   Procedure: LEFT HEART CATH AND CORONARY ANGIOGRAPHY;  Surgeon: Tonny Bollmanooper, Michael, MD;  [facility];  Service: Cardiovascular;  Laterality: N/A;  . PARATHYROIDECTOMY    . PERICARDIAL TAP  2000   ?; in [place]/notes 01/18/2011   Family History:  Family History  Problem Relation Age of Onset  . Hypertension Mother   . Diabetes Mother   .  Heart disease Father   . Hypertension Sister   . Diabetes Sister   . Hypertension Brother   . Diabetes Brother   . Hypertension Sister   . Diabetes Sister   . Hypertension Brother   . Diabetes Brother    Family Psychiatric  History: see H&P Social History:  Social History   Substance and Sexual Activity  Alcohol Use Yes   Comment: 06/12/2017 "stopped ~ 1 yr ago"      Social History   Substance and Sexual Activity  Drug Use Yes  . Types: Cocaine   Comment: 06/12/2017 "stopped using cocaine ~ 1 yr ago; used marijuana when I was younger" last cocaine use 9 mths ago per pt on 06/15/17    Social History   Socioeconomic History  . Marital status: Divorced    Spouse name: None  . Number of children: None  . Years of education: None  . Highest education level: None  Social Needs  . Financial resource strain: None  . Food insecurity - worry: None  . Food insecurity - inability: None  . Transportation needs - medical: None  . Transportation needs - non-medical: None  Occupational History  . None  Tobacco Use  . Smoking status: Current Every Day Smoker    Packs/day: 0.50    Years: 38.00    Pack years: 19.00    Types: Cigarettes  . Smokeless tobacco: Current User    Types: Chew  Substance and Sexual Activity  . Alcohol use: Yes    Comment: 06/12/2017 "stopped ~ 1 yr ago"   . Drug use: Yes    Types: Cocaine    Comment: 06/12/2017 "stopped using cocaine ~ 1 yr ago; used marijuana when I was younger" last cocaine use 9 mths ago per pt on 06/15/17  . Sexual activity: Not Currently  Other Topics Concern  . None  Social History Narrative  . None   Additional Social History:                         Sleep: Good  Appetite:  Good  Current Medications: Current Facility-Administered Medications  Medication Dose Route Frequency Provider Last Rate Last Dose  . alum & mag hydroxide-simeth (MAALOX/MYLANTA) 200-200-20 MG/5ML suspension 30 mL  30 mL Oral Q4H PRN [name],  [name], NP   30 mL at 10/09/17 0645  . atorvastatin (LIPITOR) tablet 40 mg  40 mg Oral Daily [name], [name], NP   40 mg at 10/15/17 0752  . gabapentin (NEURONTIN) capsule 300 mg  300 mg Oral TID Micheal Likensainville, Myla Mauriello T, MD   300 mg at 10/15/17 1202  . hydrochlorothiazide (HYDRODIURIL) tablet 25 mg  25 mg Oral Daily [name], [name], NP   25 mg at 10/15/17 0752  . hydrOXYzine (ATARAX/VISTARIL) tablet 50 mg  50 mg Oral Q6H PRN Micheal Likensainville, Sherard Sutch T, MD   50 mg at 10/15/17 1301  . ibuprofen (ADVIL,MOTRIN) tablet 600 mg  600 mg Oral Q6H PRN Micheal Likensainville, Sydney Hasten T, MD   600 mg at 10/15/17 0751  .  insulin aspart (novoLOG) injection 0-15 Units  0-15 Units Subcutaneous TID WC Nira Conn A, NP   5 Units at 10/15/17 1200  . insulin aspart protamine- aspart (NOVOLOG MIX 70/30) injection 60 Units  60 Units Subcutaneous Q breakfast Fransisca Kaufmann A, NP   60 Units at 10/15/17 1610   And  . insulin aspart protamine- aspart (NOVOLOG MIX 70/30) injection 45 Units  45 Units Subcutaneous Q supper Thermon Leyland, NP   45 Units at 10/14/17 1704  . magnesium hydroxide (MILK OF MAGNESIA) suspension 30 mL  30 mL Oral Daily PRN Fransisca Kaufmann A, NP      . metoprolol tartrate (LOPRESSOR) tablet 25 mg  25 mg Oral BID Fransisca Kaufmann A, NP   25 mg at 10/15/17 0752  . nicotine (NICODERM CQ - dosed in mg/24 hours) patch 14 mg  14 mg Transdermal Daily Fransisca Kaufmann A, NP   14 mg at 10/15/17 0752  . risperiDONE (RISPERDAL) tablet 1 mg  1 mg Oral BH-q7a Cobos, Rockey Situ, MD   1 mg at 10/15/17 0640  . risperiDONE (RISPERDAL) tablet 2 mg  2 mg Oral QHS Micheal Likens, MD   2 mg at 10/14/17 2104  . sertraline (ZOLOFT) tablet 100 mg  100 mg Oral Daily Leata Mouse, MD   100 mg at 10/15/17 0752  . traZODone (DESYREL) tablet 100 mg  100 mg Oral QHS PRN Nira Conn A, NP   100 mg at 10/14/17 2104    Lab Results:  Results for orders placed or performed during the hospital encounter of 10/08/17 (from the past 48  hour(s))  Glucose, capillary     Status: Abnormal   Collection Time: 10/13/17  4:56 PM  Result Value Ref Range   Glucose-Capillary 348 (H) 65 - 99 mg/dL  Glucose, capillary     Status: Abnormal   Collection Time: 10/13/17  8:36 PM  Result Value Ref Range   Glucose-Capillary 292 (H) 65 - 99 mg/dL   Comment 1 Notify RN   Glucose, capillary     Status: Abnormal   Collection Time: 10/14/17  6:08 AM  Result Value Ref Range   Glucose-Capillary 209 (H) 65 - 99 mg/dL   Comment 1 Notify RN   Glucose, capillary     Status: Abnormal   Collection Time: 10/14/17 12:02 PM  Result Value Ref Range   Glucose-Capillary 267 (H) 65 - 99 mg/dL  Glucose, capillary     Status: Abnormal   Collection Time: 10/14/17  4:58 PM  Result Value Ref Range   Glucose-Capillary 304 (H) 65 - 99 mg/dL  Glucose, capillary     Status: Abnormal   Collection Time: 10/14/17  7:54 PM  Result Value Ref Range   Glucose-Capillary 352 (H) 65 - 99 mg/dL  Glucose, capillary     Status: Abnormal   Collection Time: 10/15/17  6:15 AM  Result Value Ref Range   Glucose-Capillary 176 (H) 65 - 99 mg/dL  Glucose, capillary     Status: Abnormal   Collection Time: 10/15/17 11:57 AM  Result Value Ref Range   Glucose-Capillary 203 (H) 65 - 99 mg/dL    Blood Alcohol level:  Lab Results  Component Value Date   ETH <10 10/07/2017   ETH <10 07/07/2017    Metabolic Disorder Labs: Lab Results  Component Value Date   HGBA1C 10.0 (H) 07/09/2017   MPG 240.3 07/09/2017   MPG 225.95 06/10/2017   No results found for: PROLACTIN Lab Results  Component Value  Date   CHOL 85 06/10/2017   TRIG 101 06/10/2017   HDL 22 (L) 06/10/2017   CHOLHDL 3.9 06/10/2017   VLDL 20 06/10/2017   LDLCALC 43 06/10/2017    Physical Findings: AIMS: Facial and Oral Movements Muscles of Facial Expression: None, normal Lips and Perioral Area: None, normal Jaw: None, normal Tongue: None, normal,Extremity Movements Upper (arms, wrists, hands, fingers):  None, normal Lower (legs, knees, ankles, toes): None, normal, Trunk Movements Neck, shoulders, hips: None, normal, Overall Severity Severity of abnormal movements (highest score from questions above): None, normal Incapacitation due to abnormal movements: None, normal Patient's awareness of abnormal movements (rate only patient's report): No Awareness, Dental Status Current problems with teeth and/or dentures?: No Does patient usually wear dentures?: No  CIWA:    COWS:     Musculoskeletal: Strength & Muscle Tone: within normal limits Gait & Station: normal Patient leans: N/A  Psychiatric Specialty Exam: Physical Exam  Nursing note and vitals reviewed.   Review of Systems  Constitutional: Negative for chills and fever.  Gastrointestinal: Negative for abdominal pain, heartburn, nausea and vomiting.  Psychiatric/Behavioral: Positive for depression and suicidal ideas.    Blood pressure 115/77, pulse 80, temperature 97.7 F (36.5 C), temperature source Oral, resp. rate 20, height 6\' 2"  (1.88 m), weight 114.3 kg (252 lb).Body mass index is 32.35 kg/m.  General Appearance: Casual and Fairly Groomed  Eye Contact:  Good  Speech:  Clear and Coherent and Normal Rate  Volume:  Normal  Mood:  Depressed  Affect:  Appropriate, Congruent and Constricted  Thought Process:  Coherent and Goal Directed  Orientation:  Full (Time, Place, and Person)  Thought Content:  Hallucinations: Auditory  Suicidal Thoughts:  Yes.  without intent/plan  Homicidal Thoughts:  No  Memory:  Immediate;   Fair Recent;   Fair Remote;   Fair  Judgement:  Fair  Insight:  Fair  Psychomotor Activity:  Normal  Concentration:  Concentration: Fair  Recall:  [employer] of Knowledge:  Fair  Language:  Fair  Akathisia:  No  Handed:    AIMS (if indicated):     Assets:  Manufacturing systems engineer Physical Health Resilience Social Support  ADL's:  Intact  Cognition:  WNL  Sleep:  Number of Hours: 6.5     Treatment  Plan Summary: Daily contact with patient to assess and evaluate symptoms and progress in treatment and Medication management. Pt reports incremental improvement of his mood and psychotic symptoms during his stay. He has some AM drowsiness a/w trazodone, so we will reduce trazodone dose. He notes some increased anxiety, and so we will increase vistaril dose.   -Continue inpatient hospitalization  -Bipolar I, current episode mixed, with psychotic features -Continue risperdal 1mg  qAM + 2mg  po qhs -Continuezoloft 100mg  po qDay  - Anxiety -Continuegabapentin 300mg  po TID -Changeatarax 25mg  po TID prn anxiety to atarax 50mg  po q6h prn anxiety  - DMII - ContinueNovolog  -HLD - Continue lipitor  -HTN - Continue HCTZ and lopressor  - Insomnia - Change trazodone 100mg  po qhs prn insomnia to trazodone 50mg  po qhs prn insomnia  -Encourage participation in groups and the therapeutic milieu  -Discharge planning will be ongoing     Micheal Likens, MD 10/15/2017, 1:11 PM

## 2017-10-16 LAB — GLUCOSE, CAPILLARY
Glucose-Capillary: 156 mg/dL — ABNORMAL HIGH (ref 65–99)
Glucose-Capillary: 246 mg/dL — ABNORMAL HIGH (ref 65–99)
Glucose-Capillary: 322 mg/dL — ABNORMAL HIGH (ref 65–99)
Glucose-Capillary: 348 mg/dL — ABNORMAL HIGH (ref 65–99)

## 2017-10-16 MED ORDER — INSULIN ASPART PROT & ASPART (70-30 MIX) 100 UNIT/ML ~~LOC~~ SUSP: 60.0000 [IU] | Freq: Every day | SUBCUTANEOUS | 0 refills | Status: DC

## 2017-10-16 MED ORDER — METOPROLOL TARTRATE 25 MG PO TABS: 25.0000 mg | ORAL_TABLET | Freq: Two times a day (BID) | ORAL | 0 refills | Status: DC

## 2017-10-16 MED ORDER — HYDROCHLOROTHIAZIDE 25 MG PO TABS: 25.0000 mg | ORAL_TABLET | Freq: Every day | ORAL | 0 refills | Status: DC

## 2017-10-16 MED ORDER — ATORVASTATIN CALCIUM 40 MG PO TABS: 40.0000 mg | ORAL_TABLET | Freq: Every day | ORAL | 0 refills | Status: DC

## 2017-10-16 MED ORDER — INSULIN ASPART PROT & ASPART (70-30 MIX) 100 UNIT/ML ~~LOC~~ SUSP: 45.0000 [IU] | Freq: Every day | SUBCUTANEOUS | 0 refills | Status: DC

## 2017-10-16 MED ORDER — NICOTINE 14 MG/24HR TD PT24: 14.0000 mg | MEDICATED_PATCH | Freq: Every day | TRANSDERMAL | 0 refills | Status: DC

## 2017-10-16 MED ORDER — TRAZODONE HCL 100 MG PO TABS: 100.0000 mg | ORAL_TABLET | Freq: Every evening | ORAL | 0 refills | Status: DC | PRN

## 2017-10-16 MED ORDER — RISPERIDONE 1 MG PO TABS: ORAL_TABLET | ORAL | 0 refills | Status: DC

## 2017-10-16 MED ORDER — GABAPENTIN 300 MG PO CAPS: 300.0000 mg | ORAL_CAPSULE | Freq: Three times a day (TID) | ORAL | 0 refills | Status: DC

## 2017-10-16 MED ORDER — HYDROXYZINE HCL 50 MG PO TABS: 50.0000 mg | ORAL_TABLET | Freq: Four times a day (QID) | ORAL | 0 refills | Status: DC | PRN

## 2017-10-16 MED ORDER — SERTRALINE HCL 100 MG PO TABS: 100.0000 mg | ORAL_TABLET | Freq: Every day | ORAL | 0 refills | Status: DC

## 2017-10-16 NOTE — Progress Notes (Signed)
Recreation Therapy Notes  Date: 10/16/17 Time: 1000 Location: 500 Hall Dayroom  Group Topic: Communication  Goal Area(s) Addresses:  Patient will effectively communicate with peers in group.  Patient will verbalize benefit of healthy communication. Patient will verbalize positive effect of healthy communication on post d/c goals.  Patient will identify communication techniques that made activity effective for group.   Behavioral Response: Engaged  Intervention: Merchandiser, retailGeometrical drawings, blank paper, pencils    Activity: Geometrical Drawings.  One patient would describe a picture to the rest of the group.  The group would then draw the picture being described to them on their own individual sheets of paper.  The drawers could not ask any questions of the reader.  When completed, the group would examine their drawings against the original to see how close they came.  Education: Communication, Discharge Planning  Education Outcome: Acknowledges understanding/In group clarification offered/Needs additional education.   Clinical Observations/Feedback: Pt stated he wasn't a good drawer but participated anyway.  Pt struggled with drawing the pictures as described.  Pt expressed speaking clear will help get your point across when communicating with others.    Caroll RancherMarjette Murry Diaz, LRT/CTRS      Caroll RancherLindsay, Hairo Garraway A 10/16/2017 11:37 AM

## 2017-10-16 NOTE — [facility] Group Notes (Signed)
[facility] LCSW Group Therapy Note  Date/Time: 10/16/17, 1100  Type of Therapy/Topic:  Group Therapy:  Feelings about Diagnosis  Participation Level:  Active   Mood:pleasant   Description of Group:    This group will allow patients to explore their thoughts and feelings about diagnoses they have received. Patients will be guided to explore their level of understanding and acceptance of these diagnoses. Facilitator will encourage patients to process their thoughts and feelings about the reactions of others to their diagnosis, and will guide patients in identifying ways to discuss their diagnosis with significant others in their lives. This group will be process-oriented, with patients participating in exploration of their own experiences as well as giving and receiving support and challenge from other group members.   Therapeutic Goals: 1. Patient will demonstrate understanding of diagnosis as evidence by identifying two or more symptoms of the disorder:  2. Patient will be able to express two feelings regarding the diagnosis 3. Patient will demonstrate ability to communicate their needs through discussion and/or role plays  Summary of Patient Progress:Pt shared that he agrees with his diagnosis of bipolar disorder. He engaged and contributed to the group discussion about stigma and how to deal with it.b  Good participation.        Therapeutic Modalities:   Cognitive Behavioral Therapy Brief Therapy Feelings Identification   Daleen SquibbGreg Deondre Marinaro, LCSW

## 2017-10-16 NOTE — Progress Notes (Signed)
Adult Psychoeducational Group Note  Date:  10/16/2017 Time:  8:59 PM  Group Topic/Focus:  Wrap-Up Group:   The focus of this group is to help patients review their daily goal of treatment and discuss progress on daily workbooks.  Participation Level:  Active  Participation Quality:  Appropriate  Affect:  Appropriate  Cognitive:  Appropriate  Insight: Appropriate  Engagement in Group:  Engaged  Modes of Intervention:  Discussion  Additional Comments:  The patient expressed that he rates today a 7.The patient also said that attended Communication and Diagnosis Group.  [name], Nisha Dhami Lee 10/16/2017, 8:59 PM

## 2017-10-16 NOTE — Progress Notes (Signed)
Nutrition Brief Note  Patient identified on the Malnutrition Screening Tool (MST) Report  Weight has increased.   Wt Readings from Last 15 Encounters:  10/08/17 252 lb (114.3 kg)  08/04/17 230 lb (104.3 kg)  07/11/17 249 lb 12.8 oz (113.3 kg)  07/07/17 258 lb (117 kg)  06/16/17 258 lb (117 kg)  06/15/17 268 lb (121.6 kg)  06/13/17 264 lb 14.4 oz (120.2 kg)  02/09/16 233 lb (105.7 kg)  11/04/15 233 lb (105.7 kg)  09/02/15 233 lb (105.7 kg)    Body mass index is 32.35 kg/m. Patient meets criteria for obesity based on current BMI.  Labs and medications reviewed.   No nutrition interventions warranted at this time. If nutrition issues arise, please consult RD.   Tilda FrancoLindsey Rikki Smestad, MS, RD, LDN [facility] Clinical Dietitian Pager: [phone] After Hours Pager: [phone]

## 2017-10-16 NOTE — Progress Notes (Signed)
D:Pt reports passive si thoughts and hi towards his landlord. Reported HI thoughts to SW and MD. Pt is taking his medication as prescribed. Per SW, d/c has been canceled today due to pt's HI.  A:Offered support and 15 minute checks.  R:Pt contracts with staff for safety. Safety maintained on the unit.

## 2017-10-16 NOTE — Discharge Summary (Signed)
Physician Discharge Summary Note  Patient:  Charles Orr is an 57 y.o., male MRN:  [id number] DOB:  09-08-1960 Patient phone:  [phone] (home)  Patient address:   [address] [place] [address],   Total Time spent with patient: Greater than 30 minutes  Date of Admission:  10/08/2017 Date of Discharge: 10-18-17  Reason for Admission: Worsening depression, auditory hallucinations & suicidal ideations with plans..  Principal Problem: Bipolar disorder, curr episode mixed, severe, with psychotic features [facility])  Discharge Diagnoses: Patient Active Problem List   Diagnosis Date Noted  . Bipolar disorder, curr episode mixed, severe, with psychotic features (HCC) [F31.64] 10/08/2017  . Polysubstance (including opioids) dependence with physiological dependence (HCC) [F19.20] 10/06/2017  . 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 Chest pain [R07.9] 07/10/2017  . Angina pectoris (HCC) [I20.9] 07/09/2017  . Tobacco abuse [Z72.0] 07/09/2017  . Hypertension [I10] 07/09/2017  . Type II diabetes mellitus (HCC) [E11.9]   . GERD (gastroesophageal reflux disease) [K21.9]   . Chronic lower back pain [M54.5, G89.29]   . Poorly controlled diabetes mellitus (HCC) [E11.65]   . Hyperlipidemia with target LDL less than 70 [E78.5]   . Schizoaffective disorder, bipolar type (HCC) [F25.0] 06/16/2017  . Depression [F32.9] 06/15/2017  . Other schizophrenia (HCC) [F20.89]   . Pneumonia [J18.9] 06/11/2017  . Community acquired pneumonia of right lower lobe of lung (HCC) [J18.1]   . Hyperglycemia [R73.9]   . Localized edema [R60.0]   . Shortness of breath [R06.02] 06/10/2017   Past Psychiatric History: Bipolar disorder with psychotic features.  Past Medical History:  Past Medical History:  Diagnosis Date  . Anxiety   . Arthritis    "back" (06/12/2017)  . Bipolar disorder (HCC)   . CAP (community acquired pneumonia) 06/10/2017   . Chronic lower back pain   . Degenerative disc disease, lumbar   . Depression   . Fibromyalgia   . GERD (gastroesophageal reflux disease)   . High cholesterol   . Hypertension   . Myocardial infarction (HCC) 2002  . Pneumonia 1989  . Schizophrenia (HCC)   . Type II diabetes mellitus (HCC)     Past Surgical History:  Procedure Laterality Date  . CARDIAC CATHETERIZATION  12/2006   [name] 01/18/2011  . INGUINAL HERNIA REPAIR Right   . LEFT HEART CATH AND CORONARY ANGIOGRAPHY N/A 07/11/2017   Procedure: LEFT HEART CATH AND CORONARY ANGIOGRAPHY;  Surgeon: Tonny Bollman, MD;  Location: [facility] INVASIVE CV LAB;  Service: Cardiovascular;  Laterality: N/A;  . PARATHYROIDECTOMY    . PERICARDIAL TAP  2000   ?; in [place]/notes 01/18/2011   Family History:  Family History  Problem Relation Age of Onset  . Hypertension Mother   . Diabetes Mother   . Heart disease Father   . Hypertension Sister   . Diabetes Sister   . Hypertension Brother   . Diabetes Brother   . Hypertension Sister   . Diabetes Sister   . Hypertension Brother   . Diabetes Brother    Family Psychiatric  History: See H&P.  Social History:  Social History   Substance and Sexual Activity  Alcohol Use Yes   Comment: 06/12/2017 "stopped ~ 1 yr ago"      Social History   Substance and Sexual Activity  Drug Use Yes  . Types: Cocaine   Comment: 06/12/2017 "stopped using cocaine ~ 1 yr ago; used marijuana when I was younger" last cocaine use 9 mths ago per pt  on 06/15/17    Social History   Socioeconomic History  . Marital status: Divorced    Spouse name: None  . Number of children: None  . Years of education: None  . Highest education level: None  Social Needs  . Financial resource strain: None  . Food insecurity - worry: None  . Food insecurity - inability: None  . Transportation needs - medical: None  . Transportation needs - non-medical: None  Occupational History  . None  Tobacco Use  . Smoking  status: Current Every Day Smoker    Packs/day: 0.50    Years: 38.00    Pack years: 19.00    Types: Cigarettes  . Smokeless tobacco: Current User    Types: Chew  Substance and Sexual Activity  . Alcohol use: Yes    Comment: 06/12/2017 "stopped ~ 1 yr ago"   . Drug use: Yes    Types: Cocaine    Comment: 06/12/2017 "stopped using cocaine ~ 1 yr ago; used marijuana when I was younger" last cocaine use 9 mths ago per pt on 06/15/17  . Sexual activity: Not Currently  Other Topics Concern  . None  Social History Narrative  . None   Hospital Course: (Per Md's SRA): Jonus Coble is a 57 y/o M with history of bipolar disorder with psychotic features and polysubstance abuse who was admitted voluntarily with worsening depression, AH, and SI with plan to jump from a cliff. Pt had 3 ED presentations within the past week. He also reported using cocaine, opiates, and THC. Pt has recent history of discharge from [facility] in October 2018 to outpatient level of care.Pt was restarted on previous outpatient medicationsof gabapentin, zoloft, and risperdal. His doses were titrated up during his stay,and he has been reportingincrementalimprovement of his presenting symptoms.  This is one of Roldan's many discharge summaries from this psychiatric hospital. He was discharged from the [facility] on 06-26-17 after mood stabilization treatments. He was discharged then to follow-up care at the [facility] in [facility], [place] for routine psychiatric care & medication maangement. He returned back to the hospital with complaints of worsening depression, increased drug use, hallucinations & suicidal thoughts with plans. He was re-admitted for mood stabilization treatment.   After his admission assessment, Dillard's presenting symptoms were identified. The medication regimen needed to treat his symptoms were discussed & initiated targeting those presenting symptoms. He was oriented to the unit and encouraged to participate in the unit programming. His  other pre-existing medical problems were identified & treated by resuming his pertinent home medication for those health issues. He received & was discharge on; Gabapentin 300 mg for agitation, Hydroxyzine 50 mg prn for anxiety, Nicotine patch 14 mg for smoking cessation, Risperdal 1 mg & 2 mg respectively for mood control, Sertraline 50 mg for depression Trazodone 100 mg for insomnia. Koben tolerated his treatment regimen without any adverse effects or reactions reported.        Today upon his discharge evaluation with the attending psychiatrist, pt is bright and smiling, and he shares, "It's going pretty good." He shares that he is looking forward to discharging today and he has plans to contact both of the boarding houses he has been in contact with about securing a room after he receives funds from his brother. Pt reports his mood is doing well today. He denies SI today. He denies HI today though he had reported them when he first arrived, and he comments, "I'm not going to jail for that lady." Pt  reports some ongoing AH but describes them as minimal and non-intrusive. He denies VH. He feels that his medications are helpful and he would like to continue them without changes. He was able to engage in safety planning including plan to return to [facility] or contact emergency services if he feels unable to maintain his own safety or the safety of others. Pt had no further questions, comments, or concerns.  Shonta is currently being discharged to follow-up care on an outpatient basis as noted below. He is provided with all the necessary information needed to make these appointments without problems.  He left [facility] in no apparent distress with all personal belongings. Transportation per city bus. [facility] assisted with bus pass.  Physical Findings: AIMS: Facial and Oral Movements Muscles of Facial Expression: None, normal Lips and Perioral Area: None, normal Jaw: None, normal Tongue: None, normal,Extremity  Movements Upper (arms, wrists, hands, fingers): None, normal Lower (legs, knees, ankles, toes): None, normal, Trunk Movements Neck, shoulders, hips: None, normal, Overall Severity Severity of abnormal movements (highest score from questions above): None, normal Incapacitation due to abnormal movements: None, normal Patient's awareness of abnormal movements (rate only patient's report): No Awareness, Dental Status Current problems with teeth and/or dentures?: No Does patient usually wear dentures?: No  CIWA:    COWS:     Musculoskeletal: Strength & Muscle Tone: within normal limits Gait & Station: normal Patient leans: N/A  Psychiatric Specialty Exam: Physical Exam  Constitutional: He appears well-developed.  HENT:  Head: Normocephalic.  Eyes: Pupils are equal, round, and reactive to light.  Neck: Normal range of motion.  Cardiovascular: Normal rate.  Respiratory: Effort normal.  GI: Soft.  Genitourinary:  Genitourinary Comments: Deferred  Musculoskeletal: Normal range of motion.  Neurological: He is alert.  Skin: Skin is warm.    Review of Systems  Constitutional: Negative.   HENT: Negative.   Eyes: Negative.   Respiratory: Negative.   Cardiovascular: Negative.   Gastrointestinal: Negative.   Genitourinary: Negative.   Musculoskeletal: Negative.   Skin: Negative.   Neurological: Negative.   Endo/Heme/Allergies: Negative.   Psychiatric/Behavioral: Positive for depression (Stabilized with medication prior to discharge), hallucinations (Hx. auditory hallucinations) and substance abuse ( Hx. opioid use disorder). Negative for memory loss and suicidal ideas. The patient has insomnia (Stabilized with medication prior to discharge). The patient is not nervous/anxious.     Blood pressure (!) 94/54, pulse 78, temperature 98.8 F (37.1 C), temperature source Oral, resp. rate 20, height 6\' 2"  (1.88 m), weight 114.3 kg (252 lb).Body mass index is 32.35 kg/m.  See H&P   Have  you used any form of tobacco in the last 30 days? (Cigarettes, Smokeless Tobacco, Cigars, and/or Pipes): Yes  Has this patient used any form of tobacco in the last 30 days? (Cigarettes, Smokeless Tobacco, Cigars, and/or Pipes):Yes, recommended Nicotine patch 14 mg for smoking cessation.  Blood Alcohol level:  Lab Results  Component Value Date   ETH <10 10/07/2017   ETH <10 07/07/2017   Metabolic Disorder Labs:  Lab Results  Component Value Date   HGBA1C 10.0 (H) 07/09/2017   MPG 240.3 07/09/2017   MPG 225.95 06/10/2017   No results found for: PROLACTIN Lab Results  Component Value Date   CHOL 85 06/10/2017   TRIG 101 06/10/2017   HDL 22 (L) 06/10/2017   CHOLHDL 3.9 06/10/2017   VLDL 20 06/10/2017   LDLCALC 43 06/10/2017   See Psychiatric Specialty Exam and Suicide Risk Assessment completed by Attending Physician prior to  discharge.  Discharge destination:  Home  Is patient on multiple antipsychotic therapies at discharge:  No   Has Patient had three or more failed trials of antipsychotic monotherapy by history:  No  Recommended Plan for Multiple Antipsychotic Therapies: NA  Allergies as of 10/18/2017   No Known Allergies     Medication List    STOP taking these medications   aspirin 81 MG EC tablet   dicyclomine 20 MG tablet Commonly known as:  BENTYL   lidocaine 5 % Commonly known as:  LIDODERM   meclizine 25 MG tablet Commonly known as:  ANTIVERT   multivitamin with minerals Tabs tablet   nitroGLYCERIN 0.4 MG SL tablet Commonly known as:  NITROSTAT   ranitidine 150 MG tablet Commonly known as:  ZANTAC     TAKE these medications     Indication  atorvastatin 40 MG tablet Commonly known as:  LIPITOR Take 1 tablet (40 mg total) by mouth daily. For high Cholesterol  Indication:  Inherited Homozygous Hypercholesterolemia, Elevation of Both Cholesterol and Triglycerides in Blood   gabapentin 300 MG capsule Commonly known as:  NEURONTIN Take 1 capsule  (300 mg total) by mouth 3 (three) times daily. For agitation What changed:  when to take this  Indication:  Agitation   hydrochlorothiazide 25 MG tablet Commonly known as:  HYDRODIURIL Take 1 tablet (25 mg total) by mouth daily. For high blood pressure  Indication:  High Blood Pressure Disorder   hydrOXYzine 50 MG tablet Commonly known as:  ATARAX/VISTARIL Take 1 tablet (50 mg total) by mouth every 6 (six) hours as needed for anxiety. What changed:    medication strength  how much to take  when to take this  Indication:  Feeling Anxious   insulin aspart protamine- aspart (70-30) 100 UNIT/ML injection Commonly known as:  NOVOLOG MIX 70/30 Inject 0.45 mLs (45 Units total) into the skin daily with supper. For diabetes management What changed:  You were already taking a medication with the same name, and this prescription was added. Make sure you understand how and when to take each.  Indication:  Type 2 Diabetes   insulin aspart protamine- aspart (70-30) 100 UNIT/ML injection Commonly known as:  NOVOLOG MIX 70/30 Inject 0.6 mLs (60 Units total) into the skin daily with breakfast. For diabetes management What changed:    how much to take  when to take this  additional instructions  Indication:  Type 2 Diabetes   metoprolol tartrate 25 MG tablet Commonly known as:  LOPRESSOR Take 1 tablet (25 mg total) by mouth 2 (two) times daily. For high blood pressure  Indication:  High Blood Pressure Disorder   nicotine 14 mg/24hr patch Commonly known as:  NICODERM CQ - dosed in mg/24 hours Place 1 patch (14 mg total) onto the skin daily. (May purchase over the counter): For smoking cessation  Indication:  Nicotine Addiction   risperiDONE 1 MG tablet Commonly known as:  RISPERDAL Take 1 tablet (1 mg) by mouth in the mornings & 2 tablets (2 mg) at bedtime: For mood control What changed:    how much to take  how to take this  when to take this  additional instructions   Indication:  Mood control   sertraline 100 MG tablet Commonly known as:  ZOLOFT Take 1 tablet (100 mg total) by mouth daily. For depression What changed:    medication strength  how much to take  Indication:  Major Depressive Disorder   traZODone 100 MG tablet  Commonly known as:  DESYREL Take 1 tablet (100 mg total) by mouth at bedtime as needed for sleep.  Indication:  Trouble Sleeping      Follow-up [employer]. Go on 10/23/2017.   Specialty:  Behavioral Health Why:  Please attend your follow up appt on Tuesday 2/19 at 8:00AM.  Please bring photo ID, copy of insurance card, and your hospital discharge paperwork. [address] [place] [address] [phone]          Follow-up recommendations: Activity:  As tolerated Diet: As recommended by your primary care doctor. Keep all scheduled follow-up appointments as recommended.  Comments: Patient is instructed prior to discharge to: Take all medications as prescribed by his/her mental healthcare provider. Report any adverse effects and or reactions from the medicines to his/her outpatient provider promptly. Patient has been instructed & cautioned: To not engage in alcohol and or illegal drug use while on prescription medicines. In the event of worsening symptoms, patient is instructed to call the crisis hotline, 911 and or go to the nearest ED for appropriate evaluation and treatment of symptoms. To follow-up with his/her primary care provider for your other medical issues, concerns and or health care needs.   Signed: Armandina Stammer, NP, PMHNP, FNP-BC 10/18/2017, 9:39 AM   Patient seen, Suicide Assessment Completed.  Disposition Plan Reviewed

## 2017-10-16 NOTE — Progress Notes (Signed)
[facility] MD Progress Note  10/16/2017 3:08 PM Charles Orr  MRN:  [id number] Subjective:    Charles Orr is a 57 y/o M with history of bipolar disorder with psychotic features and polysubstance abuse who was admitted voluntarily with worsening depression, AH, and SI with plan to jump from a cliff. Pt had 3 ED presentations within the past week. He also reported using cocaine, opiates, and THC. Pt has recent history of discharge from [facility] in October 2018 to outpatient level of care.Pt was restarted on previous outpatient medicationsof gabapentin, zoloft, and risperdal. His doses were titrated up during his stay, and he has been reporting incremental improvement of his presenting symptoms.  Today upon evaluation, pt shares, "I'm here." He notes that his mood is somewhat improved, but he continues to have some intermittent thoughts of SI without specific plan and HI towards "Charles Orr" who is the manager at his previous boarding home. He reports AH have improved, and he comments, "They are there but not as loud." He denies VH. He is sleeping well and his appetite is good. He has no physical complaints today. He notes that his anxiety is doing better today with increased dose of vistaril from yesterday. He would like to continue his current medication regimen. Pt feels that if he were to discharge today we would likely act on his HI and he does not feel able to contract for safety.  Principal Problem: Bipolar disorder, curr episode mixed, severe, with psychotic features (HCC) Diagnosis:   Patient Active Problem List   Diagnosis Date Noted  . Bipolar disorder, curr episode mixed, severe, with psychotic features (HCC) [F31.64] 10/08/2017  . Polysubstance (including opioids) dependence with physiological dependence (HCC) [F19.20] 10/06/2017  . 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 Chest pain [R07.9] 07/10/2017  . Angina pectoris (HCC) [I20.9]  07/09/2017  . Tobacco abuse [Z72.0] 07/09/2017  . Hypertension [I10] 07/09/2017  . Type II diabetes mellitus (HCC) [E11.9]   . GERD (gastroesophageal reflux disease) [K21.9]   . Chronic lower back pain [M54.5, G89.29]   . Poorly controlled diabetes mellitus (HCC) [E11.65]   . Hyperlipidemia with target LDL less than 70 [E78.5]   . Schizoaffective disorder, bipolar type (HCC) [F25.0] 06/16/2017  . Depression [F32.9] 06/15/2017  . Other schizophrenia (HCC) [F20.89]   . Pneumonia [J18.9] 06/11/2017  . Community acquired pneumonia of right lower lobe of lung (HCC) [J18.1]   . Hyperglycemia [R73.9]   . Localized edema [R60.0]   . Shortness of breath [R06.02] 06/10/2017   Total Time spent with patient: 30 minutes  Past Psychiatric History: see H&P  Past Medical History:  Past Medical History:  Diagnosis Date  . Anxiety   . Arthritis    "back" (06/12/2017)  . Bipolar disorder (HCC)   . CAP (community acquired pneumonia) 06/10/2017  . Chronic lower back pain   . Degenerative disc disease, lumbar   . Depression   . Fibromyalgia   . GERD (gastroesophageal reflux disease)   . High cholesterol   . Hypertension   . Myocardial infarction (HCC) 2002  . Pneumonia 1989  . Schizophrenia (HCC)   . Type II diabetes mellitus (HCC)     Past Surgical History:  Procedure Laterality Date  . CARDIAC CATHETERIZATION  12/2006   [name] 01/18/2011  . INGUINAL HERNIA REPAIR Right   . LEFT HEART CATH AND CORONARY ANGIOGRAPHY N/A 07/11/2017   Procedure: LEFT HEART CATH AND CORONARY ANGIOGRAPHY;  Surgeon: Tonny Bollman, MD;  Location:  [facility];  Service: Cardiovascular;  Laterality: N/A;  . PARATHYROIDECTOMY    . PERICARDIAL TAP  2000   ?; in [place]/notes 01/18/2011   Family History:  Family History  Problem Relation Age of Onset  . Hypertension Mother   . Diabetes Mother   . Heart disease Father   . Hypertension Sister   . Diabetes Sister   . Hypertension Brother   . Diabetes  Brother   . Hypertension Sister   . Diabetes Sister   . Hypertension Brother   . Diabetes Brother    Family Psychiatric  History: see H&P Social History:  Social History   Substance and Sexual Activity  Alcohol Use Yes   Comment: 06/12/2017 "stopped ~ 1 yr ago"      Social History   Substance and Sexual Activity  Drug Use Yes  . Types: Cocaine   Comment: 06/12/2017 "stopped using cocaine ~ 1 yr ago; used marijuana when I was younger" last cocaine use 9 mths ago per pt on 06/15/17    Social History   Socioeconomic History  . Marital status: Divorced    Spouse name: None  . Number of children: None  . Years of education: None  . Highest education level: None  Social Needs  . Financial resource strain: None  . Food insecurity - worry: None  . Food insecurity - inability: None  . Transportation needs - medical: None  . Transportation needs - non-medical: None  Occupational History  . None  Tobacco Use  . Smoking status: Current Every Day Smoker    Packs/day: 0.50    Years: 38.00    Pack years: 19.00    Types: Cigarettes  . Smokeless tobacco: Current User    Types: Chew  Substance and Sexual Activity  . Alcohol use: Yes    Comment: 06/12/2017 "stopped ~ 1 yr ago"   . Drug use: Yes    Types: Cocaine    Comment: 06/12/2017 "stopped using cocaine ~ 1 yr ago; used marijuana when I was younger" last cocaine use 9 mths ago per pt on 06/15/17  . Sexual activity: Not Currently  Other Topics Concern  . None  Social History Narrative  . None   Additional Social History:                         Sleep: Good  Appetite:  Fair  Current Medications: Current Facility-Administered Medications  Medication Dose Route Frequency Provider Last Rate Last Dose  . alum & mag hydroxide-simeth (MAALOX/MYLANTA) 200-200-20 MG/5ML suspension 30 mL  30 mL Oral Q4H PRN Fransisca Kaufmann A, NP   30 mL at 10/09/17 0645  . atorvastatin (LIPITOR) tablet 40 mg  40 mg Oral Daily Fransisca Kaufmann  A, NP   40 mg at 10/16/17 0749  . gabapentin (NEURONTIN) capsule 300 mg  300 mg Oral TID Micheal Likens, MD   300 mg at 10/16/17 1032  . hydrochlorothiazide (HYDRODIURIL) tablet 25 mg  25 mg Oral Daily Fransisca Kaufmann A, NP   25 mg at 10/16/17 0750  . hydrOXYzine (ATARAX/VISTARIL) tablet 50 mg  50 mg Oral Q6H PRN Micheal Likens, MD   50 mg at 10/16/17 1414  . ibuprofen (ADVIL,MOTRIN) tablet 600 mg  600 mg Oral Q6H PRN Micheal Likens, MD   600 mg at 10/16/17 1032  . insulin aspart (novoLOG) injection 0-15 Units  0-15 Units Subcutaneous TID WC Jackelyn Poling, NP   5 Units  at 10/16/17 1203  . insulin aspart protamine- aspart (NOVOLOG MIX 70/30) injection 60 Units  60 Units Subcutaneous Q breakfast [name], [name], NP   60 Units at 10/16/17 0749   And  . insulin aspart protamine- aspart (NOVOLOG MIX 70/30) injection 45 Units  45 Units Subcutaneous Q supper [name], [name], NP   45 Units at 10/15/17 1719  . magnesium hydroxide (MILK OF MAGNESIA) suspension 30 mL  30 mL Oral Daily PRN [name], [name], NP      . metoprolol tartrate (LOPRESSOR) tablet 25 mg  25 mg Oral BID [name], [name], NP   25 mg at 10/16/17 0750  . nicotine (NICODERM CQ - dosed in mg/24 hours) patch 14 mg  14 mg Transdermal Daily [name], [name], NP   14 mg at 10/16/17 0750  . risperiDONE (RISPERDAL) tablet 1 mg  1 mg Oral BH-q7a Cobos, Rockey SituFernando A, MD   1 mg at 10/16/17 [id number]  . risperiDONE (RISPERDAL) tablet 2 mg  2 mg Oral QHS Micheal Likensainville, Camilo Mander T, MD   2 mg at 10/15/17 2107  . sertraline (ZOLOFT) tablet 100 mg  100 mg Oral Daily Leata MouseJonnalagadda, Janardhana, MD   100 mg at 10/16/17 0750  . traZODone (DESYREL) tablet 100 mg  100 mg Oral QHS PRN Nira ConnBerry, Jason A, NP   100 mg at 10/15/17 2107    Lab Results:  Results for orders placed or performed during the hospital encounter of 10/08/17 (from the past 48 hour(s))  Glucose, capillary     Status: Abnormal   Collection Time: 10/14/17  4:58 PM  Result Value Ref  Range   Glucose-Capillary 304 (H) 65 - 99 mg/dL  Glucose, capillary     Status: Abnormal   Collection Time: 10/14/17  7:54 PM  Result Value Ref Range   Glucose-Capillary 352 (H) 65 - 99 mg/dL  Glucose, capillary     Status: Abnormal   Collection Time: 10/15/17  6:15 AM  Result Value Ref Range   Glucose-Capillary 176 (H) 65 - 99 mg/dL  Glucose, capillary     Status: Abnormal   Collection Time: 10/15/17 11:57 AM  Result Value Ref Range   Glucose-Capillary 203 (H) 65 - 99 mg/dL  Glucose, capillary     Status: Abnormal   Collection Time: 10/15/17  4:55 PM  Result Value Ref Range   Glucose-Capillary 363 (H) 65 - 99 mg/dL   Comment 1 Notify RN    Comment 2 Document in Chart   Glucose, capillary     Status: Abnormal   Collection Time: 10/15/17  7:37 PM  Result Value Ref Range   Glucose-Capillary 240 (H) 65 - 99 mg/dL  Glucose, capillary     Status: Abnormal   Collection Time: 10/16/17  6:02 AM  Result Value Ref Range   Glucose-Capillary 156 (H) 65 - 99 mg/dL  Glucose, capillary     Status: Abnormal   Collection Time: 10/16/17 11:51 AM  Result Value Ref Range   Glucose-Capillary 246 (H) 65 - 99 mg/dL   Comment 1 Notify RN     Blood Alcohol level:  Lab Results  Component Value Date   ETH <10 10/07/2017   ETH <10 07/07/2017    Metabolic Disorder Labs: Lab Results  Component Value Date   HGBA1C 10.0 (H) 07/09/2017   MPG 240.3 07/09/2017   MPG 225.95 06/10/2017   No results found for: PROLACTIN Lab Results  Component Value Date   CHOL 85 06/10/2017   TRIG 101 06/10/2017  HDL 22 (L) 06/10/2017   CHOLHDL 3.9 06/10/2017   VLDL 20 06/10/2017   LDLCALC 43 06/10/2017    Physical Findings: AIMS: Facial and Oral Movements Muscles of Facial Expression: None, normal Lips and Perioral Area: None, normal Jaw: None, normal Tongue: None, normal,Extremity Movements Upper (arms, wrists, hands, fingers): None, normal Lower (legs, knees, ankles, toes): None, normal, Trunk  Movements Neck, shoulders, hips: None, normal, Overall Severity Severity of abnormal movements (highest score from questions above): None, normal Incapacitation due to abnormal movements: None, normal Patient's awareness of abnormal movements (rate only patient's report): No Awareness, Dental Status Current problems with teeth and/or dentures?: No Does patient usually wear dentures?: No  CIWA:    COWS:     Musculoskeletal: Strength & Muscle Tone: within normal limits Gait & Station: normal Patient leans: N/A  Psychiatric Specialty Exam: Physical Exam  Nursing note and vitals reviewed.   Review of Systems  Constitutional: Negative for chills and fever.  Respiratory: Negative for cough.   Cardiovascular: Negative for chest pain.  Gastrointestinal: Negative for abdominal pain, heartburn, nausea and vomiting.  Psychiatric/Behavioral: Positive for depression, hallucinations and suicidal ideas. The patient is nervous/anxious. The patient does not have insomnia.     Blood pressure 105/67, pulse 85, temperature 98.8 F (37.1 C), temperature source Oral, resp. rate 20, height 6\' 2"  (1.88 m), weight 114.3 kg (252 lb).Body mass index is 32.35 kg/m.  General Appearance: Casual and Fairly Groomed  Eye Contact:  Good  Speech:  Clear and Coherent and Normal Rate  Volume:  Normal  Mood:  Anxious and Depressed  Affect:  Congruent and Constricted  Thought Process:  Coherent and Goal Directed  Orientation:  Full (Time, Place, and Person)  Thought Content:  Hallucinations: Auditory  Suicidal Thoughts:  Yes.  without intent/plan  Homicidal Thoughts:  Yes.  with intent/plan  Memory:  Immediate;   Fair Recent;   Fair Remote;   Fair  Judgement:  Poor  Insight:  Lacking  Psychomotor Activity:  Normal  Concentration:  Concentration: Fair  Recall:  [employer] of Knowledge:  Fair  Language:  Fair  Akathisia:  No  Handed:    AIMS (if indicated):     Assets:  Communication Skills Leisure  Time Physical Health Resilience Social Support  ADL's:  Intact  Cognition:  WNL  Sleep:  Number of Hours: 6.75     Treatment Plan Summary: Daily contact with patient to assess and evaluate symptoms and progress in treatment and Medication management. Pt reports some improvement of his symptoms overall, but he continues to endorse SI, HI, and AH. He agrees to continue his current medication regimen, and he agrees to work on safety planning so that we can plan to discharge to a boarding house in the coming days.  -Continue inpatient hospitalization  -Bipolar I, current episode mixed, with psychotic features -Continuerisperdal 1mg  qAM + 2mg  po qhs -Continuezoloft 100mg  po qDay  - Anxiety -Continuegabapentin 300mg  po TID -Continue atarax 50mg  po q6h prn anxiety  - DMII - ContinueNovolog  -HLD - Continue lipitor  -HTN - Continue HCTZ and lopressor  - Insomnia - Continue trazodone 50mg  po qhs prn insomnia  -Encourage participation in groups and the therapeutic milieu  -Discharge planning will be ongoing   Micheal Likens, MD 10/16/2017, 3:08 PM

## 2017-10-16 NOTE — Progress Notes (Signed)
  D: When asked about his day pt stated, "pretty good". Stated that he's still seeing/hearing things "but not as bad as it was". Pt also stated that his HI "comes and goes". When asked who he was HI against, pt stated, "my landlord", but he wouldn't go into detail. Informed the writer that he's "supposed to be going home tomorrow". When asked what his plans were, stated, "to go to my house".   A:  Support and encouragement was offered. 15 min checks continued for safety.  R: Pt remains safe.

## 2017-10-16 NOTE — Progress Notes (Addendum)
  DATA ACTION RESPONSE  Objective- Pt. is visible in the dayroom, seen watching TV.Presents with a depressed affect and mood. C/o of pain, anxiety, and insomnia this evening. Pleasant on the unit.CBG was elevated at bedtime.   Subjective- Denies having any HI/VH at this time. Rates pain 8/10; generalized.+AH. Endorses passive SI but verbal contracts for safety. Is cooperative and remain safe on the unit.  1:1 interaction in private to establish rapport. Encouragement, education, & support given from staff.  PRN Ibuprofen, Vistaril, and trazodone requested and will re-eval accordingly.   Safety maintained with Q 15 checks. Continue with POC.

## 2017-10-17 LAB — GLUCOSE, CAPILLARY
GLUCOSE-CAPILLARY: 391 mg/dL — AB (ref 65–99)
GLUCOSE-CAPILLARY: 279 mg/dL — AB (ref 65–99)
Glucose-Capillary: 166 mg/dL — ABNORMAL HIGH (ref 65–99)
Glucose-Capillary: 197 mg/dL — ABNORMAL HIGH (ref 65–99)

## 2017-10-17 NOTE — Progress Notes (Signed)
  DATA ACTION RESPONSE  Objective- Pt. is visible in the dayroom, seen watching TV.Presents with a depressed affect and mood. C/o of pain, anxiety, and insomnia this evening. Pleasant on the unit.Pt states he is anxious but exicted for discharge.  Subjective- Denies having any HI/VH at this time. Rates pain 8/10; generalized.+AH. Endorses passive SI but verbal contracts for safety. Is cooperative and remain safe on the unit.  1:1 interaction in private to establish rapport. Encouragement, education, & support given from staff.  PRN Ibuprofen, Vistaril, and trazodone requested and will re-eval accordingly.   Safety maintained with Q 15 checks. Continue with POC.

## 2017-10-17 NOTE — Progress Notes (Signed)
Patient denies SI, HI and AVH this shift.  Patient was compliant with medications and discussed discharge tomorrow. Patient has had no issues of behavioral dyscontrol.   Assess patient for safety, offer medications as prescribed, engage patient in 1:1 staff talks.   Patient able to contract for safety.  Continue to monitor as planned.

## 2017-10-17 NOTE — Progress Notes (Signed)
[facility] MD Progress Note  10/17/2017 3:40 PM NIKESH TESCHNER  MRN:  [id number]  Subjective: Cristan reports, "I'm doing pretty well today. My mood is better. I will be going home tomorrow. My brother is helping me with finances after discharge. He will wiring me some money to secure a place after live after discharge. The boarding house remains my choice".   Objective: Taijuan Serviss is a 57 y/o M with history of bipolar disorder with psychotic features and polysubstance abuse who was admitted voluntarily with worsening depression, AH, and SI with plan to jump from a cliff. Pt had 3 ED presentations within the past week. He also reported using cocaine, opiates, and THC. Pt has recent history of discharge from [facility] in October 2018 to outpatient level of care.Pt was restarted on previous outpatient medicationsof gabapentin, zoloft, and risperdal. His doses were titrated up during his stay, and he has been reporting incremental improvement of his presenting symptoms.  Today 10-17-17, upon evaluation, Patient is seen, chart reviewed. The chart findings discussed with the treatment team. Patient reports improved mood, but he continues to have some AH, but not as intense, rather improved. "They are there but not as loud." He denies VH. He is sleeping well and his appetite is good. He has no physical complaints today. He notes that his anxiety is doing better today with increased dose of vistaril 2 days ago. He would like to continue his current medication regimen. He says he is ready to be discharged in the morning. He says his brother is helping him with the money to secure a place to live after discharge. He presents stable today.  Principal Problem: Bipolar disorder, curr episode mixed, severe, with psychotic features (HCC) Diagnosis:   Patient Active Problem List   Diagnosis Date Noted  . Bipolar disorder, curr episode mixed, severe, with psychotic features (HCC) [F31.64] 10/08/2017  . Polysubstance (including  opioids) dependence with physiological dependence (HCC) [F19.20] 10/06/2017  . 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 Chest pain [R07.9] 07/10/2017  . Angina pectoris (HCC) [I20.9] 07/09/2017  . Tobacco abuse [Z72.0] 07/09/2017  . Hypertension [I10] 07/09/2017  . Type II diabetes mellitus (HCC) [E11.9]   . GERD (gastroesophageal reflux disease) [K21.9]   . Chronic lower back pain [M54.5, G89.29]   . Poorly controlled diabetes mellitus (HCC) [E11.65]   . Hyperlipidemia with target LDL less than 70 [E78.5]   . Schizoaffective disorder, bipolar type (HCC) [F25.0] 06/16/2017  . Depression [F32.9] 06/15/2017  . Other schizophrenia (HCC) [F20.89]   . Pneumonia [J18.9] 06/11/2017  . Community acquired pneumonia of right lower lobe of lung (HCC) [J18.1]   . Hyperglycemia [R73.9]   . Localized edema [R60.0]   . Shortness of breath [R06.02] 06/10/2017   Total Time spent with patient: 15 minutes  Past Psychiatric History: See H&P  Past Medical History:  Past Medical History:  Diagnosis Date  . Anxiety   . Arthritis    "back" (06/12/2017)  . Bipolar disorder (HCC)   . CAP (community acquired pneumonia) 06/10/2017  . Chronic lower back pain   . Degenerative disc disease, lumbar   . Depression   . Fibromyalgia   . GERD (gastroesophageal reflux disease)   . High cholesterol   . Hypertension   . Myocardial infarction (HCC) 2002  . Pneumonia 1989  . Schizophrenia (HCC)   . Type II diabetes mellitus (HCC)     Past Surgical History:  Procedure Laterality Date  . CARDIAC  CATHETERIZATION  12/2006   [name]/notes 01/18/2011  . INGUINAL HERNIA REPAIR Right   . LEFT HEART CATH AND CORONARY ANGIOGRAPHY N/A 07/11/2017   Procedure: LEFT HEART CATH AND CORONARY ANGIOGRAPHY;  Surgeon: Tonny Bollmanooper, Michael, MD;  [facility];  Service: Cardiovascular;  Laterality: N/A;  . PARATHYROIDECTOMY    . PERICARDIAL TAP  2000   ?; in  [place]/notes 01/18/2011   Family History:  Family History  Problem Relation Age of Onset  . Hypertension Mother   . Diabetes Mother   . Heart disease Father   . Hypertension Sister   . Diabetes Sister   . Hypertension Brother   . Diabetes Brother   . Hypertension Sister   . Diabetes Sister   . Hypertension Brother   . Diabetes Brother    Family Psychiatric  History: See H&P  Social History:  Social History   Substance and Sexual Activity  Alcohol Use Yes   Comment: 06/12/2017 "stopped ~ 1 yr ago"      Social History   Substance and Sexual Activity  Drug Use Yes  . Types: Cocaine   Comment: 06/12/2017 "stopped using cocaine ~ 1 yr ago; used marijuana when I was younger" last cocaine use 9 mths ago per pt on 06/15/17    Social History   Socioeconomic History  . Marital status: Divorced    Spouse name: None  . Number of children: None  . Years of education: None  . Highest education level: None  Social Needs  . Financial resource strain: None  . Food insecurity - worry: None  . Food insecurity - inability: None  . Transportation needs - medical: None  . Transportation needs - non-medical: None  Occupational History  . None  Tobacco Use  . Smoking status: Current Every Day Smoker    Packs/day: 0.50    Years: 38.00    Pack years: 19.00    Types: Cigarettes  . Smokeless tobacco: Current User    Types: Chew  Substance and Sexual Activity  . Alcohol use: Yes    Comment: 06/12/2017 "stopped ~ 1 yr ago"   . Drug use: Yes    Types: Cocaine    Comment: 06/12/2017 "stopped using cocaine ~ 1 yr ago; used marijuana when I was younger" last cocaine use 9 mths ago per pt on 06/15/17  . Sexual activity: Not Currently  Other Topics Concern  . None  Social History Narrative  . None   Additional Social History:   Sleep: Good  Appetite:  Fair  Current Medications: Current Facility-Administered Medications  Medication Dose Route Frequency Provider Last Rate Last  Dose  . alum & mag hydroxide-simeth (MAALOX/MYLANTA) 200-200-20 MG/5ML suspension 30 mL  30 mL Oral Q4H PRN [name], [name], NP   30 mL at 10/09/17 0645  . atorvastatin (LIPITOR) tablet 40 mg  40 mg Oral Daily [name], [name], NP   40 mg at 10/17/17 0802  . gabapentin (NEURONTIN) capsule 300 mg  300 mg Oral TID [name], Christopher T, MD   300 mg at 10/17/17 1146  . hydrochlorothiazide (HYDRODIURIL) tablet 25 mg  25 mg Oral Daily [name], [name], NP   25 mg at 10/17/17 0803  . hydrOXYzine (ATARAX/VISTARIL) tablet 50 mg  50 mg Oral Q6H PRN Micheal Likensainville, Christopher T, MD   50 mg at 10/17/17 1423  . ibuprofen (ADVIL,MOTRIN) tablet 600 mg  600 mg Oral Q6H PRN Micheal Likensainville, Christopher T, MD   600 mg at 10/17/17 1423  . insulin  aspart (novoLOG) injection 0-15 Units  0-15 Units Subcutaneous TID WC Nira Conn A, NP   3 Units at 10/17/17 1145  . insulin aspart protamine- aspart (NOVOLOG MIX 70/30) injection 60 Units  60 Units Subcutaneous Q breakfast Fransisca Kaufmann A, NP   60 Units at 10/17/17 0801   And  . insulin aspart protamine- aspart (NOVOLOG MIX 70/30) injection 45 Units  45 Units Subcutaneous Q supper Thermon Leyland, NP   45 Units at 10/16/17 1704  . magnesium hydroxide (MILK OF MAGNESIA) suspension 30 mL  30 mL Oral Daily PRN Fransisca Kaufmann A, NP      . metoprolol tartrate (LOPRESSOR) tablet 25 mg  25 mg Oral BID Fransisca Kaufmann A, NP   25 mg at 10/17/17 0803  . nicotine (NICODERM CQ - dosed in mg/24 hours) patch 14 mg  14 mg Transdermal Daily Fransisca Kaufmann A, NP   14 mg at 10/17/17 0803  . risperiDONE (RISPERDAL) tablet 1 mg  1 mg Oral BH-q7a Cobos, Rockey Situ, MD   1 mg at 10/17/17 0630  . risperiDONE (RISPERDAL) tablet 2 mg  2 mg Oral QHS Micheal Likens, MD   2 mg at 10/16/17 2106  . sertraline (ZOLOFT) tablet 100 mg  100 mg Oral Daily Leata Mouse, MD   100 mg at 10/17/17 0802  . traZODone (DESYREL) tablet 100 mg  100 mg Oral QHS PRN Nira Conn A, NP   100 mg at 10/16/17 2105    Lab Results:  Results for orders placed or performed during the hospital encounter of 10/08/17 (from the past 48 hour(s))  Glucose, capillary     Status: Abnormal   Collection Time: 10/15/17  4:55 PM  Result Value Ref Range   Glucose-Capillary 363 (H) 65 - 99 mg/dL   Comment 1 Notify RN    Comment 2 Document in Chart   Glucose, capillary     Status: Abnormal   Collection Time: 10/15/17  7:37 PM  Result Value Ref Range   Glucose-Capillary 240 (H) 65 - 99 mg/dL  Glucose, capillary     Status: Abnormal   Collection Time: 10/16/17  6:02 AM  Result Value Ref Range   Glucose-Capillary 156 (H) 65 - 99 mg/dL  Glucose, capillary     Status: Abnormal   Collection Time: 10/16/17 11:51 AM  Result Value Ref Range   Glucose-Capillary 246 (H) 65 - 99 mg/dL   Comment 1 Notify RN   Glucose, capillary     Status: Abnormal   Collection Time: 10/16/17  4:56 PM  Result Value Ref Range   Glucose-Capillary 322 (H) 65 - 99 mg/dL  Glucose, capillary     Status: Abnormal   Collection Time: 10/16/17  8:26 PM  Result Value Ref Range   Glucose-Capillary 348 (H) 65 - 99 mg/dL   Comment 1 Notify RN   Glucose, capillary     Status: Abnormal   Collection Time: 10/17/17  6:17 AM  Result Value Ref Range   Glucose-Capillary 166 (H) 65 - 99 mg/dL  Glucose, capillary     Status: Abnormal   Collection Time: 10/17/17 11:27 AM  Result Value Ref Range   Glucose-Capillary 197 (H) 65 - 99 mg/dL    Blood Alcohol level:  Lab Results  Component Value Date   ETH <10 10/07/2017   ETH <10 07/07/2017   Metabolic Disorder Labs: Lab Results  Component Value Date   HGBA1C 10.0 (H) 07/09/2017   MPG 240.3 07/09/2017   MPG 225.95 06/10/2017  No results found for: PROLACTIN Lab Results  Component Value Date   CHOL 85 06/10/2017   TRIG 101 06/10/2017   HDL 22 (L) 06/10/2017   CHOLHDL 3.9 06/10/2017   VLDL 20 06/10/2017   LDLCALC 43 06/10/2017   Physical Findings: AIMS: Facial and Oral Movements Muscles of  Facial Expression: None, normal Lips and Perioral Area: None, normal Jaw: None, normal Tongue: None, normal,Extremity Movements Upper (arms, wrists, hands, fingers): None, normal Lower (legs, knees, ankles, toes): None, normal, Trunk Movements Neck, shoulders, hips: None, normal, Overall Severity Severity of abnormal movements (highest score from questions above): None, normal Incapacitation due to abnormal movements: None, normal Patient's awareness of abnormal movements (rate only patient's report): No Awareness, Dental Status Current problems with teeth and/or dentures?: No Does patient usually wear dentures?: No  CIWA:    COWS:     Musculoskeletal: Strength & Muscle Tone: within normal limits Gait & Station: normal Patient leans: N/A  Psychiatric Specialty Exam: Physical Exam  Nursing note and vitals reviewed.   Review of Systems  Constitutional: Negative for chills and fever.  Respiratory: Negative for cough.   Cardiovascular: Negative for chest pain.  Gastrointestinal: Negative for abdominal pain, heartburn, nausea and vomiting.  Psychiatric/Behavioral: Positive for depression, hallucinations and suicidal ideas. The patient is nervous/anxious. The patient does not have insomnia.     Blood pressure 107/70, pulse 70, temperature 98.8 F (37.1 C), temperature source Oral, resp. rate 20, height 6\' 2"  (1.88 m), weight 114.3 kg (252 lb).Body mass index is 32.35 kg/m.  General Appearance: Casual and Fairly Groomed  Eye Contact:  Good  Speech:  Clear and Coherent and Normal Rate  Volume:  Normal  Mood:  Anxious and Depressed  Affect:  Congruent and Constricted  Thought Process:  Coherent and Goal Directed  Orientation:  Full (Time, Place, and Person)  Thought Content:  Hallucinations: Auditory  Suicidal Thoughts:  Yes.  without intent/plan  Homicidal Thoughts:  Yes.  with intent/plan  Memory:  Immediate;   Fair Recent;   Fair Remote;   Fair  Judgement:  Poor  Insight:   Lacking  Psychomotor Activity:  Normal  Concentration:  Concentration: Fair  Recall:  [employer] of Knowledge:  Fair  Language:  Fair  Akathisia:  No  Handed:    AIMS (if indicated):     Assets:  Communication Skills Leisure Time Physical Health Resilience Social Support  ADL's:  Intact  Cognition:  WNL  Sleep:  Number of Hours: 6.75   Treatment Plan Summary: Daily contact with patient to assess and evaluate symptoms and progress in treatment and Medication management. Pt reports some improvement of his symptoms overall, but he continues to endorse mild AH. He agrees to continue his current medication regimen, and he agrees to work on safety planning so that we can plan to discharge to a boarding house in the coming days.  -Continue inpatient hospitalization. - Will continue today 10/17/2017 plan as below except where it is noted.  -Bipolar I, current episode mixed, with psychotic features -Continuerisperdal 1mg  qAM + 2mg  po qhs -Continuezoloft 100mg  po qDay  - Anxiety -Continuegabapentin 300mg  po TID -Continue atarax 50mg  po q6h prn anxiety  - DMII - ContinueNovolog  -HLD - Continue lipitor  -HTN - Continue HCTZ and lopressor  - Insomnia - Continue trazodone 50mg  po qhs prn insomnia  -Encourage participation in groups and the therapeutic milieu  -Discharge planning will be ongoing   Armandina Stammer, NP, PMHNP, FNP-BC. 10/17/2017, 3:40 PMPatient ID:  [name], male   DOB: 09/28/1960, 57 y.o.   MRN: [id number]

## 2017-10-17 NOTE — Progress Notes (Signed)
Recreation Therapy Notes  Date: 10/17/17 Time: 1000 Location: [address] Dayroom  Group Topic: Coping Skills  Goal Area(s) Addresses:  Patient will be able to identify positive coping skills. Patient will be able to identify benefits of using coping skills post d/c.  Behavioral Response: Engaged  Intervention: Worksheet, pencils   Activity: OrthoptistWeb Design.  Patients were to write the things that have had them stuck or stressed inside of the web.  Patients were to then come up with at least three coping skills and write them outside of the web.  Education: PharmacologistCoping Skills, Building control surveyorDischarge Planning.   Education Outcome: Acknowledges understanding/In group clarification offered/Needs additional education.   Clinical Observations/Feedback: Pt arrived late but was able to complete activity.  Pt stated some of the things he had been deal with were anger, bitterness, tiredness and loneliness.  Pt stated his coping skills for dealing with these things are counting to 10, playing music, exercising and start going out more.    Charles Orr, Charles Orr     Charles Orr, Charles Orr 10/17/2017 11:48 AM

## 2017-10-17 NOTE — Progress Notes (Signed)
Adult Psychoeducational Group Note  Date:  10/17/2017 Time:  4:53 PM  Group Topic/Focus:  Developing a Wellness Toolbox:   The focus of this group is to help patients develop a "wellness toolbox" with skills and strategies to promote recovery upon discharge.  Participation Level:  Did Not Attend      Clarene Critchleylizabeth O Adeyemi Hamad 10/17/2017, 4:53 PM

## 2017-10-17 NOTE — [facility] Group Notes (Signed)
[facility] LCSW Group Therapy Note  Date/Time: 10/17/17, 1315  Type of Therapy and Topic:  Group Therapy:  Overcoming Obstacles  Participation Level:  active  Description of Group:    In this group patients will be encouraged to explore what they see as obstacles to their own wellness and recovery. They will be guided to discuss their thoughts, feelings, and behaviors related to these obstacles. The group will process together ways to cope with barriers, with attention given to specific choices patients can make. Each patient will be challenged to identify changes they are motivated to make in order to overcome their obstacles. This group will be process-oriented, with patients participating in exploration of their own experiences as well as giving and receiving support and challenge from other group members.  Therapeutic Goals: 1. Patient will identify personal and current obstacles as they relate to admission. 2. Patient will identify barriers that currently interfere with their wellness or overcoming obstacles.  3. Patient will identify feelings, thought process and behaviors related to these barriers. 4. Patient will identify two changes they are willing to make to overcome these obstacles:    Summary of Patient Progress: Pt shared that substances, lonliness, and "outside influences" are the major obstacles in his life currently.  Pt very active in group discussion about taking steps to overcome obstacles and also on keeping a positive mindset while facing obstacles in life. Good participation.       Therapeutic Modalities:   Cognitive Behavioral Therapy Solution Focused Therapy Motivational Interviewing Relapse Prevention Therapy  Daleen SquibbGreg Onika Gudiel, LCSW

## 2017-10-17 NOTE — Tx Team (Signed)
Interdisciplinary Treatment and Diagnostic Plan Update  10/17/2017 Time of Session: 1008  Charles Orr MRN: [id number]  Principal Diagnosis: Bipolar disorder, curr episode mixed, severe, with psychotic features (HCC)  Secondary Diagnoses: Principal Problem:   Bipolar disorder, curr episode mixed, severe, with psychotic features (HCC)   Current Medications:  Current Facility-Administered Medications  Medication Dose Route Frequency Provider Last Rate Last Dose  . alum & mag hydroxide-simeth (MAALOX/MYLANTA) 200-200-20 MG/5ML suspension 30 mL  30 mL Oral Q4H PRN Fransisca Kaufmann A, NP   30 mL at 10/09/17 0645  . atorvastatin (LIPITOR) tablet 40 mg  40 mg Oral Daily Fransisca Kaufmann A, NP   40 mg at 10/17/17 0802  . gabapentin (NEURONTIN) capsule 300 mg  300 mg Oral TID Micheal Likens, MD   300 mg at 10/17/17 0803  . hydrochlorothiazide (HYDRODIURIL) tablet 25 mg  25 mg Oral Daily Fransisca Kaufmann A, NP   25 mg at 10/17/17 4098  . hydrOXYzine (ATARAX/VISTARIL) tablet 50 mg  50 mg Oral Q6H PRN Micheal Likens, MD   50 mg at 10/16/17 2105  . ibuprofen (ADVIL,MOTRIN) tablet 600 mg  600 mg Oral Q6H PRN Micheal Likens, MD   600 mg at 10/16/17 2106  . insulin aspart (novoLOG) injection 0-15 Units  0-15 Units Subcutaneous TID WC Nira Conn A, NP   3 Units at 10/17/17 0630  . insulin aspart protamine- aspart (NOVOLOG MIX 70/30) injection 60 Units  60 Units Subcutaneous Q breakfast Fransisca Kaufmann A, NP   60 Units at 10/17/17 0801   And  . insulin aspart protamine- aspart (NOVOLOG MIX 70/30) injection 45 Units  45 Units Subcutaneous Q supper Thermon Leyland, NP   45 Units at 10/16/17 1704  . magnesium hydroxide (MILK OF MAGNESIA) suspension 30 mL  30 mL Oral Daily PRN Fransisca Kaufmann A, NP      . metoprolol tartrate (LOPRESSOR) tablet 25 mg  25 mg Oral BID Fransisca Kaufmann A, NP   25 mg at 10/17/17 0803  . nicotine (NICODERM CQ - dosed in mg/24 hours) patch 14 mg  14 mg Transdermal Daily Fransisca Kaufmann A, NP   14 mg at 10/17/17 0803  . risperiDONE (RISPERDAL) tablet 1 mg  1 mg Oral BH-q7a Cobos, Rockey Situ, MD   1 mg at 10/17/17 0630  . risperiDONE (RISPERDAL) tablet 2 mg  2 mg Oral QHS Micheal Likens, MD   2 mg at 10/16/17 2106  . sertraline (ZOLOFT) tablet 100 mg  100 mg Oral Daily Leata Mouse, MD   100 mg at 10/17/17 0802  . traZODone (DESYREL) tablet 100 mg  100 mg Oral QHS PRN Nira Conn A, NP   100 mg at 10/16/17 2105    PTA Medications: Medications Prior to Admission  Medication Sig Dispense Refill Last Dose  . aspirin 81 MG EC tablet Take 1 tablet (81 mg total) by mouth daily. For heart health 1 tablet 0 Past Week at Unknown time  . dicyclomine (BENTYL) 20 MG tablet Take 1 tablet (20 mg total) by mouth 2 (two) times daily. 20 tablet 0 Past Week at Unknown time  . gabapentin (NEURONTIN) 300 MG capsule Take 1 capsule (300 mg total) by mouth 2 (two) times daily. For agitation 60 capsule 0 Past Week at Unknown time  . hydrOXYzine (ATARAX/VISTARIL) 25 MG tablet Take 1 tablet (25 mg total) by mouth 3 (three) times daily as needed for anxiety. 60 tablet 0 Past Week at Unknown time  .  insulin aspart protamine- aspart (NOVOLOG MIX 70/30) (70-30) 100 UNIT/ML injection Inject 0.45-0.6 mLs (45-60 Units total) into the skin 2 (two) times daily with a meal. 60 units in the morning and 45 units at dinner time 10 mL 0 Past Week at Unknown time  . lidocaine (LIDODERM) 5 % Place 1 patch onto the skin daily. Remove & Discard patch within 12 hours or as directed by MD: For pain management (Patient not taking: Reported on 07/07/2017) 7 patch 0 Not Taking at Unknown time  . meclizine (ANTIVERT) 25 MG tablet Take 1 tablet (25 mg total) by mouth 3 (three) times daily as needed for dizziness. 20 tablet 0 Past Week at Unknown time  . Multiple Vitamin (MULTIVITAMIN WITH MINERALS) TABS tablet Take 1 tablet by mouth daily. For Vitamin supplement   Past Week at Unknown time  .  nitroGLYCERIN (NITROSTAT) 0.4 MG SL tablet Place 1 tablet (0.4 mg total) every 5 (five) minutes as needed under the tongue for chest pain. 15 tablet 0 unk  . ranitidine (ZANTAC) 150 MG tablet Take 1 tablet (150 mg total) by mouth 2 (two) times daily. 30 tablet 0 Past Week at Unknown time  . sertraline (ZOLOFT) 50 MG tablet Take 1 tablet (50 mg total) by mouth daily. For depression 30 tablet 0 Past Week at Unknown time    Patient Stressors: Financial difficulties Health problems Medication change or noncompliance Substance abuse  Patient Strengths: Ability for insight Average or above average intelligence Capable of independent living General fund of knowledge  Treatment Modalities: Medication Management, Group therapy, Case management,  1 to 1 session with clinician, Psychoeducation, Recreational therapy.   Physician Treatment Plan for Primary Diagnosis: Bipolar disorder, curr episode mixed, severe, with psychotic features (HCC) Long Term Goal(s): Improvement in symptoms so as ready for discharge  Short Term Goals: Ability to identify and develop effective coping behaviors will improve Compliance with prescribed medications will improve  Medication Management: Evaluate patient's response, side effects, and tolerance of medication regimen.  Therapeutic Interventions: 1 to 1 sessions, Unit Group sessions and Medication administration.  Evaluation of Outcomes: Progressing  Physician Treatment Plan for Secondary Diagnosis: Principal Problem:   Bipolar disorder, curr episode mixed, severe, with psychotic features (HCC)   Long Term Goal(s): Improvement in symptoms so as ready for discharge  Short Term Goals: Ability to identify and develop effective coping behaviors will improve Compliance with prescribed medications will improve  Medication Management: Evaluate patient's response, side effects, and tolerance of medication regimen.  Therapeutic Interventions: 1 to 1 sessions, Unit  Group sessions and Medication administration.  Evaluation of Outcomes: Progressing   RN Treatment Plan for Primary Diagnosis: Bipolar disorder, curr episode mixed, severe, with psychotic features (HCC) Long Term Goal(s): Knowledge of disease and therapeutic regimen to maintain health will improve  Short Term Goals: Ability to identify and develop effective coping behaviors will improve and Compliance with prescribed medications will improve  Medication Management: RN will administer medications as ordered by provider, will assess and evaluate patient's response and provide education to patient for prescribed medication. RN will report any adverse and/or side effects to prescribing provider.  Therapeutic Interventions: 1 on 1 counseling sessions, Psychoeducation, Medication administration, Evaluate responses to treatment, Monitor vital signs and CBGs as ordered, Perform/monitor CIWA, COWS, AIMS and Fall Risk screenings as ordered, Perform wound care treatments as ordered.  Evaluation of Outcomes: Progressing   LCSW Treatment Plan for Primary Diagnosis: Bipolar disorder, curr episode mixed, severe, with psychotic features (HCC) Long Term Goal(s):  Safe transition to appropriate next level of care at discharge, Engage patient in therapeutic group addressing interpersonal concerns.  Short Term Goals: Engage patient in aftercare planning with referrals and resources  Therapeutic Interventions: Assess for all discharge needs, 1 to 1 time with Social worker, Explore available resources and support systems, Assess for adequacy in community support network, Educate family and significant other(s) on suicide prevention, Complete Psychosocial Assessment, Interpersonal group therapy.  Evaluation of Outcomes: Progressing  States he needs help with identifying a place to stay-has not been going to an outpatient clinic   Progress in Treatment: Attending groups: Yes Participating in groups: Yes Taking  medication as prescribed: Yes Toleration medication: Yes, no side effects reported at this time Family/Significant other contact made: No Patient understands diagnosis: Yes AEB asking for help with SI, HI, AH, depression Discussing patient identified problems/goals with staff: Yes Medical problems stabilized or resolved: Yes Denies suicidal/homicidal ideation: Yes Issues/concerns per patient self-inventory: None Other: N/A  New problem(s) identified: None identified at this time.   New Short Term/Long Term Goal(s): "I want to get back on my meds that help."  Discharge Plan or Barriers: [facility]  Reason for Continuation of Hospitalization: Depression  Medication stabilization    Estimated Length of Stay: 1 day  Attendees: Patient: 10/17/2017   Physician: Dr Altamese Carolinaainville, MD 10/17/2017   Nursing: Elby Beckoni Waller, RN 10/17/2017   RN Care Manager: 10/17/2017   Social Worker: Daleen SquibbGreg Antigone Crowell, LCSW 10/17/2017   Recreational Therapist:    Other: Enid Cutterharlotte Hoy, intern 10/17/2017   Other:  10/17/2017   Other: 10/17/2017      Other:     Scribe for Treatment Team:  Daleen SquibbGreg Jediah Horger,  LCSW 10/17/2017 10:42 AM

## 2017-10-18 LAB — GLUCOSE, CAPILLARY
GLUCOSE-CAPILLARY: 115 mg/dL — AB (ref 65–99)
Glucose-Capillary: 174 mg/dL — ABNORMAL HIGH (ref 65–99)

## 2017-10-18 MED ORDER — RISPERIDONE 1 MG PO TABS: ORAL_TABLET | ORAL | 0 refills | Status: DC

## 2017-10-18 MED ORDER — TRAZODONE HCL 100 MG PO TABS: 100.0000 mg | ORAL_TABLET | Freq: Every evening | ORAL | 0 refills | Status: DC | PRN

## 2017-10-18 NOTE — Progress Notes (Signed)
Patient ID: Charles Orr, male   DOB: Aug 22, 1961, 57 y.o.   MRN: [id number]  Patient discharged to home/self care on public transportation.  Patient acknowledged understanding of all discharge instructions and the receipt of all personal belongings.  Patient denies SI, HI and AVH upon discharge and was bright and pleasant upon leaving.

## 2017-10-18 NOTE — Progress Notes (Signed)
Recreation Therapy Notes  Date: 10/18/17 Time: 1015 Location: 500 Hall Dayroom  Group Topic: Leisure Education  Goal Area(s) Addresses:  Patient will identify positive leisure activities.  Patient will identify one positive benefit of participation in leisure activities.   Behavioral Response: Minimal  Intervention: Dry erase marker, white board  Activity: Pictionary.  The group was divided into two teams.  One person from the team would come to the board and draw the picture.  LRT would tell the person what to draw.  Each team would get one minute to guess the picture.  If the team did not guess the picture, the opposing team would get a chance to steal.  Education:  Leisure Education, Discharge Planning  Education Outcome: Acknowledges education/In group clarification offered/Needs additional education  Clinical Observations/Feedback: Pt mostly listened.  Pt stated several times that he couldn't see what was being drawn on the board.  Pt did make a few attempts to guess the pictures.  Pt was bright during group.     Caroll RancherMarjette Catrinia Racicot, LRT/CTRS      Caroll RancherLindsay, Raelan Burgoon A 10/18/2017 12:09 PM

## 2017-10-18 NOTE — [facility] Suicide Risk Assessment (Signed)
[facility] Discharge Suicide Risk Assessment   Principal Problem: Bipolar disorder, curr episode mixed, severe, with psychotic features [facility]) Discharge Diagnoses:  Patient Active Problem List   Diagnosis Date Noted  . Bipolar disorder, curr episode mixed, severe, with psychotic features (HCC) [F31.64] 10/08/2017  . Polysubstance (including opioids) dependence with physiological dependence (HCC) [F19.20] 10/06/2017  . 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 Chest pain [R07.9] 07/10/2017  . Angina pectoris (HCC) [I20.9] 07/09/2017  . Tobacco abuse [Z72.0] 07/09/2017  . Hypertension [I10] 07/09/2017  . Type II diabetes mellitus (HCC) [E11.9]   . GERD (gastroesophageal reflux disease) [K21.9]   . Chronic lower back pain [M54.5, G89.29]   . Poorly controlled diabetes mellitus (HCC) [E11.65]   . Hyperlipidemia with target LDL less than 70 [E78.5]   . Schizoaffective disorder, bipolar type (HCC) [F25.0] 06/16/2017  . Depression [F32.9] 06/15/2017  . Other schizophrenia (HCC) [F20.89]   . Pneumonia [J18.9] 06/11/2017  . Community acquired pneumonia of right lower lobe of lung (HCC) [J18.1]   . Hyperglycemia [R73.9]   . Localized edema [R60.0]   . Shortness of breath [R06.02] 06/10/2017    Total Time spent with patient: 30 minutes  Musculoskeletal: Strength & Muscle Tone: within normal limits Gait & Station: normal Patient leans: N/A  Psychiatric Specialty Exam: Review of Systems  Constitutional: Negative for chills and fever.  Respiratory: Negative for cough and shortness of breath.   Cardiovascular: Negative for chest pain.  Gastrointestinal: Negative for abdominal pain, heartburn, nausea and vomiting.  Psychiatric/Behavioral: Positive for hallucinations. Negative for depression and suicidal ideas.    Blood pressure (!) 94/54, pulse 78, temperature 98.8 F (37.1 C), temperature source Oral, resp. rate 20, height 6\' 2"   (1.88 m), weight 114.3 kg (252 lb).Body mass index is 32.35 kg/m.  General Appearance: Casual and Fairly Groomed  Patent attorney::  Good  Speech:  Clear and Coherent and Normal Rate  Volume:  Normal  Mood:  Euthymic  Affect:  Appropriate and Congruent  Thought Process:  Coherent and Goal Directed  Orientation:  Full (Time, Place, and Person)  Thought Content:  Logical  Suicidal Thoughts:  No  Homicidal Thoughts:  No  Memory:  Immediate;   Good Recent;   Good Remote;   Good  Judgement:  Fair  Insight:  Fair  Psychomotor Activity:  Normal  Concentration:  Poor  Recall:  [employer] of Knowledge:Fair  Language: Fair  Akathisia:  No  Handed:    AIMS (if indicated):     Assets:  Manufacturing systems engineer Physical Health Resilience Social Support  Sleep:  Number of Hours: 6.75  Cognition: WNL  ADL's:  Intact   Mental Status Per Nursing Assessment::   On Admission:     Demographic Factors:  Male, Low socioeconomic status, Living alone and Unemployed  Loss Factors: Financial problems/change in socioeconomic status  Historical Factors: Impulsivity  Risk Reduction Factors:   Positive social support, Positive therapeutic relationship and Positive coping skills or problem solving skills  Continued Clinical Symptoms:  Bipolar Disorder:   Depressive phase  Cognitive Features That Contribute To Risk:  None    Suicide Risk:  Minimal: No identifiable suicidal ideation.  Patients presenting with no risk factors but with morbid ruminations; may be classified as minimal risk based on the severity of the depressive symptoms  Follow-up Information    [facility]. Go on 10/23/2017.   Specialty:  Behavioral Health Why:  Please attend your follow up appt on Tuesday 2/19  at 8:00AM.  Please bring photo ID, copy of insurance card, and your hospital discharge paperwork. Contact information: [address] [place] [address] [phone]         Subjective Data:  Charles SchatzKarl Orr is a 57 y/o  M with history of bipolar disorder with psychotic features and polysubstance abuse who was admitted voluntarily with worsening depression, AH, and SI with plan to jump from a cliff. Pt had 3 ED presentations within the past week. He also reported using cocaine, opiates, and THC. [facility] level of care.Pt was restarted on previous outpatient medicationsof gabapentin, zoloft, and risperdal. His doses were titrated up during his stay,and he has been reporting incremental improvement of his presenting symptoms.  Today upon evaluation,  pt is bright and smiling, and he shares, "It's going pretty good." He shares that he is looking forward to discharging today and he has plans to contact both of the boarding houses he has been in contact with about securing a room after he receives funds from his brother. Pt reports his mood is doing well today. He denies SI today. He denies HI today though he had reported them when he first arrived, and he comments, "I'm not going to jail for that lady." Pt reports some ongoing AH but describes them as minimal and non-intrusive. He denies VH. He feels that his medications are helpful and he would like to continue them without changes. He was able to engage in safety planning including plan [facility] or contact emergency services if he feels unable to maintain his own safety or the safety of others. Pt had no further questions, comments, or concerns.    Plan Of Care/Follow-up recommendations:   -Discharge to outpatient level of care  -Bipolar I, current episode mixed, with psychotic features -Continuerisperdal1mg  qAM +2mg  po qhs -Continuezoloft 100mg  po qDay  - Anxiety -Continuegabapentin 300mg  po TID -Continue atarax 50mg  po q6h prn anxiety  - DMII - ContinueNovolog  -HLD - Continue  lipitor  -HTN - Continue HCTZ and lopressor  - Insomnia - Continue trazodone 50mg  po qhs prn insomnia  Activity:  as tolerated Diet:  normal Tests:  NA Other:  see above for DC plan  Micheal Likenshristopher T Jaicob Dia, MD 10/18/2017, 9:41 AM

## 2017-10-18 NOTE — Progress Notes (Signed)
  [facility] Discharge Plan :  Will you be returning to the same living situation after discharge:  No. Pt going to boarding house. At discharge, do you have transportation home?: No. Bus pass provided. Do you have the ability to pay for your medications: Yes,  medicaid  Release of information consent forms completed and in the chart;  Patient's signature needed at discharge.  Patient to Follow up at: Follow-up Information    [facility]. Go on 10/23/2017.   Specialty:  Behavioral Health Why:  Please attend your follow up appt on Tuesday 2/19 at 8:00AM.  Please bring photo ID, copy of insurance card, and your hospital discharge paperwork. Contact information: [address] [place] [address] [phone]           Next level of care provider has access to [facility] Link:no  Safety Planning and Suicide Prevention discussed: No.Pt declined.  Have you used any form of tobacco in the last 30 days? (Cigarettes, Smokeless Tobacco, Cigars, and/or Pipes): Yes  Has patient been referred to the Quitline?: Yes, faxed on 10/18/17  Patient has been referred for addiction treatment: Yes  Lorri FrederickWierda, Esbeidy Mclaine Jon, LCSW 10/18/2017, 11:49 AM

## 2017-10-18 NOTE — Progress Notes (Signed)
Recreation Therapy Notes  INPATIENT RECREATION TR PLAN  Patient Details Name: Charles Orr MRN: [id number] DOB: [birth year] Today's Date: 10/18/2017  Rec Therapy Plan Is patient appropriate for Therapeutic Recreation?: Yes Treatment times per week: about 3 days Estimated Length of Stay: 5-7 days TR Treatment/Interventions: Group participation (Comment)  Discharge Criteria Pt will be discharged from therapy if:: Discharged Treatment plan/goals/alternatives discussed and agreed upon by:: Patient/family  Discharge Summary Short term goals set: Pt will be able to identify 3 trigger to anger within 5 recreational therapy sessions. Short term goals met: Complete Progress toward goals comments: Groups attended Which groups?: Goal setting, Leisure education, Coping skills, Communication Reason goals not met: None Therapeutic equipment acquired: N/A Reason patient discharged from therapy: Discharge from hospital Pt/family agrees with progress & goals achieved: Yes Date patient discharged from therapy: 10/18/17      Victorino Sparrow, LRT/CTRS  Ria Comment, Abrea Henle A 10/18/2017, 12:28 PM

## 2017-10-18 NOTE — Plan of Care (Signed)
Pt was able to identify triggers and coping skills at completion of coping skills recreation therapy session.   Caroll RancherMarjette Jaylanie Boschee, LRT/CTRS

## 2017-10-20 NOTE — ED Provider Notes (Signed)
[facility]-EMERGENCY DEPT Provider Note   CSN: [id number] Arrival date & time: 10/05/17  2254     History   Chief Complaint Chief Complaint  Patient presents with  . Suicidal  . Abdominal Pain  . Emesis    HPI Charles Orr is a 57 y.o. male with a hx of HTN, MI, anxiety, chronic low back pain, schizophrenia, IDDM presents to the Emergency Department complaining of gradual, persistent, progressively worsening feeling of depression and SI onset 2 weeks ago.  Pt reports he feels serious, but did not make a specific plan. He states that he is prescribed medications for his depression, but he ran out of them recently.  He cannot remember exactly when.  Pt reports he has his other medication.  Associated symptoms include vomiting and generalized abd pain onset yesterday.  Pt reports emesis is NBNB but he does not know how many times.  Pt denies fevers.  No aggravating or alleviating factors.  Pt denies fever, chest pain, SOB, diarrhea, weakness, dizziness, syncope, dysuria.   Pt reports drinking EtOH today and use of crack cocaine.  He does smoke approx 1/2 pack per day of cigarettes per day.   The history is provided by the patient and medical records. No language interpreter was used.  Abdominal Pain   Associated symptoms include nausea and vomiting. Pertinent negatives include fever, diarrhea, constipation, dysuria, frequency, hematuria and headaches.  Emesis   Associated symptoms include abdominal pain. Pertinent negatives include no cough, no diarrhea, no fever and no headaches.    Past Medical History:  Diagnosis Date  . Anxiety   . Arthritis    "back" (06/12/2017)  . Bipolar disorder (HCC)   . CAP (community acquired pneumonia) 06/10/2017  . Chronic lower back pain   . Degenerative disc disease, lumbar   . Depression   . Fibromyalgia   . GERD (gastroesophageal reflux disease)   . High cholesterol   . Hypertension   . Myocardial infarction (HCC) 2002  .  Pneumonia 1989  . Schizophrenia (HCC)   . Type II diabetes mellitus [facility])     Patient Active Problem List   Diagnosis Date Noted  . Bipolar disorder, curr episode mixed, severe, with psychotic features (HCC) 10/08/2017  . Polysubstance (including opioids) dependence with physiological dependence (HCC) 10/06/2017  . 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 Chest pain 07/10/2017  . Angina pectoris (HCC) 07/09/2017  . Tobacco abuse 07/09/2017  . Hypertension 07/09/2017  . Type II diabetes mellitus (HCC)   . GERD (gastroesophageal reflux disease)   . Chronic lower back pain   . Poorly controlled diabetes mellitus (HCC)   . Hyperlipidemia with target LDL less than 70   . Schizoaffective disorder, bipolar type (HCC) 06/16/2017  . Depression 06/15/2017  . Other schizophrenia (HCC)   . Pneumonia 06/11/2017  . Community acquired pneumonia of right lower lobe of lung (HCC)   . Hyperglycemia   . Localized edema   . Shortness of breath 06/10/2017    Past Surgical History:  Procedure Laterality Date  . CARDIAC CATHETERIZATION  12/2006   [name] 01/18/2011  . INGUINAL HERNIA REPAIR Right   . LEFT HEART CATH AND CORONARY ANGIOGRAPHY N/A 07/11/2017   Procedure: LEFT HEART CATH AND CORONARY ANGIOGRAPHY;  Surgeon: Tonny Bollman, MD;  Location: [facility] INVASIVE CV LAB;  Service: Cardiovascular;  Laterality: N/A;  . PARATHYROIDECTOMY    . PERICARDIAL TAP  2000   ?; in [place]/notes 01/18/2011  Home Medications     Prior to Admission medications   Medication Sig Start Date End Date Taking? Authorizing Provider  aspirin 81 MG EC tablet Take 1 tablet (81 mg total) by mouth daily. For heart health 06/26/17  Yes Armandina Stammer I, NP  atorvastatin (LIPITOR) 40 MG tablet Take 1 tablet (40 mg total) by mouth daily. For high Cholesterol 06/26/17  Yes Nwoko, Nicole Kindred I, NP  dicyclomine (BENTYL) 20 MG tablet Take 1 tablet (20 mg total) by mouth 2  (two) times daily. 08/04/17  Yes Petrucelli, Samantha R, PA-C  gabapentin (NEURONTIN) 300 MG capsule Take 1 capsule (300 mg total) by mouth 2 (two) times daily. For agitation 06/26/17  Yes Nwoko, Nicole Kindred I, NP  hydrochlorothiazide (HYDRODIURIL) 25 MG tablet Take 1 tablet (25 mg total) by mouth daily. For high blood pressure 09/26/17  Yes Charlynne Pander, MD  hydrOXYzine (ATARAX/VISTARIL) 25 MG tablet Take 1 tablet (25 mg total) by mouth 3 (three) times daily as needed for anxiety. 06/26/17  Yes Armandina Stammer I, NP  insulin aspart protamine- aspart (NOVOLOG MIX 70/30) (70-30) 100 UNIT/ML injection Inject 0.45-0.6 mLs (45-60 Units total) into the skin 2 (two) times daily with a meal. 60 units in the morning and 45 units at dinner time 09/26/17  Yes Charlynne Pander, MD  meclizine (ANTIVERT) 25 MG tablet Take 1 tablet (25 mg total) by mouth 3 (three) times daily as needed for dizziness. 09/26/17  Yes Charlynne Pander, MD  metoprolol tartrate (LOPRESSOR) 25 MG tablet Take 1 tablet (25 mg total) by mouth 2 (two) times daily. For high blood pressure 09/26/17  Yes Charlynne Pander, MD  Multiple Vitamin (MULTIVITAMIN WITH MINERALS) TABS tablet Take 1 tablet by mouth daily. For Vitamin supplement 06/26/17  Yes Nwoko, Nicole Kindred I, NP  nitroGLYCERIN (NITROSTAT) 0.4 MG SL tablet Place 1 tablet (0.4 mg total) every 5 (five) minutes as needed under the tongue for chest pain. 07/11/17  Yes Vann, Jessica U, DO  ranitidine (ZANTAC) 150 MG tablet Take 1 tablet (150 mg total) by mouth 2 (two) times daily. 08/11/17  Yes Street, [place], PA-C  risperiDONE (RISPERDAL) 1 MG tablet Take 1 tablet (1 mg) by mouth in the mornings & 2 tablets (2 mg) at bedtime: For mood control Patient taking differently: Take 1 mg by mouth 2 (two) times daily.  06/26/17  Yes Armandina Stammer I, NP  sertraline (ZOLOFT) 50 MG tablet Take 1 tablet (50 mg total) by mouth daily. For depression 06/27/17  Yes Nwoko, Agnes I, NP  lidocaine (LIDODERM)  5 % Place 1 patch onto the skin daily. Remove & Discard patch within 12 hours or as directed by MD: For pain management Patient not taking: Reported on 07/07/2017 06/27/17   Armandina Stammer I, NP  nicotine (NICODERM CQ - DOSED IN MG/24 HOURS) 14 mg/24hr patch Place 1 patch (14 mg total) onto the skin daily. (May purchase over the counter): For smoking cessation Patient not taking: Reported on 07/07/2017 06/27/17   Armandina Stammer I, NP  omeprazole (PRILOSEC) 20 MG capsule Take 1 capsule (20 mg total) by mouth daily. Patient not taking: Reported on 09/26/2017 08/05/17   Fayrene Helper, PA-C  ondansetron (ZOFRAN) 4 MG tablet Take 1 tablet (4 mg total) by mouth every 6 (six) hours. Patient not taking: Reported on 09/26/2017 08/05/17   Fayrene Helper, PA-C  polyethylene glycol [facility] / Ethelene Hal) packet Take 17 g by mouth daily. Patient not taking: Reported on 09/26/2017 08/11/17   Street, [place], [place]  promethazine (PHENERGAN) 25 MG tablet Take 1 tablet (25 mg total) by mouth every 6 (six) hours as needed for nausea or vomiting. Patient not taking: Reported on 10/05/2017 08/11/17   Street, [place], PA-C  traZODone (DESYREL) 100 MG tablet Take 1 tablet (100 mg total) by mouth at bedtime as needed for sleep. Patient not taking: Reported on 09/26/2017 06/26/17   Sanjuana Kava, NP       Family History Family History  Problem Relation Age of Onset  . Hypertension Mother   . Diabetes Mother   . Heart disease Father   . Hypertension Sister   . Diabetes Sister   . Hypertension Brother   . Diabetes Brother   . Hypertension Sister   . Diabetes Sister   . Hypertension Brother   . Diabetes Brother     Social History Social History   Tobacco Use  . Smoking status: Current Every Day Smoker    Packs/day: 0.50    Years: 38.00    Pack years: 19.00    Types: Cigarettes  . Smokeless tobacco: Current User    Types: Chew  Substance Use Topics  . Alcohol use: Yes    Comment: 06/12/2017 "stopped ~ 1  yr ago"   . Drug use: Yes    Types: Cocaine    Comment: 06/12/2017 "stopped using cocaine ~ 1 yr ago; used marijuana when I was younger" last cocaine use 9 mths ago per pt on 06/15/17     Allergies   Patient has no known allergies.   Review of Systems Review of Systems  Constitutional: Negative for appetite change, diaphoresis, fatigue, fever and unexpected weight change.  HENT: Negative for mouth sores.   Eyes: Negative for visual disturbance.  Respiratory: Negative for cough, chest tightness, shortness of breath and wheezing.   Cardiovascular: Negative for chest pain.  Gastrointestinal: Positive for abdominal pain, nausea and vomiting. Negative for constipation and diarrhea.  Endocrine: Negative for polydipsia, polyphagia and polyuria.  Genitourinary: Negative for dysuria, frequency, hematuria and urgency.  Musculoskeletal: Negative for back pain and neck stiffness.  Skin: Negative for rash.  Allergic/Immunologic: Negative for immunocompromised state.  Neurological: Negative for syncope, light-headedness and headaches.  Hematological: Does not bruise/bleed easily.  Psychiatric/Behavioral: Positive for dysphoric mood and suicidal ideas. Negative for sleep disturbance. The patient is not nervous/anxious.      Physical Exam Updated Vital Signs BP 120/78 (BP Location: Left Arm)   Pulse 98   Temp 98.7 F (37.1 C) (Oral)   Resp 18   SpO2 95%   Physical Exam  Constitutional: He appears well-developed and well-nourished. No distress.  Awake, alert, nontoxic appearance  HENT:  Head: Normocephalic and atraumatic.  Mouth/Throat: Oropharynx is clear and moist. No oropharyngeal exudate.  Eyes: Conjunctivae are normal. No scleral icterus.  Neck: Normal range of motion. Neck supple.  Cardiovascular: Normal rate, regular rhythm and intact distal pulses.  Pulmonary/Chest: Effort normal and breath sounds normal. No respiratory distress. He has no wheezes.  Equal chest expansion    Abdominal: Soft. Bowel sounds are normal. He exhibits no mass. There is generalized tenderness (very mild). There is no rigidity, no rebound, no guarding, no CVA tenderness, no tenderness at McBurney's point and negative Murphy's sign.  Musculoskeletal: Normal range of motion. He exhibits no edema.  Neurological: He is alert.  Speech is clear and goal oriented Moves extremities without ataxia  Skin: Skin is warm and dry. He is not diaphoretic.  Psychiatric: He has a normal mood and affect. He  is agitated. He is not actively hallucinating. He expresses suicidal ideation. He expresses no homicidal ideation. He expresses no suicidal plans and no homicidal plans.  Nursing note and vitals reviewed.    ED Treatments / Results  Labs (all labs ordered are listed, but only abnormal results are displayed)  Labs Reviewed  COMPREHENSIVE METABOLIC PANEL - Abnormal; Notable for the following components:      Result Value    Sodium 133 (*)    CO2 20 (*)    Glucose, Bld 203 (*)    All other components within normal limits  RAPID URINE DRUG SCREEN, HOSP PERFORMED - Abnormal; Notable for the following components:   Opiates POSITIVE (*)    Cocaine POSITIVE (*)    Tetrahydrocannabinol POSITIVE (*)    All other components within normal limits  URINALYSIS, ROUTINE W REFLEX MICROSCOPIC - Abnormal; Notable for the following components:   Glucose, UA >=500 (*)    Hgb urine dipstick SMALL (*)    Ketones, ur 5 (*)    Protein, ur 30 (*)    Squamous Epithelial / LPF 0-5 (*)    All other components within normal limits  CBC - Abnormal; Notable for the following components:   WBC 11.0 (*)    All other components within normal limits  LIPASE, BLOOD  ETHANOL  SALICYLATE LEVEL  ACETAMINOPHEN LEVEL  I-STAT TROPONIN, ED      EKG  EKG Interpretation  Date/Time:  Saturday October 06 2017 04:01:28 EST Ventricular Rate:  94 PR Interval:    QRS Duration: 91 QT  Interval:  355 QTC Calculation: 444 R Axis:   74 Text Interpretation:  Sinus rhythm Probable left atrial enlargement Borderline T abnormalities, inferior leads Confirmed by Nicanor Alcon, April ([id number]) on 10/06/2017 5:48:45 AM         Procedures Procedures (including critical care time)  Medications Ordered in ED Medications  LORazepam (ATIVAN) injection 0-4 mg (not administered)    Or  LORazepam (ATIVAN) tablet 0-4 mg (not administered)  LORazepam (ATIVAN) injection 0-4 mg (not administered)    Or  LORazepam (ATIVAN) tablet 0-4 mg (not administered)  thiamine (VITAMIN B-1) tablet 100 mg (not administered)    Or  thiamine (B-1) injection 100 mg (not administered)  ibuprofen (ADVIL,MOTRIN) tablet 600 mg (not administered)  ondansetron (ZOFRAN) tablet 4 mg (not administered)  alum & mag hydroxide-simeth (MAALOX/MYLANTA) 200-200-20 MG/5ML suspension 30 mL (not administered)  nicotine (NICODERM CQ - dosed in mg/24 hours) patch 21 mg (not administered)  sodium chloride 0.9 % bolus 1,000 mL (0 mLs Intravenous Stopped 10/06/17 0522)  pantoprazole (PROTONIX) EC tablet 40 mg (40 mg Oral Given 10/06/17 0035)       Initial Impression / Assessment and Plan / ED Course  I have reviewed the triage vital signs and the nursing notes.  Pertinent labs & imaging results that were available during my care of the patient were reviewed by me and considered in my medical decision making (see chart for details).  Clinical Course as of Oct 20 1557  Sat Oct 06, 2017  0628 Pt needs AM psyc eval, per TTS.  [HM]    Clinical Course User Index [HM] Salihah Peckham, Dahlia Client, PA-C    Patient presents with suicidal ideation.    He additionally reports abdominal pain.  Mild leukocytosis.  Abdomen is soft without rebound or guarding on my exam.  CMP with mild hyponatremia and CO2 is low.  Fluid bolus given.  Patient has tolerated p.o. without vomiting.  Urinalysis shows a  glucose and protein but no evidence of urinary  tract infection.  Drug screen is positive for opiates cocaine and THC.  Patient did admit to cocaine tonight.  EKG is nonischemic.  Patient is without chest pain.  Normal lipase, normal LFTs and normal troponin.  Patient is medically cleared for TTS evaluation.  Final Clinical Impressions(s) / ED Diagnoses   Final diagnoses:  Non-intractable vomiting without nausea, unspecified vomiting type  Suicidal ideations  Polysubstance (including opioids) dependence with physiological dependence [facility](HCC)    ED Discharge Orders    None       Mardene SayerMuthersbaugh, Boyd KerbsHannah, PA-C 10/06/17 [id number]    Marqual Mi, Dahlia ClientHannah, PA-C 10/06/17 0628    Juniel Groene, Dahlia ClientHannah, PA-C 10/20/17 1607    Shaune PollackIsaacs, Cameron, MD 10/20/17 [phone]

## 2018-03-07 IMAGING — DX DG ABDOMEN ACUTE W/ 1V CHEST
4 series · 4 of 4 positions shown · non-contrast
Comparison: Chest radiograph dated 07/09/2017 and CT of the abdomen
pelvis dated 07/07/2017

CLINICAL DATA: 55-year-old male with left-sided chest and upper
abdominal pain. Constipation. Concern for obstruction.

EXAM:
DG ABDOMEN ACUTE W/ 1V CHEST

[chest pa]
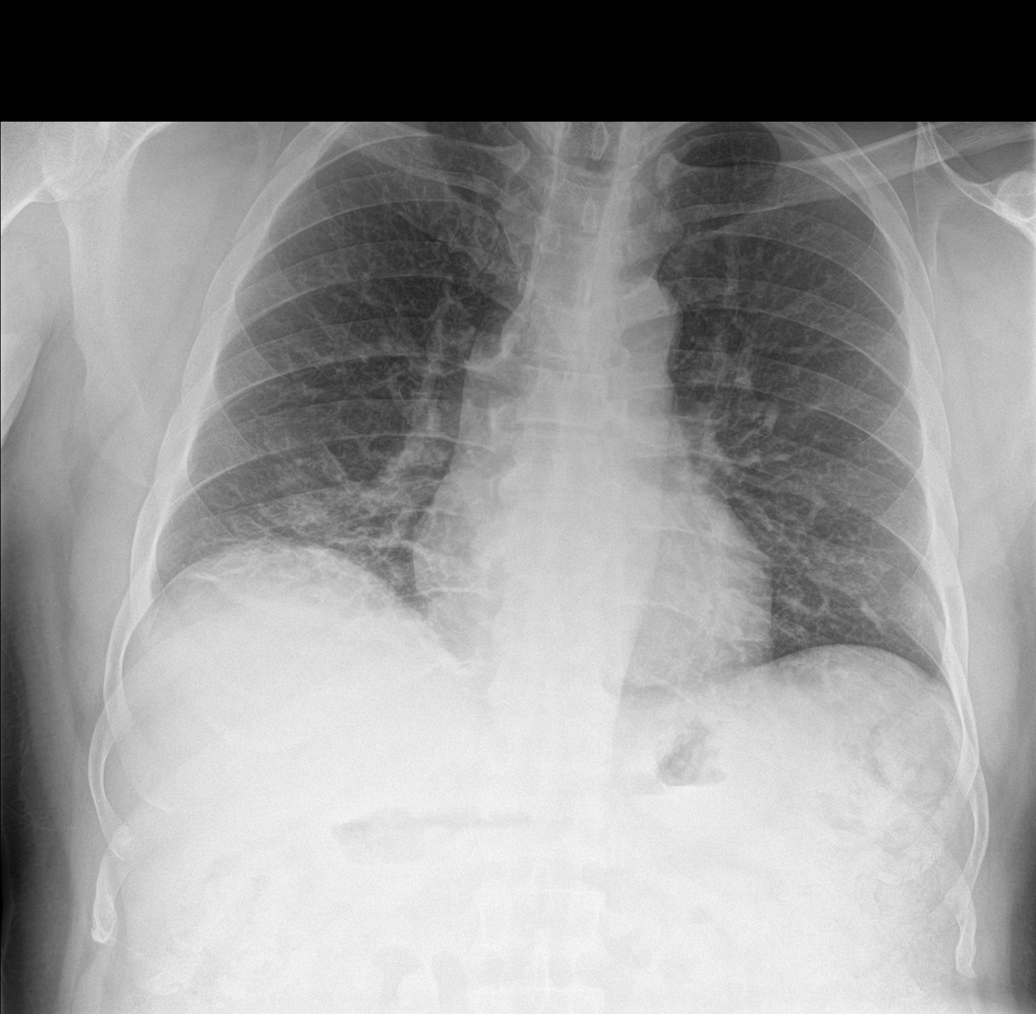

[abdomen erect]
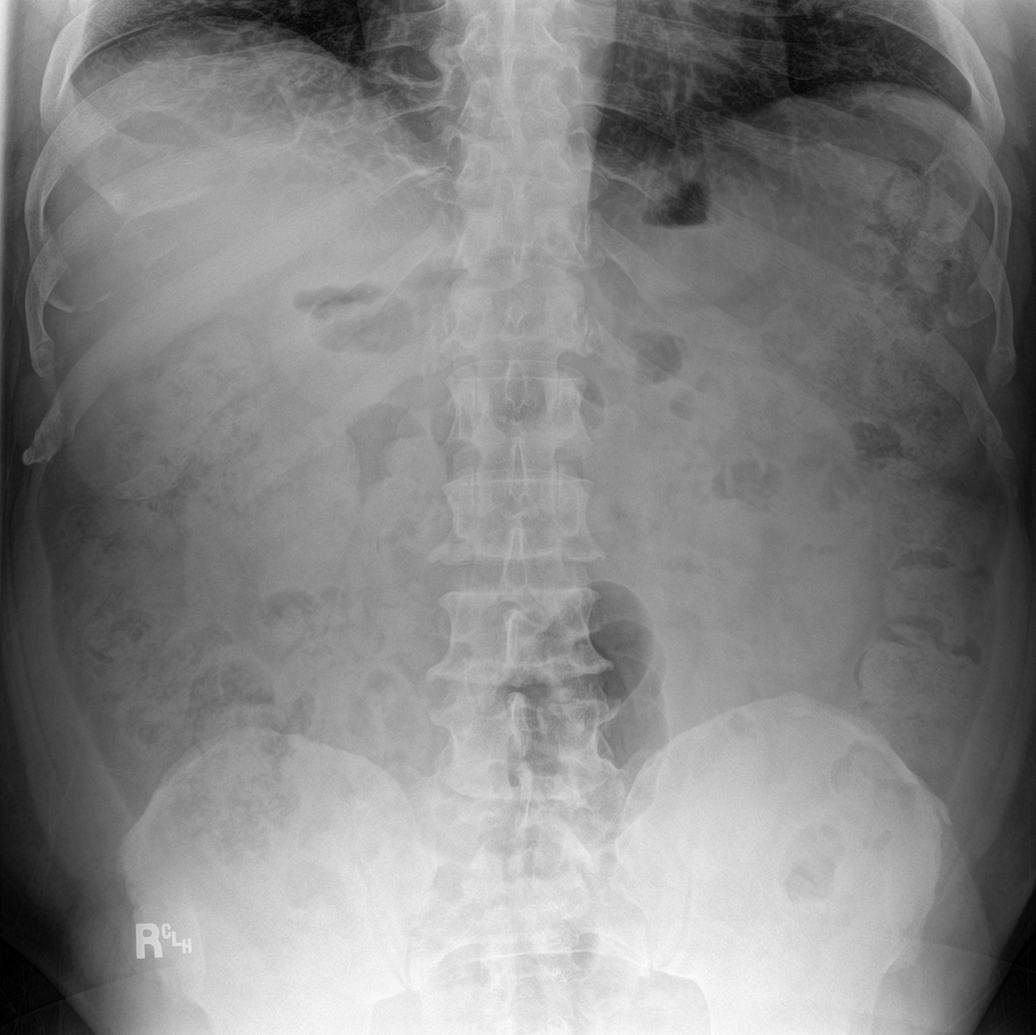

[abdomen supine (1 of 2)]
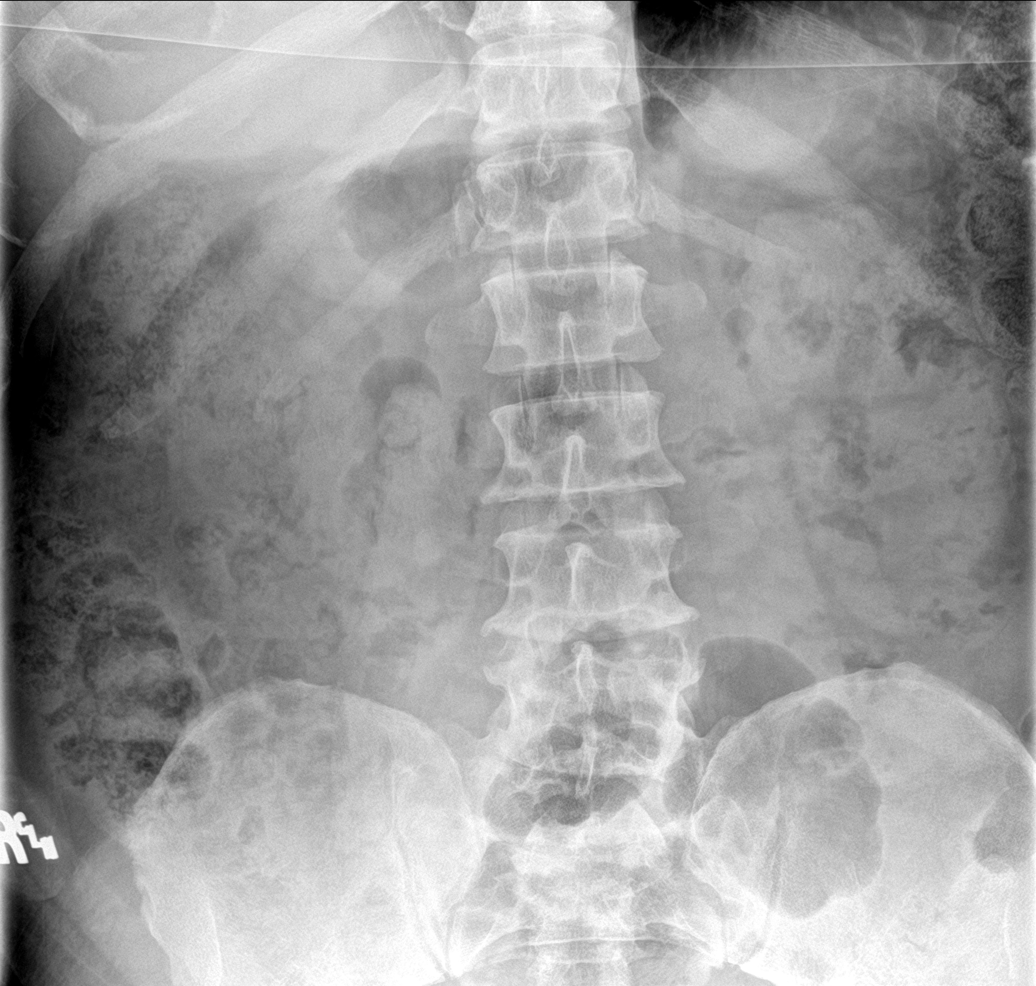

[abdomen supine (2 of 2)]
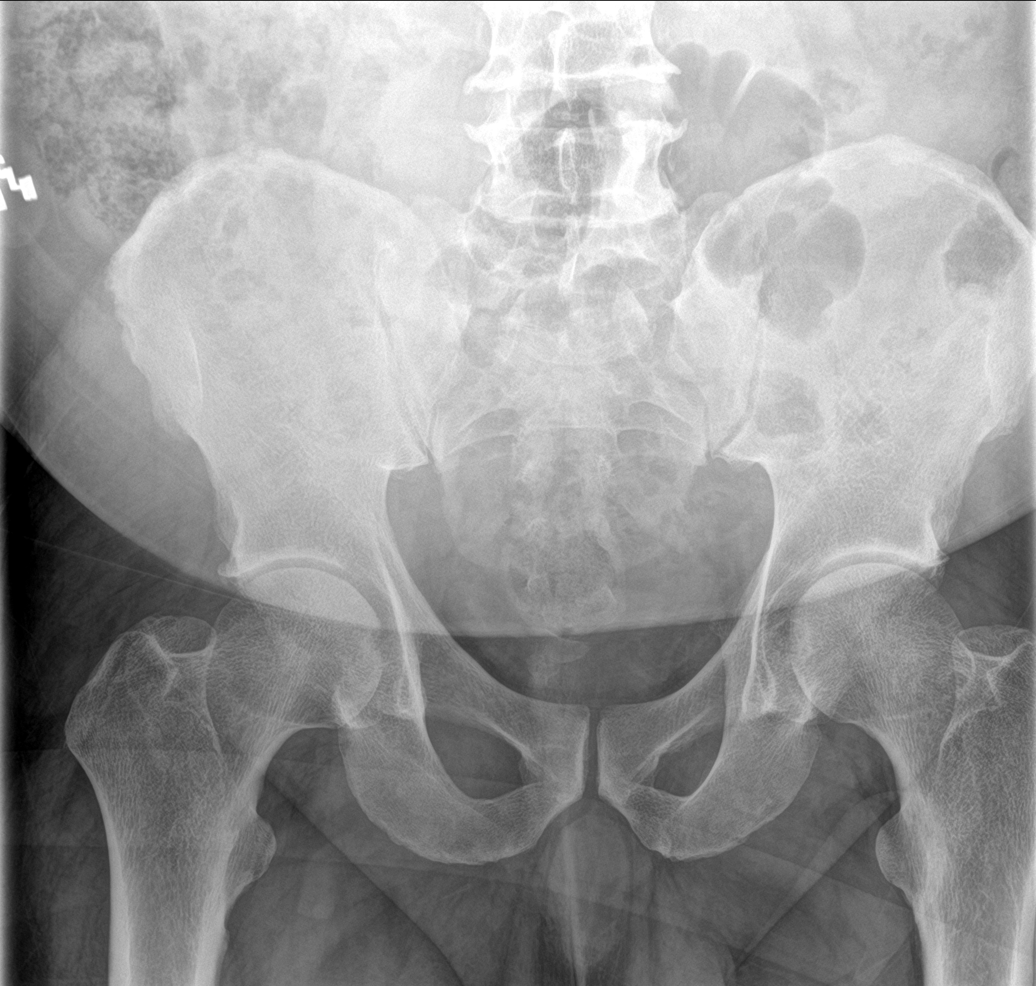

[4 of 4 positions shown; findings below may reference images not displayed]

FINDINGS: Mild eventration of the right hemidiaphragm with right lung base
linear atelectasis/ scarring. Mild bronchiectatic changes noted.
There is no focal consolidation, pleural effusion, or pneumothorax.
The cardiac silhouette is within normal limits.

There is moderate to large amount of colonic stool burden. No bowel
dilatation or evidence of obstruction. No free air or radiopaque
calculi. The osseous structures and soft tissues are grossly
unremarkable.
IMPRESSION: 1. No acute cardiopulmonary process.
2. Moderate to large colonic stool burden. No evidence of bowel
obstruction.

## 2018-05-22 ENCOUNTER — Ambulatory Visit (INDEPENDENT_AMBULATORY_CARE_PROVIDER_SITE_OTHER): Payer: Medicaid Other | Admitting: Orthopaedic Surgery

## 2018-05-22 ENCOUNTER — Ambulatory Visit (INDEPENDENT_AMBULATORY_CARE_PROVIDER_SITE_OTHER): Payer: Medicaid Other

## 2018-05-22 ENCOUNTER — Encounter (INDEPENDENT_AMBULATORY_CARE_PROVIDER_SITE_OTHER): Payer: Self-pay | Admitting: Orthopaedic Surgery

## 2018-05-22 DIAGNOSIS — M545 Low back pain: Secondary | ICD-10-CM

## 2018-05-22 DIAGNOSIS — G8929 Other chronic pain: Secondary | ICD-10-CM

## 2018-05-22 MED ORDER — PREDNISONE 10 MG (21) PO TBPK: ORAL_TABLET | ORAL | 0 refills | Status: DC

## 2018-05-22 MED ORDER — ACETAMINOPHEN-CODEINE #3 300-30 MG PO TABS: 1.0000 | ORAL_TABLET | ORAL | 0 refills | Status: DC | PRN

## 2018-05-23 NOTE — Progress Notes (Signed)
Office Visit Note   Patient: Charles Orr           Date of Birth: 05/10/1961           MRN: [id number] Visit Date: 05/22/2018              Requested by: Garnet KoyanagiJohnson, Julie R, FNP [address] [place], [place] [id number] PCP: Patient, No Pcp Per   Assessment & Plan: Visit Diagnoses:  1. Chronic low back pain, unspecified back pain laterality, with sciatica presence unspecified     Plan: impression is lumbar spondylosis with worsening for more than 2.5 months.  Repeat MRI to evaluate for structural abnormalities.  Prescriptions for steroid taper and tylenol #3.  I stated that I will not prescribe opiate pain meds for chronic pain. Total face to face encounter time was greater than 45 minutes and over half of this time was spent in counseling and/or coordination of care.  Follow-Up Instructions: Return in about 2 weeks (around 06/05/2018).   Orders:  Orders Placed This Encounter  Procedures  . XR Lumbar Spine 2-3 Views  . MR Lumbar Spine w/o contrast  . Ambulatory referral to Pain Clinic   Meds ordered this encounter  Medications  . predniSONE (STERAPRED UNI-PAK 21 TAB) 10 MG (21) TBPK tablet    Sig: Take as directed    Dispense:  21 tablet    Refill:  0  . acetaminophen-codeine (TYLENOL #3) 300-30 MG tablet    Sig: Take 1-2 tablets by mouth every 4 (four) hours as needed for moderate pain.    Dispense:  30 tablet    Refill:  0      Procedures: No procedures performed   Clinical Data: No additional findings.   Subjective: Chief Complaint  Patient presents with  . Lower Back - Pain    Charles Orr is a 57 year old male who comes in today for low back pain without much radiation of pain down into the legs.  This has been a chronic issue but has recently gotten worse.  Denies any constitutional symptoms, numbness, tingling, incontinence.     Review of Systems  Constitutional: Negative.   All other systems reviewed and are negative.    Objective: Vital Signs: There  were no vitals taken for this visit.  Physical Exam  Constitutional: He is oriented to person, place, and time. He appears well-developed and well-nourished.  HENT:  Head: Normocephalic and atraumatic.  Eyes: Pupils are equal, round, and reactive to light.  Neck: Neck supple.  Pulmonary/Chest: Effort normal.  Abdominal: Soft.  Musculoskeletal: Normal range of motion.  Neurological: He is alert and oriented to person, place, and time.  Skin: Skin is warm.  Psychiatric: He has a normal mood and affect. His behavior is normal. Judgment and thought content normal.  Nursing note and vitals reviewed.   Ortho Exam Lumbar spine tender. Negative sciatic tension signs.  No focal motor or sensory deficits. Normal reflexes. Specialty Comments:  No specialty comments available.  Imaging: Xr Lumbar Spine 2-3 Views  Result Date: 05/22/2018 Lumbar spondylosis.  Mild multilevel DDD.    PMFS History: Patient Active Problem List   Diagnosis Date Noted  . Bipolar disorder, curr episode mixed, severe, with psychotic features (HCC) 10/08/2017  . Polysubstance (including opioids) dependence with physiological dependence (HCC) 10/06/2017  . 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 Chest pain 07/10/2017  . Angina pectoris (HCC) 07/09/2017  . Tobacco abuse 07/09/2017  .  Hypertension 07/09/2017  . Type II diabetes mellitus (HCC)   . GERD (gastroesophageal reflux disease)   . Chronic lower back pain   . Poorly controlled diabetes mellitus (HCC)   . Hyperlipidemia with target LDL less than 70   . Schizoaffective disorder, bipolar type (HCC) 06/16/2017  . Depression 06/15/2017  . Other schizophrenia (HCC)   . Pneumonia 06/11/2017  . Community acquired pneumonia of right lower lobe of lung (HCC)   . Hyperglycemia   . Localized edema   . Shortness of breath 06/10/2017   Past Medical History:  Diagnosis Date  . Anxiety   . Arthritis     "back" (06/12/2017)  . Bipolar disorder (HCC)   . CAP (community acquired pneumonia) 06/10/2017  . Chronic lower back pain   . Degenerative disc disease, lumbar   . Depression   . Fibromyalgia   . GERD (gastroesophageal reflux disease)   . High cholesterol   . Hypertension   . Myocardial infarction (HCC) 2002  . Pneumonia 1989  . Schizophrenia (HCC)   . Type II diabetes mellitus (HCC)     Family History  Problem Relation Age of Onset  . Hypertension Mother   . Diabetes Mother   . Heart disease Father   . Hypertension Sister   . Diabetes Sister   . Hypertension Brother   . Diabetes Brother   . Hypertension Sister   . Diabetes Sister   . Hypertension Brother   . Diabetes Brother     Past Surgical History:  Procedure Laterality Date  . CARDIAC CATHETERIZATION  12/2006   [name] 01/18/2011  . INGUINAL HERNIA REPAIR Right   . LEFT HEART CATH AND CORONARY ANGIOGRAPHY N/A 07/11/2017   Procedure: LEFT HEART CATH AND CORONARY ANGIOGRAPHY;  Surgeon: Tonny Bollman, MD;  Location: [facility] INVASIVE CV LAB;  Service: Cardiovascular;  Laterality: N/A;  . PARATHYROIDECTOMY    . PERICARDIAL TAP  2000   ?; in [place]/notes 01/18/2011   Social History   Occupational History  . Not on file  Tobacco Use  . Smoking status: Current Every Day Smoker    Packs/day: 0.50    Years: 38.00    Pack years: 19.00    Types: Cigarettes  . Smokeless tobacco: Current User    Types: Chew  Substance and Sexual Activity  . Alcohol use: Yes    Comment: 06/12/2017 "stopped ~ 1 yr ago"   . Drug use: Yes    Types: Cocaine    Comment: 06/12/2017 "stopped using cocaine ~ 1 yr ago; used marijuana when I was younger" last cocaine use 9 mths ago per pt on 06/15/17  . Sexual activity: Not Currently

## 2018-06-05 ENCOUNTER — Other Ambulatory Visit (INDEPENDENT_AMBULATORY_CARE_PROVIDER_SITE_OTHER): Payer: Self-pay | Admitting: Orthopaedic Surgery

## 2018-06-05 DIAGNOSIS — M545 Low back pain, unspecified: Secondary | ICD-10-CM

## 2018-06-05 DIAGNOSIS — G8929 Other chronic pain: Secondary | ICD-10-CM

## 2018-06-11 ENCOUNTER — Ambulatory Visit (INDEPENDENT_AMBULATORY_CARE_PROVIDER_SITE_OTHER): Payer: Medicaid Other | Admitting: Orthopaedic Surgery

## 2018-06-12 ENCOUNTER — Other Ambulatory Visit (INDEPENDENT_AMBULATORY_CARE_PROVIDER_SITE_OTHER): Payer: Self-pay | Admitting: Orthopaedic Surgery

## 2018-06-12 DIAGNOSIS — G8929 Other chronic pain: Secondary | ICD-10-CM

## 2018-06-12 DIAGNOSIS — M545 Low back pain, unspecified: Secondary | ICD-10-CM

## 2018-06-22 ENCOUNTER — Ambulatory Visit
Admission: RE | Admit: 2018-06-22 | Discharge: 2018-06-22 | Disposition: A | Discharge status: Home / Self Care | Payer: Self-pay | Source: Ambulatory Visit | Attending: Orthopaedic Surgery | Admitting: Orthopaedic Surgery

## 2018-06-22 DIAGNOSIS — M545 Low back pain, unspecified: Secondary | ICD-10-CM

## 2018-06-23 NOTE — Progress Notes (Signed)
Needs ESI with Dr. Alvester Morin

## 2018-06-24 ENCOUNTER — Other Ambulatory Visit (INDEPENDENT_AMBULATORY_CARE_PROVIDER_SITE_OTHER): Payer: Self-pay

## 2018-06-24 DIAGNOSIS — M545 Low back pain, unspecified: Secondary | ICD-10-CM

## 2018-06-26 ENCOUNTER — Other Ambulatory Visit (INDEPENDENT_AMBULATORY_CARE_PROVIDER_SITE_OTHER): Payer: Self-pay

## 2018-06-26 DIAGNOSIS — M545 Low back pain, unspecified: Secondary | ICD-10-CM

## 2018-06-29 ENCOUNTER — Emergency Department (HOSPITAL_COMMUNITY): Payer: Medicaid Other

## 2018-06-29 ENCOUNTER — Other Ambulatory Visit: Payer: Self-pay

## 2018-06-29 ENCOUNTER — Encounter (HOSPITAL_COMMUNITY): Payer: Self-pay

## 2018-06-29 ENCOUNTER — Emergency Department (HOSPITAL_COMMUNITY)
Admission: EM | Admit: 2018-06-29 | Discharge: 2018-06-29 | Disposition: A | Discharge status: Home / Self Care | Payer: Medicaid Other | Attending: Emergency Medicine | Admitting: Emergency Medicine

## 2018-06-29 DIAGNOSIS — E119 Type 2 diabetes mellitus without complications: Secondary | ICD-10-CM | POA: Diagnosis not present

## 2018-06-29 DIAGNOSIS — R42 Dizziness and giddiness: Secondary | ICD-10-CM | POA: Diagnosis present

## 2018-06-29 DIAGNOSIS — F17210 Nicotine dependence, cigarettes, uncomplicated: Secondary | ICD-10-CM | POA: Insufficient documentation

## 2018-06-29 DIAGNOSIS — I10 Essential (primary) hypertension: Secondary | ICD-10-CM | POA: Insufficient documentation

## 2018-06-29 LAB — BASIC METABOLIC PANEL
ANION GAP: 7 (ref 5–15)
BUN: 6 mg/dL (ref 6–20)
CHLORIDE: 109 mmol/L (ref 98–111)
CO2: 23 mmol/L (ref 22–32)
Calcium: 9.2 mg/dL (ref 8.9–10.3)
Creatinine, Ser: 1.11 mg/dL (ref 0.61–1.24)
GFR calc Af Amer: >60 mL/min (ref >60)
GLUCOSE: 143 mg/dL — AB (ref 70–99)
POTASSIUM: 3.6 mmol/L (ref 3.5–5.1)
SODIUM: 139 mmol/L (ref 135–145)

## 2018-06-29 LAB — CBC
HEMATOCRIT: 46.9 % (ref 39.0–52.0)
HEMOGLOBIN: 15.2 g/dL (ref 13.0–17.0)
MCH: 26.5 pg (ref 26.0–34.0)
MCHC: 32.4 g/dL (ref 30.0–36.0)
MCV: 81.7 fL (ref 80.0–100.0)
Platelets: 235 10*3/uL (ref 150–400)
RBC: 5.74 MIL/uL (ref 4.22–5.81)
RDW: 14.5 % (ref 11.5–15.5)
WBC: 7.3 10*3/uL (ref 4.0–10.5)
nRBC: 0 % (ref 0.0–0.2)

## 2018-06-29 LAB — CBG MONITORING, ED
GLUCOSE-CAPILLARY: 120 mg/dL — AB (ref 70–99)
Glucose-Capillary: 123 mg/dL — ABNORMAL HIGH (ref 70–99)

## 2018-06-29 LAB — TROPONIN I: Troponin I: <0.03 ng/mL (ref <0.03)

## 2018-06-29 NOTE — ED Notes (Signed)
Pt left prior to receiving d/c papers. Pt spoke with Evangeline Gula and was unhappy about waiting longer, pt left. Pt explained why he was waiting. Pt left ambulatory NAD.

## 2018-06-29 NOTE — ED Provider Notes (Signed)
Emergency Department Provider Note   I have reviewed the triage vital signs and the nursing notes.   HISTORY  Chief Complaint Dizziness   HPI Charles Orr is a 57 y.o. male with PMH of IDDM, HLD, HTN, CAD, and Schizophrenia's to the emergency department for evaluation of acute onset lightheadedness, weakness, and diaphoresis.  The patient took his insulin and was getting on the bus to go get something to eat when his symptoms began.  He denies any chest pain, heart palpitations, shortness of breath.  He has been in his normal state of health until this morning.  He felt like his blood sugar may be low so went to get a soda after calling EMS.  He drank a soda prior to EMS arriving to check his blood sugar which was normal at that time.  States he is feeling better and would like something to eat. No radiation of symptoms or modifying factors.   Past Medical History:  Diagnosis Date  . Anxiety   . Arthritis    "back" (06/12/2017)  . Bipolar disorder (HCC)   . CAP (community acquired pneumonia) 06/10/2017  . Chronic lower back pain   . Degenerative disc disease, lumbar   . Depression   . Fibromyalgia   . GERD (gastroesophageal reflux disease)   . High cholesterol   . Hypertension   . Myocardial infarction (HCC) 2002  . Pneumonia 1989  . Schizophrenia (HCC)   . Type II diabetes mellitus [facility])     Patient Active Problem List   Diagnosis Date Noted  . Bipolar disorder, curr episode mixed, severe, with psychotic features (HCC) 10/08/2017  . Polysubstance (including opioids) dependence with physiological dependence (HCC) 10/06/2017  . 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 Chest pain 07/10/2017  . Angina pectoris (HCC) 07/09/2017  . Tobacco abuse 07/09/2017  . Hypertension 07/09/2017  . Type II diabetes mellitus (HCC)   . GERD (gastroesophageal reflux disease)   . Chronic lower back pain   . Poorly controlled diabetes  mellitus (HCC)   . Hyperlipidemia with target LDL less than 70   . Schizoaffective disorder, bipolar type (HCC) 06/16/2017  . Depression 06/15/2017  . Other schizophrenia (HCC)   . Pneumonia 06/11/2017  . Community acquired pneumonia of right lower lobe of lung (HCC)   . Hyperglycemia   . Localized edema   . Shortness of breath 06/10/2017    Past Surgical History:  Procedure Laterality Date  . CARDIAC CATHETERIZATION  12/2006   [name] 01/18/2011  . INGUINAL HERNIA REPAIR Right   . LEFT HEART CATH AND CORONARY ANGIOGRAPHY N/A 07/11/2017   Procedure: LEFT HEART CATH AND CORONARY ANGIOGRAPHY;  Surgeon: Tonny Bollman, MD;  Location: [facility] INVASIVE CV LAB;  Service: Cardiovascular;  Laterality: N/A;  . PARATHYROIDECTOMY    . PERICARDIAL TAP  2000   ?; in [place]/notes 01/18/2011    Allergies Patient has no known allergies.  Family History  Problem Relation Age of Onset  . Hypertension Mother   . Diabetes Mother   . Heart disease Father   . Hypertension Sister   . Diabetes Sister   . Hypertension Brother   . Diabetes Brother   . Hypertension Sister   . Diabetes Sister   . Hypertension Brother   . Diabetes Brother     Social History Social History   Tobacco Use  . Smoking status: Current Every Day Smoker    Packs/day: 0.50    Years: 38.00  Pack years: 19.00    Types: Cigarettes  . Smokeless tobacco: Current User    Types: Chew  Substance Use Topics  . Alcohol use: Yes    Comment: 06/12/2017 "stopped ~ 1 yr ago"   . Drug use: Yes    Types: Cocaine    Comment: 06/12/2017 "stopped using cocaine ~ 1 yr ago; used marijuana when I was younger" last cocaine use 9 mths ago per pt on 06/15/17    Review of Systems  Constitutional: No fever/chills. Positive lightheadedness and diaphoresis.  Eyes: No visual changes. ENT: No sore throat. Cardiovascular: Denies chest pain. Respiratory: Denies shortness of breath. Gastrointestinal: No abdominal pain.  No nausea, no  vomiting.  No diarrhea.  No constipation. Genitourinary: Negative for dysuria. Musculoskeletal: Negative for back pain. Skin: Negative for rash. Neurological: Negative for headaches, focal weakness or numbness.  10-point ROS otherwise negative.  ____________________________________________   PHYSICAL EXAM:  VITAL SIGNS: ED Triage Vitals  Enc Vitals Group     BP 06/29/18 0952 (!) 156/98     Pulse Rate 06/29/18 0952 62     Resp 06/29/18 0952 20     Temp 06/29/18 0952 97.6 F (36.4 C)     Temp Source 06/29/18 0952 Oral     SpO2 06/29/18 0952 100 %     Weight 06/29/18 0954 247 lb (112 kg)     Height 06/29/18 0954 6\' 2"  (1.88 m)     Pain Score 06/29/18 0954 0   Constitutional: Alert and oriented. Well appearing and in no acute distress. Eyes: Conjunctivae are normal. Head: Atraumatic. Nose: No congestion/rhinnorhea. Mouth/Throat: Mucous membranes are moist.   Neck: No stridor.  Cardiovascular: Normal rate, regular rhythm. Good peripheral circulation. Grossly normal heart sounds.   Respiratory: Normal respiratory effort.  No retractions. Lungs CTAB. Gastrointestinal: Soft and nontender. No distention.  Musculoskeletal: No lower extremity tenderness nor edema. No gross deformities of extremities. Neurologic:  Normal speech and language. No gross focal neurologic deficits are appreciated.  Skin:  Skin is warm, dry and intact. No rash noted.  ____________________________________________   LABS (all labs ordered are listed, but only abnormal results are displayed)  Labs Reviewed  BASIC METABOLIC PANEL - Abnormal; Notable for the following components:      Result Value   Glucose, Bld 143 (*)    All other components within normal limits  CBG MONITORING, ED - Abnormal; Notable for the following components:   Glucose-Capillary 120 (*)    All other components within normal limits  CBG MONITORING, ED - Abnormal; Notable for the following components:   Glucose-Capillary 123 (*)      All other components within normal limits  CBC  TROPONIN I   ____________________________________________  EKG   EKG Interpretation  Date/Time:  Saturday June 29 2018 09:52:50 EDT Ventricular Rate:  65 PR Interval:  170 QRS Duration: 78 QT Interval:  422 QTC Calculation: 438 R Axis:   70 Text Interpretation:  Normal sinus rhythm Normal ECG No STEMI. ST changes similar to prior tracings.  Confirmed by Alona Bene [phone]) on 06/29/2018 11:26:43 AM       ____________________________________________  RADIOLOGY  Dg Chest 2 View  Result Date: 06/29/2018 CLINICAL DATA:  Dizziness. EXAM: CHEST - 2 VIEW COMPARISON:  September 26, 2017 FINDINGS: The heart size and mediastinal contours are within normal limits. Both lungs are clear. The visualized skeletal structures are unremarkable. IMPRESSION: No active cardiopulmonary disease. Electronically Signed   By: Gerome Sam III M.D   On:  06/29/2018 12:31    ____________________________________________   PROCEDURES  Procedure(s) performed:   Procedures  None ____________________________________________   INITIAL IMPRESSION / ASSESSMENT AND PLAN / ED COURSE  Pertinent labs & imaging results that were available during my care of the patient were reviewed by me and considered in my medical decision making (see chart for details).  Patient presents to the emergency department with generalized weakness, diaphoresis, near syncope.  I suspect that his symptoms are secondary to hypoglycemia.  He did not have hypoglycemia with EMS, however, because he drank a soda prior to their arrival.  Patient with no chest pain/palpitation symptoms.  EKG reviewed with no acute findings.  Chest x-ray normal.  Labs are unremarkable.  Plan for orthostatic vital signs and repeat CBG here but will likely discharge.   CBG and vitals are stable. Patient is up and ambulatory in the ED. Will discharge with plan for close PCP follow up.   At this  time, I do not feel there is any life-threatening condition present. I have reviewed and discussed all results (EKG, imaging, lab, urine as appropriate), exam findings with patient. I have reviewed nursing notes and appropriate previous records.  I feel the patient is safe to be discharged home without further emergent workup. Discussed usual and customary return precautions. Patient and family (if present) verbalize understanding and are comfortable with this plan.  Patient will follow-up with their primary care provider. If they do not have a primary care provider, information for follow-up has been provided to them. All questions have been answered.  ____________________________________________  FINAL CLINICAL IMPRESSION(S) / ED DIAGNOSES  Final diagnoses:  Lightheadedness   Note:  This document was prepared using Dragon voice recognition software and may include unintentional dictation errors.  Alona Bene, MD Emergency Medicine    Tyiesha Brackney, Arlyss Repress, MD 06/29/18 [phone]

## 2018-06-29 NOTE — Discharge Instructions (Signed)

## 2018-06-29 NOTE — ED Notes (Signed)
Pt given sandwich bag and ginger ale.  

## 2018-06-29 NOTE — ED Triage Notes (Signed)
EMS reports pt felt dizzy after getting off the bus this morning on the way to get breakfast.States he took 60 units of his insulin 70/30.  Initially diaphoretic, no complaints at this time. Pt a.o upon arrival.

## 2018-07-08 ENCOUNTER — Ambulatory Visit (INDEPENDENT_AMBULATORY_CARE_PROVIDER_SITE_OTHER): Payer: Self-pay

## 2018-07-08 ENCOUNTER — Ambulatory Visit (INDEPENDENT_AMBULATORY_CARE_PROVIDER_SITE_OTHER): Payer: Medicaid Other | Admitting: Physical Medicine and Rehabilitation

## 2018-07-08 ENCOUNTER — Encounter (INDEPENDENT_AMBULATORY_CARE_PROVIDER_SITE_OTHER): Payer: Self-pay | Admitting: Physical Medicine and Rehabilitation

## 2018-07-08 VITALS — BP 172/102 | HR 72 | Temp 97.8°F

## 2018-07-08 DIAGNOSIS — M5416 Radiculopathy, lumbar region: Secondary | ICD-10-CM

## 2018-07-08 MED ORDER — METHYLPREDNISOLONE ACETATE 80 MG/ML IJ SUSP
80.0000 mg | Freq: Once | INTRAMUSCULAR | Status: AC
  Administered 2018-07-08: 80 mg

## 2018-07-08 NOTE — Patient Instructions (Signed)

## 2018-07-08 NOTE — Progress Notes (Signed)
.  Numeric Pain Rating Scale and Functional Assessment Average Pain 9   In the last MONTH (on 0-10 scale) has pain interfered with the following?  1. General activity like being  able to carry out your everyday physical activities such as walking, climbing stairs, carrying groceries, or moving a chair?  Rating(8)   +Driver, -BT, -Dye Allergies.  

## 2018-07-08 NOTE — Procedures (Signed)
Lumbar Epidural Steroid Injection - Interlaminar Approach with Fluoroscopic Guidance  Patient: Charles Orr      Date of Birth: 05-Nov-1960 MRN: [id number] PCP: Patient, No Pcp Per      Visit Date: 07/08/2018   Universal Protocol:     Consent Given By: the patient  Position: PRONE  Additional Comments: Vital signs were monitored before and after the procedure. Patient was prepped and draped in the usual sterile fashion. The correct patient, procedure, and site was verified.   Injection Procedure Details:  Procedure Site One Meds Administered:  Meds ordered this encounter  Medications  . methylPREDNISolone acetate (DEPO-MEDROL) injection 80 mg     Laterality: Left  Location/Site:  L4-L5  Needle size: 20 G  Needle type: Tuohy  Needle Placement: Paramedian epidural  Findings:   -Comments: Excellent flow of contrast into the epidural space.  Procedure Details: Using a paramedian approach from the side mentioned above, the region overlying the inferior lamina was localized under fluoroscopic visualization and the soft tissues overlying this structure were infiltrated with 4 ml. of 1% Lidocaine without Epinephrine. The Tuohy needle was inserted into the epidural space using a paramedian approach.   The epidural space was localized using loss of resistance along with lateral and bi-planar fluoroscopic views.  After negative aspirate for air, blood, and CSF, a 2 ml. volume of Isovue-250 was injected into the epidural space and the flow of contrast was observed. Radiographs were obtained for documentation purposes.    The injectate was administered into the level noted above.   Additional Comments:  The patient tolerated the procedure well Dressing: Band-Aid    Post-procedure details: Patient was observed during the procedure. Post-procedure instructions were reviewed.  Patient left the clinic in stable condition.

## 2018-07-18 NOTE — Progress Notes (Signed)
Charles Orr - 57 y.o. male MRN [id number]  Date of birth: Apr 25, 1961  Office Visit Note: Visit Date: 07/08/2018 PCP: Patient, No Pcp Per Referred by: No ref. provider found  Subjective: Chief Complaint  Patient presents with  . Lower Back - Pain  . Right Leg - Pain, Numbness  . Left Leg - Pain, Numbness  . Right Foot - Pain, Numbness  . Left Foot - Pain, Numbness   HPI: DEKARI BURES is a 57 y.o. male who comes in today For planned epidural injection for chronic worsening low back pain and bilateral radicular type leg pain with paresthesia.  Patient has disc herniation at L4-5 which is right paracentral small left extraforaminal protrusion at L3 no high-grade stenosis or nerve compression.  Patient's course is complicated by history of bipolar disorder and schizoaffective disorder and depression.  He also has diabetes.  He has had injection in the past.  This will be diagnostic and hopefully therapeutic.  If it does not give him a lot of relief more than 50% I think the next step appropriately would be chronic pain management situation with cognitive behavioral therapy or possibly spine surgery referral.  ROS Otherwise per HPI.  Assessment & Plan: Visit Diagnoses:  1. Lumbar radiculopathy     Plan: No additional findings.   Meds & Orders:  Meds ordered this encounter  Medications  . methylPREDNISolone acetate (DEPO-MEDROL) injection 80 mg    Orders Placed This Encounter  Procedures  . XR C-ARM NO REPORT  . Epidural Steroid injection    Follow-up: Return if symptoms worsen or fail to improve.   Procedures: No procedures performed  Lumbar Epidural Steroid Injection - Interlaminar Approach with Fluoroscopic Guidance  Patient: DEAARON FULGHUM      Date of Birth: Mar 25, 1961 MRN: [id number] PCP: Patient, No Pcp Per      Visit Date: 07/08/2018   Universal Protocol:     Consent Given By: the patient  Position: PRONE  Additional Comments: Vital signs were monitored before  and after the procedure. Patient was prepped and draped in the usual sterile fashion. The correct patient, procedure, and site was verified.   Injection Procedure Details:  Procedure Site One Meds Administered:  Meds ordered this encounter  Medications  . methylPREDNISolone acetate (DEPO-MEDROL) injection 80 mg     Laterality: Left  Location/Site:  L4-L5  Needle size: 20 G  Needle type: Tuohy  Needle Placement: Paramedian epidural  Findings:   -Comments: Excellent flow of contrast into the epidural space.  Procedure Details: Using a paramedian approach from the side mentioned above, the region overlying the inferior lamina was localized under fluoroscopic visualization and the soft tissues overlying this structure were infiltrated with 4 ml. of 1% Lidocaine without Epinephrine. The Tuohy needle was inserted into the epidural space using a paramedian approach.   The epidural space was localized using loss of resistance along with lateral and bi-planar fluoroscopic views.  After negative aspirate for air, blood, and CSF, a 2 ml. volume of Isovue-250 was injected into the epidural space and the flow of contrast was observed. Radiographs were obtained for documentation purposes.    The injectate was administered into the level noted above.   Additional Comments:  The patient tolerated the procedure well Dressing: Band-Aid    Post-procedure details: Patient was observed during the procedure. Post-procedure instructions were reviewed.  Patient left the clinic in stable condition.    Clinical History: MRI LUMBAR SPINE WITHOUT CONTRAST  TECHNIQUE: Multiplanar, multisequence  MR imaging of the lumbar spine was performed. No intravenous contrast was administered.  COMPARISON:  Lumbar spine radiographs 05/22/2018  FINDINGS: Segmentation: Normal. The lowest disc space is considered to be L5-S1.  Alignment:  Normal  Vertebrae: No acute compression fracture,  discitis-osteomyelitis of focal marrow lesion.  Conus medullaris and cauda equina: The conus medullaris terminates at the L1 level. The cauda equina and conus medullaris are both normal.  Paraspinal and other soft tissues: The visualized aorta, IVC and iliac vessels are normal. The visualized retroperitoneal organs and paraspinal soft tissues are normal.  Disc levels: Sagittal plane imaging includes the T11-12 disc level through the upper sacrum, with axial imaging of the T12-S2 disc levels.  T11-T12: Imaged only in the sagittal plane.  Normal.  T12-L1: Normal.  L1-L2: Normal.  L2-L3: Mild disc bulge without spinal canal or neural foraminal stenosis.  L3-L4: Disc desiccation and intermediate diffuse bulge. Mild bilateral facet hypertrophy. Mild right foraminal stenosis and moderate left foraminal stenosis. Extraforaminal component of the disc contacts the exiting left L3 nerve root. No central spinal canal stenosis.  L4-L5: Small right subarticular disc protrusion superimposed on large diffuse disc bulge. There is narrowing of the right lateral recess with displacement of the descending right L5 nerve root. Mild left lateral recess narrowing. Mild bilateral neural foraminal stenosis. No central spinal canal stenosis. There is a posterior and lateral right-sided synovial cyst measuring 8 x 7 mm.  L5-S1: Normal disc.  No stenosis.  The visualized portion of the sacrum is normal.  IMPRESSION: 1. L4-L5 right subarticular disc protrusion superimposed on diffuse disc bulge. Displacement of the descending right L5 nerve root in the lateral recess. Mild bilateral foraminal stenosis. 2. L3-L4 disc bulge with extraforaminal component contacting the exiting left L3 nerve root. There is also moderate left foraminal stenosis.   Electronically Signed   By: Deatra RobinsonKevin  Herman M.D.   On: 06/22/2018 22:20   He reports that he has been smoking cigarettes. He has a 19.00  pack-year smoking history. His smokeless tobacco use includes chew. No results for input(s): HGBA1C, LABURIC in the last 8760 hours.  Objective:  VS:  HT:    WT:   BMI:     BP:(!) 172/102  HR:72bpm  TEMP:97.8 F (36.6 C)(Oral)  RESP:  Physical Exam  Ortho Exam Imaging: No results found.  Past Medical/Family/Surgical/Social History: Medications & Allergies reviewed per EMR, new medications updated. Patient Active Problem List   Diagnosis Date Noted  . Bipolar disorder, curr episode mixed, severe, with psychotic features (HCC) 10/08/2017  . Polysubstance (including opioids) dependence with physiological dependence (HCC) 10/06/2017  . 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 Chest pain 07/10/2017  . Angina pectoris (HCC) 07/09/2017  . Tobacco abuse 07/09/2017  . Hypertension 07/09/2017  . Type II diabetes mellitus (HCC)   . GERD (gastroesophageal reflux disease)   . Chronic lower back pain   . Poorly controlled diabetes mellitus (HCC)   . Hyperlipidemia with target LDL less than 70   . Schizoaffective disorder, bipolar type (HCC) 06/16/2017  . Depression 06/15/2017  . Other schizophrenia (HCC)   . Pneumonia 06/11/2017  . Community acquired pneumonia of right lower lobe of lung (HCC)   . Hyperglycemia   . Localized edema   . Shortness of breath 06/10/2017   Past Medical History:  Diagnosis Date  . Anxiety   . Arthritis    "back" (06/12/2017)  . Bipolar disorder (HCC)   . CAP (community acquired pneumonia)  06/10/2017  . Chronic lower back pain   . Degenerative disc disease, lumbar   . Depression   . Fibromyalgia   . GERD (gastroesophageal reflux disease)   . High cholesterol   . Hypertension   . Myocardial infarction (HCC) 2002  . Pneumonia 1989  . Schizophrenia (HCC)   . Type II diabetes mellitus (HCC)    Family History  Problem Relation Age of Onset  . Hypertension Mother   . Diabetes Mother   . Heart  disease Father   . Hypertension Sister   . Diabetes Sister   . Hypertension Brother   . Diabetes Brother   . Hypertension Sister   . Diabetes Sister   . Hypertension Brother   . Diabetes Brother    Past Surgical History:  Procedure Laterality Date  . CARDIAC CATHETERIZATION  12/2006   [name] 01/18/2011  . INGUINAL HERNIA REPAIR Right   . LEFT HEART CATH AND CORONARY ANGIOGRAPHY N/A 07/11/2017   Procedure: LEFT HEART CATH AND CORONARY ANGIOGRAPHY;  Surgeon: Tonny Bollman, MD;  Location: [facility] INVASIVE CV LAB;  Service: Cardiovascular;  Laterality: N/A;  . PARATHYROIDECTOMY    . PERICARDIAL TAP  2000   ?; in [place]/notes 01/18/2011   Social History   Occupational History  . Not on file  Tobacco Use  . Smoking status: Current Every Day Smoker    Packs/day: 0.50    Years: 38.00    Pack years: 19.00    Types: Cigarettes  . Smokeless tobacco: Current User    Types: Chew  Substance and Sexual Activity  . Alcohol use: Yes    Comment: 06/12/2017 "stopped ~ 1 yr ago"   . Drug use: Yes    Types: Cocaine    Comment: 06/12/2017 "stopped using cocaine ~ 1 yr ago; used marijuana when I was younger" last cocaine use 9 mths ago per pt on 06/15/17  . Sexual activity: Not Currently

## 2018-07-20 ENCOUNTER — Emergency Department (HOSPITAL_COMMUNITY): Payer: Medicaid Other

## 2018-07-20 ENCOUNTER — Encounter (HOSPITAL_COMMUNITY): Payer: Self-pay | Admitting: Emergency Medicine

## 2018-07-20 ENCOUNTER — Other Ambulatory Visit: Payer: Self-pay

## 2018-07-20 ENCOUNTER — Emergency Department (HOSPITAL_COMMUNITY)
Admission: EM | Admit: 2018-07-20 | Discharge: 2018-07-20 | Disposition: A | Discharge status: Home / Self Care | Payer: Medicaid Other | Attending: Emergency Medicine | Admitting: Emergency Medicine

## 2018-07-20 DIAGNOSIS — R55 Syncope and collapse: Secondary | ICD-10-CM | POA: Diagnosis not present

## 2018-07-20 DIAGNOSIS — I10 Essential (primary) hypertension: Secondary | ICD-10-CM | POA: Insufficient documentation

## 2018-07-20 DIAGNOSIS — F17210 Nicotine dependence, cigarettes, uncomplicated: Secondary | ICD-10-CM | POA: Diagnosis not present

## 2018-07-20 DIAGNOSIS — E119 Type 2 diabetes mellitus without complications: Secondary | ICD-10-CM | POA: Insufficient documentation

## 2018-07-20 LAB — CBC WITH DIFFERENTIAL/PLATELET
Abs Immature Granulocytes: 0.04 10*3/uL (ref 0.00–0.07)
Basophils Absolute: 0.1 10*3/uL (ref 0.0–0.1)
Basophils Relative: 1 %
EOS ABS: 0.2 10*3/uL (ref 0.0–0.5)
EOS PCT: 3 %
HCT: 44.2 % (ref 39.0–52.0)
HEMOGLOBIN: 14.3 g/dL (ref 13.0–17.0)
Immature Granulocytes: 0 %
LYMPHS PCT: 29 %
Lymphs Abs: 2.6 10*3/uL (ref 0.7–4.0)
MCH: 26.6 pg (ref 26.0–34.0)
MCHC: 32.4 g/dL (ref 30.0–36.0)
MCV: 82.3 fL (ref 80.0–100.0)
Monocytes Absolute: 0.6 10*3/uL (ref 0.1–1.0)
Monocytes Relative: 7 %
Neutro Abs: 5.5 10*3/uL (ref 1.7–7.7)
Neutrophils Relative %: 60 %
PLATELETS: 196 10*3/uL (ref 150–400)
RBC: 5.37 MIL/uL (ref 4.22–5.81)
RDW: 14.2 % (ref 11.5–15.5)
WBC: 9.1 10*3/uL (ref 4.0–10.5)
nRBC: 0 % (ref 0.0–0.2)

## 2018-07-20 LAB — I-STAT TROPONIN, ED
TROPONIN I, POC: 0 ng/mL (ref 0.00–0.08)
Troponin i, poc: 0.01 ng/mL (ref 0.00–0.08)

## 2018-07-20 LAB — COMPREHENSIVE METABOLIC PANEL
ALT: 24 U/L (ref 0–44)
AST: 13 U/L — ABNORMAL LOW (ref 15–41)
Albumin: 3.7 g/dL (ref 3.5–5.0)
Alkaline Phosphatase: 124 U/L (ref 38–126)
Anion gap: 6 (ref 5–15)
BUN: 14 mg/dL (ref 6–20)
CHLORIDE: 107 mmol/L (ref 98–111)
CO2: 27 mmol/L (ref 22–32)
CREATININE: 1.18 mg/dL (ref 0.61–1.24)
Calcium: 9.1 mg/dL (ref 8.9–10.3)
GFR calc non Af Amer: >60 mL/min (ref >60)
Glucose, Bld: 186 mg/dL — ABNORMAL HIGH (ref 70–99)
Potassium: 4.1 mmol/L (ref 3.5–5.1)
Sodium: 140 mmol/L (ref 135–145)
Total Bilirubin: 0.5 mg/dL (ref 0.3–1.2)
Total Protein: 6.6 g/dL (ref 6.5–8.1)

## 2018-07-20 LAB — CBG MONITORING, ED
GLUCOSE-CAPILLARY: 148 mg/dL — AB (ref 70–99)
Glucose-Capillary: 183 mg/dL — ABNORMAL HIGH (ref 70–99)

## 2018-07-20 NOTE — ED Notes (Signed)
Pt back from CT

## 2018-07-20 NOTE — ED Triage Notes (Signed)
Pt here with complaint of dizziness per EMS. States he got off the bus and felt dizzy, thought his sugar dropped. CBG 200 per EMS. Pt started on clonidine yesterday. BP 180/118 initally. 18 g in place, last BP 155/99. NSR 75, 99% room air. Pt AO x 4

## 2018-07-20 NOTE — ED Provider Notes (Signed)
Emergency Department Provider Note   I have reviewed the triage vital signs and the nursing notes.   HISTORY  Chief Complaint Dizziness   HPI Charles Orr is a 57 y.o. male with PMH of GERD, IDDM, Schizophrenia, HTN, and prior CAD presents to the emergency department for evaluation of lightheadedness while getting off the bus today.  The patient was seen in the emergency department by myself on 10/26 with similar presentation.  The patient states that he did eat today and took his typical 70/30 insulin after eating of 50 Units at 3 PM.  Several hours later he was getting off the bus when he suddenly felt very lightheaded.  He denies any heart palpitations, shortness of breath, chest pain/pressure.  He had to be assisted by others around him but did not have a full syncope event or fall.  Patient did see his primary care physician earlier in the week and was started on Clonidine, which he has been taking. The dose of his Beta blocker was also increased.  He denies any reproducible symptoms.  He has noticed a persistent headache over the past several days but denies any sudden onset, maximal intensity headache symptoms.  He states he has baseline lower back pain with pain in the left leg which is unchanged.  He denies any worsening back pain, fevers, chills.  He states after feeling very lightheaded [facility] and was transferred to the emergency department.    Past Medical History:  Diagnosis Date  . Anxiety   . Arthritis    "back" (06/12/2017)  . Bipolar disorder (HCC)   . CAP (community acquired pneumonia) 06/10/2017  . Chronic lower back pain   . Degenerative disc disease, lumbar   . Depression   . Fibromyalgia   . GERD (gastroesophageal reflux disease)   . High cholesterol   . Hypertension   . Myocardial infarction (HCC) 2002  . Pneumonia 1989  . Schizophrenia (HCC)   . Type II diabetes mellitus [facility](HCC)     Patient Active Problem List   Diagnosis Date Noted  .  Bipolar disorder, curr episode mixed, severe, with psychotic features (HCC) 10/08/2017  . Polysubstance (including opioids) dependence with physiological dependence (HCC) 10/06/2017  . 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 Chest pain 07/10/2017  . Angina pectoris (HCC) 07/09/2017  . Tobacco abuse 07/09/2017  . Hypertension 07/09/2017  . Type II diabetes mellitus (HCC)   . GERD (gastroesophageal reflux disease)   . Chronic lower back pain   . Poorly controlled diabetes mellitus (HCC)   . Hyperlipidemia with target LDL less than 70   . Schizoaffective disorder, bipolar type (HCC) 06/16/2017  . Depression 06/15/2017  . Other schizophrenia (HCC)   . Pneumonia 06/11/2017  . Community acquired pneumonia of right lower lobe of lung (HCC)   . Hyperglycemia   . Localized edema   . Shortness of breath 06/10/2017    Past Surgical History:  Procedure Laterality Date  . CARDIAC CATHETERIZATION  12/2006   [name]/notes 01/18/2011  . INGUINAL HERNIA REPAIR Right   . LEFT HEART CATH AND CORONARY ANGIOGRAPHY N/A 07/11/2017   Procedure: LEFT HEART CATH AND CORONARY ANGIOGRAPHY;  Surgeon: Tonny Bollmanooper, Michael, MD;  [facility];  Service: Cardiovascular;  Laterality: N/A;  . PARATHYROIDECTOMY    . PERICARDIAL TAP  2000   ?; in [place]/notes 01/18/2011    Allergies Patient has no known allergies.  Family History  Problem Relation  Age of Onset  . Hypertension Mother   . Diabetes Mother   . Heart disease Father   . Hypertension Sister   . Diabetes Sister   . Hypertension Brother   . Diabetes Brother   . Hypertension Sister   . Diabetes Sister   . Hypertension Brother   . Diabetes Brother     Social History Social History   Tobacco Use  . Smoking status: Current Every Day Smoker    Packs/day: 0.50    Years: 38.00    Pack years: 19.00    Types: Cigarettes  . Smokeless tobacco: Current User    Types: Chew  Substance Use  Topics  . Alcohol use: Yes    Comment: 06/12/2017 "stopped ~ 1 yr ago"   . Drug use: Yes    Types: Cocaine    Comment: 06/12/2017 "stopped using cocaine ~ 1 yr ago; used marijuana when I was younger" last cocaine use 9 mths ago per pt on 06/15/17    Review of Systems  Constitutional: No fever/chills. Positive lightheadedness.  Eyes: No visual changes. ENT: No sore throat. Cardiovascular: Denies chest pain. Respiratory: Denies shortness of breath. Gastrointestinal: No abdominal pain.  No nausea, no vomiting.  No diarrhea.  No constipation. Genitourinary: Negative for dysuria. Musculoskeletal: Positive for chronic back pain. Skin: Negative for rash. Neurological: Negative for focal weakness or numbness. Positive HA.   10-point ROS otherwise negative.  ____________________________________________   PHYSICAL EXAM:  VITAL SIGNS: ED Triage Vitals  Enc Vitals Group     BP 07/20/18 1756 134/83     Pulse Rate 07/20/18 1757 70     Resp 07/20/18 1757 (!) 26     Temp --      Temp src --      SpO2 07/20/18 1757 100 %     Weight 07/20/18 1757 271 lb (122.9 kg)     Height 07/20/18 1757 6\' 2"  (1.88 m)     Pain Score 07/20/18 1757 0   Constitutional: Alert and oriented. Well appearing and in no acute distress. Eyes: Conjunctivae are normal.  Head: Atraumatic. Nose: No congestion/rhinnorhea. Mouth/Throat: Mucous membranes are moist.  Neck: No stridor.  Cardiovascular: Normal rate, regular rhythm. Good peripheral circulation. Grossly normal heart sounds.   Respiratory: Normal respiratory effort.  No retractions. Lungs CTAB. Gastrointestinal: Soft and nontender. No distention.  Musculoskeletal: No lower extremity tenderness nor edema. No gross deformities of extremities. Neurologic:  Normal speech and language. No gross focal neurologic deficits are appreciated.  Skin:  Skin is warm, dry and intact. No rash noted.  ____________________________________________   LABS (all labs  ordered are listed, but only abnormal results are displayed)  Labs Reviewed  COMPREHENSIVE METABOLIC PANEL - Abnormal; Notable for the following components:      Result Value   Glucose, Bld 186 (*)    AST 13 (*)    All other components within normal limits  CBG MONITORING, ED - Abnormal; Notable for the following components:   Glucose-Capillary 183 (*)    All other components within normal limits  CBG MONITORING, ED - Abnormal; Notable for the following components:   Glucose-Capillary 148 (*)    All other components within normal limits  CBC WITH DIFFERENTIAL/PLATELET  I-STAT TROPONIN, ED  I-STAT TROPONIN, ED   ____________________________________________  EKG   EKG Interpretation  Date/Time:  Saturday July 20 2018 17:56:57 EST Ventricular Rate:  71 PR Interval:    QRS Duration: 80 QT Interval:  408 QTC Calculation: 444 R Axis:  60 Text Interpretation:  Sinus rhythm Probable left atrial enlargement Borderline ST elevation, lateral leads No STEMI. Similar ST changes compared to prior.  Confirmed by Alona Bene [phone]) on 07/20/2018 6:05:30 PM       ____________________________________________  RADIOLOGY  Dg Chest 2 View  Result Date: 07/20/2018 CLINICAL DATA:  Dizziness EXAM: CHEST - 2 VIEW COMPARISON:  06/29/2018 FINDINGS: The lungs are clear without focal pneumonia, edema, pneumothorax or pleural effusion. Right parahilar linear atelectasis or scarring evident. The cardiopericardial silhouette is within normal limits for size. The visualized bony structures of the thorax are intact. Telemetry leads overlie the chest. IMPRESSION: No active cardiopulmonary disease. Electronically Signed   By: Kennith Center M.D.   On: 07/20/2018 18:58   Ct Head Wo Contrast  Result Date: 07/20/2018 CLINICAL DATA:  Dizziness EXAM: CT HEAD WITHOUT CONTRAST TECHNIQUE: Contiguous axial images were obtained from the base of the skull through the vertex without intravenous contrast.  COMPARISON:  MRI 09/26/2017. CT scan 420 09/05/2006. FINDINGS: Brain: There is no evidence for acute hemorrhage, hydrocephalus, mass lesion, or abnormal extra-axial fluid collection. No definite CT evidence for acute infarction. Vascular: No hyperdense vessel or unexpected calcification. Skull: No evidence for fracture. No worrisome lytic or sclerotic lesion. Sinuses/Orbits: The visualized paranasal sinuses and mastoid air cells are clear. Visualized portions of the globes and intraorbital fat are unremarkable. Other: None. IMPRESSION: Stable.  Unremarkable CT evaluation of the brain. Electronically Signed   By: Kennith Center M.D.   On: 07/20/2018 18:48    ____________________________________________   PROCEDURES  Procedure(s) performed:   Procedures  None ____________________________________________   INITIAL IMPRESSION / ASSESSMENT AND PLAN / ED COURSE  Pertinent labs & imaging results that were available during my care of the patient were reviewed by me and considered in my medical decision making (see chart for details).  Patient returns to the emergency department for evaluation of near syncope while getting off a bus.  He felt as if his blood pressure dropped and drink [facility] prior to EMS evaluation where his blood sugar was in the 180s.  No chest pain, heart palpitations, shortness of breath.  Extremely low suspicion for ACS, PE, arrhythmia.  Patient's neurological exam is within normal limits.  No focal deficits.  He does have some baseline lower back pain and left leg discomfort but no fevers or signs/symptoms to be concerned regarding acute spine emergency.   07:45 PM Orthostatic vitals are normal.  The patient's labs including troponin have been reviewed with no acute findings.  Plan for food here with continued monitoring and repeat troponin at 3 hour mark. CT head with no acute findings. CXR unremarkable for acute events. Plan for ambulation with pulse ox here.    Ambulation with pulse ox is normal. No symptoms while here in the ED. Normal tele during obs period. Plan for outpatient Cardiology referral. Placed referral in Epic.   At this time, I do not feel there is any life-threatening condition present. I have reviewed and discussed all results (EKG, imaging, lab, urine as appropriate), exam findings with patient. I have reviewed nursing notes and appropriate previous records.  I feel the patient is safe to be discharged home without further emergent workup. Discussed usual and customary return precautions. Patient and family (if present) verbalize understanding and are comfortable with this plan.  Patient will follow-up with their primary care provider. If they do not have a primary care provider, information for follow-up has been provided to them. All questions have  been answered.  ____________________________________________  FINAL CLINICAL IMPRESSION(S) / ED DIAGNOSES  Final diagnoses:  Near syncope    Note:  This document was prepared using Dragon voice recognition software and may include unintentional dictation errors.  Alona Bene, MD Emergency Medicine    Cheryel Kyte, Arlyss Repress, MD 07/21/18 0100

## 2018-07-20 NOTE — ED Notes (Signed)
Lab to draw blood

## 2018-07-20 NOTE — ED Notes (Signed)
Patient transported to X-ray 

## 2018-07-20 NOTE — ED Notes (Signed)
Pt ate bag lunch and ambulated to the bathroom by himself, no problems.

## 2018-07-20 NOTE — Discharge Instructions (Signed)
You were seen in the ED today with almost passing out. Call the PCP listed on Monday as well as the Cardiologist listed. I have placed a referral but you will need to call and make sure the appointment is made. Return to the ED with any new or worsening symptoms.

## 2018-07-20 NOTE — ED Notes (Signed)
Ambulated pt in hallway, pt o2 sats stayed 97%, a few seconds of having 93% but quickly increased. Pt. Stated "he thinks its due to his lower BS than baseline. Usually maintains 200 cbg."

## 2018-08-02 ENCOUNTER — Other Ambulatory Visit (HOSPITAL_COMMUNITY): Payer: Self-pay | Admitting: Respiratory Therapy

## 2018-08-02 DIAGNOSIS — R42 Dizziness and giddiness: Secondary | ICD-10-CM

## 2018-08-02 DIAGNOSIS — R0602 Shortness of breath: Secondary | ICD-10-CM

## 2018-08-02 DIAGNOSIS — F17200 Nicotine dependence, unspecified, uncomplicated: Secondary | ICD-10-CM

## 2018-08-07 ENCOUNTER — Other Ambulatory Visit: Payer: Self-pay

## 2018-08-07 ENCOUNTER — Emergency Department (HOSPITAL_COMMUNITY)
Admission: EM | Admit: 2018-08-07 | Discharge: 2018-08-07 | Disposition: A | Discharge status: Home / Self Care | Payer: Medicaid Other | Attending: Emergency Medicine | Admitting: Emergency Medicine

## 2018-08-07 ENCOUNTER — Encounter (HOSPITAL_COMMUNITY): Payer: Self-pay

## 2018-08-07 DIAGNOSIS — Z794 Long term (current) use of insulin: Secondary | ICD-10-CM | POA: Diagnosis not present

## 2018-08-07 DIAGNOSIS — Z79899 Other long term (current) drug therapy: Secondary | ICD-10-CM | POA: Insufficient documentation

## 2018-08-07 DIAGNOSIS — I10 Essential (primary) hypertension: Secondary | ICD-10-CM | POA: Insufficient documentation

## 2018-08-07 DIAGNOSIS — F17210 Nicotine dependence, cigarettes, uncomplicated: Secondary | ICD-10-CM | POA: Insufficient documentation

## 2018-08-07 DIAGNOSIS — E1165 Type 2 diabetes mellitus with hyperglycemia: Secondary | ICD-10-CM | POA: Insufficient documentation

## 2018-08-07 DIAGNOSIS — R739 Hyperglycemia, unspecified: Secondary | ICD-10-CM

## 2018-08-07 DIAGNOSIS — R42 Dizziness and giddiness: Secondary | ICD-10-CM | POA: Diagnosis not present

## 2018-08-07 LAB — URINALYSIS, ROUTINE W REFLEX MICROSCOPIC
Bacteria, UA: NONE SEEN
Bilirubin Urine: NEGATIVE
Ketones, ur: NEGATIVE mg/dL
Leukocytes, UA: NEGATIVE
Nitrite: NEGATIVE
Protein, ur: NEGATIVE mg/dL
SPECIFIC GRAVITY, URINE: 1.022 (ref 1.005–1.030)
pH: 6 (ref 5.0–8.0)

## 2018-08-07 LAB — COMPREHENSIVE METABOLIC PANEL
ALT: 25 U/L (ref 0–44)
AST: 17 U/L (ref 15–41)
Albumin: 3.5 g/dL (ref 3.5–5.0)
Alkaline Phosphatase: 84 U/L (ref 38–126)
Anion gap: 8 (ref 5–15)
BILIRUBIN TOTAL: 0.6 mg/dL (ref 0.3–1.2)
BUN: 7 mg/dL (ref 6–20)
CALCIUM: 8.8 mg/dL — AB (ref 8.9–10.3)
CHLORIDE: 104 mmol/L (ref 98–111)
CO2: 24 mmol/L (ref 22–32)
CREATININE: 0.9 mg/dL (ref 0.61–1.24)
Glucose, Bld: 319 mg/dL — ABNORMAL HIGH (ref 70–99)
Potassium: 4 mmol/L (ref 3.5–5.1)
Sodium: 136 mmol/L (ref 135–145)
TOTAL PROTEIN: 6.6 g/dL (ref 6.5–8.1)

## 2018-08-07 LAB — CBC
HCT: 45.2 % (ref 39.0–52.0)
HEMOGLOBIN: 13.8 g/dL (ref 13.0–17.0)
MCH: 25.5 pg — ABNORMAL LOW (ref 26.0–34.0)
MCHC: 30.5 g/dL (ref 30.0–36.0)
MCV: 83.5 fL (ref 80.0–100.0)
PLATELETS: 230 10*3/uL (ref 150–400)
RBC: 5.41 MIL/uL (ref 4.22–5.81)
RDW: 14 % (ref 11.5–15.5)
WBC: 7.8 10*3/uL (ref 4.0–10.5)
nRBC: 0 % (ref 0.0–0.2)

## 2018-08-07 LAB — CBG MONITORING, ED
GLUCOSE-CAPILLARY: 314 mg/dL — AB (ref 70–99)
GLUCOSE-CAPILLARY: 183 mg/dL — AB (ref 70–99)

## 2018-08-07 MED ORDER — SODIUM CHLORIDE 0.9 % IV BOLUS
1000.0000 mL | Freq: Once | INTRAVENOUS | Status: AC
  Administered 2018-08-07: 1000 mL via INTRAVENOUS

## 2018-08-07 NOTE — ED Notes (Signed)
Pt ambulated throughout hallway without assistance. Pt endorsed mild dizziness but significantly improved, gait steady throughout.

## 2018-08-07 NOTE — Discharge Instructions (Signed)
Please follow-up with your primary care provider in the next week for recheck.  Please take your afternoon dose of your blood pressure medication as it was high in the emergency department.  Continue to monitor your home blood sugar.  Make sure that you tell your primary care provider about the highs and lows of your blood sugar he been having.  Continue to drink plenty of fluids.  Try to avoid drinks with lots of sugar or a significant amount of caffeine or creamer.  Try to drink sugar-free options.  Return to the emergency department if your blood sugar significantly elevated, if you get confused, develop persistent vomiting, if your blood pressure significantly elevated, develop the worst headache of your life, new numbness or weakness, or other new, concerning symptoms.

## 2018-08-07 NOTE — ED Triage Notes (Signed)
Pt brought in by EMS due feeling weak and dizzy this morning. Pt was sent from MD office. CBG was 423 from EMS. Pt a&ox4.

## 2018-08-07 NOTE — ED Provider Notes (Signed)
[facility] EMERGENCY DEPARTMENT Provider Note   CSN: [id number] Arrival date & time: 08/07/18  1033     History   Chief Complaint Chief Complaint  Patient presents with  . Hyperglycemia    HPI Charles Orr is a 57 y.o. male with a history of diabetes mellitus type 2, schizophrenia, MI, HTN, hyperlipidemia, GERD, fibromyalgia, and bipolar disorder who presents to the emergency department by EMS with a chief complaint of lightheadedness.  The patient endorses intermittent lightheadedness, which she describes feeling as if he might pass out, onset this morning.  He states he was feeling lightheaded prior to eating breakfast and was concerned that his blood sugar may be low.  Lightheadedness was worse with standing.  States it was 172 when he checked it.  He ate some oatmeal for breakfast and then went to a substance abuse class.  He had 2 cups of coffee with creamer.  He reports the lightheadedness returned while he was in his class so EMS was called.  He checked his blood sugar and it was 380.  EMS reports the patient's CBG was 423 prior to arrival.  They attempted to get a line on the patient, but were unsuccessful so no treatments were initiated prior to ED arrival.  He reports that he has had some episodes of lower blood sugar over the last few weeks, but no hyperglycemia.  Include low blood sugar episodes happen in the middle of the night, around 2 to 2:30 AM.  He states he does not check his blood pressure at this time, but he "reaches for something sweet".  States he didn't have to do this last night.  Reports he has had nocturia, but denies polyuria or urinary frequency.  He denies headache, dizziness, chest pain, dyspnea, weakness, dysuria, fever, chills, rash, abdominal pain, nausea, vomiting, diarrhea.   No missed doses of his home medication.  Diet includes a [place] sandwich yesterday for lunch and for dinner.  Home medications include insulin aspart 70/30 daily  with breakfast and dinner and Lantus.  He reports that his Lantus was increased from 20 units at bedtime to 30 units about 1 month ago. No alcohol for the last 9 months.  Denies IV recreational drug use.  The history is provided by the patient. No language interpreter was used.    Past Medical History:  Diagnosis Date  . Anxiety   . Arthritis    "back" (06/12/2017)  . Bipolar disorder (HCC)   . CAP (community acquired pneumonia) 06/10/2017  . Chronic lower back pain   . Degenerative disc disease, lumbar   . Depression   . Fibromyalgia   . GERD (gastroesophageal reflux disease)   . High cholesterol   . Hypertension   . Myocardial infarction (HCC) 2002  . Pneumonia 1989  . Schizophrenia (HCC)   . Type II diabetes mellitus [facility])     Patient Active Problem List   Diagnosis Date Noted  . Bipolar disorder, curr episode mixed, severe, with psychotic features (HCC) 10/08/2017  . Polysubstance (including opioids) dependence with physiological dependence (HCC) 10/06/2017  . 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 Chest pain 07/10/2017  . Angina pectoris (HCC) 07/09/2017  . Tobacco abuse 07/09/2017  . Hypertension 07/09/2017  . Type II diabetes mellitus (HCC)   . GERD (gastroesophageal reflux disease)   . Chronic lower back pain   . Poorly controlled diabetes mellitus (HCC)   . Hyperlipidemia with target LDL less than 70   .  Schizoaffective disorder, bipolar type (HCC) 06/16/2017  . Depression 06/15/2017  . Other schizophrenia (HCC)   . Pneumonia 06/11/2017  . Community acquired pneumonia of right lower lobe of lung (HCC)   . Hyperglycemia   . Localized edema   . Shortness of breath 06/10/2017    Past Surgical History:  Procedure Laterality Date  . CARDIAC CATHETERIZATION  12/2006   [name]/notes 01/18/2011  . INGUINAL HERNIA REPAIR Right   . LEFT HEART CATH AND CORONARY ANGIOGRAPHY N/A 07/11/2017   Procedure: LEFT HEART CATH AND  CORONARY ANGIOGRAPHY;  Surgeon: Tonny Bollmanooper, Michael, MD;  [facility];  Service: Cardiovascular;  Laterality: N/A;  . PARATHYROIDECTOMY    . PERICARDIAL TAP  2000   ?; in [place]/notes 01/18/2011        Home Medications    Prior to Admission medications   Medication Sig Start Date End Date Taking? Authorizing Provider  atorvastatin (LIPITOR) 80 MG tablet Take 80 mg by mouth at bedtime. 06/07/18   [provider]  gabapentin (NEURONTIN) 300 MG capsule Take 1 capsule (300 mg total) by mouth 3 (three) times daily. For agitation 10/16/17   Armandina StammerNwoko, Agnes I, NP  hydrOXYzine (ATARAX/VISTARIL) 50 MG tablet Take 1 tablet (50 mg total) by mouth every 6 (six) hours as needed for anxiety. 10/16/17   Armandina StammerNwoko, Agnes I, NP  ibuprofen (ADVIL,MOTRIN) 200 MG tablet Take 800 mg by mouth every 6 (six) hours as needed for moderate pain.    [provider]  insulin aspart protamine- aspart (NOVOLOG MIX 70/30) (70-30) 100 UNIT/ML injection Inject 0.6 mLs (60 Units total) into the skin daily with breakfast. For diabetes management 10/17/17   Armandina StammerNwoko, Agnes I, NP  insulin aspart protamine- aspart (NOVOLOG MIX 70/30) (70-30) 100 UNIT/ML injection Inject 0.45 mLs (45 Units total) into the skin daily with supper. For diabetes management Patient taking differently: Inject 50 Units into the skin daily with supper. For diabetes management 10/16/17   Armandina StammerNwoko, Agnes I, NP  LANTUS SOLOSTAR 100 UNIT/ML Solostar Pen Inject 30 Units into the skin at bedtime. 06/07/18   [provider]  naproxen sodium (ALEVE) 220 MG tablet Take 220 mg by mouth daily as needed (Pain).    [provider]  pregabalin (LYRICA) 100 MG capsule Take 100 mg by mouth daily. 06/07/18   [provider]  ranitidine (ZANTAC) 150 MG tablet Take 150 mg by mouth daily. 08/11/17   [provider]  risperiDONE (RISPERDAL) 1 MG tablet Take 1 tablet (1 mg) by mouth in the mornings & 2 tablets (2 mg) at bedtime:  For mood control 10/18/17   Sanjuana KavaNwoko, Agnes I, NP    Family History Family History  Problem Relation Age of Onset  . Hypertension Mother   . Diabetes Mother   . Heart disease Father   . Hypertension Sister   . Diabetes Sister   . Hypertension Brother   . Diabetes Brother   . Hypertension Sister   . Diabetes Sister   . Hypertension Brother   . Diabetes Brother     Social History Social History   Tobacco Use  . Smoking status: Current Every Day Smoker    Packs/day: 0.50    Years: 38.00    Pack years: 19.00    Types: Cigarettes  . Smokeless tobacco: Current User    Types: Chew  Substance Use Topics  . Alcohol use: Yes    Comment: 06/12/2017 "stopped ~ 1 yr ago"   . Drug use: Yes  Types: Cocaine    Comment: 06/12/2017 "stopped using cocaine ~ 1 yr ago; used marijuana when I was younger" last cocaine use 9 mths ago per pt on 06/15/17     Allergies   Patient has no known allergies.   Review of Systems Review of Systems  Constitutional: Negative for appetite change, chills and fever.  HENT: Negative for congestion and sore throat.   Respiratory: Negative for cough and shortness of breath.   Cardiovascular: Negative for chest pain.  Gastrointestinal: Negative for abdominal pain, diarrhea, nausea and vomiting.  Endocrine: Negative for polyuria.  Genitourinary: Negative for dysuria and frequency.  Musculoskeletal: Negative for back pain.  Skin: Negative for rash.  Allergic/Immunologic: Negative for immunocompromised state.  Neurological: Positive for light-headedness. Negative for dizziness, weakness and headaches.  Psychiatric/Behavioral: Negative for confusion.   Physical Exam Updated Vital Signs BP (!) 178/92 (BP Location: Left Arm)   Pulse 64   Temp 98.1 F (36.7 C) (Oral)   Resp 18   Ht 6\' 2"  (1.88 m)   Wt 122.9 kg   SpO2 98%   BMI 34.79 kg/m   Physical Exam  Constitutional: He appears well-developed and well-nourished. No distress.  HENT:  Head:  Normocephalic.  Eyes: Conjunctivae are normal.  Neck: Neck supple.  Cardiovascular: Normal rate, regular rhythm, normal heart sounds and intact distal pulses. Exam reveals no gallop and no friction rub.  No murmur heard. Pulmonary/Chest: Effort normal. No stridor. No respiratory distress. He has no wheezes. He has no rales. He exhibits no tenderness.  Abdominal: Soft. He exhibits distension. He exhibits no mass. There is no tenderness. There is no rebound and no guarding. No hernia.  Abdomen is distended, but soft.  Nontender.  Normoactive bowel sounds.  Musculoskeletal: Normal range of motion. He exhibits no edema, tenderness or deformity.  Neurological: He is alert.  Cranial nerves II through XII are grossly intact.  Moves all 4 extremities.  5 out of 5 strength against resistance of bilateral upper and lower extreme knees.  Sensation is intact and equal throughout.  No pronator drift.  Negative Romberg.  Ambulatory without difficulty.  Skin: Skin is warm and dry. He is not diaphoretic.  Psychiatric: His behavior is normal.  Nursing note and vitals reviewed.    ED Treatments / Results  Labs (all labs ordered are listed, but only abnormal results are displayed) Labs Reviewed  CBC - Abnormal; Notable for the following components:      Result Value   MCH 25.5 (*)    All other components within normal limits  URINALYSIS, ROUTINE W REFLEX MICROSCOPIC - Abnormal; Notable for the following components:   Color, Urine STRAW (*)    Glucose, UA >=500 (*)    Hgb urine dipstick SMALL (*)    All other components within normal limits  COMPREHENSIVE METABOLIC PANEL - Abnormal; Notable for the following components:   Glucose, Bld 319 (*)    Calcium 8.8 (*)    All other components within normal limits  CBG MONITORING, ED - Abnormal; Notable for the following components:   Glucose-Capillary 314 (*)    All other components within normal limits  CBG MONITORING, ED - Abnormal; Notable for the  following components:   Glucose-Capillary 183 (*)    All other components within normal limits    EKG None  Radiology No results found.  Procedures Procedures (including critical care time)  Medications Ordered in ED Medications  sodium chloride 0.9 % bolus 1,000 mL (0 mLs Intravenous Stopped 08/07/18  1257)     Initial Impression / Assessment and Plan / ED Course  I have reviewed the triage vital signs and the nursing notes.  Pertinent labs & imaging results that were available during my care of the patient were reviewed by me and considered in my medical decision making (see chart for details).     57 year old male with a history of diabetes mellitus type 2, schizophrenia, MI, HTN, hyperlipidemia, GERD, fibromyalgia, and bipolar disorder today with lightheadedness and hyperglycemia.  CBG is 319 with normal bicarb and anion gap of 8.  UA with glucosuria, but otherwise unremarkable.  The patient has been compliant with his home insulin.  The patient was discussed with Dr. Lockie Mola, attending physician.  IV fluid bolus given in the ED.  Of note, the patient was seen and evaluated in the ED on 06/29/2018 and 07/20/2018 with similar symptoms.  It sounds as if his glucose has been waxing and waning at home.  States he has a follow-up appointment with primary care in the next 1 to 2 weeks.  Repeat CBG is 183.  Patient was ambulatory and denied dizziness or lightheadedness prior to discharge.  Blood pressure prior to discharge is 178/92, but the patient states that he is due for his afternoon dose of his blood pressure medication.  He has no signs or symptoms of hypertensive urgency or emergency prior to discharge.  Doubt HHS or DKA.  Recommended follow-up within the next week with primary care.  Advised the patient to avoid sugary drinks.  Strict return precautions given.  Patient is hemodynamically stable and in no acute distress.  He is safe for discharge to home with outpatient follow-up at  this time.  Final Clinical Impressions(s) / ED Diagnoses   Final diagnoses:  Hyperglycemia  Lightheadedness    ED Discharge Orders         Ordered    Recheck vitals     08/07/18 1315           Barkley Boards, PA-C 08/07/18 1712    Virgina Norfolk, DO 08/07/18 1854

## 2018-08-12 ENCOUNTER — Other Ambulatory Visit (HOSPITAL_COMMUNITY): Payer: Self-pay | Admitting: Respiratory Therapy

## 2018-08-12 DIAGNOSIS — R0602 Shortness of breath: Secondary | ICD-10-CM

## 2018-08-12 DIAGNOSIS — F17210 Nicotine dependence, cigarettes, uncomplicated: Secondary | ICD-10-CM

## 2018-08-12 DIAGNOSIS — R42 Dizziness and giddiness: Secondary | ICD-10-CM

## 2018-08-20 ENCOUNTER — Ambulatory Visit (HOSPITAL_COMMUNITY)
Admission: RE | Admit: 2018-08-20 | Discharge: 2018-08-20 | Disposition: A | Discharge status: Home / Self Care | Payer: Medicaid Other | Source: Ambulatory Visit | Attending: Family Medicine | Admitting: Family Medicine

## 2018-08-20 DIAGNOSIS — F17210 Nicotine dependence, cigarettes, uncomplicated: Secondary | ICD-10-CM | POA: Insufficient documentation

## 2018-08-20 DIAGNOSIS — R0602 Shortness of breath: Secondary | ICD-10-CM | POA: Insufficient documentation

## 2018-08-20 DIAGNOSIS — R42 Dizziness and giddiness: Secondary | ICD-10-CM | POA: Insufficient documentation

## 2018-08-27 ENCOUNTER — Emergency Department (HOSPITAL_COMMUNITY): Payer: Medicaid Other

## 2018-08-27 ENCOUNTER — Emergency Department (HOSPITAL_COMMUNITY)
Admission: EM | Admit: 2018-08-27 | Discharge: 2018-08-27 | Disposition: A | Discharge status: Home / Self Care | Payer: Medicaid Other | Attending: Emergency Medicine | Admitting: Emergency Medicine

## 2018-08-27 DIAGNOSIS — R270 Ataxia, unspecified: Secondary | ICD-10-CM | POA: Diagnosis not present

## 2018-08-27 DIAGNOSIS — Z79899 Other long term (current) drug therapy: Secondary | ICD-10-CM | POA: Insufficient documentation

## 2018-08-27 DIAGNOSIS — R42 Dizziness and giddiness: Secondary | ICD-10-CM | POA: Diagnosis not present

## 2018-08-27 DIAGNOSIS — I252 Old myocardial infarction: Secondary | ICD-10-CM | POA: Insufficient documentation

## 2018-08-27 DIAGNOSIS — Z794 Long term (current) use of insulin: Secondary | ICD-10-CM | POA: Insufficient documentation

## 2018-08-27 DIAGNOSIS — I10 Essential (primary) hypertension: Secondary | ICD-10-CM | POA: Insufficient documentation

## 2018-08-27 DIAGNOSIS — F17210 Nicotine dependence, cigarettes, uncomplicated: Secondary | ICD-10-CM | POA: Insufficient documentation

## 2018-08-27 DIAGNOSIS — E7800 Pure hypercholesterolemia, unspecified: Secondary | ICD-10-CM | POA: Insufficient documentation

## 2018-08-27 DIAGNOSIS — E785 Hyperlipidemia, unspecified: Secondary | ICD-10-CM | POA: Insufficient documentation

## 2018-08-27 DIAGNOSIS — E119 Type 2 diabetes mellitus without complications: Secondary | ICD-10-CM | POA: Insufficient documentation

## 2018-08-27 DIAGNOSIS — H81399 Other peripheral vertigo, unspecified ear: Secondary | ICD-10-CM

## 2018-08-27 LAB — COMPREHENSIVE METABOLIC PANEL
ALT: 25 U/L (ref 0–44)
AST: 20 U/L (ref 15–41)
Albumin: 3.9 g/dL (ref 3.5–5.0)
Alkaline Phosphatase: 72 U/L (ref 38–126)
Anion gap: 10 (ref 5–15)
BILIRUBIN TOTAL: 0.6 mg/dL (ref 0.3–1.2)
BUN: 10 mg/dL (ref 6–20)
CO2: 21 mmol/L — ABNORMAL LOW (ref 22–32)
CREATININE: 1.08 mg/dL (ref 0.61–1.24)
Calcium: 9.2 mg/dL (ref 8.9–10.3)
Chloride: 107 mmol/L (ref 98–111)
Glucose, Bld: 149 mg/dL — ABNORMAL HIGH (ref 70–99)
POTASSIUM: 3.1 mmol/L — AB (ref 3.5–5.1)
Sodium: 138 mmol/L (ref 135–145)
TOTAL PROTEIN: 6.9 g/dL (ref 6.5–8.1)

## 2018-08-27 LAB — CBC WITH DIFFERENTIAL/PLATELET
Abs Immature Granulocytes: 0.05 10*3/uL (ref 0.00–0.07)
Basophils Absolute: 0.1 10*3/uL (ref 0.0–0.1)
Basophils Relative: 1 %
EOS ABS: 0.1 10*3/uL (ref 0.0–0.5)
Eosinophils Relative: 1 %
HEMATOCRIT: 44.6 % (ref 39.0–52.0)
Hemoglobin: 14.5 g/dL (ref 13.0–17.0)
IMMATURE GRANULOCYTES: 1 %
LYMPHS ABS: 2 10*3/uL (ref 0.7–4.0)
Lymphocytes Relative: 21 %
MCH: 26.3 pg (ref 26.0–34.0)
MCHC: 32.5 g/dL (ref 30.0–36.0)
MCV: 80.8 fL (ref 80.0–100.0)
MONO ABS: 0.7 10*3/uL (ref 0.1–1.0)
MONOS PCT: 7 %
NEUTROS PCT: 69 %
Neutro Abs: 6.6 10*3/uL (ref 1.7–7.7)
Platelets: 210 10*3/uL (ref 150–400)
RBC: 5.52 MIL/uL (ref 4.22–5.81)
RDW: 13.6 % (ref 11.5–15.5)
WBC: 9.5 10*3/uL (ref 4.0–10.5)
nRBC: 0 % (ref 0.0–0.2)

## 2018-08-27 LAB — CBG MONITORING, ED: Glucose-Capillary: 129 mg/dL — ABNORMAL HIGH (ref 70–99)

## 2018-08-27 LAB — LIPASE, BLOOD: Lipase: 21 U/L (ref 11–51)

## 2018-08-27 MED ORDER — METOCLOPRAMIDE HCL 5 MG/ML IJ SOLN
10.0000 mg | Freq: Once | INTRAMUSCULAR | Status: DC
  Filled 2018-08-27: qty 2

## 2018-08-27 MED ORDER — SODIUM CHLORIDE 0.9 % IV SOLN: INTRAVENOUS | Status: DC

## 2018-08-27 MED ORDER — DIPHENHYDRAMINE HCL 50 MG/ML IJ SOLN
12.5000 mg | Freq: Once | INTRAMUSCULAR | Status: DC
  Filled 2018-08-27: qty 1

## 2018-08-27 MED ORDER — SODIUM CHLORIDE 0.9 % IV BOLUS
1000.0000 mL | Freq: Once | INTRAVENOUS | Status: AC
  Administered 2018-08-27: 1000 mL via INTRAVENOUS

## 2018-08-27 MED ORDER — MECLIZINE HCL 25 MG PO TABS: 25.0000 mg | ORAL_TABLET | Freq: Three times a day (TID) | ORAL | 0 refills | Status: DC | PRN

## 2018-08-27 MED ORDER — POTASSIUM CHLORIDE CRYS ER 20 MEQ PO TBCR
40.0000 meq | EXTENDED_RELEASE_TABLET | Freq: Once | ORAL | Status: AC
  Administered 2018-08-27: 40 meq via ORAL
  Filled 2018-08-27: qty 2

## 2018-08-27 NOTE — ED Notes (Signed)
Patient transported to MRI 

## 2018-08-27 NOTE — ED Provider Notes (Addendum)
[facility] Provider Note   CSN: [id number] Arrival date & time: 08/27/18  1403     History   Chief Complaint No chief complaint on file.   HPI Charles Orr is a 57 y.o. male.  57 year old male presents with acute onset of dizziness while on a bus. .  Had a mild headache which is since resolved.  States his dizziness was worse with head motions.  Notes some ataxia as well 2.  Does have a history of peripheral vertigo.  Denied any slurred speech or weakness in his arms or legs.  No chest pain or chest pressure.  Has had similar symptoms associated with hypoglycemia which she says for him is when his sugars below 180.  Patient felt he might be hypoglycemic and a an ice cream cookie and then developed emesis.  He notes mild epigastric discomfort which is since resolved.  EMS was called and patient was noted to have nystagmus as well as he was diaphoretic.  Patient was initially felt to be a possible code STEMI and was taken to the heart catheterization lab but the activation was canceled and he was sent to the ED for further management.     Past Medical History:  Diagnosis Date  . Anxiety   . Arthritis    "back" (06/12/2017)  . Bipolar disorder (HCC)   . CAP (community acquired pneumonia) 06/10/2017  . Chronic lower back pain   . Degenerative disc disease, lumbar   . Depression   . Fibromyalgia   . GERD (gastroesophageal reflux disease)   . High cholesterol   . Hypertension   . Myocardial infarction (HCC) 2002  . Pneumonia 1989  . Schizophrenia (HCC)   . Type II diabetes mellitus [facility](HCC)     Patient Active Problem List   Diagnosis Date Noted  . Bipolar disorder, curr episode mixed, severe, with psychotic features (HCC) 10/08/2017  . Polysubstance (including opioids) dependence with physiological dependence (HCC) 10/06/2017  . 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 Chest pain  07/10/2017  . Angina pectoris (HCC) 07/09/2017  . Tobacco abuse 07/09/2017  . Hypertension 07/09/2017  . Type II diabetes mellitus (HCC)   . GERD (gastroesophageal reflux disease)   . Chronic lower back pain   . Poorly controlled diabetes mellitus (HCC)   . Hyperlipidemia with target LDL less than 70   . Schizoaffective disorder, bipolar type (HCC) 06/16/2017  . Depression 06/15/2017  . Other schizophrenia (HCC)   . Pneumonia 06/11/2017  . Community acquired pneumonia of right lower lobe of lung (HCC)   . Hyperglycemia   . Localized edema   . Shortness of breath 06/10/2017    Past Surgical History:  Procedure Laterality Date  . CARDIAC CATHETERIZATION  12/2006   [name]/notes 01/18/2011  . INGUINAL HERNIA REPAIR Right   . LEFT HEART CATH AND CORONARY ANGIOGRAPHY N/A 07/11/2017   Procedure: LEFT HEART CATH AND CORONARY ANGIOGRAPHY;  Surgeon: Tonny Bollmanooper, Michael, MD;  [facility];  Service: Cardiovascular;  Laterality: N/A;  . PARATHYROIDECTOMY    . PERICARDIAL TAP  2000   ?; in [place]/notes 01/18/2011        Home Medications    Prior to Admission medications   Medication Sig Start Date End Date Taking? Authorizing Provider  atorvastatin (LIPITOR) 80 MG tablet Take 80 mg by mouth at bedtime. 06/07/18   [provider]  gabapentin (NEURONTIN) 300 MG capsule Take 1 capsule (300 mg total)  by mouth 3 (three) times daily. For agitation 10/16/17   Armandina StammerNwoko, Agnes I, NP  hydrOXYzine (ATARAX/VISTARIL) 50 MG tablet Take 1 tablet (50 mg total) by mouth every 6 (six) hours as needed for anxiety. 10/16/17   Armandina StammerNwoko, Agnes I, NP  ibuprofen (ADVIL,MOTRIN) 200 MG tablet Take 800 mg by mouth every 6 (six) hours as needed for moderate pain.    [provider]  insulin aspart protamine- aspart (NOVOLOG MIX 70/30) (70-30) 100 UNIT/ML injection Inject 0.6 mLs (60 Units total) into the skin daily with breakfast. For diabetes management 10/17/17   Armandina StammerNwoko, Agnes I, NP  insulin aspart  protamine- aspart (NOVOLOG MIX 70/30) (70-30) 100 UNIT/ML injection Inject 0.45 mLs (45 Units total) into the skin daily with supper. For diabetes management Patient taking differently: Inject 50 Units into the skin daily with supper. For diabetes management 10/16/17   Armandina StammerNwoko, Agnes I, NP  LANTUS SOLOSTAR 100 UNIT/ML Solostar Pen Inject 30 Units into the skin at bedtime. 06/07/18   [provider]  naproxen sodium (ALEVE) 220 MG tablet Take 220 mg by mouth daily as needed (Pain).    [provider]  pregabalin (LYRICA) 100 MG capsule Take 100 mg by mouth daily. 06/07/18   [provider]  ranitidine (ZANTAC) 150 MG tablet Take 150 mg by mouth daily. 08/11/17   [provider]  risperiDONE (RISPERDAL) 1 MG tablet Take 1 tablet (1 mg) by mouth in the mornings & 2 tablets (2 mg) at bedtime: For mood control 10/18/17   Sanjuana KavaNwoko, Agnes I, NP    Family History Family History  Problem Relation Age of Onset  . Hypertension Mother   . Diabetes Mother   . Heart disease Father   . Hypertension Sister   . Diabetes Sister   . Hypertension Brother   . Diabetes Brother   . Hypertension Sister   . Diabetes Sister   . Hypertension Brother   . Diabetes Brother     Social History Social History   Tobacco Use  . Smoking status: Current Every Day Smoker    Packs/day: 0.50    Years: 38.00    Pack years: 19.00    Types: Cigarettes  . Smokeless tobacco: Current User    Types: Chew  Substance Use Topics  . Alcohol use: Yes    Comment: 06/12/2017 "stopped ~ 1 yr ago"   . Drug use: Yes    Types: Cocaine    Comment: 06/12/2017 "stopped using cocaine ~ 1 yr ago; used marijuana when I was younger" last cocaine use 9 mths ago per pt on 06/15/17     Allergies   Patient has no known allergies.   Review of Systems Review of Systems  All other systems reviewed and are negative.    Physical Exam Updated Vital Signs BP (!) 163/105   Pulse 61   Temp 98 F (36.7 C)  (Oral)   Resp (!) 37   SpO2 97%   Physical Exam Vitals signs and nursing note reviewed.  Constitutional:      General: He is not in acute distress.    Appearance: Normal appearance. He is well-developed. He is not toxic-appearing.  HENT:     Head: Normocephalic and atraumatic.  Eyes:     General: Lids are normal.     Conjunctiva/sclera: Conjunctivae normal.     Pupils: Pupils are equal, round, and reactive to light.  Neck:     Musculoskeletal: Normal range of motion and neck supple.  Thyroid: No thyroid mass.     Trachea: No tracheal deviation.  Cardiovascular:     Rate and Rhythm: Normal rate and regular rhythm.     Heart sounds: Normal heart sounds. No murmur. No gallop.   Pulmonary:     Effort: Pulmonary effort is normal. No respiratory distress.     Breath sounds: Normal breath sounds. No stridor. No decreased breath sounds, wheezing, rhonchi or rales.  Abdominal:     General: Bowel sounds are normal. There is no distension.     Palpations: Abdomen is soft.     Tenderness: There is no abdominal tenderness. There is no guarding or rebound.  Musculoskeletal: Normal range of motion.        General: No tenderness.  Skin:    General: Skin is warm and dry.     Findings: No abrasion or rash.  Neurological:     Mental Status: He is alert and oriented to person, place, and time.     GCS: GCS eye subscore is 4. GCS verbal subscore is 5. GCS motor subscore is 6.     Cranial Nerves: No cranial nerve deficit.     Sensory: No sensory deficit.     Motor: No weakness.     Coordination: Finger-Nose-Finger Test normal.     Comments: Vertical nystagmus noted without dysmetria Strength is 5/5 in all 4 extremities  Psychiatric:        Speech: Speech normal.        Behavior: Behavior normal.     Comments: Patient is hard of hearing      ED Treatments / Results  Labs (all labs ordered are listed, but only abnormal results are displayed) Labs Reviewed  CBC WITH  DIFFERENTIAL/PLATELET  COMPREHENSIVE METABOLIC PANEL  LIPASE, BLOOD  CBG MONITORING, ED    EKG EKG Interpretation  Date/Time:  Tuesday August 27 2018 14:11:25 EST Ventricular Rate:  57 PR Interval:    QRS Duration: 88 QT Interval:  446 QTC Calculation: 435 R Axis:   59 Text Interpretation:  Sinus rhythm Consider left atrial enlargement Minimal ST elevation, anterior leads Confirmed by Lorre Nick ([id number]) on 08/27/2018 2:18:59 PM   Radiology No results found.  Procedures Procedures (including critical care time)  Medications Ordered in ED Medications  sodium chloride 0.9 % bolus 1,000 mL (has no administration in time range)  0.9 %  sodium chloride infusion (has no administration in time range)  metoCLOPramide (REGLAN) injection 10 mg (has no administration in time range)  diphenhydrAMINE (BENADRYL) injection 12.5 mg (has no administration in time range)     Initial Impression / Assessment and Plan / ED Course  I have reviewed the triage vital signs and the nursing notes.  Pertinent labs & imaging results that were available during my care of the patient were reviewed by me and considered in my medical decision making (see chart for details).     Pt with vertigo that is suspicious for central cause due to ataxia abd exam benign Medicated for sx and potassium replinished Patient's brain MRI negative.  His symptoms have greatly improved.  Likely from peripheral vertigo.  Blood sugar noted and both are in 20.  Stable for discharge   Final Clinical Impressions(s) / ED Diagnoses   Final diagnoses:  None    ED Discharge Orders    None       Lorre Nick, MD 08/27/18 1536    Lorre Nick, MD 08/27/18 1606

## 2018-08-27 NOTE — ED Notes (Signed)
Patient refused to take his clothing off for MRI

## 2018-08-27 NOTE — ED Triage Notes (Signed)
Patient arrived via [facility] from a bus stop. EMS reports that patient has nausea, vomiting, nystagmus, and diaphoresis. He reported to them that he felt his blood sugar was low, so he ate a bar of ice-cream cookie. On arrival, Patient denies chest pain, appears lethargic, he is alert and oriented, continues to reports nausea.

## 2018-08-27 NOTE — ED Notes (Signed)
D/c reviewed with patient 

## 2018-11-11 ENCOUNTER — Encounter: Payer: Self-pay | Admitting: Cardiology

## 2018-11-11 DIAGNOSIS — R42 Dizziness and giddiness: Secondary | ICD-10-CM | POA: Insufficient documentation

## 2018-11-11 NOTE — Progress Notes (Signed)
The ER she is   Cardiology Office Note    Date:  11/12/2018   ID:  Charles Orr, DOB 22-Mar-1961, MRN [id number]  PCP:  Zachery Dauer, FNP  Cardiologist:  Armanda Magic, MD   Chief Complaint  Patient presents with  . New Patient (Initial Visit)    Dizziness and near syncope    History of Present Illness:  Charles Orr is a 58 y.o. male who is being seen today for the evaluation of dizziness and near syncope at the request of Long, Arlyss Repress, MD.  Is a very pleasant 58 year old male who has a history of bipolar disorder, cocaine abuse, diabetes mellitus, GERD, hyperlipidemia, hypertension, schizoaffective disorder who is been seen several times  in the emergency room for episodes of dizziness.  He carries a diagnosis of unstable angina in 2018 as well as a remote MI in 2002 and had a heart catheterization in 07/24/2017 which showed essentially normal coronary arteries and normal LV function by echo.    He tells me that he had problems with dizziness when he lived in [place] that was very bad and was evaluated down there and was diagnosed with vertigo.  His symptoms then improved and then he moved to [facility] and has now got the vertigo back again.  He describes it as a severe spinning of the room that is worse with certain head movements and associated with nausea.    Looking back at his hospital records, he has had multiple hospital ER visits all for dizziness.  His ER visit in October was felt to be due to hypoglycemia.  He was seen in November with near syncope while getting off a bus.  He said he felt like his blood pressure was low and he drank a [employer] prior to EMS evaluation.  His blood sugar was normal at the time at 180.  Orthostatic vitals were normal.  Troponin was normal.  He was recommended at that time to follow-up with cardiology for further evaluation.    He had another episode in December where he states that he was on a bus riding at that time and had a mild headache.   He started getting dizzy spells which became worse with any movement of his head.  He had some ataxia.  He does have a history of vertigo in the past.  He denied any slurred speech or weakness in his arms or legs.  There was no associated chest pain or pressure or shortness of breath.  He says he thinks he may have passed out for 30 seconds.  So I gave you that tells who does what that this is a he was diaphoretic on arrival of EMS with some nystagmus.  EKG initially worrisome for a code STEMI and he was taken to the Cath Lab but after review of the EKG the STEMI was canceled he was sent to the ER for further management.  MRI of the brain was negative.  It was felt that he had vertigo.  EKG on 12/24 showed mild sinus bradycardia 57 bpm.  He is now referred for further evaluation with cardiology.  He is here today and is doing well.  He denies any chest pain or pressure, SOB, DOE, PND, orthopnea, LE edema, palpitations. He is compliant with his meds and is tolerating meds with no SE.    Past Medical History:  Diagnosis Date  . Angina pectoris (HCC) 07/09/2017  . Anxiety   . Arthritis    "back" (  06/12/2017)  . Bipolar disorder, curr episode mixed, severe, with psychotic features (HCC) 10/08/2017  . CAP (community acquired pneumonia) 06/10/2017  . Chest pain 07/10/2017  . Chronic lower back pain   . 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 Community acquired pneumonia of right lower lobe of lung (HCC)   . Degenerative disc disease, lumbar   . Depression   . DM (diabetes mellitus) (HCC)   . Fibromyalgia   . GERD (gastroesophageal reflux disease)   . High cholesterol   . Hyperglycemia   . Hypertension   . Localized edema   . Myocardial infarction (HCC) 2002  . Osteoarthritis   . Other schizophrenia (HCC)   . Pneumonia 1989  . Polysubstance (including opioids) dependence with physiological dependence (HCC) 10/06/2017  . Schizoaffective disorder,  bipolar type (HCC) 06/16/2017  . Schizophrenia (HCC)   . Shortness of breath 06/10/2017  . Tobacco abuse 07/09/2017  . Type II diabetes mellitus (HCC)     Past Surgical History:  Procedure Laterality Date  . CARDIAC CATHETERIZATION  12/2006   [name] 01/18/2011  . INGUINAL HERNIA REPAIR Right   . LEFT HEART CATH AND CORONARY ANGIOGRAPHY N/A 07/11/2017   Procedure: LEFT HEART CATH AND CORONARY ANGIOGRAPHY;  Surgeon: Tonny Bollman, MD;  Location: [facility] INVASIVE CV LAB;  Service: Cardiovascular;  Laterality: N/A;  . PARATHYROIDECTOMY    . PERICARDIAL TAP  2000   ?; in [place]/notes 01/18/2011    Current Medications: Current Meds  Medication Sig  . albuterol (PROVENTIL HFA;VENTOLIN HFA) 108 (90 Base) MCG/ACT inhaler Inhale 2 puffs into the lungs every 6 (six) hours as needed for wheezing or shortness of breath.  Marland Kitchen atorvastatin (LIPITOR) 80 MG tablet Take 80 mg by mouth at bedtime.  . cloNIDine HCl (KAPVAY) 0.1 MG TB12 ER tablet Take 0.1 mg by mouth at bedtime.  . hydrOXYzine (ATARAX/VISTARIL) 50 MG tablet Take 1 tablet (50 mg total) by mouth every 6 (six) hours as needed for anxiety.  Marland Kitchen ibuprofen (ADVIL,MOTRIN) 200 MG tablet Take 800 mg by mouth every 6 (six) hours as needed for moderate pain.  Marland Kitchen LANTUS SOLOSTAR 100 UNIT/ML Solostar Pen Inject 30 Units into the skin at bedtime.  . meclizine (ANTIVERT) 25 MG tablet Take 1 tablet (25 mg total) by mouth 3 (three) times daily as needed for dizziness.  . METFORMIN HCL ER PO Take 1,000 mg by mouth 2 (two) times daily.  . Metoprolol Succinate 25 MG CS24 Take by mouth.  . naproxen sodium (ALEVE) 220 MG tablet Take 220 mg by mouth daily as needed (Pain).  . NOVOLOG FLEXPEN 100 UNIT/ML FlexPen Inject 8 Units into the skin as directed.  . pregabalin (LYRICA) 100 MG capsule Take 100 mg by mouth daily.  . ranitidine (ZANTAC) 150 MG tablet Take 150 mg by mouth daily.  . risperiDONE (RISPERDAL) 1 MG tablet Take 1 tablet (1 mg) by mouth in the mornings & 2  tablets (2 mg) at bedtime: For mood control    Allergies:   Patient has no known allergies.   Social History   Socioeconomic History  . Marital status: Divorced    Spouse name: Not on file  . Number of children: Not on file  . Years of education: Not on file  . Highest education level: Not on file  Occupational History  . Not on file  Social Needs  . Financial resource strain: Not on file  . Food insecurity:    Worry: Not on file  Inability: Not on file  . Transportation needs:    Medical: Not on file    Non-medical: Not on file  Tobacco Use  . Smoking status: Current Every Day Smoker    Packs/day: 0.50    Years: 38.00    Pack years: 19.00    Types: Cigarettes  . Smokeless tobacco: Current User    Types: Chew  Substance and Sexual Activity  . Alcohol use: Yes    Comment: 06/12/2017 "stopped ~ 1 yr ago"   . Drug use: Yes    Types: Cocaine    Comment: 06/12/2017 "stopped using cocaine ~ 1 yr ago; used marijuana when I was younger" last cocaine use 9 mths ago per pt on 06/15/17  . Sexual activity: Not Currently  Lifestyle  . Physical activity:    Days per week: Not on file    Minutes per session: Not on file  . Stress: Not on file  Relationships  . Social connections:    Talks on phone: Not on file    Gets together: Not on file    Attends religious service: Not on file    Active member of club or organization: Not on file    Attends meetings of clubs or organizations: Not on file    Relationship status: Not on file  Other Topics Concern  . Not on file  Social History Narrative  . Not on file     Family History:  The patient's family history includes Diabetes in his brother, brother, mother, sister, and sister; Heart disease in his father; Hypertension in his brother, brother, mother, sister, and sister.   ROS:   Please see the history of present illness.    ROS All other systems reviewed and are negative.  No flowsheet data found.     PHYSICAL EXAM:     VS:  BP (!) 146/92   Pulse 84   Ht 6\' 2"  (1.88 m)   Wt 280 lb 9.6 oz (127.3 kg)   BMI 36.03 kg/m     Orthostatic VS for the past 24 hrs (Last 3 readings):  BP- Lying Pulse- Lying BP- Sitting Pulse- Sitting BP- Standing at 0 minutes Pulse- Standing at 0 minutes BP- Standing at 3 minutes Pulse- Standing at 3 minutes  11/12/18 0914 (!) 148/96 80 132/87 84 127/84 91 (!) 142/93 88   GEN: Well nourished, well developed, in no acute distress  HEENT: normal  Neck: no JVD, carotid bruits, or masses Cardiac: RRR; no murmurs, rubs, or gallops,no edema.  Intact distal pulses bilaterally.  Respiratory:  clear to auscultation bilaterally, normal work of breathing GI: soft, nontender, nondistended, + BS MS: no deformity or atrophy  Skin: warm and dry, no rash Neuro:  Alert and Oriented x 3, Strength and sensation are intact Psych: euthymic mood, full affect  Wt Readings from Last 3 Encounters:  11/12/18 280 lb 9.6 oz (127.3 kg)  08/07/18 270 lb 15.1 oz (122.9 kg)  07/20/18 271 lb (122.9 kg)      Studies/Labs Reviewed:   EKG:  EKG is ordered today.  The ekg ordered today demonstrates   Recent Labs: 08/27/2018: ALT 25; BUN 10; Creatinine, Ser 1.08; Hemoglobin 14.5; Platelets 210; Potassium 3.1; Sodium 138   Lipid Panel    Component Value Date/Time   CHOL 85 06/10/2017 2122   TRIG 101 06/10/2017 2122   HDL 22 (L) 06/10/2017 2122   CHOLHDL 3.9 06/10/2017 2122   VLDL 20 06/10/2017 2122   LDLCALC 43 06/10/2017 2122  Additional studies/ records that were reviewed today include:  Hospital note from ER and PCP    ASSESSMENT:    1. Dizziness   2. Essential hypertension      PLAN:  In order of problems listed above:  1.  Dizziness -his symptoms sound more consistent with vertigo.  He had the symptoms when he lived in [place] and actually more severe than they are now had been evaluated and was told he had vertigo.  His symptoms stop for a while and then came back when he  moved to [place].  He had a recent episode of vertigo while on a bus with questionable transient LOC but really unclear to me whether he truly lost consciousness.  He says he thinks he was out 30 seconds.  It was enough though for EMS to be called.  Evaluation in the ER he was felt to have vertigo.  He is not orthostatic on exam today.  EKG 08/27/2018 showed normal sinus rhythm with minimal early repolarization.  QTc was normal.  I do not think this is coronary ischemia.  He had a cardiac cath in November 2018 with normal coronary arteries.  He has no other anginal symptoms.  2D echo in 2018 was normal as well.  I have recommended a 30-day event monitor to evaluate further.  I will also get a repeat 2D echo to make sure nothing is changed.  I am going to refer him to neurology for work-up of possible vertigo.  2.  Hypertension - blood pressure is mildly elevated on exam today.  He will continue on Toprol-XL 25 mg daily.    Medication Adjustments/Labs and Tests Ordered: Current medicines are reviewed at length with the patient today.  Concerns regarding medicines are outlined above.  Medication changes, Labs and Tests ordered today are listed in the Patient Instructions below.  There are no Patient Instructions on file for this visit.   Signed, Armanda Magic, MD  11/12/2018 9:21 AM    [facility] [address] [place], [place], [place]  [address] Phone: [phone]; Fax: [phone]

## 2018-11-12 ENCOUNTER — Ambulatory Visit (INDEPENDENT_AMBULATORY_CARE_PROVIDER_SITE_OTHER): Payer: Medicaid Other | Admitting: Cardiology

## 2018-11-12 ENCOUNTER — Encounter: Payer: Self-pay | Admitting: Cardiology

## 2018-11-12 ENCOUNTER — Encounter: Payer: Self-pay | Admitting: Neurology

## 2018-11-12 VITALS — BP 146/92 | HR 84 | Ht 74.0 in | Wt 280.6 lb

## 2018-11-12 DIAGNOSIS — I10 Essential (primary) hypertension: Secondary | ICD-10-CM

## 2018-11-12 DIAGNOSIS — R55 Syncope and collapse: Secondary | ICD-10-CM | POA: Diagnosis not present

## 2018-11-12 DIAGNOSIS — R42 Dizziness and giddiness: Secondary | ICD-10-CM

## 2018-11-12 NOTE — Patient Instructions (Addendum)
Medication Instructions:  Your physician recommends that you continue on your current medications as directed. Please refer to the Current Medication list given to you today.  If you need a refill on your cardiac medications before your next appointment, please call your pharmacy.   Lab work: None If you have labs (blood work) drawn today and your tests are completely normal, you will receive your results only by: Marland Kitchen MyChart Message (if you have MyChart) OR . A paper copy in the mail If you have any lab test that is abnormal or we need to change your treatment, we will call you to review the results.  Testing/Procedures: Your physician has recommended that you wear an event monitor. Event monitors are medical devices that record the heart's electrical activity. Doctors most often [place] these monitors to diagnose arrhythmias. Arrhythmias are problems with the speed or rhythm of the heartbeat. The monitor is a small, portable device. You can wear one while you do your normal daily activities. This is usually used to diagnose what is causing palpitations/syncope (passing out).  Your physician has requested that you have an echocardiogram. Echocardiography is a painless test that uses sound waves to create images of your heart. It provides your doctor with information about the size and shape of your heart and how well your heart's chambers and valves are working. This procedure takes approximately one hour. There are no restrictions for this procedure.  Follow-Up: As needed following results.  You have been referred to Neurology, Dr. Arbutus Leas

## 2018-11-22 ENCOUNTER — Ambulatory Visit (HOSPITAL_COMMUNITY): Payer: Medicaid Other

## 2018-12-20 ENCOUNTER — Telehealth: Payer: Self-pay | Admitting: Internal Medicine

## 2018-12-20 NOTE — Telephone Encounter (Signed)
Patient seen recently in clinic by T Turner for dizziness Echo in 2018 was normal    Repeat echo scheduled Given corona virus pandemic and recomm to minimze exposure , would recomm postponing to later this summer (July) when safer from infection standpoint   Will forward  To T Turner

## 2018-12-21 NOTE — Telephone Encounter (Signed)
agree

## 2018-12-24 ENCOUNTER — Ambulatory Visit (HOSPITAL_COMMUNITY): Payer: Medicaid Other

## 2018-12-24 ENCOUNTER — Telehealth: Payer: Self-pay | Admitting: Radiology

## 2018-12-24 NOTE — Telephone Encounter (Signed)
Cancelled patients appt for today and enrolled patient for a 30 day [employer] Event monitor to be mailed due to Covid-19. Left message with instructions and for him to expect a call from the company within 24 hours to verify his address before they will ship monitor.

## 2018-12-27 ENCOUNTER — Ambulatory Visit (INDEPENDENT_AMBULATORY_CARE_PROVIDER_SITE_OTHER): Payer: Medicaid Other

## 2018-12-27 DIAGNOSIS — R42 Dizziness and giddiness: Secondary | ICD-10-CM

## 2018-12-30 ENCOUNTER — Telehealth: Payer: Self-pay

## 2018-12-30 DIAGNOSIS — I10 Essential (primary) hypertension: Secondary | ICD-10-CM

## 2018-12-30 NOTE — Telephone Encounter (Signed)
Confirmed with the patient he needs to STOP TOPROL. He is concerned because this is what he uses to treat HTN. He understands he will be called tomorrow with medication instructions.  Of note- the patient was taking Toprol 25 mg BID.

## 2018-12-30 NOTE — Telephone Encounter (Signed)
Follow up  ° ° °Patient is returning your call. °

## 2018-12-30 NOTE — Telephone Encounter (Signed)
Stop Toprol and start Losartan 25mg  daily and check BP daily for a week and call with results.  He will need a BMET in 1 week

## 2018-12-30 NOTE — Telephone Encounter (Signed)
Late entry from 1300 today:  Received tele strips from [employer] showing a 5.3 s pause on 12/28/2018 at 1022PM CST. The patient states he was sleeping and had no symptoms at all.   Reviewed with Dr. Elease Hashimoto (DOD). Per Dr. Elease Hashimoto, called the patient to instruct him to STOP TOPROL. Left message with instructions and to call back tomorrow to confirm.  To Dr. Mayford Knife (primary cardiologist) as Lorain Childes.

## 2018-12-31 MED ORDER — LOSARTAN POTASSIUM 25 MG PO TABS: 25.0000 mg | ORAL_TABLET | Freq: Every day | ORAL | 3 refills | Status: AC

## 2018-12-31 NOTE — Addendum Note (Signed)
Addended by: Dustin Flock on: 12/31/2018 11:17 AM   Modules accepted: Orders

## 2019-01-07 ENCOUNTER — Other Ambulatory Visit: Payer: Medicaid Other

## 2019-01-24 IMAGING — CR DG CHEST 2V
2 series · 2 of 2 positions shown · non-contrast
Comparison: September 26, 2017

CLINICAL DATA: Dizziness.

EXAM:
CHEST - 2 VIEW

[chest pa]
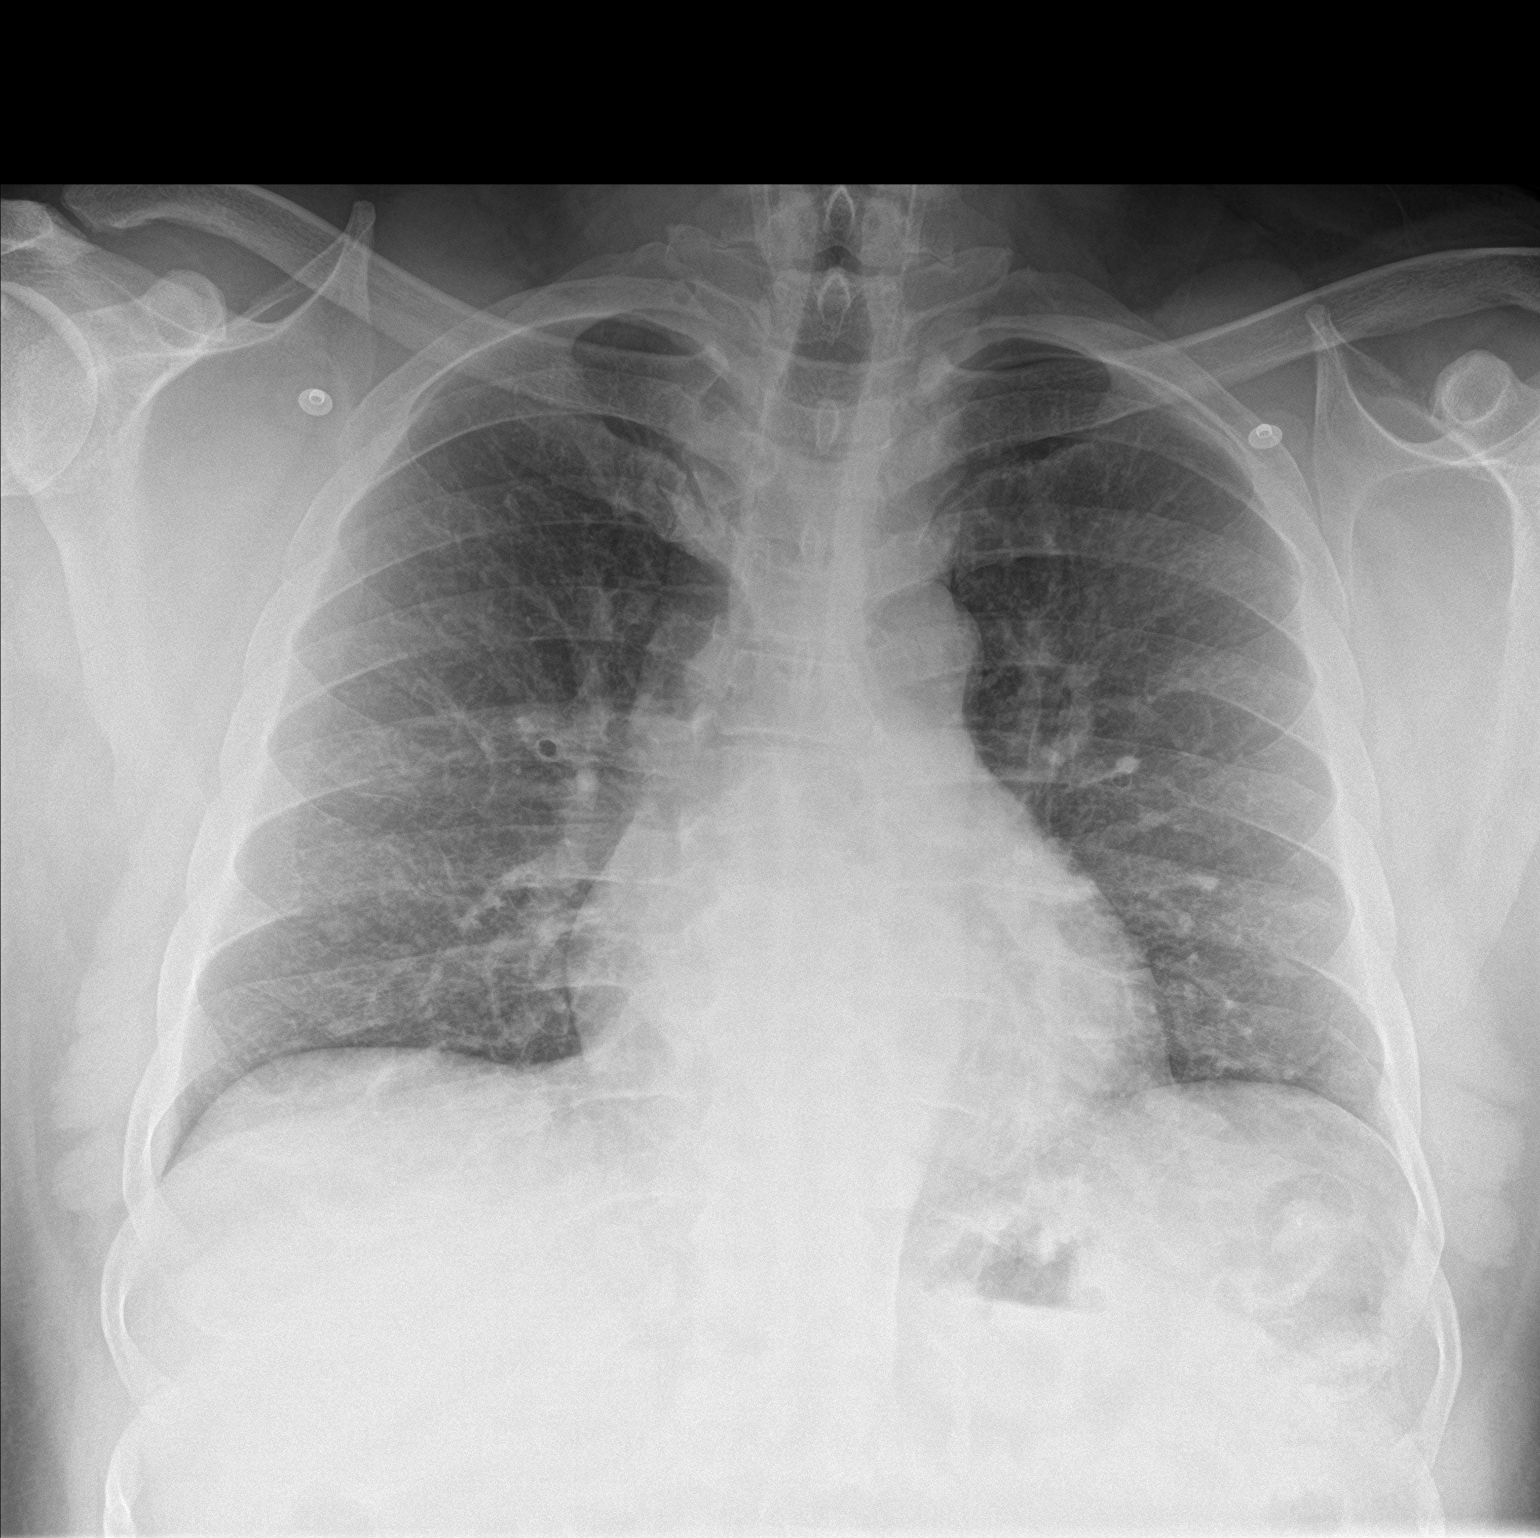

[chest lat]
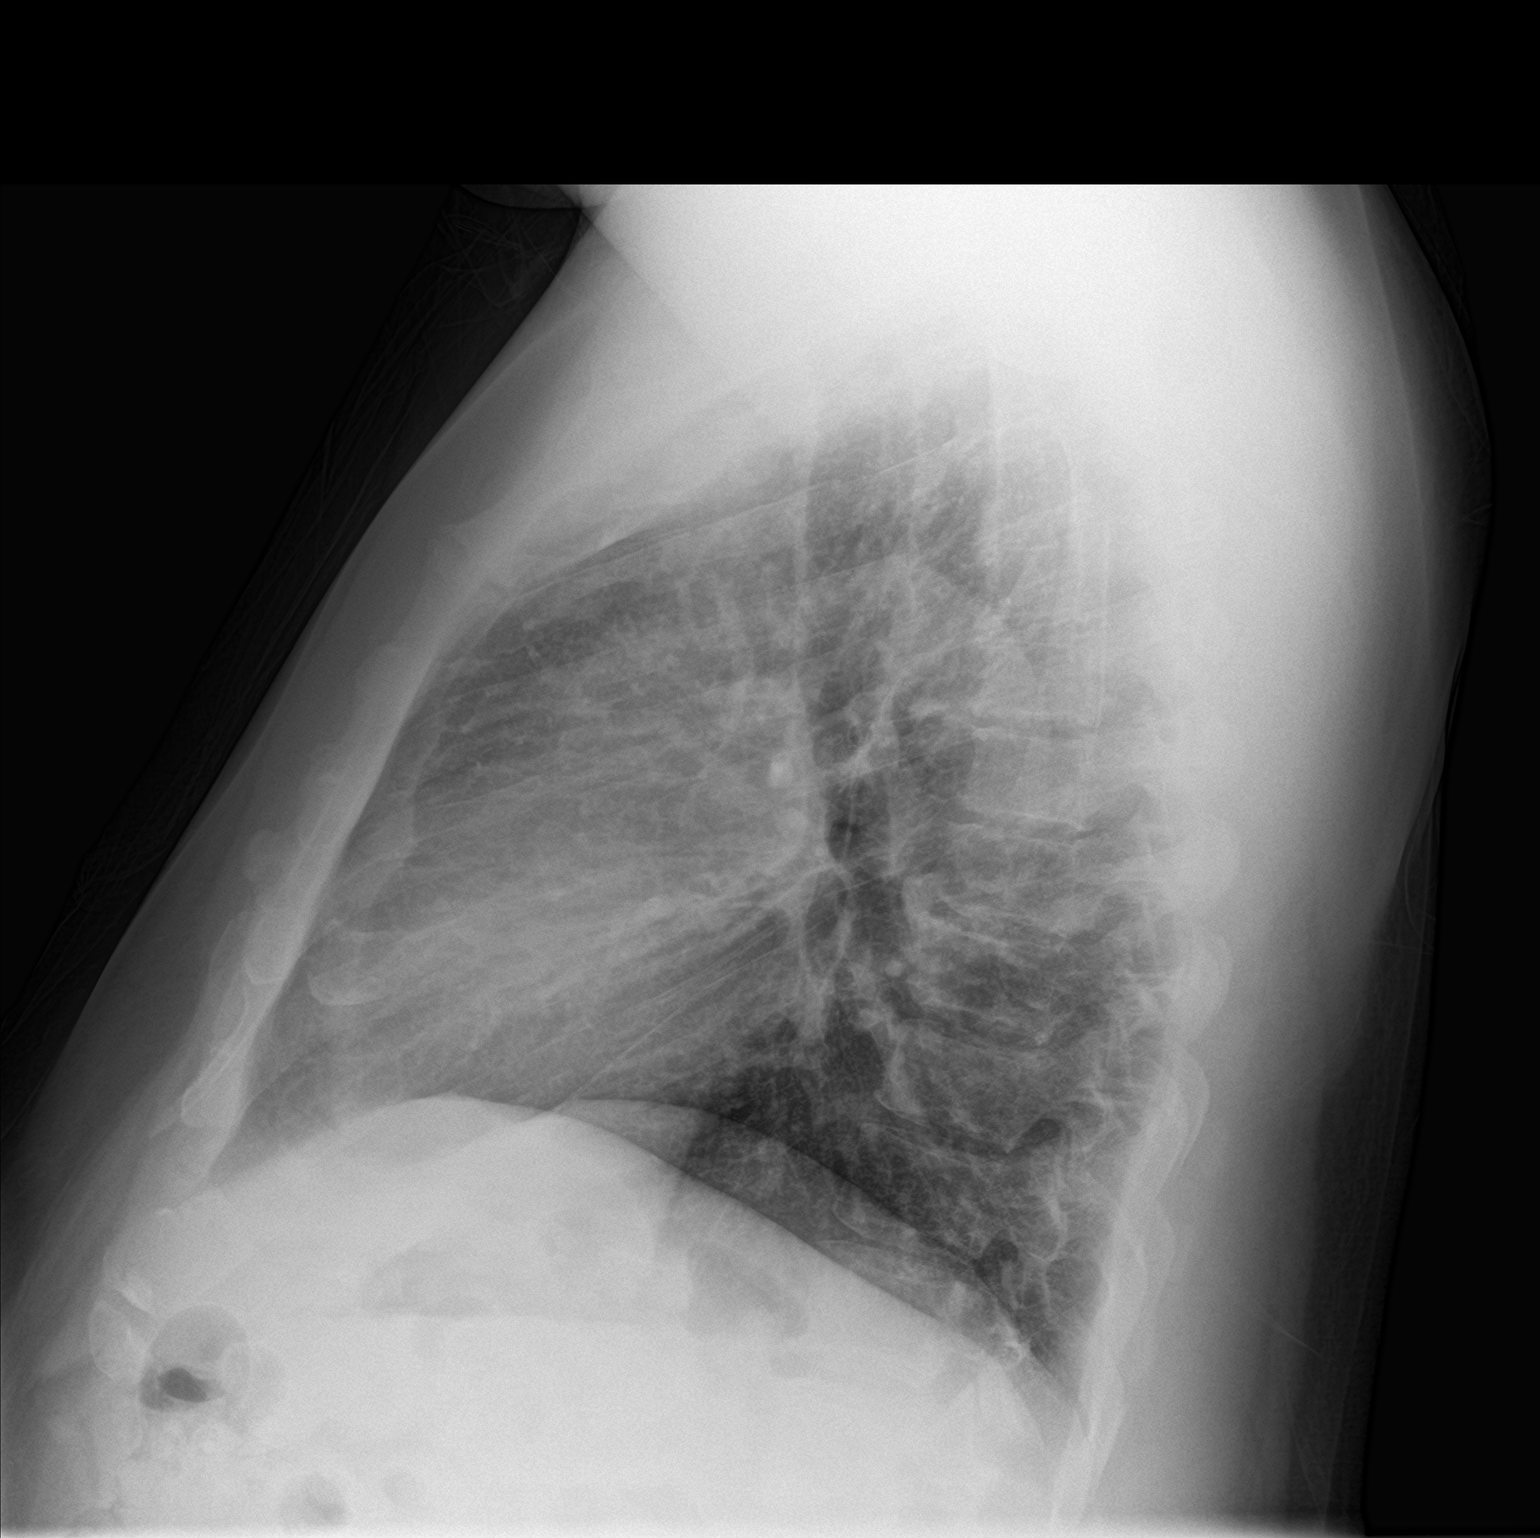

[2 of 2 positions shown; findings below may reference images not displayed]

FINDINGS: The heart size and mediastinal contours are within normal limits.
Both lungs are clear. The visualized skeletal structures are
unremarkable.
IMPRESSION: No active cardiopulmonary disease.

## 2019-01-28 ENCOUNTER — Ambulatory Visit: Payer: Self-pay | Admitting: Neurology

## 2019-01-29 ENCOUNTER — Other Ambulatory Visit: Payer: Self-pay

## 2019-01-31 ENCOUNTER — Telehealth: Payer: Self-pay

## 2019-01-31 DIAGNOSIS — I493 Ventricular premature depolarization: Secondary | ICD-10-CM

## 2019-01-31 DIAGNOSIS — I472 Ventricular tachycardia, unspecified: Secondary | ICD-10-CM

## 2019-01-31 NOTE — Telephone Encounter (Signed)
-----   Message from Quintella Reichert, MD sent at 01/30/2019  2:51 PM EDT ----- Please let patient know that his final report on his heart monitor showed normal sinus rhythm with one 5-second pause that occurred at nighttime.  He did have a 7 beat run of ventricular tachycardia.  Cardiac cath and echo in 2018 were normal.  Please order a cardiac MRI with gadolinium to assess LV function as well as rule out infiltrative diseases given his PVCs and V. tach.

## 2019-01-31 NOTE — Telephone Encounter (Signed)
Spoke with the patient, he expressed understanding about having a cardiac MRI.

## 2019-02-14 ENCOUNTER — Encounter: Payer: Self-pay | Admitting: Student

## 2019-03-10 ENCOUNTER — Other Ambulatory Visit: Payer: Self-pay

## 2019-03-10 ENCOUNTER — Ambulatory Visit (HOSPITAL_COMMUNITY)
Admission: RE | Admit: 2019-03-10 | Discharge: 2019-03-10 | Disposition: A | Discharge status: Home / Self Care | Payer: Medicaid Other | Source: Ambulatory Visit | Attending: Cardiology | Admitting: Cardiology

## 2019-03-10 DIAGNOSIS — I493 Ventricular premature depolarization: Secondary | ICD-10-CM

## 2019-03-10 DIAGNOSIS — I472 Ventricular tachycardia, unspecified: Secondary | ICD-10-CM

## 2019-03-10 LAB — CREATININE, SERUM
Creatinine, Ser: 1.25 mg/dL — ABNORMAL HIGH (ref 0.61–1.24)
GFR calc Af Amer: >60 mL/min (ref >60)
GFR calc non Af Amer: >60 mL/min (ref >60)

## 2019-03-10 MED ORDER — GADOBUTROL 1 MMOL/ML IV SOLN
12.0000 mL | Freq: Once | INTRAVENOUS | Status: AC | PRN
  Administered 2019-03-10: 12 mL via INTRAVENOUS

## 2019-03-27 ENCOUNTER — Other Ambulatory Visit (HOSPITAL_COMMUNITY): Payer: Medicaid Other

## 2019-03-31 ENCOUNTER — Telehealth: Payer: Self-pay | Admitting: Cardiology

## 2019-03-31 NOTE — Telephone Encounter (Signed)
New Message    Patient is calling because he is not understanding why he needs to have a echocardiogram. He states that his other test were good. Please call to discuss.

## 2019-03-31 NOTE — Telephone Encounter (Signed)
Spoke with the pt and explained to him what the echo will assess, and that this image is needed, for it has been over 2 years since his prior echo.  Pt education provided.  Pt verbalized understanding and agreed to proceed with his scheduled echo for 7/31 at 1035.  Pt was gracious for all the assistance provided.

## 2019-04-04 ENCOUNTER — Other Ambulatory Visit (HOSPITAL_COMMUNITY): Payer: Medicaid Other

## 2019-04-17 ENCOUNTER — Other Ambulatory Visit: Payer: Self-pay

## 2019-04-17 ENCOUNTER — Ambulatory Visit (HOSPITAL_COMMUNITY): Payer: Medicaid Other | Attending: Cardiovascular Disease

## 2019-04-17 DIAGNOSIS — R55 Syncope and collapse: Secondary | ICD-10-CM | POA: Insufficient documentation

## 2019-04-17 MED ORDER — PERFLUTREN LIPID MICROSPHERE
1.0000 mL | INTRAVENOUS | Status: AC | PRN
  Administered 2019-04-17: 2 mL via INTRAVENOUS

## 2019-04-18 ENCOUNTER — Telehealth: Payer: Self-pay

## 2019-04-18 NOTE — Telephone Encounter (Signed)
The patient has been notified of the echo result and verbalized understanding.  All questions (if any) were answered. Lilas Diefendorf R Qunicy Higinbotham, RN 04/18/2019 10:21 AM   

## 2019-04-18 NOTE — Progress Notes (Unsigned)
The patient has been notified of the echo result and verbalized understanding.  All questions (if any) were answered. Wilma Flavin, RN 04/18/2019 10:21 AM

## 2020-02-06 ENCOUNTER — Emergency Department (HOSPITAL_COMMUNITY): Payer: Medicaid Other

## 2020-02-06 ENCOUNTER — Emergency Department (HOSPITAL_COMMUNITY)
Admission: EM | Admit: 2020-02-06 | Discharge: 2020-02-07 | Disposition: A | Discharge status: Home / Self Care | Payer: Medicaid Other | Attending: Emergency Medicine | Admitting: Emergency Medicine

## 2020-02-06 ENCOUNTER — Other Ambulatory Visit: Payer: Self-pay

## 2020-02-06 ENCOUNTER — Encounter (HOSPITAL_COMMUNITY): Payer: Self-pay | Admitting: *Deleted

## 2020-02-06 DIAGNOSIS — Z6836 Body mass index (BMI) 36.0-36.9, adult: Secondary | ICD-10-CM | POA: Insufficient documentation

## 2020-02-06 DIAGNOSIS — F1490 Cocaine use, unspecified, uncomplicated: Secondary | ICD-10-CM | POA: Insufficient documentation

## 2020-02-06 DIAGNOSIS — F333 Major depressive disorder, recurrent, severe with psychotic symptoms: Secondary | ICD-10-CM | POA: Insufficient documentation

## 2020-02-06 DIAGNOSIS — Z79899 Other long term (current) drug therapy: Secondary | ICD-10-CM | POA: Insufficient documentation

## 2020-02-06 DIAGNOSIS — F17210 Nicotine dependence, cigarettes, uncomplicated: Secondary | ICD-10-CM | POA: Diagnosis not present

## 2020-02-06 DIAGNOSIS — Z9889 Other specified postprocedural states: Secondary | ICD-10-CM | POA: Insufficient documentation

## 2020-02-06 DIAGNOSIS — E119 Type 2 diabetes mellitus without complications: Secondary | ICD-10-CM | POA: Diagnosis not present

## 2020-02-06 DIAGNOSIS — F250 Schizoaffective disorder, bipolar type: Secondary | ICD-10-CM | POA: Diagnosis not present

## 2020-02-06 DIAGNOSIS — E669 Obesity, unspecified: Secondary | ICD-10-CM | POA: Diagnosis not present

## 2020-02-06 DIAGNOSIS — I10 Essential (primary) hypertension: Secondary | ICD-10-CM | POA: Diagnosis not present

## 2020-02-06 DIAGNOSIS — R531 Weakness: Secondary | ICD-10-CM | POA: Diagnosis not present

## 2020-02-06 DIAGNOSIS — R441 Visual hallucinations: Secondary | ICD-10-CM | POA: Diagnosis not present

## 2020-02-06 DIAGNOSIS — F1410 Cocaine abuse, uncomplicated: Secondary | ICD-10-CM

## 2020-02-06 DIAGNOSIS — Z794 Long term (current) use of insulin: Secondary | ICD-10-CM | POA: Insufficient documentation

## 2020-02-06 DIAGNOSIS — R4182 Altered mental status, unspecified: Secondary | ICD-10-CM | POA: Diagnosis present

## 2020-02-06 LAB — URINALYSIS, ROUTINE W REFLEX MICROSCOPIC
Bacteria, UA: NONE SEEN
Bilirubin Urine: NEGATIVE
Glucose, UA: >=500 mg/dL — AB
Ketones, ur: 20 mg/dL — AB
Leukocytes,Ua: NEGATIVE
Nitrite: NEGATIVE
Protein, ur: 100 mg/dL — AB
Specific Gravity, Urine: 1.031 — ABNORMAL HIGH (ref 1.005–1.030)
pH: 6 (ref 5.0–8.0)

## 2020-02-06 LAB — COMPREHENSIVE METABOLIC PANEL
ALT: 24 U/L (ref 0–44)
AST: 21 U/L (ref 15–41)
Albumin: 4.1 g/dL (ref 3.5–5.0)
Alkaline Phosphatase: 86 U/L (ref 38–126)
Anion gap: 13 (ref 5–15)
BUN: 14 mg/dL (ref 6–20)
CO2: 23 mmol/L (ref 22–32)
Calcium: 9.5 mg/dL (ref 8.9–10.3)
Chloride: 101 mmol/L (ref 98–111)
Creatinine, Ser: 1.35 mg/dL — ABNORMAL HIGH (ref 0.61–1.24)
GFR calc Af Amer: >60 mL/min (ref >60)
GFR calc non Af Amer: 57 mL/min — ABNORMAL LOW (ref >60)
Glucose, Bld: 299 mg/dL — ABNORMAL HIGH (ref 70–99)
Potassium: 5.3 mmol/L — ABNORMAL HIGH (ref 3.5–5.1)
Sodium: 137 mmol/L (ref 135–145)
Total Bilirubin: 0.8 mg/dL (ref 0.3–1.2)
Total Protein: 7.7 g/dL (ref 6.5–8.1)

## 2020-02-06 LAB — I-STAT CHEM 8, ED
BUN: 17 mg/dL (ref 6–20)
Calcium, Ion: 1.23 mmol/L (ref 1.15–1.40)
Chloride: 102 mmol/L (ref 98–111)
Creatinine, Ser: 1.2 mg/dL (ref 0.61–1.24)
Glucose, Bld: 293 mg/dL — ABNORMAL HIGH (ref 70–99)
HCT: 49 % (ref 39.0–52.0)
Hemoglobin: 16.7 g/dL (ref 13.0–17.0)
Potassium: 5.3 mmol/L — ABNORMAL HIGH (ref 3.5–5.1)
Sodium: 138 mmol/L (ref 135–145)
TCO2: 24 mmol/L (ref 22–32)

## 2020-02-06 LAB — CBC WITH DIFFERENTIAL/PLATELET
Abs Immature Granulocytes: 0.08 10*3/uL — ABNORMAL HIGH (ref 0.00–0.07)
Basophils Absolute: 0.1 10*3/uL (ref 0.0–0.1)
Basophils Relative: 0 %
Eosinophils Absolute: 0 10*3/uL (ref 0.0–0.5)
Eosinophils Relative: 0 %
HCT: 49.5 % (ref 39.0–52.0)
Hemoglobin: 16 g/dL (ref 13.0–17.0)
Immature Granulocytes: 1 %
Lymphocytes Relative: 9 %
Lymphs Abs: 1.2 10*3/uL (ref 0.7–4.0)
MCH: 26.9 pg (ref 26.0–34.0)
MCHC: 32.3 g/dL (ref 30.0–36.0)
MCV: 83.2 fL (ref 80.0–100.0)
Monocytes Absolute: 0.4 10*3/uL (ref 0.1–1.0)
Monocytes Relative: 3 %
Neutro Abs: 11.8 10*3/uL — ABNORMAL HIGH (ref 1.7–7.7)
Neutrophils Relative %: 87 %
Platelets: 292 10*3/uL (ref 150–400)
RBC: 5.95 MIL/uL — ABNORMAL HIGH (ref 4.22–5.81)
RDW: 13.9 % (ref 11.5–15.5)
WBC: 13.6 10*3/uL — ABNORMAL HIGH (ref 4.0–10.5)
nRBC: 0 % (ref 0.0–0.2)

## 2020-02-06 LAB — I-STAT VENOUS BLOOD GAS, ED
Acid-base deficit: 1 mmol/L (ref 0.0–2.0)
Bicarbonate: 28.1 mmol/L — ABNORMAL HIGH (ref 20.0–28.0)
Calcium, Ion: 1.2 mmol/L (ref 1.15–1.40)
HCT: 50 % (ref 39.0–52.0)
Hemoglobin: 17 g/dL (ref 13.0–17.0)
O2 Saturation: 57 %
Potassium: 5.3 mmol/L — ABNORMAL HIGH (ref 3.5–5.1)
Sodium: 138 mmol/L (ref 135–145)
TCO2: 30 mmol/L (ref 22–32)
pCO2, Ven: 63.6 mmHg — ABNORMAL HIGH (ref 44.0–60.0)
pH, Ven: 7.253 (ref 7.250–7.430)
pO2, Ven: 36 mmHg (ref 32.0–45.0)

## 2020-02-06 LAB — RAPID URINE DRUG SCREEN, HOSP PERFORMED
Amphetamines: NOT DETECTED
Barbiturates: NOT DETECTED
Benzodiazepines: NOT DETECTED
Cocaine: POSITIVE — AB
Opiates: NOT DETECTED
Tetrahydrocannabinol: NOT DETECTED

## 2020-02-06 LAB — POTASSIUM: Potassium: 4 mmol/L (ref 3.5–5.1)

## 2020-02-06 LAB — LIPASE, BLOOD: Lipase: 25 U/L (ref 11–51)

## 2020-02-06 LAB — CBG MONITORING, ED: Glucose-Capillary: 270 mg/dL — ABNORMAL HIGH (ref 70–99)

## 2020-02-06 MED ORDER — IOHEXOL 300 MG/ML  SOLN
100.0000 mL | Freq: Once | INTRAMUSCULAR | Status: AC | PRN
  Administered 2020-02-06: 100 mL via INTRAVENOUS

## 2020-02-06 MED ORDER — SODIUM CHLORIDE 0.9 % IV BOLUS
500.0000 mL | Freq: Once | INTRAVENOUS | Status: AC
  Administered 2020-02-06: 500 mL via INTRAVENOUS

## 2020-02-06 NOTE — ED Notes (Signed)
Getting tts 
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
## 2020-02-06 NOTE — Discharge Instructions (Addendum)
Please follow up with your PCP regarding your ED visit today Dr. Allyne Gee with ophthalmology has schedule another appointment for you on June 8th at 3 PM. His recommendations include: Remove patch Durezol 1 drop in R eye twice daily Besivance 1 drop in R eye 4 times daily  Do not rub the eye No tap water to R eye x 1 week
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
## 2020-02-06 NOTE — BH Assessment (Signed)
Attempting to complete TTS assessment. Patient in hallway. This clinician requesting patient to be moved to a room.  Patient's nurse will move to a private room so that TTS is able to complete his assessment.

## 2020-02-06 NOTE — ED Notes (Signed)
The pts son is at  The bedside
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
## 2020-02-06 NOTE — ED Notes (Signed)
Son Orey wants to be contacted with updates. Phone # [phone]
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
## 2020-07-16 ENCOUNTER — Emergency Department (HOSPITAL_COMMUNITY)
Admission: EM | Admit: 2020-07-16 | Discharge: 2020-07-17 | Disposition: A | Discharge status: Home / Self Care | Payer: Medicaid Other | Attending: Emergency Medicine | Admitting: Emergency Medicine

## 2020-07-16 DIAGNOSIS — R109 Unspecified abdominal pain: Secondary | ICD-10-CM | POA: Diagnosis present

## 2020-07-16 DIAGNOSIS — F1490 Cocaine use, unspecified, uncomplicated: Secondary | ICD-10-CM | POA: Insufficient documentation

## 2020-07-16 DIAGNOSIS — E119 Type 2 diabetes mellitus without complications: Secondary | ICD-10-CM | POA: Insufficient documentation

## 2020-07-16 DIAGNOSIS — Z79899 Other long term (current) drug therapy: Secondary | ICD-10-CM | POA: Insufficient documentation

## 2020-07-16 DIAGNOSIS — Z7984 Long term (current) use of oral hypoglycemic drugs: Secondary | ICD-10-CM | POA: Insufficient documentation

## 2020-07-16 DIAGNOSIS — F17210 Nicotine dependence, cigarettes, uncomplicated: Secondary | ICD-10-CM | POA: Insufficient documentation

## 2020-07-16 DIAGNOSIS — I10 Essential (primary) hypertension: Secondary | ICD-10-CM | POA: Diagnosis not present

## 2020-07-16 DIAGNOSIS — Z794 Long term (current) use of insulin: Secondary | ICD-10-CM | POA: Insufficient documentation

## 2020-07-16 DIAGNOSIS — I158 Other secondary hypertension: Secondary | ICD-10-CM

## 2020-07-16 DIAGNOSIS — R079 Chest pain, unspecified: Secondary | ICD-10-CM | POA: Diagnosis not present

## 2020-07-16 DIAGNOSIS — J449 Chronic obstructive pulmonary disease, unspecified: Secondary | ICD-10-CM | POA: Insufficient documentation

## 2020-07-17 ENCOUNTER — Other Ambulatory Visit: Payer: Self-pay

## 2020-07-17 ENCOUNTER — Emergency Department (HOSPITAL_COMMUNITY): Payer: Medicaid Other

## 2020-07-17 LAB — COMPREHENSIVE METABOLIC PANEL WITH GFR
ALT: 18 U/L (ref 0–44)
AST: 15 U/L (ref 15–41)
Albumin: 3.5 g/dL (ref 3.5–5.0)
Alkaline Phosphatase: 73 U/L (ref 38–126)
Anion gap: 11 (ref 5–15)
BUN: 13 mg/dL (ref 6–20)
CO2: 21 mmol/L — ABNORMAL LOW (ref 22–32)
Calcium: 9.1 mg/dL (ref 8.9–10.3)
Chloride: 106 mmol/L (ref 98–111)
Creatinine, Ser: 1.27 mg/dL — ABNORMAL HIGH (ref 0.61–1.24)
GFR, Estimated: >60 mL/min
Glucose, Bld: 183 mg/dL — ABNORMAL HIGH (ref 70–99)
Potassium: 3.8 mmol/L (ref 3.5–5.1)
Sodium: 138 mmol/L (ref 135–145)
Total Bilirubin: 0.6 mg/dL (ref 0.3–1.2)
Total Protein: 6.6 g/dL (ref 6.5–8.1)

## 2020-07-17 LAB — CBC
HCT: 41.7 % (ref 39.0–52.0)
Hemoglobin: 13.8 g/dL (ref 13.0–17.0)
MCH: 27.2 pg (ref 26.0–34.0)
MCHC: 33.1 g/dL (ref 30.0–36.0)
MCV: 82.2 fL (ref 80.0–100.0)
Platelets: 267 10*3/uL (ref 150–400)
RBC: 5.07 MIL/uL (ref 4.22–5.81)
RDW: 13.9 % (ref 11.5–15.5)
WBC: 10.5 10*3/uL (ref 4.0–10.5)
nRBC: 0 % (ref 0.0–0.2)

## 2020-07-17 LAB — CBG MONITORING, ED: Glucose-Capillary: 181 mg/dL — ABNORMAL HIGH (ref 70–99)

## 2020-07-17 LAB — TROPONIN I (HIGH SENSITIVITY)
Troponin I (High Sensitivity): 22 ng/L — ABNORMAL HIGH (ref <18)
Troponin I (High Sensitivity): 21 ng/L — ABNORMAL HIGH

## 2020-07-17 NOTE — ED Triage Notes (Signed)
Pt arrives to ED BIB [facility] due to CP & HTN. Per EMS pt started having CP after snorting $20 worth of cocaine. Per EMS initial BP was 260/140. 1 nitro sublingual and 324 mg aspirin was administered by EMS with change in BP of 230/150. Hx of HTN and Diabetes. Pt is A/O x4.  BP 230/150 HR 90

## 2020-07-17 NOTE — ED Provider Notes (Signed)
[facility] EMERGENCY DEPARTMENT Provider Note  CSN: [id number] Arrival date & time: 07/16/20 2348  Chief Complaint(s) Hypertension  HPI Charles Orr is a 59 y.o. male with a past medical history listed below including cocaine abuse who presents to the emergency department for elevated blood pressures and discomfort in his abdomen going into his chest shortly after using cocaine.  Patient denied any overt primary chest pain.  No shortness of breath.  No nausea or vomiting.  Currently discomfort has completely resolved.  Denies any acute physical complaints at this time.  Patient was brought in by EMS who noted systolic blood pressures of 230s to 260s.  HPI  Past Medical History Past Medical History:  Diagnosis Date  . Angina pectoris (HCC) 07/09/2017  . Anxiety   . Arthritis    "back" (06/12/2017)  . Bipolar disorder, curr episode mixed, severe, with psychotic features (HCC) 10/08/2017  . CAP (community acquired pneumonia) 06/10/2017  . Chest pain 07/10/2017  . Chronic lower back pain   . 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 Community acquired pneumonia of right lower lobe of lung   . Degenerative disc disease, lumbar   . Depression   . DM (diabetes mellitus) (HCC)   . Fibromyalgia   . GERD (gastroesophageal reflux disease)   . High cholesterol   . Hyperglycemia   . Hypertension   . Localized edema   . Myocardial infarction (HCC) 2002  . Osteoarthritis   . Other schizophrenia (HCC)   . Pneumonia 1989  . Polysubstance (including opioids) dependence with physiological dependence (HCC) 10/06/2017  . Schizoaffective disorder, bipolar type (HCC) 06/16/2017  . Schizophrenia (HCC)   . Shortness of breath 06/10/2017  . Tobacco abuse 07/09/2017  . Type II diabetes mellitus [facility])    Patient Active Problem List   Diagnosis Date Noted  . Dizziness 11/11/2018  . Bipolar disorder, curr episode mixed, severe, with  psychotic features (HCC) 10/08/2017  . Polysubstance (including opioids) dependence with physiological dependence (HCC) 10/06/2017  . 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 Chest pain 07/10/2017  . Angina pectoris (HCC) 07/09/2017  . Tobacco abuse 07/09/2017  . Hypertension 07/09/2017  . Type II diabetes mellitus (HCC)   . GERD (gastroesophageal reflux disease)   . Chronic lower back pain   . Poorly controlled diabetes mellitus (HCC)   . Hyperlipidemia with target LDL less than 70   . Schizoaffective disorder, bipolar type (HCC) 06/16/2017  . Depression 06/15/2017  . Pneumonia 06/11/2017  . Community acquired pneumonia of right lower lobe of lung   . Hyperglycemia   . Localized edema   . Shortness of breath 06/10/2017   Home Medication(s) Prior to Admission medications   Medication Sig Start Date End Date Taking? Authorizing Provider  albuterol (PROVENTIL HFA;VENTOLIN HFA) 108 (90 Base) MCG/ACT inhaler Inhale 2 puffs into the lungs every 6 (six) hours as needed for wheezing or shortness of breath.    [provider]  atorvastatin (LIPITOR) 80 MG tablet Take 80 mg by mouth at bedtime. 06/07/18   [provider]  BESIVANCE 0.6 % SUSP Place 1 drop into the right eye 3 (three) times daily. 01/20/20   [provider]  cloNIDine (CATAPRES) 0.1 MG tablet Take 0.1 mg by mouth daily. 02/04/20   [provider]  dorzolamide-timolol (COSOPT) 22.3-6.8 MG/ML ophthalmic solution Place 1 drop into the right eye 2 (two) times daily. 01/27/20   [provider]  DUREZOL 0.05 % EMUL Place 1 drop into the right eye 3 (three) times daily. 01/20/20   [provider]  Exenatide ER (BYDUREON) 2 MG PEN Inject 2 mg into the skin once a week. 12/09/19   [provider]  gabapentin (NEURONTIN) 300 MG capsule Take 300 mg by mouth 3 (three) times daily. 02/04/20   [provider]    HYDROcodone-acetaminophen (NORCO) 10-325 MG tablet Take 1 tablet by mouth every 6 (six) hours as needed for severe pain. 01/15/20   [provider]  hydrOXYzine (ATARAX/VISTARIL) 50 MG tablet Take 1 tablet (50 mg total) by mouth every 6 (six) hours as needed for anxiety. 10/16/17   Armandina Stammer I, NP  LANTUS SOLOSTAR 100 UNIT/ML Solostar Pen Inject 2-38 Units into the skin 2 (two) times daily. 22 units in the morning, and 38 units at night. 06/07/18   [provider]  losartan (COZAAR) 25 MG tablet Take 1 tablet (25 mg total) by mouth daily. 12/31/18 02/06/20  Quintella Reichert, MD  meclizine (ANTIVERT) 25 MG tablet Take 1 tablet (25 mg total) by mouth 3 (three) times daily as needed for dizziness. Patient not taking: Reported on 02/06/2020 08/27/18   Lorre Nick, MD  METFORMIN HCL ER PO Take 1,000 mg by mouth 2 (two) times daily.    [provider]  naproxen sodium (ALEVE) 220 MG tablet Take 220 mg by mouth daily as needed (Pain).    [provider]  NOVOLOG FLEXPEN 100 UNIT/ML FlexPen Inject 20 Units into the skin 3 (three) times daily with meals.  11/01/18   [provider]  pregabalin (LYRICA) 100 MG capsule Take 100 mg by mouth daily. 06/07/18   [provider]  ranitidine (ZANTAC) 150 MG tablet Take 150 mg by mouth daily. 08/11/17   [provider]  risperiDONE (RISPERDAL) 1 MG tablet Take 1 tablet (1 mg) by mouth in the mornings & 2 tablets (2 mg) at bedtime: For mood control Patient not taking: Reported on 02/06/2020 10/18/17   Sanjuana Kava, NP                                                                                                                                    Past Surgical History Past Surgical History:  Procedure Laterality Date  . CARDIAC CATHETERIZATION  12/2006   [name] 01/18/2011  . INGUINAL HERNIA REPAIR Right   . LEFT HEART CATH AND CORONARY ANGIOGRAPHY N/A 07/11/2017   Procedure: LEFT HEART CATH AND CORONARY  ANGIOGRAPHY;  Surgeon: Tonny Bollman, MD;  Location: [facility] INVASIVE CV LAB;  Service: Cardiovascular;  Laterality: N/A;  . PARATHYROIDECTOMY    . PERICARDIAL TAP  2000   ?; in [place]/notes 01/18/2011   Family History Family History  Problem Relation Age of Onset  . Hypertension Mother   . Diabetes Mother   . Heart disease Father   . Hypertension Sister   . Diabetes  Sister   . Hypertension Brother   . Diabetes Brother   . Hypertension Sister   . Diabetes Sister   . Hypertension Brother   . Diabetes Brother     Social History Social History   Tobacco Use  . Smoking status: Current Every Day Smoker    Packs/day: 0.50    Years: 38.00    Pack years: 19.00    Types: Cigarettes  . Smokeless tobacco: Current User    Types: Chew  Vaping Use  . Vaping Use: Never used  Substance Use Topics  . Alcohol use: Yes    Comment: 06/12/2017 "stopped ~ 1 yr ago"   . Drug use: Yes    Types: Cocaine    Comment: 06/12/2017 "stopped using cocaine ~ 1 yr ago; used marijuana when I was younger" last cocaine use 9 mths ago per pt on 06/15/17   Allergies Patient has no known allergies.  Review of Systems Review of Systems All other systems are reviewed and are negative for acute change except as noted in the HPI  Physical Exam Vital Signs  I have reviewed the triage vital signs BP (!) 178/79   Pulse 79   Temp 98.4 F (36.9 C) (Oral)   Resp 18   Ht 6\' 2"  (1.88 m)   Wt 127.5 kg   SpO2 97%   BMI 36.08 kg/m   Physical Exam Vitals reviewed.  Constitutional:      General: He is not in acute distress.    Appearance: He is well-developed. He is not diaphoretic.  HENT:     Head: Normocephalic and atraumatic.     Nose: Nose normal.  Eyes:     General: No scleral icterus.       Right eye: No discharge.        Left eye: No discharge.     Conjunctiva/sclera: Conjunctivae normal.     Pupils: Pupils are equal, round, and reactive to light.  Cardiovascular:     Rate and Rhythm: Normal  rate and regular rhythm.     Heart sounds: No murmur heard.  No friction rub. No gallop.   Pulmonary:     Effort: Pulmonary effort is normal. No respiratory distress.     Breath sounds: Normal breath sounds. No stridor. No rales.  Abdominal:     General: There is no distension.     Palpations: Abdomen is soft.     Tenderness: There is no abdominal tenderness.  Musculoskeletal:        General: No tenderness.     Cervical back: Normal range of motion and neck supple.  Skin:    General: Skin is warm and dry.     Findings: No erythema or rash.  Neurological:     Mental Status: He is alert and oriented to person, place, and time.     ED Results and Treatments Labs (all labs ordered are listed, but only abnormal results are displayed) Labs Reviewed  COMPREHENSIVE METABOLIC PANEL - Abnormal; Notable for the following components:      Result Value   CO2 21 (*)    Glucose, Bld 183 (*)    Creatinine, Ser 1.27 (*)    All other components within normal limits  CBG MONITORING, ED - Abnormal; Notable for the following components:   Glucose-Capillary 181 (*)    All other components within normal limits  TROPONIN I (HIGH SENSITIVITY) - Abnormal; Notable for the following components:   Troponin I (High Sensitivity) 22 (*)  All other components within normal limits  TROPONIN I (HIGH SENSITIVITY) - Abnormal; Notable for the following components:   Troponin I (High Sensitivity) 21 (*)    All other components within normal limits  CBC                                                                                                                         EKG  EKG Interpretation  Date/Time:  Friday July 16 2020 23:49:22 EST Ventricular Rate:  83 PR Interval:    QRS Duration: 84 QT Interval:  392 QTC Calculation: 455 R Axis:   47 Text Interpretation: Sinus rhythm Atrial premature complex Borderline ST elevation, anterior leads No acute changes Confirmed by Drema Pry [phone]) on  07/17/2020 1:08:53 AM      Radiology DG Chest 2 View  Result Date: 07/17/2020 CLINICAL DATA:  Chest pain EXAM: CHEST - 2 VIEW COMPARISON:  07/20/2018 FINDINGS: The heart size and mediastinal contours are within normal limits. Both lungs are clear. The visualized skeletal structures are unremarkable. IMPRESSION: No active cardiopulmonary disease. Electronically Signed   By: Helyn Numbers MD   On: 07/17/2020 00:54    Pertinent labs & imaging results that were available during my care of the patient were reviewed by me and considered in my medical decision making (see chart for details).  Medications Ordered in ED Medications - No data to display                                                                                                                                  Procedures Procedures  (including critical care time)  Medical Decision Making / ED Course I have reviewed the nursing notes for this encounter and the patient's prior records (if available in EHR or on provided paperwork).   Charles Orr was evaluated in Emergency Department on 07/17/2020 for the symptoms described in the history of present illness. He was evaluated in the context of the global COVID-19 pandemic, which necessitated consideration that the patient might be at risk for infection with the SARS-CoV-2 virus that causes COVID-19. Institutional protocols and algorithms that pertain to the evaluation of patients at risk for COVID-19 are in a state of rapid change based on information released by regulatory bodies including the CDC and federal and state organizations. These policies and algorithms were followed during the patient's care in the ED.  Patient presents with  hypertension in the setting of cocaine use.  No overt primary chest pain.  EKG without acute ischemic changes or evidence of pericarditis.  Patient is currently asymptomatic.  Blood pressure has improved without intervention.  Serial troponins were  drawn and flat.  Chest x-ray without evidence suggestive of pneumonia, pneumothorax, pneumomediastinum.  No abnormal contour of the mediastinum to suggest dissection. No evidence of acute injuries.  Rest of the work-up was grossly reassuring.  Patient remained hemodynamically stable and remained asymptomatic during his stay.      Final Clinical Impression(s) / ED Diagnoses Final diagnoses:  Chest pain  Cocaine use  Other secondary hypertension    The patient appears reasonably screened and/or stabilized for discharge and I doubt any other medical condition or other [facility] requiring further screening, evaluation, or treatment in the ED at this time prior to discharge. Safe for discharge with strict return precautions.  Disposition: Discharge  Condition: Good  I have discussed the results, Dx and Tx plan with the patient/family who expressed understanding and agree(s) with the plan. Discharge instructions discussed at length. The patient/family was given strict return precautions who verbalized understanding of the instructions. No further questions at time of discharge.    ED Discharge Orders    None        This chart was dictated using voice recognition software.  Despite best efforts to proofread,  errors can occur which can change the documentation meaning.   Nira Conn, MD 07/17/20 [phone]

## 2021-02-13 ENCOUNTER — Emergency Department (HOSPITAL_COMMUNITY)
Admission: EM | Admit: 2021-02-13 | Discharge: 2021-02-14 | Disposition: A | Discharge status: Home / Self Care | Payer: Medicaid Other | Source: Home / Self Care | Attending: Emergency Medicine | Admitting: Emergency Medicine

## 2021-02-13 ENCOUNTER — Other Ambulatory Visit: Payer: Self-pay

## 2021-02-13 ENCOUNTER — Emergency Department (HOSPITAL_COMMUNITY): Payer: Medicaid Other

## 2021-02-13 ENCOUNTER — Encounter (HOSPITAL_COMMUNITY): Payer: Self-pay

## 2021-02-13 DIAGNOSIS — F1494 Cocaine use, unspecified with cocaine-induced mood disorder: Secondary | ICD-10-CM

## 2021-02-13 DIAGNOSIS — R45851 Suicidal ideations: Secondary | ICD-10-CM

## 2021-02-13 DIAGNOSIS — F250 Schizoaffective disorder, bipolar type: Secondary | ICD-10-CM

## 2021-02-13 DIAGNOSIS — R202 Paresthesia of skin: Secondary | ICD-10-CM | POA: Insufficient documentation

## 2021-02-13 DIAGNOSIS — F201 Disorganized schizophrenia: Secondary | ICD-10-CM | POA: Insufficient documentation

## 2021-02-13 DIAGNOSIS — F17210 Nicotine dependence, cigarettes, uncomplicated: Secondary | ICD-10-CM | POA: Insufficient documentation

## 2021-02-13 DIAGNOSIS — I10 Essential (primary) hypertension: Secondary | ICD-10-CM | POA: Insufficient documentation

## 2021-02-13 DIAGNOSIS — F17220 Nicotine dependence, chewing tobacco, uncomplicated: Secondary | ICD-10-CM | POA: Insufficient documentation

## 2021-02-13 DIAGNOSIS — E119 Type 2 diabetes mellitus without complications: Secondary | ICD-10-CM | POA: Insufficient documentation

## 2021-02-13 DIAGNOSIS — Z7984 Long term (current) use of oral hypoglycemic drugs: Secondary | ICD-10-CM | POA: Insufficient documentation

## 2021-02-13 DIAGNOSIS — R45850 Homicidal ideations: Secondary | ICD-10-CM | POA: Insufficient documentation

## 2021-02-13 DIAGNOSIS — Z794 Long term (current) use of insulin: Secondary | ICD-10-CM | POA: Insufficient documentation

## 2021-02-13 DIAGNOSIS — Z79899 Other long term (current) drug therapy: Secondary | ICD-10-CM | POA: Insufficient documentation

## 2021-02-13 LAB — COMPREHENSIVE METABOLIC PANEL
ALT: 16 U/L (ref 0–44)
AST: 15 U/L (ref 15–41)
Albumin: 3.7 g/dL (ref 3.5–5.0)
Alkaline Phosphatase: 65 U/L (ref 38–126)
Anion gap: 12 (ref 5–15)
BUN: 9 mg/dL (ref 6–20)
CO2: 22 mmol/L (ref 22–32)
Calcium: 9.2 mg/dL (ref 8.9–10.3)
Chloride: 105 mmol/L (ref 98–111)
Creatinine, Ser: 1.16 mg/dL (ref 0.61–1.24)
GFR, Estimated: >60 mL/min (ref >60)
Glucose, Bld: 209 mg/dL — ABNORMAL HIGH (ref 70–99)
Potassium: 3.7 mmol/L (ref 3.5–5.1)
Sodium: 139 mmol/L (ref 135–145)
Total Bilirubin: 0.9 mg/dL (ref 0.3–1.2)
Total Protein: 6.7 g/dL (ref 6.5–8.1)

## 2021-02-13 LAB — RAPID URINE DRUG SCREEN, HOSP PERFORMED
Amphetamines: NOT DETECTED
Barbiturates: NOT DETECTED
Benzodiazepines: NOT DETECTED
Cocaine: POSITIVE — AB
Opiates: NOT DETECTED
Tetrahydrocannabinol: NOT DETECTED

## 2021-02-13 LAB — CBG MONITORING, ED
Glucose-Capillary: 337 mg/dL — ABNORMAL HIGH (ref 70–99)
Glucose-Capillary: 237 mg/dL — ABNORMAL HIGH (ref 70–99)

## 2021-02-13 LAB — CBC
HCT: 45.2 % (ref 39.0–52.0)
Hemoglobin: 14.9 g/dL (ref 13.0–17.0)
MCH: 26.7 pg (ref 26.0–34.0)
MCHC: 33 g/dL (ref 30.0–36.0)
MCV: 81 fL (ref 80.0–100.0)
Platelets: 284 10*3/uL (ref 150–400)
RBC: 5.58 MIL/uL (ref 4.22–5.81)
RDW: 14 % (ref 11.5–15.5)
WBC: 10.6 10*3/uL — ABNORMAL HIGH (ref 4.0–10.5)
nRBC: 0 % (ref 0.0–0.2)

## 2021-02-13 LAB — ACETAMINOPHEN LEVEL: Acetaminophen (Tylenol), Serum: <10 ug/mL — ABNORMAL LOW (ref 10–30)

## 2021-02-13 LAB — ETHANOL: Alcohol, Ethyl (B): <10 mg/dL (ref <10)

## 2021-02-13 LAB — SALICYLATE LEVEL: Salicylate Lvl: <7 mg/dL — ABNORMAL LOW (ref 7.0–30.0)

## 2021-02-13 MED ORDER — LOSARTAN POTASSIUM 50 MG PO TABS: 25.0000 mg | ORAL_TABLET | Freq: Every day | ORAL | Status: DC

## 2021-02-13 MED ORDER — ATORVASTATIN CALCIUM 80 MG PO TABS
80.0000 mg | ORAL_TABLET | Freq: Every day | ORAL | Status: DC
  Administered 2021-02-13 (×2): 80 mg via ORAL
  Filled 2021-02-13 (×2): qty 1

## 2021-02-13 MED ORDER — CLONIDINE HCL 0.2 MG PO TABS
0.1000 mg | ORAL_TABLET | Freq: Two times a day (BID) | ORAL | Status: DC
  Administered 2021-02-13 – 2021-02-14 (×3): 0.1 mg via ORAL
  Filled 2021-02-13 (×3): qty 1

## 2021-02-13 MED ORDER — METFORMIN HCL 500 MG PO TABS
1000.0000 mg | ORAL_TABLET | Freq: Two times a day (BID) | ORAL | Status: DC
  Administered 2021-02-13 – 2021-02-14 (×2): 1000 mg via ORAL
  Filled 2021-02-13 (×2): qty 2

## 2021-02-13 MED ORDER — RISPERIDONE 1 MG PO TABS
1.0000 mg | ORAL_TABLET | Freq: Two times a day (BID) | ORAL | Status: DC
  Administered 2021-02-13 – 2021-02-14 (×3): 1 mg via ORAL
  Filled 2021-02-13 (×3): qty 1

## 2021-02-13 MED ORDER — INSULIN GLARGINE 100 UNIT/ML SOLOSTAR PEN: 2.0000 [IU] | PEN_INJECTOR | Freq: Two times a day (BID) | SUBCUTANEOUS | Status: DC

## 2021-02-13 MED ORDER — INSULIN GLARGINE 100 UNIT/ML ~~LOC~~ SOLN
22.0000 [IU] | Freq: Every morning | SUBCUTANEOUS | Status: DC
  Administered 2021-02-14: 22 [IU] via SUBCUTANEOUS
  Filled 2021-02-13 (×2): qty 0.22

## 2021-02-13 MED ORDER — GABAPENTIN 600 MG PO TABS
300.0000 mg | ORAL_TABLET | Freq: Three times a day (TID) | ORAL | Status: DC
  Administered 2021-02-13 – 2021-02-14 (×3): 300 mg via ORAL
  Filled 2021-02-13 (×3): qty 1

## 2021-02-13 MED ORDER — METFORMIN HCL 500 MG PO TABS: 1000.0000 mg | ORAL_TABLET | Freq: Two times a day (BID) | ORAL | Status: DC

## 2021-02-13 MED ORDER — BUSPIRONE HCL 10 MG PO TABS
10.0000 mg | ORAL_TABLET | Freq: Two times a day (BID) | ORAL | Status: DC
  Administered 2021-02-13 – 2021-02-14 (×3): 10 mg via ORAL
  Filled 2021-02-13 (×3): qty 1

## 2021-02-13 MED ORDER — LOSARTAN POTASSIUM 50 MG PO TABS
25.0000 mg | ORAL_TABLET | Freq: Every day | ORAL | Status: DC
  Administered 2021-02-13 – 2021-02-14 (×2): 25 mg via ORAL
  Filled 2021-02-13 (×2): qty 1

## 2021-02-13 MED ORDER — TRAZODONE HCL 100 MG PO TABS
100.0000 mg | ORAL_TABLET | Freq: Every day | ORAL | Status: DC
  Administered 2021-02-13: 100 mg via ORAL
  Filled 2021-02-13: qty 1

## 2021-02-13 MED ORDER — INSULIN ASPART 100 UNIT/ML ~~LOC~~ SOLN
20.0000 [IU] | Freq: Three times a day (TID) | SUBCUTANEOUS | Status: DC
  Administered 2021-02-13 – 2021-02-14 (×3): 20 [IU] via SUBCUTANEOUS

## 2021-02-13 MED ORDER — INSULIN GLARGINE 100 UNIT/ML ~~LOC~~ SOLN
38.0000 [IU] | Freq: Every day | SUBCUTANEOUS | Status: DC
  Administered 2021-02-13: 38 [IU] via SUBCUTANEOUS
  Filled 2021-02-13 (×2): qty 0.38

## 2021-02-13 NOTE — ED Notes (Signed)
Breakfast tray ordered 
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
## 2021-02-13 NOTE — ED Notes (Signed)
Pt talking to TTS at this time.  ?

## 2021-02-13 NOTE — ED Notes (Signed)
Patient belongings placed in locker # 3, patient wanded by security, valuables placed in safe by security.

## 2021-02-13 NOTE — ED Notes (Addendum)
Pt dressed out into burgundy scrubs. Bag includes gray shirt, black pants, tennis shoes, white pair of socks, and a pair of reading glasses.   Pt walking cane also stored in locker #3.

## 2021-02-13 NOTE — ED Notes (Signed)
Taken to CT at this time. 

## 2021-02-13 NOTE — Progress Notes (Signed)
Per Vivien Presto, patient meets criteria for inpatient treatment. There are no available or appropriate beds at [facility] today. CSW faxed referrals to the following facilities for review:  [facility]   TTS will continue to seek bed placement.  Crissie Reese, MSW, LCSW-A, LCAS-A Phone: [phone] Disposition/TOC

## 2021-02-13 NOTE — ED Triage Notes (Signed)
Pt BIB [facility] from home, SI and hearing voices x2 days. Gx of same. Also endorses confusion. Oriented to self only. Pt denies any pain. [employer] called EMS for transport d/t confusion.   20g RAC received 400 cc NS   BP 171/87 HR 90 RR 16 96% RA  CBG 210

## 2021-02-13 NOTE — ED Provider Notes (Signed)
[facility] EMERGENCY DEPARTMENT Provider Note   CSN: [id number] Arrival date & time: 02/13/21  4128     History Chief Complaint  Patient presents with   Suicidal   Hallucinations   Level 5 caveat due to confusion and possible psychiatric condition  Charles Orr is a 60 y.o. male presents to the ED for evaluation of reported suicidal ideation, hallucinations, homicidal ideation.  Patient is awake and alert when I enter the room.  States "there is a lot of people in here".  I am the only person in the emergency department room with him at this time.  He is able to tell me his first name.  States he is in [place] but does not know exactly where he is.  He does not know why he is here or how he got here.  Has been more depressed and anxious.  States "the voice" told him that he had a knife in his pocket.  "The voice" was telling him how to hurt himself and how to hurt other people.  States he is seeing a lot of people in the room but does not know who they are.  Denies attempt to harm himself or others.  Denies tactile hallucinations.  States he takes a lot of medication drugs but denies recreational or illicit drug use.  Denies alcohol use.  Denies any physical pain.  Specifically denies headache, chest pain, shortness of breath.  He is asking for food.  No interventions.  No modifying factors.  His symptoms began 3 days ago. Reports bilateral feet tingling.   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   Community acquired pneumonia of right lower lobe of lung    Degenerative disc disease, lumbar    Depression     DM (diabetes mellitus) (HCC)    Fibromyalgia    GERD (gastroesophageal reflux disease)    High cholesterol    Hyperglycemia    Hypertension    Localized edema    Myocardial infarction (HCC) 2002   Osteoarthritis    Other schizophrenia (HCC)    Pneumonia 1989   Polysubstance (including opioids) dependence with physiological dependence (HCC) 10/06/2017   Schizoaffective disorder, bipolar type (HCC) 06/16/2017   Schizophrenia (HCC)    Shortness of breath 06/10/2017   Tobacco abuse 07/09/2017   Type II diabetes mellitus (HCC)     Patient Active Problem List   Diagnosis Date Noted   Dizziness 11/11/2018   Bipolar disorder, curr episode mixed, severe, with psychotic features (HCC) 10/08/2017   Polysubstance (including opioids) dependence with physiological dependence (HCC) 10/06/2017   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   Chest pain 07/10/2017   Angina pectoris (HCC) 07/09/2017   Tobacco abuse 07/09/2017   Hypertension 07/09/2017   Type II diabetes mellitus (HCC)    GERD (gastroesophageal reflux disease)    Chronic lower back pain    Poorly controlled diabetes mellitus (HCC)    Hyperlipidemia with target LDL less than 70    Schizoaffective disorder, bipolar type (HCC) 06/16/2017   Depression 06/15/2017   Pneumonia 06/11/2017   Community  acquired pneumonia of right lower lobe of lung    Hyperglycemia    Localized edema    Shortness of breath 06/10/2017    Past Surgical History:  Procedure Laterality Date   CARDIAC CATHETERIZATION  12/2006   [name] 01/18/2011   INGUINAL HERNIA REPAIR Right    LEFT HEART CATH AND CORONARY ANGIOGRAPHY N/A 07/11/2017   Procedure: LEFT HEART CATH AND CORONARY ANGIOGRAPHY;  Surgeon: Tonny Bollman, MD;  Location: [facility] INVASIVE CV LAB;  Service: Cardiovascular;  Laterality: N/A;   PARATHYROIDECTOMY     PERICARDIAL TAP  2000   ?; in [place]/notes 01/18/2011       Family History  Problem  Relation Age of Onset   Hypertension Mother    Diabetes Mother    Heart disease Father    Hypertension Sister    Diabetes Sister    Hypertension Brother    Diabetes Brother    Hypertension Sister    Diabetes Sister    Hypertension Brother    Diabetes Brother     Social History   Tobacco Use   Smoking status: Every Day    Packs/day: 0.50    Years: 38.00    Pack years: 19.00    Types: Cigarettes   Smokeless tobacco: Current    Types: Chew  Vaping Use   Vaping Use: Never used  Substance Use Topics   Alcohol use: Yes    Comment: 06/12/2017 "stopped ~ 1 yr ago"    Drug use: Yes    Types: Cocaine    Comment: 06/12/2017 "stopped using cocaine ~ 1 yr ago; used marijuana when I was younger" last cocaine use 9 mths ago per pt on 06/15/17    Home Medications Prior to Admission medications   Medication Sig Start Date End Date Taking? Authorizing Provider  albuterol (PROVENTIL HFA;VENTOLIN HFA) 108 (90 Base) MCG/ACT inhaler Inhale 2 puffs into the lungs every 6 (six) hours as needed for wheezing or shortness of breath.   Yes [provider]  atorvastatin (LIPITOR) 80 MG tablet Take 80 mg by mouth at bedtime. 06/07/18  Yes [provider]  busPIRone (BUSPAR) 10 MG tablet Take 10 mg by mouth 2 (two) times daily. 02/01/21  Yes [provider]  cloNIDine (CATAPRES) 0.1 MG tablet Take 0.1 mg by mouth 2 (two) times daily. 02/04/20  Yes [provider]  dorzolamide-timolol (COSOPT) 22.3-6.8 MG/ML ophthalmic solution Place 1 drop into the right eye 2 (two) times daily.   Yes [provider]  ergocalciferol (VITAMIN D2) 1.25 MG (50000 UT) capsule Take 50,000 Units by mouth once a week.   Yes [provider]  gabapentin (NEURONTIN) 600 MG tablet Take 600 mg by mouth 3 (three) times daily. 02/01/21  Yes [provider]  HYDROcodone-acetaminophen (NORCO) 10-325 MG tablet Take 1 tablet by mouth every 6 (six) hours as needed for severe pain.  01/15/20  Yes [provider]  hydrOXYzine (VISTARIL) 50 MG capsule Take 100 mg by mouth 2 (two) times daily as needed for anxiety.   Yes [provider]  JANUVIA 50 MG tablet Take 50 mg by mouth daily. 02/01/21  Yes [provider]  losartan (COZAAR) 25 MG tablet Take 1 tablet (25 mg total) by mouth daily. 12/31/18 02/13/21 Yes Turner, Cornelious Bryant, MD  meclizine (ANTIVERT) 25 MG tablet Take 1 tablet (25 mg total) by mouth 3 (three) times daily as needed for dizziness. 08/27/18  Yes Lorre Nick, MD  metFORMIN (GLUCOPHAGE) 1000 MG tablet Take 1,000 mg by  mouth 2 (two) times daily. 01/18/21  Yes [provider]  naproxen sodium (ALEVE) 220 MG tablet Take 220 mg by mouth daily as needed (Pain).   Yes [provider]  oxyCODONE-acetaminophen (PERCOCET) 7.5-325 MG tablet Take 1 tablet by mouth every 6 (six) hours as needed for severe pain.   Yes [provider]  risperiDONE (RISPERDAL) 1 MG tablet Take 1 tablet (1 mg) by mouth in the mornings & 2 tablets (2 mg) at bedtime: For mood control Patient taking differently: Take 1 mg by mouth 2 (two) times daily. 10/18/17  Yes Armandina Stammer I, NP  traZODone (DESYREL) 100 MG tablet Take 100 mg by mouth at bedtime. 02/01/21  Yes [provider]  LANTUS SOLOSTAR 100 UNIT/ML Solostar Pen Inject 2-38 Units into the skin 2 (two) times daily. 22 units in the morning, and 38 units at night. 06/07/18   [provider]  NOVOLOG FLEXPEN 100 UNIT/ML FlexPen Inject 20 Units into the skin 3 (three) times daily with meals.  11/01/18   [provider]    Allergies    Patient has no known allergies.  Review of Systems   Review of Systems  Psychiatric/Behavioral:  Positive for confusion, dysphoric mood and suicidal ideas. The patient is nervous/anxious.        Hallucinations  Homicidal ideations   All other systems reviewed and are negative.  Physical Exam Updated Vital Signs BP (!) 160/88 (BP  Location: Right Arm)   Pulse 89   Temp 98 F (36.7 C) (Oral)   Resp 16   SpO2 98%   Physical Exam Vitals and nursing note reviewed.  Constitutional:      General: He is not in acute distress.    Appearance: He is well-developed.     Comments: NAD.  HENT:     Head: Normocephalic and atraumatic.     Right Ear: External ear normal.     Left Ear: External ear normal.     Nose: Nose normal.  Eyes:     General: No scleral icterus.    Conjunctiva/sclera: Conjunctivae normal.  Cardiovascular:     Rate and Rhythm: Normal rate and regular rhythm.     Heart sounds: Normal heart sounds. No murmur heard. Pulmonary:     Effort: Pulmonary effort is normal.     Breath sounds: Normal breath sounds. No wheezing.  Musculoskeletal:        General: No deformity. Normal range of motion.     Cervical back: Normal range of motion and neck supple.  Skin:    General: Skin is warm and dry.     Capillary Refill: Capillary refill takes less than 2 seconds.  Neurological:     Mental Status: He is alert. He is disoriented.     Comments:   Mental Status: Patient is awake, alert, oriented to person only.  Speech is fluent and clear without dysarthria or aphasia.  No signs of neglect.  Cranial Nerves: I not tested II *left eye blindness*, right visual fields full bilaterally. PERRL.   III, IV, VI EOMs intact without ptosis V sensation to light touch intact in all 3 divisions of trigeminal nerve bilaterally  VII facial movements symmetric bilaterally VIII hearing intact to voice/conversation  IX, X no uvula deviation, symmetric rise of soft palate/uvula XI 5/5 SCM and trapezius strength bilaterally  XII tongue protrusion midline, symmetric L/R movements  Motor: Inconsistent right arm and leg weakness, this is distractable as when patient is observed moving about in bed,  sitting up and walking he has no obvious difficulty using his right arm or ambulating.  Sensation to light touch intact in face,  upper/lower extremities.   Cerebellar: No ataxia with finger to nose, in fact no noted right arm weakness with FTN exam. Able to sit up, swing legs over side of the bed and ambulate 4+ steps independently.   Psychiatric:        Behavior: Behavior normal.        Thought Content: Thought content normal.        Judgment: Judgment normal.    ED Results / Procedures / Treatments   Labs (all labs ordered are listed, but only abnormal results are displayed) Labs Reviewed  COMPREHENSIVE METABOLIC PANEL - Abnormal; Notable for the following components:      Result Value   Glucose, Bld 209 (*)    All other components within normal limits  SALICYLATE LEVEL - Abnormal; Notable for the following components:   Salicylate Lvl <7.0 (*)    All other components within normal limits  ACETAMINOPHEN LEVEL - Abnormal; Notable for the following components:   Acetaminophen (Tylenol), Serum <10 (*)    All other components within normal limits  CBC - Abnormal; Notable for the following components:   WBC 10.6 (*)    All other components within normal limits  ETHANOL  RAPID URINE DRUG SCREEN, HOSP PERFORMED    EKG None  Radiology CT Head Wo Contrast  Result Date: 02/13/2021 CLINICAL DATA:  Confusion. EXAM: CT HEAD WITHOUT CONTRAST TECHNIQUE: Contiguous axial images were obtained from the base of the skull through the vertex without intravenous contrast. COMPARISON:  02/06/2020 FINDINGS: Brain: No evidence of acute infarction, hemorrhage, hydrocephalus, extra-axial collection or mass lesion/mass effect. Vascular: No hyperdense vessel or unexpected calcification. Skull: Normal. Negative for fracture or focal lesion. Sinuses/Orbits: Chronic stable changes left globe. Right orbit is normal. Paranasal sinuses are clear. Other: None. IMPRESSION: No acute findings. Electronically Signed   By: Elberta Fortisaniel  Boyle M.D.   On: 02/13/2021 10:02    Procedures Procedures   Medications Ordered in ED Medications - No data  to display  ED Course  I have reviewed the triage vital signs and the nursing notes.  Pertinent labs & imaging results that were available during my care of the patient were reviewed by me and considered in my medical decision making (see chart for details).    MDM Rules/Calculators/A&P                          60 yo M presents to ED for dysphoric mood, anxiety, SI, HI, visual and auditory hallucinations for 3 days. Disoriented to place, year, events.  Reports bilateral feet tingling.   EMR triage and nursing notes reviewed  Chart review reveals previous ED visits for AMS, hallucinations, cocaine use. Cardiac history.   Initial ER work up including medical clearance labs ordered in triage. These were personally reviewed and interpreted. Given age, confusion, disorientation I ordered head CT. He has inconsistent right arm/leg weakness that seems effort dependent. He is ambulatory without focal weakness. No facial droop. Neuro exam otherwise normal.   Labs reveal - reassuring. Glucose 209, normal AG, Na, K. Normal LFTs. WBC 10.6.  Undetectable ETOH, APAP and ASA levels. Pending UDS.   Imaging reveals - CT head without acute findings.  1020: TTS/NP recommending inpatient treatment. Patient re-evaluated and ambulatory to purple pod. No weakness noted on repeat exam. Awaiting further psych treatment/placement.  Voluntary  at this time, cooperative so far here.   Final Clinical Impression(s) / ED Diagnoses Final diagnoses:  Suicidal ideation    Rx / DC Orders ED Discharge Orders     None        Liberty Handy, PA-C 02/13/21 1021    Margarita Grizzle, MD 02/16/21 1046

## 2021-02-14 ENCOUNTER — Inpatient Hospital Stay (HOSPITAL_COMMUNITY)
Admission: AD | Admit: 2021-02-14 | Discharge: 2021-02-22 | DRG: 885 | Disposition: A | Discharge status: Home / Self Care | Payer: Medicaid Other | Source: Intra-hospital | Attending: Psychiatry | Admitting: Psychiatry

## 2021-02-14 ENCOUNTER — Encounter (HOSPITAL_COMMUNITY): Payer: Self-pay | Admitting: Registered Nurse

## 2021-02-14 ENCOUNTER — Other Ambulatory Visit: Payer: Self-pay | Admitting: Registered Nurse

## 2021-02-14 ENCOUNTER — Other Ambulatory Visit: Payer: Self-pay

## 2021-02-14 DIAGNOSIS — Z794 Long term (current) use of insulin: Secondary | ICD-10-CM | POA: Diagnosis not present

## 2021-02-14 DIAGNOSIS — I10 Essential (primary) hypertension: Secondary | ICD-10-CM | POA: Diagnosis present

## 2021-02-14 DIAGNOSIS — F17210 Nicotine dependence, cigarettes, uncomplicated: Secondary | ICD-10-CM | POA: Diagnosis present

## 2021-02-14 DIAGNOSIS — Z20822 Contact with and (suspected) exposure to covid-19: Secondary | ICD-10-CM | POA: Diagnosis present

## 2021-02-14 DIAGNOSIS — R41843 Psychomotor deficit: Secondary | ICD-10-CM | POA: Diagnosis present

## 2021-02-14 DIAGNOSIS — G4700 Insomnia, unspecified: Secondary | ICD-10-CM | POA: Diagnosis present

## 2021-02-14 DIAGNOSIS — H547 Unspecified visual loss: Secondary | ICD-10-CM | POA: Diagnosis present

## 2021-02-14 DIAGNOSIS — K219 Gastro-esophageal reflux disease without esophagitis: Secondary | ICD-10-CM | POA: Diagnosis present

## 2021-02-14 DIAGNOSIS — R45851 Suicidal ideations: Secondary | ICD-10-CM | POA: Diagnosis present

## 2021-02-14 DIAGNOSIS — F419 Anxiety disorder, unspecified: Secondary | ICD-10-CM | POA: Diagnosis present

## 2021-02-14 DIAGNOSIS — M797 Fibromyalgia: Secondary | ICD-10-CM | POA: Diagnosis present

## 2021-02-14 DIAGNOSIS — F332 Major depressive disorder, recurrent severe without psychotic features: Secondary | ICD-10-CM | POA: Diagnosis present

## 2021-02-14 DIAGNOSIS — J45909 Unspecified asthma, uncomplicated: Secondary | ICD-10-CM | POA: Diagnosis present

## 2021-02-14 DIAGNOSIS — Z8249 Family history of ischemic heart disease and other diseases of the circulatory system: Secondary | ICD-10-CM

## 2021-02-14 DIAGNOSIS — Z833 Family history of diabetes mellitus: Secondary | ICD-10-CM | POA: Diagnosis not present

## 2021-02-14 DIAGNOSIS — E7800 Pure hypercholesterolemia, unspecified: Secondary | ICD-10-CM | POA: Diagnosis present

## 2021-02-14 DIAGNOSIS — F250 Schizoaffective disorder, bipolar type: Secondary | ICD-10-CM | POA: Diagnosis present

## 2021-02-14 DIAGNOSIS — R45850 Homicidal ideations: Secondary | ICD-10-CM | POA: Diagnosis present

## 2021-02-14 DIAGNOSIS — Z79899 Other long term (current) drug therapy: Secondary | ICD-10-CM

## 2021-02-14 DIAGNOSIS — I252 Old myocardial infarction: Secondary | ICD-10-CM | POA: Diagnosis not present

## 2021-02-14 DIAGNOSIS — E119 Type 2 diabetes mellitus without complications: Secondary | ICD-10-CM | POA: Diagnosis present

## 2021-02-14 DIAGNOSIS — Z7984 Long term (current) use of oral hypoglycemic drugs: Secondary | ICD-10-CM | POA: Diagnosis not present

## 2021-02-14 DIAGNOSIS — H9190 Unspecified hearing loss, unspecified ear: Secondary | ICD-10-CM | POA: Diagnosis present

## 2021-02-14 LAB — RESP PANEL BY RT-PCR (FLU A&B, COVID) ARPGX2
Influenza A by PCR: NEGATIVE
Influenza B by PCR: NEGATIVE
SARS Coronavirus 2 by RT PCR: NEGATIVE

## 2021-02-14 LAB — GLUCOSE, CAPILLARY
Glucose-Capillary: 84 mg/dL (ref 70–99)
Glucose-Capillary: 225 mg/dL — ABNORMAL HIGH (ref 70–99)

## 2021-02-14 LAB — CBG MONITORING, ED
Glucose-Capillary: 200 mg/dL — ABNORMAL HIGH (ref 70–99)
Glucose-Capillary: 162 mg/dL — ABNORMAL HIGH (ref 70–99)

## 2021-02-14 MED ORDER — ACETAMINOPHEN 325 MG PO TABS: 650.0000 mg | ORAL_TABLET | Freq: Four times a day (QID) | ORAL | Status: DC | PRN

## 2021-02-14 MED ORDER — ATORVASTATIN CALCIUM 80 MG PO TABS
80.0000 mg | ORAL_TABLET | Freq: Every day | ORAL | Status: DC
  Administered 2021-02-14 – 2021-02-22 (×9): 80 mg via ORAL
  Filled 2021-02-14 (×6): qty 1
  Filled 2021-02-14 (×2): qty 2
  Filled 2021-02-14 (×5): qty 1

## 2021-02-14 MED ORDER — INSULIN GLARGINE 100 UNIT/ML ~~LOC~~ SOLN
38.0000 [IU] | Freq: Every day | SUBCUTANEOUS | Status: DC
  Administered 2021-02-14 – 2021-02-21 (×8): 38 [IU] via SUBCUTANEOUS

## 2021-02-14 MED ORDER — CLONIDINE HCL 0.1 MG PO TABS
0.1000 mg | ORAL_TABLET | Freq: Two times a day (BID) | ORAL | Status: DC
  Administered 2021-02-14 – 2021-02-22 (×16): 0.1 mg via ORAL
  Filled 2021-02-14 (×23): qty 1

## 2021-02-14 MED ORDER — LOSARTAN POTASSIUM 25 MG PO TABS
25.0000 mg | ORAL_TABLET | Freq: Every day | ORAL | Status: DC
  Administered 2021-02-15 – 2021-02-22 (×8): 25 mg via ORAL
  Filled 2021-02-14 (×10): qty 1

## 2021-02-14 MED ORDER — RISPERIDONE 1 MG PO TBDP
1.0000 mg | ORAL_TABLET | Freq: Two times a day (BID) | ORAL | Status: DC
  Administered 2021-02-14 – 2021-02-15 (×2): 1 mg via ORAL
  Filled 2021-02-14 (×6): qty 1

## 2021-02-14 MED ORDER — INSULIN GLARGINE 100 UNIT/ML ~~LOC~~ SOLN
22.0000 [IU] | Freq: Every morning | SUBCUTANEOUS | Status: DC
  Administered 2021-02-15 – 2021-02-22 (×7): 22 [IU] via SUBCUTANEOUS

## 2021-02-14 MED ORDER — INSULIN ASPART 100 UNIT/ML IJ SOLN
20.0000 [IU] | Freq: Three times a day (TID) | INTRAMUSCULAR | Status: DC
  Administered 2021-02-15 (×3): 20 [IU] via SUBCUTANEOUS

## 2021-02-14 MED ORDER — METFORMIN HCL 500 MG PO TABS
1000.0000 mg | ORAL_TABLET | Freq: Two times a day (BID) | ORAL | Status: DC
  Administered 2021-02-14 – 2021-02-22 (×16): 1000 mg via ORAL
  Filled 2021-02-14 (×21): qty 2

## 2021-02-14 MED ORDER — TRAZODONE HCL 100 MG PO TABS
100.0000 mg | ORAL_TABLET | Freq: Every day | ORAL | Status: DC
  Administered 2021-02-14 – 2021-02-21 (×8): 100 mg via ORAL
  Filled 2021-02-14 (×11): qty 1

## 2021-02-14 MED ORDER — BUSPIRONE HCL 10 MG PO TABS
10.0000 mg | ORAL_TABLET | Freq: Two times a day (BID) | ORAL | Status: DC
  Administered 2021-02-14 – 2021-02-22 (×16): 10 mg via ORAL
  Filled 2021-02-14 (×4): qty 1
  Filled 2021-02-14: qty 2
  Filled 2021-02-14 (×12): qty 1
  Filled 2021-02-14: qty 2
  Filled 2021-02-14 (×4): qty 1
  Filled 2021-02-14: qty 2

## 2021-02-14 MED ORDER — MAGNESIUM HYDROXIDE 400 MG/5ML PO SUSP: 30.0000 mL | Freq: Every day | ORAL | Status: DC | PRN

## 2021-02-14 MED ORDER — ALUM & MAG HYDROXIDE-SIMETH 200-200-20 MG/5ML PO SUSP: 30.0000 mL | ORAL | Status: DC | PRN

## 2021-02-14 MED ORDER — GABAPENTIN 300 MG PO CAPS
300.0000 mg | ORAL_CAPSULE | Freq: Three times a day (TID) | ORAL | Status: DC
  Administered 2021-02-14 – 2021-02-22 (×23): 300 mg via ORAL
  Filled 2021-02-14 (×32): qty 1

## 2021-02-14 NOTE — Progress Notes (Signed)
Pt accepted to  [id number]  Patient meets inpatient criteria per Ophelia Shoulder, NP  Dr. Bartholomew Crews is the attending provider.    Call report to [phone]   Aletta Edouard, RN @ [facility] ED notified.     Pt scheduled  to arrive at [facility] before 1500.    Damita Dunnings, MSW, LCSW-A  1:14 PM 02/14/2021

## 2021-02-14 NOTE — ED Notes (Signed)
Patient was given a Snack and Drink. 

## 2021-02-14 NOTE — ED Notes (Signed)
Breakfast Ordered 

## 2021-02-14 NOTE — Progress Notes (Signed)
Psychoeducational Group Note  Date:  02/14/2021 Time:  2047  Group Topic/Focus:  Wrap-Up Group:   The focus of this group is to help patients review their daily goal of treatment and discuss progress on daily workbooks.  Participation Level: Did Not Attend  Participation Quality:  Not Applicable  Affect:  Not Applicable  Cognitive:  Not Applicable  Insight:  Not Applicable  Engagement in Group: Not Applicable  Additional Comments:  The patient did not attend group this evening.   Hazle Coca S 02/14/2021, 8:47 PM

## 2021-02-14 NOTE — Progress Notes (Signed)
[facility] is under review at [facility].  Admit Coordinator requested additional documentation to be faxed to them including medical clearance note and IVC paperwork.   Admit Coordinator also requested for nurse phone number which was provided.    CSW will fax over medical clearance note and notified nurse for IVC paperwork.     Lawsen Arnott, LCSW, LCAS Clincal Social Worker  [facility]

## 2021-02-14 NOTE — ED Notes (Signed)
TTS in use at this time. 

## 2021-02-14 NOTE — Progress Notes (Signed)
   02/14/21 1600  Vital Signs  Temp 97.6 F (36.4 C)  Temp Source Oral  Pulse Rate 75  Pulse Rate Source Dinamap  Resp 18  BP 133/72  BP Location Left Arm  BP Method Automatic  Patient Position (if appropriate) Sitting  Oxygen Therapy  SpO2 100 %  Height and Weight  Height 6\' 2"  (1.88 m)  Weight 123.8 kg  Type of Weight Stated  BSA (Calculated - sq m) 2.54 sq meters  BMI (Calculated) 35.04  Weight in (lb) to have BMI = 25 194.3   Initial Nursing Assessment  D: Patient is a 60 y.o. AA male brought in Voluntarily. Pt. Admits to SI due to the death of the his brother and mother. Pt. Reports feeling lonely and has CAH to [place] himself. When pt. Was in the ED  a voice told him to "take out the knife and kill yourself." Pt. 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  PT. Complains of anxiety, decrease appetite, confusion, crying spells, depression, disorientation, hopeless, worrying, sadness, restless, suspiciousness. Due to left eye blindness and recent fall. PT contracts for safety.  A:  Patient took scheduled medicine.  Support and encouragement provided Routine safety checks conducted every 15 minutes. Patient  Informed to notify staff with any concerns.   R: Pt. Contracts for safety. Safety maintained.

## 2021-02-14 NOTE — ED Notes (Addendum)
Secretary has called Safe transport for this RN for this pt's transfer to [facility], all belongings and appropriate paperwork has been gathered. No transport ETA known at this time.

## 2021-02-14 NOTE — Progress Notes (Signed)
Patient information has been sent to [facility] via secure chat to review for potential admission. Patient meets inpatient criteria per Ophelia Shoulder, NP .   Situation ongoing, CSW will continue to monitor progress.    Signed:  Damita Dunnings, MSW, LCSW-A  02/14/2021 12:00 PM

## 2021-02-14 NOTE — ED Notes (Signed)
Called Safe Transport and gave patient information for pick up. ST will care RN at [phone] upon arrival to pick up patient from ambulance bay.

## 2021-02-14 NOTE — ED Provider Notes (Signed)
Emergency Medicine Observation Re-evaluation Note  Charles Orr is a 60 y.o. male, seen on rounds today.  Pt initially presented to the ED for complaints of Suicidal and Hallucinations Currently, the patient is resting comfortably.  Physical Exam  BP (!) 146/83 (BP Location: Right Arm)   Pulse 61   Temp 97.7 F (36.5 C)   Resp 20   SpO2 92%  Physical Exam General: Resting comfortably, nontoxic-appearing Cardiac: Normal heart Lungs: Normal respiratory rate Psych: Calm  ED Course / MDM  EKG:   I have reviewed the labs performed to date as well as medications administered while in observation.  Recent changes in the last 24 hours include he has been cooperative and redirectable.  Sometimes he thinks his medicines had not been given when in fact they had been..  Plan  Current plan is for psychiatric hospitalization. Patient is not under full IVC at this time.  The patient has been comprehensively evaluated and is medically cleared for admission by psychiatry services.   Mancel Bale, MD 02/14/21 1218

## 2021-02-14 NOTE — Progress Notes (Signed)
Inpatient Diabetes Program Recommendations  AACE/ADA: New Consensus Statement on Inpatient Glycemic Control (2015)  Target Ranges:  Prepandial:   less than 140 mg/dL      Peak postprandial:   less than 180 mg/dL (1-2 hours)      Critically ill patients:  140 - 180 mg/dL   Lab Results  Component Value Date   GLUCAP 162 (H) 02/14/2021   HGBA1C 10.0 (H) 07/09/2017    Review of Glycemic Control Results for JACEYON, STROLE (MRN [id number]) as of 02/14/2021 13:51  Ref. Range 02/13/2021 17:50 02/13/2021 21:53 02/14/2021 07:42 02/14/2021 12:37  Glucose-Capillary Latest Ref Range: 70 - 99 mg/dL 782 (H) 956 (H) 213 (H) 162 (H)   Diabetes history:  DM2 Outpatient Diabetes medications:  Januvia 50 mg QD Lantus 22 units QAM & 38 units QPM Novolog 20 units TID Current orders for Inpatient glycemic control:  Lantus 22 units QAM & 38 units QPM Novolog 20 units TID  Inpatient Diabetes Program Recommendations:    1-Novolog 0-15 units TID and 0-5 units QHS  2-Please consider obtaining a current A1C with next lab draw  Noted patient will transfer from [facility] ED to [facility].  Will continue to follow while inpatient.  Thank you, Dulce Sellar, RN, BSN Diabetes Coordinator Inpatient Diabetes Program [phone] (team pager from 8a-5p)

## 2021-02-14 NOTE — Tx Team (Signed)
Initial Treatment Plan 02/14/2021 5:58 PM Charles Orr GSU:[id number]    PATIENT STRESSORS: Loss of brother and mother Substance abuse   PATIENT STRENGTHS: Ability for insight Average or above average intelligence Communication skills Supportive family/friends   PATIENT IDENTIFIED PROBLEMS: Grief loss of brother and mother  depression  anxiety  Blind in left eye               DISCHARGE CRITERIA:  Ability to meet basic life and health needs Improved stabilization in mood, thinking, and/or behavior Need for constant or close observation no longer present Reduction of life-threatening or endangering symptoms to within safe limits Safe-care adequate arrangements made  PRELIMINARY DISCHARGE PLAN: Placement in alternative living arrangements  PATIENT/FAMILY INVOLVEMENT: This treatment plan has been presented to and reviewed with the patient, Charles Orr.  The patient has beengiven the opportunity to ask questions and make suggestions.  Wardell Heath, RN 02/14/2021, 5:58 PM

## 2021-02-14 NOTE — ED Provider Notes (Addendum)
BH team indicates pt accepted to [facility], Dr Mason Jim  Pt is alert, content, nad. Vitals normal.  Patient appears stable for transport.       Cathren Laine, MD 02/14/21 1535

## 2021-02-14 NOTE — ED Notes (Signed)
Transport arrived for pt to go to [facility], pt remains cooperative. All belongings, valuables, home meds from pharmacy & required paperwork given to transport upon pt's departure.

## 2021-02-15 DIAGNOSIS — F332 Major depressive disorder, recurrent severe without psychotic features: Principal | ICD-10-CM

## 2021-02-15 LAB — GLUCOSE, CAPILLARY
Glucose-Capillary: 180 mg/dL — ABNORMAL HIGH (ref 70–99)
Glucose-Capillary: 92 mg/dL (ref 70–99)
Glucose-Capillary: 186 mg/dL — ABNORMAL HIGH (ref 70–99)
Glucose-Capillary: 86 mg/dL (ref 70–99)

## 2021-02-15 LAB — LIPID PANEL
Cholesterol: 125 mg/dL (ref 0–200)
HDL: 29 mg/dL — ABNORMAL LOW (ref >40)
LDL Cholesterol: 83 mg/dL (ref 0–99)
Total CHOL/HDL Ratio: 4.3 RATIO
Triglycerides: 64 mg/dL (ref <150)
VLDL: 13 mg/dL (ref 0–40)

## 2021-02-15 LAB — TSH: TSH: 1.905 u[IU]/mL (ref 0.350–4.500)

## 2021-02-15 MED ORDER — RISPERIDONE 2 MG PO TBDP
2.0000 mg | ORAL_TABLET | Freq: Every day | ORAL | Status: DC
  Administered 2021-02-15 – 2021-02-21 (×7): 2 mg via ORAL
  Filled 2021-02-15 (×9): qty 1

## 2021-02-15 MED ORDER — INSULIN ASPART 100 UNIT/ML IJ SOLN
0.0000 [IU] | Freq: Three times a day (TID) | INTRAMUSCULAR | Status: DC
  Administered 2021-02-15: 3 [IU] via SUBCUTANEOUS
  Administered 2021-02-16: 11 [IU] via SUBCUTANEOUS
  Administered 2021-02-16 – 2021-02-17 (×2): 5 [IU] via SUBCUTANEOUS
  Administered 2021-02-17: 3 [IU] via SUBCUTANEOUS
  Administered 2021-02-18: 2 [IU] via SUBCUTANEOUS
  Administered 2021-02-19: 5 [IU] via SUBCUTANEOUS
  Administered 2021-02-19: 2 [IU] via SUBCUTANEOUS
  Administered 2021-02-19 – 2021-02-20 (×2): 3 [IU] via SUBCUTANEOUS
  Administered 2021-02-20 (×2): 2 [IU] via SUBCUTANEOUS
  Administered 2021-02-21: 3 [IU] via SUBCUTANEOUS
  Administered 2021-02-21: 5 [IU] via SUBCUTANEOUS
  Administered 2021-02-21: 8 [IU] via SUBCUTANEOUS

## 2021-02-15 MED ORDER — SERTRALINE HCL 100 MG PO TABS
100.0000 mg | ORAL_TABLET | Freq: Every day | ORAL | Status: DC
  Administered 2021-02-15 – 2021-02-22 (×8): 100 mg via ORAL
  Filled 2021-02-15 (×11): qty 1

## 2021-02-15 MED ORDER — RISPERIDONE 1 MG PO TBDP
1.0000 mg | ORAL_TABLET | Freq: Every day | ORAL | Status: DC
  Administered 2021-02-16 – 2021-02-22 (×7): 1 mg via ORAL
  Filled 2021-02-15 (×9): qty 1
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
## 2021-02-15 NOTE — Progress Notes (Signed)
Recreation Therapy Notes  Animal-Assisted Activity (AAA) Program Checklist/Progress Notes Patient Eligibility Criteria Checklist & Daily Group note for Rec Tx Intervention  Date: 6.14.22 Time: 1430 Location: [address]   AAA/T Program Assumption of Risk Form signed by Engineer, production or Parent Legal Guardian YES  Patient is free of allergies or severe asthma YES   Patient reports no fear of animals YES   Patient reports no history of cruelty to animals YES  Patient understands his/her participation is voluntary YES  Patient washes hands before animal contact YES  Patient washes hands after animal contact YES   Education: Charity fundraiser, Appropriate Animal Interaction   Education Outcome: Acknowledges understanding/In group clarification offered/Needs additional education.   Clinical Observations/Feedback: Pt did not attend activity.      Caroll Rancher, LRT/CTRS        Caroll Rancher A 02/15/2021 3:40 PM
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
## 2021-02-15 NOTE — Progress Notes (Signed)
Patient compliant with medications. Interacting well with peers and staff though guarded and minimal. Scheduled medications administered per Provider order. Support and encouragement provided.   Routine safety checks conducted every 15 minutes. Patient notified to inform staff with problems or concerns.  No adverse drug reactions noted. Patient contracts for safety at this time. Will continue to monitor Patient.

## 2021-02-15 NOTE — Progress Notes (Signed)
Adult Psychoeducational Group Note  Date:  02/15/2021 Time:  2:42 PM  Group Topic/Focus:  Goals Group:   The focus of this group is to help patients establish daily goals to achieve during treatment and discuss how the patient can incorporate goal setting into their daily lives to aide in recovery.  Participation Level:  Did Not Attend    Charles Orr 02/15/2021, 2:42 PM

## 2021-02-15 NOTE — H&P (Signed)
Psychiatric Admission Assessment Adult  Patient Identification: Charles Orr MRN:  [id number] Date of Evaluation:  02/15/2021 Chief Complaint:  MDD (major depressive disorder), recurrent severe, without psychosis (HCC) [F33.2] Principal Diagnosis: <principal problem not specified> Diagnosis:  Active Problems:   MDD (major depressive disorder), recurrent severe, without psychosis (HCC)  History of Present Illness:  Patient is seen and examined.  Patient is a 60 year old male who was [facility] to [facility] emergency department on 02/13/2021 by emergency medical services secondary to suicidal ideation and hearing voices for 2 days.  He also endorsed confusion.  He stated he was being followed by a psychiatrist [address], but apparently stated that the provider left the practice and he had not gotten a new one.  The patient reported still taking his medications.  He was admitted to the hospital for evaluation and stabilization.  He is hearing impaired as well is sight impaired.  Associated Signs/Symptoms: Depression Symptoms:  depressed mood, anhedonia, insomnia, psychomotor retardation, fatigue, feelings of worthlessness/guilt, difficulty concentrating, hopelessness, suicidal thoughts without plan, anxiety, loss of energy/fatigue, Duration of Depression Symptoms: Greater than two weeks  (Hypo) Manic Symptoms:  Impulsivity, Irritable Mood, Labiality of Mood, Anxiety Symptoms:  Excessive Worry, Psychotic Symptoms:   Denied PTSD Symptoms: Negative Total Time spent with patient: 30 minutes  Past Psychiatric History: Patient has a history of recurrent major depression with psychotic features as well as cocaine dependence and in the past is also abused opiates.  He is also been diagnosed with bipolar disorder in the past.  His last psychiatric hospitalization in our facility was in February 2019.  His last psychiatric hospitalization prior to that was 2018.  He has  had multiple previous admissions to our facility prior to that.  Is the patient at risk to self? Yes.    Has the patient been a risk to self in the past 6 months? Yes.    Has the patient been a risk to self within the distant past? Yes.    Is the patient a risk to others? No.  Has the patient been a risk to others in the past 6 months? No.  Has the patient been a risk to others within the distant past? No.   Prior Inpatient Therapy:   Prior Outpatient Therapy:    Alcohol Screening: 1. How often do you have a drink containing alcohol?: Monthly or less 2. How many drinks containing alcohol do you have on a typical day when you are drinking?: 1 or 2 3. How often do you have six or more drinks on one occasion?: Less than monthly AUDIT-C Score: 2 Substance Abuse History in the last 12 months:  Yes.   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   Community acquired pneumonia of right lower lobe of lung    Degenerative disc disease, lumbar    Depression    DM (diabetes mellitus) (HCC)    Fibromyalgia    GERD (gastroesophageal reflux disease)    High cholesterol    Hyperglycemia  Hypertension    Localized edema    Myocardial infarction [facility]) 2002   Osteoarthritis    Other schizophrenia (HCC)    Pneumonia 1989   Polysubstance (including opioids) dependence with physiological dependence (HCC) 10/06/2017   Schizoaffective disorder, bipolar type (HCC) 06/16/2017   Schizophrenia (HCC)    Shortness of breath  06/10/2017   Tobacco abuse 07/09/2017   Type II diabetes mellitus [facility])     Past Surgical History:  Procedure Laterality Date   CARDIAC CATHETERIZATION  12/2006   [name] 01/18/2011   INGUINAL HERNIA REPAIR Right    LEFT HEART CATH AND CORONARY ANGIOGRAPHY N/A 07/11/2017   Procedure: LEFT HEART CATH AND CORONARY ANGIOGRAPHY;  Surgeon: Tonny Bollman, MD;  Location: [facility] INVASIVE CV LAB;  Service: Cardiovascular;  Laterality: N/A;   PARATHYROIDECTOMY     PERICARDIAL TAP  2000   ?; in [place]/notes 01/18/2011   Family History:  Family History  Problem Relation Age of Onset   Hypertension Mother    Diabetes Mother    Heart disease Father    Hypertension Sister    Diabetes Sister    Hypertension Brother    Diabetes Brother    Hypertension Sister    Diabetes Sister    Hypertension Brother    Diabetes Brother    Family Psychiatric  History: Patient denied any family psychiatric history Tobacco Screening:   Social History:  Social History   Substance and Sexual Activity  Alcohol Use Yes   Comment: 06/12/2017 "stopped ~ 1 yr ago"      Social History   Substance and Sexual Activity  Drug Use Yes   Types: Cocaine   Comment: 06/12/2017 "stopped using cocaine ~ 1 yr ago; used marijuana when I was younger" last cocaine use 9 mths ago per pt on 06/15/17    Additional Social History: Marital status: Divorced Divorced, when?: when my children were young What types of issues is patient dealing with in the relationship?: none Additional relationship information: currently single Are you sexually active?: No What is your sexual orientation?: straight Has your sexual activity been affected by drugs, alcohol, medication, or emotional stress?: no Does patient have children?: Yes How many children?:  (did not report, at least 2) How is patient's relationship with their children?: good                         Allergies:  No Known Allergies Lab Results:  Results for orders  placed or performed during the hospital encounter of 02/14/21 (from the past 48 hour(s))  Glucose, capillary     Status: None   Collection Time: 02/14/21  4:57 PM  Result Value Ref Range   Glucose-Capillary 84 70 - 99 mg/dL    Comment: Glucose reference range applies only to samples taken after fasting for at least 8 hours.  Resp Panel by RT-PCR (Flu A&B, Covid) Nasopharyngeal Swab     Status: None   Collection Time: 02/14/21  7:08 PM   Specimen: Nasopharyngeal Swab; Nasopharyngeal(NP) swabs in vial transport medium  Result Value Ref Range   SARS Coronavirus 2 by RT PCR NEGATIVE NEGATIVE    Comment: (NOTE) SARS-CoV-2 target nucleic acids are NOT DETECTED.  The SARS-CoV-2 RNA is generally detectable in upper respiratory specimens during the acute phase of infection. The lowest concentration of SARS-CoV-2 viral copies this assay can detect is 138 copies/mL. A negative result does not preclude SARS-Cov-2 infection and should not be used as the sole basis for treatment  or other patient management decisions. A negative result may occur with  improper specimen collection/handling, submission of specimen other than nasopharyngeal swab, presence of viral mutation(s) within the areas targeted by this assay, and inadequate number of viral copies(<138 copies/mL). A negative result must be combined with clinical observations, patient history, and epidemiological information. The expected result is Negative.  Fact Sheet for Patients:  BloggerCourse.com  Fact Sheet for Healthcare Providers:  SeriousBroker.it  This test is no t yet approved or cleared by the [place] FDA and  has been authorized for detection and/or diagnosis of SARS-CoV-2 by FDA under an Emergency Use Authorization (EUA). This EUA will remain  in effect (meaning this test can be used) for the duration of the COVID-19 declaration under Section 564(b)(1) of the Act,  21 U.S.C.section 360bbb-3(b)(1), unless the authorization is terminated  or revoked sooner.       Influenza A by PCR NEGATIVE NEGATIVE   Influenza B by PCR NEGATIVE NEGATIVE    Comment: (NOTE) The Xpert Xpress SARS-CoV-2/FLU/RSV plus assay is intended as an aid in the diagnosis of influenza from Nasopharyngeal swab specimens and should not be used as a sole basis for treatment. Nasal washings and aspirates are unacceptable for Xpert Xpress SARS-CoV-2/FLU/RSV testing.  Fact Sheet for Patients: BloggerCourse.com  Fact Sheet for Healthcare Providers: SeriousBroker.it  This test is not yet approved or cleared by the [place] FDA and has been authorized for detection and/or diagnosis of SARS-CoV-2 by FDA under an Emergency Use Authorization (EUA). This EUA will remain in effect (meaning this test can be used) for the duration of the COVID-19 declaration under Section 564(b)(1) of the Act, 21 U.S.C. section 360bbb-3(b)(1), unless the authorization is terminated or revoked.  Performed at [facility], [address] [place], [place] [address]   Glucose, capillary     Status: Abnormal   Collection Time: 02/14/21  9:23 PM  Result Value Ref Range   Glucose-Capillary 225 (H) 70 - 99 mg/dL    Comment: Glucose reference range applies only to samples taken after fasting for at least 8 hours.   Comment 1 Notify RN   Glucose, capillary     Status: Abnormal   Collection Time: 02/15/21  5:45 AM  Result Value Ref Range   Glucose-Capillary 180 (H) 70 - 99 mg/dL    Comment: Glucose reference range applies only to samples taken after fasting for at least 8 hours.  TSH     Status: None   Collection Time: 02/15/21  6:07 AM  Result Value Ref Range   TSH 1.905 0.350 - 4.500 uIU/mL    Comment: Performed by a 3rd Generation assay with a functional sensitivity of <=0.01 uIU/mL. Performed at [facility],  [address] [place], [place] [address]   Lipid panel     Status: Abnormal   Collection Time: 02/15/21  6:07 AM  Result Value Ref Range   Cholesterol 125 0 - 200 mg/dL   Triglycerides 64 <829 mg/dL   HDL 29 (L) >56 mg/dL   Total CHOL/HDL Ratio 4.3 RATIO   VLDL 13 0 - 40 mg/dL   LDL Cholesterol 83 0 - 99 mg/dL    Comment:        Total Cholesterol/HDL:CHD Risk Coronary Heart Disease Risk Table                     Men   Women  1/2 Average Risk   3.4   3.3  Average  Risk       5.0   4.4  2 X Average Risk   9.6   7.1  3 X Average Risk  23.4   11.0        Use the calculated Patient Ratio above and the CHD Risk Table to determine the patient's CHD Risk.        ATP III CLASSIFICATION (LDL):  <100     mg/dL   Optimal  182-993  mg/dL   Near or Above                    Optimal  130-159  mg/dL   Borderline  716-967  mg/dL   High  >893     mg/dL   Very High Performed at [facility], [address] [place], [place] [address]   Glucose, capillary     Status: None   Collection Time: 02/15/21 11:57 AM  Result Value Ref Range   Glucose-Capillary 92 70 - 99 mg/dL    Comment: Glucose reference range applies only to samples taken after fasting for at least 8 hours.    Blood Alcohol level:  Lab Results  Component Value Date   ETH <10 02/13/2021   ETH <10 10/07/2017    Metabolic Disorder Labs:  Lab Results  Component Value Date   HGBA1C 10.0 (H) 07/09/2017   MPG 240.3 07/09/2017   MPG 225.95 06/10/2017   No results found for: PROLACTIN Lab Results  Component Value Date   CHOL 125 02/15/2021   TRIG 64 02/15/2021   HDL 29 (L) 02/15/2021   CHOLHDL 4.3 02/15/2021   VLDL 13 02/15/2021   LDLCALC 83 02/15/2021   LDLCALC 43 06/10/2017    Current Medications: Current Facility-Administered Medications  Medication Dose Route Frequency Provider Last Rate Last Admin   acetaminophen (TYLENOL) tablet 650 mg  650 mg Oral Q6H PRN Rankin, Shuvon B, NP       alum &  mag hydroxide-simeth (MAALOX/MYLANTA) 200-200-20 MG/5ML suspension 30 mL  30 mL Oral Q4H PRN Rankin, Shuvon B, NP       atorvastatin (LIPITOR) tablet 80 mg  80 mg Oral Daily Laveda Abbe, NP   80 mg at 02/15/21 0758   busPIRone (BUSPAR) tablet 10 mg  10 mg Oral BID Laveda Abbe, NP   10 mg at 02/15/21 0758   cloNIDine (CATAPRES) tablet 0.1 mg  0.1 mg Oral BID Laveda Abbe, NP   0.1 mg at 02/15/21 0758   gabapentin (NEURONTIN) capsule 300 mg  300 mg Oral TID Laveda Abbe, NP   300 mg at 02/15/21 1210   insulin aspart (novoLOG) injection 0-15 Units  0-15 Units Subcutaneous TID WC Antonieta Pert, MD       insulin aspart (novoLOG) injection 20 Units  20 Units Subcutaneous TID WC Laveda Abbe, NP   20 Units at 02/15/21 1208   insulin glargine (LANTUS) injection 22 Units  22 Units Subcutaneous q AM Laveda Abbe, NP   22 Units at 02/15/21 0626   insulin glargine (LANTUS) injection 38 Units  38 Units Subcutaneous QHS Laveda Abbe, NP   38 Units at 02/14/21 2151   losartan (COZAAR) tablet 25 mg  25 mg Oral Daily Laveda Abbe, NP   25 mg at 02/15/21 5102   magnesium hydroxide (MILK OF MAGNESIA) suspension 30 mL  30 mL Oral Daily PRN Rankin, Shuvon B, NP       metFORMIN (GLUCOPHAGE) tablet  1,000 mg  1,000 mg Oral BID WC Laveda Abbe, NP   1,000 mg at 02/15/21 0757   [START ON 02/16/2021] risperiDONE (RISPERDAL M-TABS) disintegrating tablet 1 mg  1 mg Oral Daily Antonieta Pert, MD       risperiDONE (RISPERDAL M-TABS) disintegrating tablet 2 mg  2 mg Oral QHS Antonieta Pert, MD       sertraline (ZOLOFT) tablet 100 mg  100 mg Oral Daily Antonieta Pert, MD   100 mg at 02/15/21 1454   traZODone (DESYREL) tablet 100 mg  100 mg Oral QHS Laveda Abbe, NP   100 mg at 02/14/21 2148   PTA Medications: Medications Prior to Admission  Medication Sig Dispense Refill Last Dose   albuterol (PROVENTIL HFA;VENTOLIN HFA)  108 (90 Base) MCG/ACT inhaler Inhale 2 puffs into the lungs every 6 (six) hours as needed for wheezing or shortness of breath.      atorvastatin (LIPITOR) 80 MG tablet Take 80 mg by mouth at bedtime.  3    busPIRone (BUSPAR) 10 MG tablet Take 10 mg by mouth 2 (two) times daily.      cloNIDine (CATAPRES) 0.1 MG tablet Take 0.1 mg by mouth 2 (two) times daily.      dorzolamide-timolol (COSOPT) 22.3-6.8 MG/ML ophthalmic solution Place 1 drop into the right eye 2 (two) times daily.      ergocalciferol (VITAMIN D2) 1.25 MG (50000 UT) capsule Take 50,000 Units by mouth once a week.      gabapentin (NEURONTIN) 600 MG tablet Take 600 mg by mouth 3 (three) times daily.      HYDROcodone-acetaminophen (NORCO) 10-325 MG tablet Take 1 tablet by mouth every 6 (six) hours as needed for severe pain.      hydrOXYzine (VISTARIL) 50 MG capsule Take 100 mg by mouth 2 (two) times daily as needed for anxiety.      JANUVIA 50 MG tablet Take 50 mg by mouth daily.      LANTUS SOLOSTAR 100 UNIT/ML Solostar Pen Inject 2-38 Units into the skin 2 (two) times daily. 22 units in the morning, and 38 units at night.  3    losartan (COZAAR) 25 MG tablet Take 1 tablet (25 mg total) by mouth daily. 90 tablet 3    meclizine (ANTIVERT) 25 MG tablet Take 1 tablet (25 mg total) by mouth 3 (three) times daily as needed for dizziness. 30 tablet 0    metFORMIN (GLUCOPHAGE) 1000 MG tablet Take 1,000 mg by mouth 2 (two) times daily.      naproxen sodium (ALEVE) 220 MG tablet Take 220 mg by mouth daily as needed (Pain).      NOVOLOG FLEXPEN 100 UNIT/ML FlexPen Inject 20 Units into the skin 3 (three) times daily with meals.       oxyCODONE-acetaminophen (PERCOCET) 7.5-325 MG tablet Take 1 tablet by mouth every 6 (six) hours as needed for severe pain.      risperiDONE (RISPERDAL) 1 MG tablet Take 1 tablet (1 mg) by mouth in the mornings & 2 tablets (2 mg) at bedtime: For mood control (Patient taking differently: Take 1 mg by mouth 2 (two) times  daily.) 90 tablet 0    traZODone (DESYREL) 100 MG tablet Take 100 mg by mouth at bedtime.       Musculoskeletal: Strength & Muscle Tone: within normal limits Gait & Station: normal Patient leans: N/A            Psychiatric Specialty Exam:  Presentation  General  Appearance: Disheveled  Eye Contact:Poor  Speech:Normal Rate  Speech Volume:Normal  Handedness:Right   Mood and Affect  Mood:Depressed; Dysphoric  Affect:Flat   Thought Process  Thought Processes:Goal Directed  Duration of Psychotic Symptoms: Greater than six months  Past Diagnosis of Schizophrenia or Psychoactive disorder: Yes  Descriptions of Associations:Circumstantial  Orientation:Full (Time, Place and Person)  Thought Content:Delusions; Paranoid Ideation  Hallucinations:Hallucinations: Auditory  Ideas of Reference:Delusions; Paranoia  Suicidal Thoughts:Suicidal Thoughts: Yes, Passive SI Passive Intent and/or Plan: Without Intent  Homicidal Thoughts:Homicidal Thoughts: No   Sensorium  Memory:Immediate Poor; Recent Poor; Remote Poor  Judgment:Intact  Insight:Fair   Executive Functions  Concentration:Fair  Attention Span:Fair  Recall:Poor  Fund of Knowledge:Fair  Language:Fair   Psychomotor Activity  Psychomotor Activity:Psychomotor Activity: Decreased   Assets  Assets:Desire for Improvement; Resilience; Housing   Sleep  Sleep:Sleep: Fair Number of Hours of Sleep: 5.5    Physical Exam: Physical Exam Vitals and nursing note reviewed.  HENT:     Head: Normocephalic and atraumatic.  Pulmonary:     Effort: Pulmonary effort is normal.  Neurological:     General: No focal deficit present.     Mental Status: He is alert and oriented to person, place, and time.   Review of Systems  Constitutional:  Positive for malaise/fatigue.  HENT:  Positive for hearing loss.   Eyes:  Positive for blurred vision.  Psychiatric/Behavioral:  Positive for depression.   All  other systems reviewed and are negative. Blood pressure (!) 146/73, pulse 91, temperature 97.6 F (36.4 C), temperature source Oral, resp. rate 18, height 6\' 2"  (1.88 m), weight 123.8 kg, SpO2 100 %. Body mass index is 35.05 kg/m.  Treatment Plan Summary: Daily contact with patient to assess and evaluate symptoms and progress in treatment, Medication management, and Plan   Patient is seen and examined.  Patient is a 60 year old male with the above-stated past psychiatric history who is seen on admission.  He will be admitted to the hospital.  He will be integrated in the milieu.  He will be encouraged to attend groups.  We do not have any recent visits in the chart from psychiatry.  His last admission to our facility was in 2019.  At that time his psychiatric medications included Risperdal 1 mg p.o. daily and 2 mg p.o. nightly as well as sertraline 100 mg p.o. daily.  He was also on trazodone 100 mg p.o. nightly as needed.  The emergency room note from 6/12 showed Risperdal 1 mg p.o. daily and 2 mg p.o. nightly, but that was from a prescription in 2019 at discharge.  His list of medicines from endocrinology included metformin, Lantus insulin, NovoLog FlexPen, and exenatide.  I will write for those and see if we have them on our formulary.  We will have to contact his pharmacy for all of his list of medications.  Review of his admission laboratories revealed a glucose of 92 this morning.  It was 209 in the emergency department.  His creatinine was 1.16.  Liver function enzymes were normal.  Lipid panel was normal.  CBC was normal.  Differential was not done.  Acetaminophen was less than 10, salicylate less than 7.  TSH was normal at 1.905.  Respiratory panel was negative.  Blood alcohol was less than 10.  Salicylate less than 7.  Drug screen was positive for cocaine.  He had a CT scan of the head that showed no acute findings.  EKG was not done.  His blood pressure is  mildly elevated at 140/74 and repeat was  146/73.  Pulse was 74.  Pulse oximetry was 100% on room air.  He got 5.75 hours of sleep last night.  Observation Level/Precautions:  Detox 15 minute checks  Laboratory:  CBC Chemistry Profile Folic Acid GGT HbAIC HCG UDS UA  Psychotherapy:    Medications:    Consultations:    Discharge Concerns:    Estimated LOS:  Other:     Physician Treatment Plan for Primary Diagnosis: <principal problem not specified> Long Term Goal(s): Improvement in symptoms so as ready for discharge  Short Term Goals: Ability to identify changes in lifestyle to reduce recurrence of condition will improve, Ability to verbalize feelings will improve, Ability to disclose and discuss suicidal ideas, Ability to demonstrate self-control will improve, Ability to identify and develop effective coping behaviors will improve, Ability to maintain clinical measurements within normal limits will improve, Compliance with prescribed medications will improve, and Ability to identify triggers associated with substance abuse/mental health issues will improve  Physician Treatment Plan for Secondary Diagnosis: Active Problems:   MDD (major depressive disorder), recurrent severe, without psychosis (HCC)  Long Term Goal(s): Improvement in symptoms so as ready for discharge  Short Term Goals: Ability to identify changes in lifestyle to reduce recurrence of condition will improve, Ability to verbalize feelings will improve, Ability to disclose and discuss suicidal ideas, Ability to demonstrate self-control will improve, Ability to identify and develop effective coping behaviors will improve, Ability to maintain clinical measurements within normal limits will improve, Compliance with prescribed medications will improve, and Ability to identify triggers associated with substance abuse/mental health issues will improve  I certify that inpatient services furnished can reasonably be expected to improve the patient's condition.    Antonieta Pert, MD 6/14/20225:05 PM

## 2021-02-15 NOTE — Progress Notes (Signed)
Adult Psychoeducational Group Note  Date:  02/15/2021 Time:  4:51 PM  Group Topic/Focus:  Healthy Communication:   The focus of this group is to discuss communication, barriers to communication, as well as healthy ways to communicate with others.  Participation Level:  Did Not Attend   Margaret Pyle 02/15/2021, 4:51 PM

## 2021-02-15 NOTE — Progress Notes (Signed)
   02/15/21 1900  Psych Admission Type (Psych Patients Only)  Admission Status Voluntary  Psychosocial Assessment  Patient Complaints Anxiety;Depression  Eye Contact Fair  Facial Expression Flat  Affect Depressed  Speech Logical/coherent  Interaction Assertive  Motor Activity Slow  Appearance/Hygiene Body odor;Disheveled;Poor hygiene  Behavior Characteristics Cooperative  Mood Depressed;Sad  Thought Process  Coherency WDL  Content WDL  Delusions None reported or observed  Perception WDL  Hallucination Auditory  Judgment WDL  Confusion WDL  Danger to Self  Current suicidal ideation? Denies  Danger to Others  Danger to Others None reported or observed

## 2021-02-16 LAB — GLUCOSE, CAPILLARY
Glucose-Capillary: 114 mg/dL — ABNORMAL HIGH (ref 70–99)
Glucose-Capillary: 65 mg/dL — ABNORMAL LOW (ref 70–99)
Glucose-Capillary: 262 mg/dL — ABNORMAL HIGH (ref 70–99)
Glucose-Capillary: 306 mg/dL — ABNORMAL HIGH (ref 70–99)
Glucose-Capillary: 217 mg/dL — ABNORMAL HIGH (ref 70–99)
Glucose-Capillary: 203 mg/dL — ABNORMAL HIGH (ref 70–99)

## 2021-02-16 LAB — HEMOGLOBIN A1C
Hgb A1c MFr Bld: 8.6 % — ABNORMAL HIGH (ref 4.8–5.6)
Mean Plasma Glucose: 200 mg/dL

## 2021-02-16 MED ORDER — INSULIN ASPART 100 UNIT/ML IJ SOLN
15.0000 [IU] | Freq: Three times a day (TID) | INTRAMUSCULAR | Status: DC
  Administered 2021-02-16 – 2021-02-22 (×18): 15 [IU] via SUBCUTANEOUS

## 2021-02-16 NOTE — [facility] Suicide Risk Assessment (Signed)
[facility] INPATIENT:  Family/Significant Other Suicide Prevention Education  Suicide Prevention Education:  Education Completed; Lantz Hermann,  brother of patient, as well as with patient himself.  has been identified by the patient as the family member/significant other with whom the patient will be residing, and identified as the person(s) who will aid the patient in the event of a mental health crisis (suicidal ideations/suicide attempt).  With written consent from the patient, the family member/significant other has been provided the following suicide prevention education, prior to the and/or following the discharge of the patient.  Patient brother lives in [place] and reports that he doesn't have much physical contact with the patient.  Brother just learned of patients admission to the hospital last night. Patient brother unable to give a lot of collateral information and was unaware that patient hears voices. Patient brother reports that they talk regularly but he has not heard of anything alarming accept that he has been having a hard time sleeping. Patient brother unsure how family would be able to support them from [place].  Patient brother requested that he stay informed about how patient was doing and next steps.  Patient brother reports that patient has a son that lives in the area but unsure how much contact they have.  Patient brother reports that patient lives independently.    Patient has reported he has no guns or weapons in the house that could be used to hurt himself.  Patient was also provided information about suicide prevention and places to go if he feels he is in crisis.  Patient reports that he can go home after inpatient stay.   The suicide prevention education provided includes the following: Suicide risk factors Suicide prevention and interventions National Suicide Hotline telephone number [facility] assessment telephone number [facility] Emergency  Assistance 911 [facility] and/or Residential Mobile Crisis Unit telephone number   Gardiner Sleeper [facility] 02/16/2021, 1:17 PM

## 2021-02-16 NOTE — Progress Notes (Signed)
Pt c/o feeling restless and having trouble sleeping at night. He said that for the past month and a 1/2 he has had insomnia. He has only been sleeping 2-3 hours at night. He endorses command AH of voices telling him to hurt himself and VH of people looking at him. He denies any intent or plan to act on the voices. Pt denies SI/HI and verbally contracts for safety. He was administered his schedule trazodone at bedtime to help him sleep. He rated his anxiety and depression an 8 on a scale of 0-10 (10 being the worse) tonight. Pt came out of his room a few times last night. His goal is for his hallucinations to stop. Active listening, reassurance, and support provided. Medications administered as ordered by provider. Q 15 min safety checks continue. Pt's safety has been maintained.   02/15/21 1945  Psych Admission Type (Psych Patients Only)  Admission Status Voluntary  Psychosocial Assessment  Patient Complaints Anxiety;Depression;Insomnia  Eye Contact Fair  Facial Expression Flat  Affect Depressed;Appropriate to circumstance  Speech Logical/coherent  Interaction Assertive  Motor Activity Slow  Appearance/Hygiene Poor hygiene;Disheveled  Behavior Characteristics Cooperative;Appropriate to situation  Mood Depressed;Anxious;Sad;Pleasant  Thought Process  Coherency WDL  Content WDL  Delusions None reported or observed  Perception Hallucinations  Hallucination Visual;Auditory;Command  Judgment Poor  Confusion None  Danger to Self  Current suicidal ideation? Denies  Danger to Others  Danger to Others None reported or observed

## 2021-02-16 NOTE — Progress Notes (Signed)
D- Patient alert and oriented. Patient affect/mood reported as stable. Denies SI, HI, AVH, and pain at this time. Staff continues to monitor blood sugar level and advised to follow diabetic diet.    A- Scheduled medications administered to patient, per MD orders. Support and encouragement provided.  Routine safety checks conducted every 15 minutes.  Patient informed to notify staff with problems or concerns.  R- No adverse drug reactions noted. Patient contracts for safety at this time. Patient compliant with medications and treatment plan. Patient receptive, calm, and cooperative. Patient interacts well with others on the unit.  Patient remains safe at this time.             [place] NOVEL CORONAVIRUS (COVID-19) DAILY CHECK-OFF SYMPTOMS - answer yes or no to each - every day NO YES  Have you had a fever in the past 24 hours?  Fever (Temp > 37.80C / 100F) X    Have you had any of these symptoms in the past 24 hours? New Cough  Sore Throat   Shortness of Breath  Difficulty Breathing  Unexplained Body Aches   X    Have you had any one of these symptoms in the past 24 hours not related to allergies?   Runny Nose  Nasal Congestion  Sneezing   X    If you have had runny nose, nasal congestion, sneezing in the past 24 hours, has it worsened?   X    EXPOSURES - check yes or no X    Have you traveled outside the state in the past 14 days?   X    Have you been in contact with someone with a confirmed diagnosis of COVID-19 or PUI in the past 14 days without wearing appropriate PPE?   X    Have you been living in the same home as a person with confirmed diagnosis of COVID-19 or a PUI (household contact)?     X    Have you been diagnosed with COVID-19?     X                                                                                                                             What to do next: Answered NO to all: Answered YES to anything:    Proceed with unit schedule Follow the  [facility] Inpatient Flowsheet.

## 2021-02-16 NOTE — Progress Notes (Signed)
Recreation Therapy Notes  Date: 6.15.22 Time: 0930 Location: 300 [address] Dayroom  Group Topic: Stress Management   Goal Area(s) Addresses:  Patient will actively participate in stress management techniques presented during session.  Patient will successfully identify benefit of practicing stress management post d/c.   Behavioral Response: None  Intervention: Guided exercise with ambient sound and script  Activity :Guided Imagery  LRT provided education, instruction, and demonstration on practice of visualization via guided imagery. Patient was asked to participate in the technique introduced during session. LRT also debriefed including topics of mindfulness, stress management and specific scenarios each patient could use these techniques. Patients were given suggestions of ways to access scripts post d/c and encouraged to explore Youtube and other apps available on smartphones, tablets, and computers.   Education:  Stress Management, Discharge Planning.   Education Outcome: Acknowledges education  Clinical Observations/Feedback: Patient came in at the end of group.    Caroll Rancher, LRT/CTRS      Caroll Rancher A 02/16/2021 11:29 AM

## 2021-02-16 NOTE — Progress Notes (Signed)
Psychoeducational Group Note  Date:  02/16/2021 Time:  2101  Group Topic/Focus:  Wrap-Up Group:   The focus of this group is to help patients review their daily goal of treatment and discuss progress on daily workbooks.  Participation Level: Did Not Attend  Participation Quality:  Not Applicable  Affect:  Not Applicable  Cognitive:  Not Applicable  Insight:  Not Applicable  Engagement in Group: Not Applicable  Additional Comments:  The patient did not attend group this evening.   Hazle Coca S 02/16/2021, 9:01 PM

## 2021-02-16 NOTE — Tx Team (Signed)
Interdisciplinary Treatment and Diagnostic Plan Update  02/16/2021 Time of Session: 9:50am  Charles Orr MRN: [id number]  Principal Diagnosis: <principal problem not specified>  Secondary Diagnoses: Active Problems:   MDD (major depressive disorder), recurrent severe, without psychosis (HCC)   Current Medications:  Current Facility-Administered Medications  Medication Dose Route Frequency Provider Last Rate Last Admin   acetaminophen (TYLENOL) tablet 650 mg  650 mg Oral Q6H PRN Rankin, Shuvon B, NP       alum & mag hydroxide-simeth (MAALOX/MYLANTA) 200-200-20 MG/5ML suspension 30 mL  30 mL Oral Q4H PRN Rankin, Shuvon B, NP       atorvastatin (LIPITOR) tablet 80 mg  80 mg Oral Daily Laveda Abbe, NP   80 mg at 02/16/21 0741   busPIRone (BUSPAR) tablet 10 mg  10 mg Oral BID Laveda Abbe, NP   10 mg at 02/16/21 0741   cloNIDine (CATAPRES) tablet 0.1 mg  0.1 mg Oral BID Laveda Abbe, NP   0.1 mg at 02/16/21 9485   gabapentin (NEURONTIN) capsule 300 mg  300 mg Oral TID Laveda Abbe, NP   300 mg at 02/16/21 1144   insulin aspart (novoLOG) injection 0-15 Units  0-15 Units Subcutaneous TID WC Antonieta Pert, MD   11 Units at 02/16/21 1151   insulin aspart (novoLOG) injection 15 Units  15 Units Subcutaneous TID WC Antonieta Pert, MD   15 Units at 02/16/21 1152   insulin glargine (LANTUS) injection 22 Units  22 Units Subcutaneous q AM Laveda Abbe, NP   22 Units at 02/15/21 0626   insulin glargine (LANTUS) injection 38 Units  38 Units Subcutaneous QHS Laveda Abbe, NP   38 Units at 02/15/21 2104   losartan (COZAAR) tablet 25 mg  25 mg Oral Daily Laveda Abbe, NP   25 mg at 02/16/21 0741   magnesium hydroxide (MILK OF MAGNESIA) suspension 30 mL  30 mL Oral Daily PRN Rankin, Shuvon B, NP       metFORMIN (GLUCOPHAGE) tablet 1,000 mg  1,000 mg Oral BID WC Laveda Abbe, NP   1,000 mg at 02/16/21 0740   risperiDONE (RISPERDAL  M-TABS) disintegrating tablet 1 mg  1 mg Oral Daily Antonieta Pert, MD   1 mg at 02/16/21 0741   risperiDONE (RISPERDAL M-TABS) disintegrating tablet 2 mg  2 mg Oral QHS Antonieta Pert, MD   2 mg at 02/15/21 2106   sertraline (ZOLOFT) tablet 100 mg  100 mg Oral Daily Antonieta Pert, MD   100 mg at 02/16/21 0741   traZODone (DESYREL) tablet 100 mg  100 mg Oral QHS Laveda Abbe, NP   100 mg at 02/15/21 2106   PTA Medications: Medications Prior to Admission  Medication Sig Dispense Refill Last Dose   albuterol (PROVENTIL HFA;VENTOLIN HFA) 108 (90 Base) MCG/ACT inhaler Inhale 2 puffs into the lungs every 6 (six) hours as needed for wheezing or shortness of breath.      atorvastatin (LIPITOR) 80 MG tablet Take 80 mg by mouth at bedtime.  3    busPIRone (BUSPAR) 10 MG tablet Take 10 mg by mouth 2 (two) times daily.      cloNIDine (CATAPRES) 0.1 MG tablet Take 0.1 mg by mouth 2 (two) times daily.      dorzolamide-timolol (COSOPT) 22.3-6.8 MG/ML ophthalmic solution Place 1 drop into the right eye 2 (two) times daily.      ergocalciferol (VITAMIN D2) 1.25 MG (50000 UT) capsule Take  50,000 Units by mouth once a week.      gabapentin (NEURONTIN) 600 MG tablet Take 600 mg by mouth 3 (three) times daily.      HYDROcodone-acetaminophen (NORCO) 10-325 MG tablet Take 1 tablet by mouth every 6 (six) hours as needed for severe pain.      hydrOXYzine (VISTARIL) 50 MG capsule Take 100 mg by mouth 2 (two) times daily as needed for anxiety.      JANUVIA 50 MG tablet Take 50 mg by mouth daily.      LANTUS SOLOSTAR 100 UNIT/ML Solostar Pen Inject 2-38 Units into the skin 2 (two) times daily. 22 units in the morning, and 38 units at night.  3    losartan (COZAAR) 25 MG tablet Take 1 tablet (25 mg total) by mouth daily. 90 tablet 3    meclizine (ANTIVERT) 25 MG tablet Take 1 tablet (25 mg total) by mouth 3 (three) times daily as needed for dizziness. 30 tablet 0    metFORMIN (GLUCOPHAGE) 1000 MG  tablet Take 1,000 mg by mouth 2 (two) times daily.      naproxen sodium (ALEVE) 220 MG tablet Take 220 mg by mouth daily as needed (Pain).      NOVOLOG FLEXPEN 100 UNIT/ML FlexPen Inject 20 Units into the skin 3 (three) times daily with meals.       oxyCODONE-acetaminophen (PERCOCET) 7.5-325 MG tablet Take 1 tablet by mouth every 6 (six) hours as needed for severe pain.      risperiDONE (RISPERDAL) 1 MG tablet Take 1 tablet (1 mg) by mouth in the mornings & 2 tablets (2 mg) at bedtime: For mood control (Patient taking differently: Take 1 mg by mouth 2 (two) times daily.) 90 tablet 0    traZODone (DESYREL) 100 MG tablet Take 100 mg by mouth at bedtime.       Patient Stressors: Loss of brother and mother Substance abuse  Patient Strengths: Ability for insight Average or above average intelligence Communication skills Supportive family/friends  Treatment Modalities: Medication Management, Group therapy, Case management,  1 to 1 session with clinician, Psychoeducation, Recreational therapy.   Physician Treatment Plan for Primary Diagnosis: <principal problem not specified> Long Term Goal(s): Improvement in symptoms so as ready for discharge   Short Term Goals: Ability to identify changes in lifestyle to reduce recurrence of condition will improve Ability to verbalize feelings will improve Ability to disclose and discuss suicidal ideas Ability to demonstrate self-control will improve Ability to identify and develop effective coping behaviors will improve Ability to maintain clinical measurements within normal limits will improve Compliance with prescribed medications will improve Ability to identify triggers associated with substance abuse/mental health issues will improve  Medication Management: Evaluate patient's response, side effects, and tolerance of medication regimen.  Therapeutic Interventions: 1 to 1 sessions, Unit Group sessions and Medication administration.  Evaluation of  Outcomes: Progressing  Physician Treatment Plan for Secondary Diagnosis: Active Problems:   MDD (major depressive disorder), recurrent severe, without psychosis (HCC)  Long Term Goal(s): Improvement in symptoms so as ready for discharge   Short Term Goals: Ability to identify changes in lifestyle to reduce recurrence of condition will improve Ability to verbalize feelings will improve Ability to disclose and discuss suicidal ideas Ability to demonstrate self-control will improve Ability to identify and develop effective coping behaviors will improve Ability to maintain clinical measurements within normal limits will improve Compliance with prescribed medications will improve Ability to identify triggers associated with substance abuse/mental health issues will improve  Medication Management: Evaluate patient's response, side effects, and tolerance of medication regimen.  Therapeutic Interventions: 1 to 1 sessions, Unit Group sessions and Medication administration.  Evaluation of Outcomes: Progressing   RN Treatment Plan for Primary Diagnosis: <principal problem not specified> Long Term Goal(s): Knowledge of disease and therapeutic regimen to maintain health will improve  Short Term Goals: Ability to remain free from injury will improve, Ability to demonstrate self-control, Ability to participate in decision making will improve, Ability to verbalize feelings will improve, Ability to disclose and discuss suicidal ideas, and Ability to identify and develop effective coping behaviors will improve  Medication Management: RN will administer medications as ordered by provider, will assess and evaluate patient's response and provide education to patient for prescribed medication. RN will report any adverse and/or side effects to prescribing provider.  Therapeutic Interventions: 1 on 1 counseling sessions, Psychoeducation, Medication administration, Evaluate responses to treatment, Monitor  vital signs and CBGs as ordered, Perform/monitor CIWA, COWS, AIMS and Fall Risk screenings as ordered, Perform wound care treatments as ordered.  Evaluation of Outcomes: Progressing   LCSW Treatment Plan for Primary Diagnosis: <principal problem not specified> Long Term Goal(s): Safe transition to appropriate next level of care at discharge, Engage patient in therapeutic group addressing interpersonal concerns.  Short Term Goals: Engage patient in aftercare planning with referrals and resources, Increase social support, Increase emotional regulation, Facilitate acceptance of mental health diagnosis and concerns, Facilitate patient progression through stages of change regarding substance use diagnoses and concerns, Identify triggers associated with mental health/substance abuse issues, and Increase skills for wellness and recovery  Therapeutic Interventions: Assess for all discharge needs, 1 to 1 time with Social worker, Explore available resources and support systems, Assess for adequacy in community support network, Educate family and significant other(s) on suicide prevention, Complete Psychosocial Assessment, Interpersonal group therapy.  Evaluation of Outcomes: Progressing   Progress in Treatment: Attending groups: Yes. Participating in groups: Yes. Taking medication as prescribed: Yes. Toleration medication: Yes. Family/Significant other contact made: Yes, individual(s) contacted:  Brother  Patient understands diagnosis: No. Discussing patient identified problems/goals with staff: Yes. Medical problems stabilized or resolved: Yes. Denies suicidal/homicidal ideation: Yes. Issues/concerns per patient self-inventory: No.   New problem(s) identified: No, Describe:  None   New Short Term/Long Term Goal(s): medication stabilization, elimination of SI thoughts, development of comprehensive mental wellness plan.   Patient Goals:  "To get better"   Discharge Plan or Barriers: Patient  recently admitted. CSW will continue to follow and assess for appropriate referrals and possible discharge planning.   Reason for Continuation of Hospitalization: Depression Hallucinations Medication stabilization Suicidal ideation  Estimated Length of Stay: 3 to 5 days   Attendees: Patient: Charles Orr 02/16/2021   Physician: Landry Mellow, MD 02/16/2021   Nursing:  02/16/2021   RN Care Manager: 02/16/2021   Social Worker: Melba Coon, LCSWA  02/16/2021   Recreational Therapist:  02/16/2021   Other:  02/16/2021   Other:  02/16/2021   Other: 02/16/2021     Scribe for Treatment Team: Aram Beecham, LCSWA 02/16/2021 12:39 PM

## 2021-02-16 NOTE — Progress Notes (Signed)
D: Patient presents with sad affect but is pleasant at time of assessment. Patient reports that his day was okay overall. Patient is positive for passive SI at this time but verbally contracts for safety. Patient denies HI/AVH at this time.  A: Provided positive reinforcement and encouragement.  R: Patient cooperative and receptive to efforts. Patient remains safe on the unit.   02/16/21 2111  Psych Admission Type (Psych Patients Only)  Admission Status Voluntary  Psychosocial Assessment  Patient Complaints Anxiety;Depression  Eye Contact Fair  Facial Expression Pensive  Affect Appropriate to circumstance;Sad;Sullen  Speech Logical/coherent  Interaction Assertive  Motor Activity Slow  Appearance/Hygiene In scrubs  Behavior Characteristics Cooperative;Appropriate to situation  Mood Depressed;Sad  Thought Process  Coherency WDL  Content WDL  Delusions None reported or observed  Perception WDL  Hallucination None reported or observed  Judgment WDL  Confusion None  Danger to Self  Current suicidal ideation? Passive  Self-Injurious Behavior No self-injurious ideation or behavior indicators observed or expressed   Agreement Not to Harm Self Yes  Description of Agreement Verbal Contract  Danger to Others  Danger to Others None reported or observed

## 2021-02-16 NOTE — Progress Notes (Signed)
CBG of 65 at 0551. Notified Melbourne Abts, [place]. Provided pt 8 ounces of orange juice and 1 packet of graham crackers which he has consumed. Pt also complained of weakness. Scheduled doses of insulin on hold this morning per [place] Abts, [place]. Rechecked CBG. It was 114 at 0613. Updated [place] Abts, [place].

## 2021-02-16 NOTE — Progress Notes (Signed)
[facility] MD Progress Note  02/16/2021 5:19 PM Charles Orr  MRN:  [id number]  Subjective: [facility], "I'm still hearing voices, mood is not good. I'm still depressed. The voices are telling me to hurt myself & hurt other people. I want to hurt the individual who stole from me".  Reason for admission: Patient is a 60 year old male who [facility] on 02/13/2021 by emergency medical services secondary to suicidal ideation and hearing voices for 2 days.  He also endorsed confusion. Objective: Charles Orr is seen, chart reviewed. The chart findings discussed with the treatment team. He is lying down in bed, alert, oriented & aware of situation. He is visible on the unit, attending group sessions. He says he is still feeling depressed, hearing voices telling him to kill himself & other people. He says he is feeling homicidal towards individual the individual who stole from him. He denies any plan or intent to hurt himself or other people. He rates his depression #5 & anxiety #4. He is taking & tolerating his treatment regimen, denies any side effects. Labs reviewed, no changes. Vital signs reviewed; B/p -153/85, P - 42 , R - 18 & O2 sat - 100% in room air. Patient is in no apparent distress. Will continue current plan of care as already in progress.  Principal Problem: MDD (major depressive disorder), recurrent severe, without psychosis (HCC)  Diagnosis: Principal Problem:   MDD (major depressive disorder), recurrent severe, without psychosis (HCC)  Total Time spent with patient:  25 minutes  Past Psychiatric History: See H&P  Past Medical History:  Past Medical History:  Diagnosis Date   Angina pectoris (HCC) 07/09/2017   Anxiety    Arthritis    "back" (06/12/2017)   Bipolar disorder, curr episode mixed, severe, with psychotic features (HCC) 10/08/2017   CAP (community acquired pneumonia) 06/10/2017   Chest pain 07/10/2017   Chronic lower back pain     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   Community acquired pneumonia of right lower lobe of lung    Degenerative disc disease, lumbar    Depression    DM (diabetes mellitus) (HCC)    Fibromyalgia    GERD (gastroesophageal reflux disease)    High cholesterol    Hyperglycemia    Hypertension    Localized edema    Myocardial infarction (HCC) 2002   Osteoarthritis    Other schizophrenia (HCC)    Pneumonia 1989   Polysubstance (including opioids) dependence with physiological dependence (HCC) 10/06/2017   Schizoaffective disorder, bipolar type (HCC) 06/16/2017   Schizophrenia (HCC)    Shortness of breath 06/10/2017   Tobacco abuse 07/09/2017   Type II diabetes mellitus (HCC)     Past Surgical History:  Procedure Laterality Date   CARDIAC CATHETERIZATION  12/2006   [name]/notes 01/18/2011   INGUINAL HERNIA REPAIR Right    LEFT HEART CATH AND CORONARY ANGIOGRAPHY N/A 07/11/2017   Procedure: LEFT HEART CATH AND CORONARY ANGIOGRAPHY;  Surgeon: Tonny Bollmanooper, Michael, MD;  [facility];  Service: Cardiovascular;  Laterality: N/A;   PARATHYROIDECTOMY     PERICARDIAL TAP  2000   ?; in [place]/notes 01/18/2011   Family History:  Family History  Problem Relation Age of Onset   Hypertension Mother    Diabetes Mother    Heart disease Father    Hypertension Sister    Diabetes Sister    Hypertension Brother    Diabetes Brother  Hypertension Sister    Diabetes Sister    Hypertension Brother    Diabetes Brother    Family Psychiatric  History: See H&P  Social History:  Social History   Substance and Sexual Activity  Alcohol Use Yes   Comment: 06/12/2017 "stopped ~ 1 yr ago"      Social History   Substance and Sexual Activity  Drug Use Yes   Types: Cocaine   Comment: 06/12/2017 "stopped using cocaine ~ 1 yr ago; used marijuana when I was younger" last cocaine use 9 mths ago per pt on 06/15/17    Social History    Socioeconomic History   Marital status: Divorced    Spouse name: Not on file   Number of children: 1   Years of education: Not on file   Highest education level: GED or equivalent  Occupational History   Not on file  Tobacco Use   Smoking status: Every Day    Packs/day: 0.50    Years: 38.00    Pack years: 19.00    Types: Cigarettes   Smokeless tobacco: Current    Types: Chew  Vaping Use   Vaping Use: Never used  Substance and Sexual Activity   Alcohol use: Yes    Comment: 06/12/2017 "stopped ~ 1 yr ago"    Drug use: Yes    Types: Cocaine    Comment: 06/12/2017 "stopped using cocaine ~ 1 yr ago; used marijuana when I was younger" last cocaine use 9 mths ago per pt on 06/15/17   Sexual activity: Not Currently  Other Topics Concern   Not on file  Social History Narrative   Not on file   Social Determinants of Health   Financial Resource Strain: Not on file  Food Insecurity: Not on file  Transportation Needs: Not on file  Physical Activity: Not on file  Stress: Not on file  Social Connections: Not on file   Additional Social History:   Sleep: Poor  Appetite:  Good  Current Medications: Current Facility-Administered Medications  Medication Dose Route Frequency Provider Last Rate Last Admin   acetaminophen (TYLENOL) tablet 650 mg  650 mg Oral Q6H PRN Rankin, Shuvon B, NP       alum & mag hydroxide-simeth (MAALOX/MYLANTA) 200-200-20 MG/5ML suspension 30 mL  30 mL Oral Q4H PRN Rankin, Shuvon B, NP       atorvastatin (LIPITOR) tablet 80 mg  80 mg Oral Daily Laveda Abbe, NP   80 mg at 02/16/21 0741   busPIRone (BUSPAR) tablet 10 mg  10 mg Oral BID Laveda Abbe, NP   10 mg at 02/16/21 0741   cloNIDine (CATAPRES) tablet 0.1 mg  0.1 mg Oral BID Laveda Abbe, NP   0.1 mg at 02/16/21 1610   gabapentin (NEURONTIN) capsule 300 mg  300 mg Oral TID Laveda Abbe, NP   300 mg at 02/16/21 1144   insulin aspart (novoLOG) injection 0-15 Units  0-15  Units Subcutaneous TID WC Antonieta Pert, MD   11 Units at 02/16/21 1151   insulin aspart (novoLOG) injection 15 Units  15 Units Subcutaneous TID WC Antonieta Pert, MD   15 Units at 02/16/21 1152   insulin glargine (LANTUS) injection 22 Units  22 Units Subcutaneous q AM Laveda Abbe, NP   22 Units at 02/15/21 0626   insulin glargine (LANTUS) injection 38 Units  38 Units Subcutaneous QHS Laveda Abbe, NP   38 Units at 02/15/21 2104   losartan (COZAAR) tablet  25 mg  25 mg Oral Daily Laveda Abbe, NP   25 mg at 02/16/21 0741   magnesium hydroxide (MILK OF MAGNESIA) suspension 30 mL  30 mL Oral Daily PRN Rankin, Shuvon B, NP       metFORMIN (GLUCOPHAGE) tablet 1,000 mg  1,000 mg Oral BID WC Laveda Abbe, NP   1,000 mg at 02/16/21 0740   risperiDONE (RISPERDAL M-TABS) disintegrating tablet 1 mg  1 mg Oral Daily Antonieta Pert, MD   1 mg at 02/16/21 0741   risperiDONE (RISPERDAL M-TABS) disintegrating tablet 2 mg  2 mg Oral QHS Antonieta Pert, MD   2 mg at 02/15/21 2106   sertraline (ZOLOFT) tablet 100 mg  100 mg Oral Daily Antonieta Pert, MD   100 mg at 02/16/21 0741   traZODone (DESYREL) tablet 100 mg  100 mg Oral QHS Laveda Abbe, NP   100 mg at 02/15/21 2106    Lab Results:  Results for orders placed or performed during the hospital encounter of 02/14/21 (from the past 48 hour(s))  Resp Panel by RT-PCR (Flu A&B, Covid) Nasopharyngeal Swab     Status: None   Collection Time: 02/14/21  7:08 PM   Specimen: Nasopharyngeal Swab; Nasopharyngeal(NP) swabs in vial transport medium  Result Value Ref Range   SARS Coronavirus 2 by RT PCR NEGATIVE NEGATIVE    Comment: (NOTE) SARS-CoV-2 target nucleic acids are NOT DETECTED.  The SARS-CoV-2 RNA is generally detectable in upper respiratory specimens during the acute phase of infection. The lowest concentration of SARS-CoV-2 viral copies this assay can detect is 138 copies/mL. A negative  result does not preclude SARS-Cov-2 infection and should not be used as the sole basis for treatment or other patient management decisions. A negative result may occur with  improper specimen collection/handling, submission of specimen other than nasopharyngeal swab, presence of viral mutation(s) within the areas targeted by this assay, and inadequate number of viral copies(<138 copies/mL). A negative result must be combined with clinical observations, patient history, and epidemiological information. The expected result is Negative.  Fact Sheet for Patients:  BloggerCourse.com  Fact Sheet for Healthcare Providers:  SeriousBroker.it  This test is no t yet approved or cleared by the [place] FDA and  has been authorized for detection and/or diagnosis of SARS-CoV-2 by FDA under an Emergency Use Authorization (EUA). This EUA will remain  in effect (meaning this test can be used) for the duration of the COVID-19 declaration under Section 564(b)(1) of the Act, 21 U.S.C.section 360bbb-3(b)(1), unless the authorization is terminated  or revoked sooner.       Influenza A by PCR NEGATIVE NEGATIVE   Influenza B by PCR NEGATIVE NEGATIVE    Comment: (NOTE) The Xpert Xpress SARS-CoV-2/FLU/RSV plus assay is intended as an aid in the diagnosis of influenza from Nasopharyngeal swab specimens and should not be used as a sole basis for treatment. Nasal washings and aspirates are unacceptable for Xpert Xpress SARS-CoV-2/FLU/RSV testing.  Fact Sheet for Patients: BloggerCourse.com  Fact Sheet for Healthcare Providers: SeriousBroker.it  This test is not yet approved or cleared by the [place] FDA and has been authorized for detection and/or diagnosis of SARS-CoV-2 by FDA under an Emergency Use Authorization (EUA). This EUA will remain in effect (meaning this test can be used) for the  duration of the COVID-19 declaration under Section 564(b)(1) of the Act, 21 U.S.C. section 360bbb-3(b)(1), unless the authorization is terminated or revoked.  Performed at [facility],  [address] [place], [place] [address]   Glucose, capillary     Status: Abnormal   Collection Time: 02/14/21  9:23 PM  Result Value Ref Range   Glucose-Capillary 225 (H) 70 - 99 mg/dL    Comment: Glucose reference range applies only to samples taken after fasting for at least 8 hours.   Comment 1 Notify RN   Glucose, capillary     Status: Abnormal   Collection Time: 02/15/21  5:45 AM  Result Value Ref Range   Glucose-Capillary 180 (H) 70 - 99 mg/dL    Comment: Glucose reference range applies only to samples taken after fasting for at least 8 hours.  TSH     Status: None   Collection Time: 02/15/21  6:07 AM  Result Value Ref Range   TSH 1.905 0.350 - 4.500 uIU/mL    Comment: Performed by a 3rd Generation assay with a functional sensitivity of <=0.01 uIU/mL. Performed at [facility], [address], [place], [place] [address]   Lipid panel     Status: Abnormal   Collection Time: 02/15/21  6:07 AM  Result Value Ref Range   Cholesterol 125 0 - 200 mg/dL   Triglycerides 64 <413 mg/dL   HDL 29 (L) >24 mg/dL   Total CHOL/HDL Ratio 4.3 RATIO   VLDL 13 0 - 40 mg/dL   LDL Cholesterol 83 0 - 99 mg/dL    Comment:        Total Cholesterol/HDL:CHD Risk Coronary Heart Disease Risk Table                     Men   Women  1/2 Average Risk   3.4   3.3  Average Risk       5.0   4.4  2 X Average Risk   9.6   7.1  3 X Average Risk  23.4   11.0        Use the calculated Patient Ratio above and the CHD Risk Table to determine the patient's CHD Risk.        ATP III CLASSIFICATION (LDL):  <100     mg/dL   Optimal  401-027  mg/dL   Near or Above                    Optimal  130-159  mg/dL   Borderline  253-664  mg/dL   High  >403     mg/dL   Very High Performed at  [facility], [address], [place], [place] [address]   Hemoglobin A1c     Status: Abnormal   Collection Time: 02/15/21  6:07 AM  Result Value Ref Range   Hgb A1c MFr Bld 8.6 (H) 4.8 - 5.6 %    Comment: (NOTE)         Prediabetes: 5.7 - 6.4         Diabetes: >6.4         Glycemic control for adults with diabetes: <7.0    Mean Plasma Glucose 200 mg/dL    Comment: (NOTE) Performed At: [facility] [address] [place], [place] [id number] Jolene Schimke MD PP:[id number]   Glucose, capillary     Status: None   Collection Time: 02/15/21 11:57 AM  Result Value Ref Range   Glucose-Capillary 92 70 - 99 mg/dL    Comment: Glucose reference range applies only to samples taken after fasting for at least 8 hours.  Glucose, capillary  Status: Abnormal   Collection Time: 02/15/21  5:07 PM  Result Value Ref Range   Glucose-Capillary 186 (H) 70 - 99 mg/dL    Comment: Glucose reference range applies only to samples taken after fasting for at least 8 hours.  Glucose, capillary     Status: None   Collection Time: 02/15/21  8:27 PM  Result Value Ref Range   Glucose-Capillary 86 70 - 99 mg/dL    Comment: Glucose reference range applies only to samples taken after fasting for at least 8 hours.   Comment 1 Notify RN    Comment 2 Document in Chart   Glucose, capillary     Status: Abnormal   Collection Time: 02/16/21  5:51 AM  Result Value Ref Range   Glucose-Capillary 65 (L) 70 - 99 mg/dL    Comment: Glucose reference range applies only to samples taken after fasting for at least 8 hours.  Glucose, capillary     Status: Abnormal   Collection Time: 02/16/21  6:13 AM  Result Value Ref Range   Glucose-Capillary 114 (H) 70 - 99 mg/dL    Comment: Glucose reference range applies only to samples taken after fasting for at least 8 hours.  Glucose, capillary     Status: Abnormal   Collection Time: 02/16/21  7:38 AM  Result Value Ref Range   Glucose-Capillary 262 (H) 70  - 99 mg/dL    Comment: Glucose reference range applies only to samples taken after fasting for at least 8 hours.  Glucose, capillary     Status: Abnormal   Collection Time: 02/16/21 11:28 AM  Result Value Ref Range   Glucose-Capillary 306 (H) 70 - 99 mg/dL    Comment: Glucose reference range applies only to samples taken after fasting for at least 8 hours.  Glucose, capillary     Status: Abnormal   Collection Time: 02/16/21  4:47 PM  Result Value Ref Range   Glucose-Capillary 217 (H) 70 - 99 mg/dL    Comment: Glucose reference range applies only to samples taken after fasting for at least 8 hours.    Blood Alcohol level:  Lab Results  Component Value Date   ETH <10 02/13/2021   ETH <10 10/07/2017    Metabolic Disorder Labs: Lab Results  Component Value Date   HGBA1C 8.6 (H) 02/15/2021   MPG 200 02/15/2021   MPG 240.3 07/09/2017   No results found for: PROLACTIN Lab Results  Component Value Date   CHOL 125 02/15/2021   TRIG 64 02/15/2021   HDL 29 (L) 02/15/2021   CHOLHDL 4.3 02/15/2021   VLDL 13 02/15/2021   LDLCALC 83 02/15/2021   LDLCALC 43 06/10/2017   Physical Findings: AIMS: Facial and Oral Movements Muscles of Facial Expression: None, normal Lips and Perioral Area: None, normal Jaw: None, normal Tongue: None, normal,Extremity Movements Upper (arms, wrists, hands, fingers): None, normal Lower (legs, knees, ankles, toes): None, normal, Trunk Movements Neck, shoulders, hips: None, normal, Overall Severity Severity of abnormal movements (highest score from questions above): None, normal Incapacitation due to abnormal movements: None, normal Patient's awareness of abnormal movements (rate only patient's report): No Awareness, Dental Status Current problems with teeth and/or dentures?: No Does patient usually wear dentures?: No  CIWA:    COWS:     Musculoskeletal: Strength & Muscle Tone: within normal limits Gait & Station: normal Patient leans:  N/A  Psychiatric Specialty Exam:  Presentation  General Appearance: Disheveled  Eye Contact:Poor  Speech:Normal Rate  Speech Volume:Normal  Handedness:Right  Mood and Affect  Mood:Depressed; Dysphoric  Affect:Flat  Thought Process  Thought Processes:Goal Directed  Descriptions of Associations:Circumstantial  Orientation:Full (Time, Place and Person)  Thought Content:Delusions; Paranoid Ideation  History of Schizophrenia/Schizoaffective disorder:Yes  Duration of Psychotic Symptoms:Greater than six months  Hallucinations:Hallucinations: Auditory  Ideas of Reference:Delusions; Paranoia  Suicidal Thoughts:Suicidal Thoughts: Yes, Passive SI Passive Intent and/or Plan: Without Intent  Homicidal Thoughts:Homicidal Thoughts: No   Sensorium  Memory:Immediate Poor; Recent Poor; Remote Poor  Judgment:Intact  Insight:Fair   Executive Functions  Concentration:Fair  Attention Span:Fair  Recall:Poor  Fund of Knowledge:Fair  Language:Fair   Psychomotor Activity  Psychomotor Activity:Psychomotor Activity: Decreased   Assets  Assets:Desire for Improvement; Resilience; Housing   Sleep  Sleep:Sleep: Fair Number of Hours of Sleep: 5.5    Physical Exam: Physical Exam Vitals and nursing note reviewed.  HENT:     Head: Normocephalic.     Nose: Nose normal.     Mouth/Throat:     Pharynx: Oropharynx is clear.  Eyes:     Pupils: Pupils are equal, round, and reactive to light.  Cardiovascular:     Rate and Rhythm: Normal rate.     Pulses: Normal pulses.  Pulmonary:     Effort: Pulmonary effort is normal.  Genitourinary:    Comments: Deferred Musculoskeletal:        General: Normal range of motion.     Cervical back: Normal range of motion.  Skin:    General: Skin is warm and dry.  Neurological:     General: No focal deficit present.     Mental Status: He is alert and oriented to person, place, and time.   Review of Systems  Constitutional:  Negative.   HENT: Negative.    Eyes: Negative.   Respiratory: Negative.    Cardiovascular: Negative.   Gastrointestinal: Negative.   Genitourinary: Negative.   Musculoskeletal: Negative.   Skin: Negative.   Neurological: Negative.   Endo/Heme/Allergies: Negative.   Psychiatric/Behavioral:  Positive for depression, hallucinations (Reports AH), substance abuse and suicidal ideas. The patient has insomnia. The patient is not nervous/anxious.   Blood pressure (!) 153/85, pulse (!) 45, temperature 97.7 F (36.5 C), temperature source Oral, resp. rate 18, height 6\' 2"  (1.88 m), weight 123.8 kg, SpO2 100 %. Body mass index is 35.05 kg/m.  Treatment Plan Summary: Daily contact with patient to assess and evaluate symptoms and progress in treatment and Medication management.   Continue inpatient hospitalization.  Will continue today 02/16/2021 plan as below except where it is noted.   Mood control. Continue Risperdal M-tab 1 mg po daily. Continue Risperdal M-tab 2 mg po Q hs.  Depression.  Continue Sertraline 100 mg po daily.   Anxiety. Continue Buspar 10 mg po bid.   Insomnia.  Continue Trazodone 100 mg po q hs.   Other medical issues: continue;  Lipitor 80 mg po Q bedtime for hyperlipidemia.  Clonidine 0.1 mg po bid for HTN.  Gabapentin 300 mg po tid for neuropathic pain. Sliding scale insulin tid with meals per BS result.  novoLog insulin 15 units subq tid with meals for DM.  Lantus insulin 22 units 38 units subQ Q am (07:00 am).  Lantus insulin 38 units subq q hs for DM. Losartan 25 mg po daily for HTN.  Metformin 1000 mg po bid for DM.  Continue the other prn medications as recommended for other medical complaints/symptoms. Encourage group participation  02/18/2021, NP, pmhnp, fnp-bc 02/16/2021, 5:19 PM

## 2021-02-17 LAB — GLUCOSE, CAPILLARY
Glucose-Capillary: 100 mg/dL — ABNORMAL HIGH (ref 70–99)
Glucose-Capillary: 130 mg/dL — ABNORMAL HIGH (ref 70–99)
Glucose-Capillary: 207 mg/dL — ABNORMAL HIGH (ref 70–99)
Glucose-Capillary: 251 mg/dL — ABNORMAL HIGH (ref 70–99)

## 2021-02-17 NOTE — Progress Notes (Signed)
[facility] Group Notes:  (Nursing/MHT/Case Management/Adjunct)  Date:  02/17/2021  Time:  2015  Type of Therapy:   wrap up group  Participation Level:  Active  Participation Quality:  Appropriate, Attentive, and Sharing  Affect:  Depressed and Flat  Cognitive:  Alert  Insight:  Improving  Engagement in Group:  Engaged  Modes of Intervention:  Clarification, Education, and Support  Summary of Progress/Problems: Positive thinking and positive change were discussed.   Charles Orr 02/17/2021, 10:37 PM
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
## 2021-02-17 NOTE — Progress Notes (Addendum)
BS at 1658 was 207.

## 2021-02-17 NOTE — Progress Notes (Signed)
Progress Note:   02/17/21 0730  Psych Admission Type (Psych Patients Only)  Admission Status Voluntary  Psychosocial Assessment  Patient Complaints Depression  Eye Contact Fair  Facial Expression Pensive  Affect Appropriate to circumstance;Sad;Sullen  Speech Logical/coherent  Interaction Assertive  Motor Activity Slow  Appearance/Hygiene In scrubs  Behavior Characteristics Cooperative;Anxious  Mood Depressed;Sad  Thought Process  Coherency WDL  Content WDL  Delusions None reported or observed  Perception WDL  Hallucination None reported or observed  Judgment WDL  Confusion None  Danger to Self  Current suicidal ideation? Passive  Self-Injurious Behavior No self-injurious ideation or behavior indicators observed or expressed   Agreement Not to Harm Self Yes  Description of Agreement Verbal Contract  Danger to Others  Danger to Others None reported or observed

## 2021-02-17 NOTE — [facility] Group Notes (Signed)
[facility] Group Notes:  (Nursing/MHT/Case Management/Adjunct)  Date:  02/17/2021  Time:  10:39 AM  Type of Therapy:   Goals group  Participation Level:  Active  Participation Quality:  Appropriate, Attentive, and Sharing  Affect:  Depressed and Flat  Cognitive:  Alert and Appropriate  Insight:  Improving  Engagement in Group:  Engaged  Modes of Intervention:  Discussion and Education  Summary of Progress/Problems:  He reported his goal for today is "to get better."  His sleep was fair with sleep medications.  Charles Orr 02/17/2021, 10:39 AM

## 2021-02-18 LAB — GLUCOSE, CAPILLARY
Glucose-Capillary: 79 mg/dL (ref 70–99)
Glucose-Capillary: 147 mg/dL — ABNORMAL HIGH (ref 70–99)
Glucose-Capillary: 109 mg/dL — ABNORMAL HIGH (ref 70–99)
Glucose-Capillary: 244 mg/dL — ABNORMAL HIGH (ref 70–99)

## 2021-02-18 NOTE — Progress Notes (Signed)
D: Patient presents with sad affect but is pleasant at time of assessment. Patient is positive for passive SI at this time but verbally contracts for safety. Patient reports that his overall mood is improving but he still feels depressed. Patient denies HI/AVH at this time.  A: Provided positive reinforcement and encouragement.  R: Patient cooperative and receptive to efforts. Patient remains safe on the unit.   02/18/21 2107  Psych Admission Type (Psych Patients Only)  Admission Status Voluntary  Psychosocial Assessment  Patient Complaints Anxiety;Depression  Eye Contact Fair  Facial Expression Pensive;Sad  Affect Appropriate to circumstance;Sad  Speech Logical/coherent  Interaction Assertive  Motor Activity Slow  Appearance/Hygiene In scrubs  Behavior Characteristics Appropriate to situation;Cooperative  Mood Depressed;Sad  Thought Process  Coherency WDL  Content WDL  Delusions None reported or observed  Perception WDL  Hallucination None reported or observed  Judgment WDL  Confusion None  Danger to Self  Current suicidal ideation? Passive  Self-Injurious Behavior No self-injurious ideation or behavior indicators observed or expressed   Agreement Not to Harm Self Yes  Description of Agreement verbal contract  Danger to Others  Danger to Others None reported or observed

## 2021-02-18 NOTE — [facility] Group Notes (Signed)
[facility] Group Notes:  (Nursing/MHT/Case Management/Adjunct)  Date:  02/18/2021  Time:  11:44 AM  Type of Therapy:   Goals group  Participation Level:  Active  Participation Quality:  Attentive and Monopolizing  Affect:  Depressed  Cognitive:  Alert and Appropriate  Insight:  Improving  Engagement in Group:  Engaged and Monopolizing  Modes of Intervention:  Discussion and Education  Summary of Progress/Problems:  Charles Orr reported his goal for today is "getting better."  He was happy that he slept one hour longer than he slept the night before.  [name] [name] 02/18/2021, 11:44 AM

## 2021-02-18 NOTE — Progress Notes (Signed)
Inpatient Diabetes Program Recommendations  AACE/ADA: New Consensus Statement on Inpatient Glycemic Control (2015)  Target Ranges:  Prepandial:   less than 140 mg/dL      Peak postprandial:   less than 180 mg/dL (1-2 hours)      Critically ill patients:  140 - 180 mg/dL   Lab Results  Component Value Date   GLUCAP 79 02/18/2021   HGBA1C 8.6 (H) 02/15/2021    Review of Glycemic Control  Diabetes history: DM2 Outpatient Diabetes medications: Lantus 22 in am and 38 QHS, Januvia 50 mg QD, Novolog 20 units TID with meals Current orders for Inpatient glycemic control: Lantus 22 units in am and 38 units QHS, Novolog 15 units TID with meals + 0-15 units TID  HgbA1C - 8.6% CBG this am - 79  Inpatient Diabetes Program Recommendations:    Consider reducing pm dose of Lantus to 36 units QHS to hopefully prevent hypoglycemia in am.  Will continue to follow.  Thank you. Ailene Ards, RD, LDN, CDE Inpatient Diabetes Coordinator [phone]

## 2021-02-18 NOTE — Progress Notes (Signed)
[facility] MD Progress Note  02/18/2021 2:29 PM Charles Orr  MRN:  [id number]  Subjective: Charles Orr reports, "I'm feeling a little better today. I know that my feeling a lot better days are coming. I slept more last night than I slept the night before. The Risperdal is doing me a lot of good right now. The voices are still reminding me that I'm worthless, but they becoming less & less".  Reason for admission: Patient is a 60 year old male who was transported to the [facility] emergency department on 02/13/2021 by emergency medical services secondary to suicidal ideation and hearing voices for 2 days.  He also endorsed confusion. Objective: Charles Orr is seen, chart reviewed. The chart findings discussed with the treatment team. He is out of his room, sitting on a chair on the corner of the of [address]. He presents alert, oriented & aware of situation. He is visible on the unit, attending group sessions. He says he is feeling a little better today, but hopeful that his better days are coming. He says he is still hearing voices & the voices has lessened. He reports passive SI, denies any plans or intent to hurt himself. Denies any HI today. He rates his depression #4 & anxiety #2. He is taking & tolerating his treatment regimen. Labs reviewed, no changes. The vital signs reviewed; all stable. Patient is in no apparent distress. Will continue current plan of care as already in progress. He says he slept well last night. Will continue current plan of care as already in progress.  Principal Problem: MDD (major depressive disorder), recurrent severe, without psychosis (HCC)  Diagnosis: Principal Problem:   MDD (major depressive disorder), recurrent severe, without psychosis (HCC)  Total Time spent with patient: 15 minutes  Past Psychiatric History: See H&P  Past Medical History:  Past Medical History:  Diagnosis Date   Angina pectoris (HCC) 07/09/2017   Anxiety    Arthritis    "back" (06/12/2017)    Bipolar disorder, curr episode mixed, severe, with psychotic features (HCC) 10/08/2017   CAP (community acquired pneumonia) 06/10/2017   Chest pain 07/10/2017   Chronic lower back pain    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   Community acquired pneumonia of right lower lobe of lung    Degenerative disc disease, lumbar    Depression    DM (diabetes mellitus) (HCC)    Fibromyalgia    GERD (gastroesophageal reflux disease)    High cholesterol    Hyperglycemia    Hypertension    Localized edema    Myocardial infarction (HCC) 2002   Osteoarthritis    Other schizophrenia (HCC)    Pneumonia 1989   Polysubstance (including opioids) dependence with physiological dependence (HCC) 10/06/2017   Schizoaffective disorder, bipolar type (HCC) 06/16/2017   Schizophrenia (HCC)    Shortness of breath 06/10/2017   Tobacco abuse 07/09/2017   Type II diabetes mellitus (HCC)     Past Surgical History:  Procedure Laterality Date   CARDIAC CATHETERIZATION  12/2006   [name] 01/18/2011   INGUINAL HERNIA REPAIR Right    LEFT HEART CATH AND CORONARY ANGIOGRAPHY N/A 07/11/2017   Procedure: LEFT HEART CATH AND CORONARY ANGIOGRAPHY;  Surgeon: Tonny Bollman, MD;  Location: [facility] INVASIVE CV LAB;  Service: Cardiovascular;  Laterality: N/A;   PARATHYROIDECTOMY     PERICARDIAL TAP  2000   ?; in [place]/notes 01/18/2011   Family History:  Family History  Problem Relation Age of Onset   Hypertension Mother  Diabetes Mother    Heart disease Father    Hypertension Sister    Diabetes Sister    Hypertension Brother    Diabetes Brother    Hypertension Sister    Diabetes Sister    Hypertension Brother    Diabetes Brother    Family Psychiatric  History: See H&P  Social History:  Social History   Substance and Sexual Activity  Alcohol Use Yes   Comment: 06/12/2017 "stopped ~ 1 yr ago"      Social History   Substance and Sexual Activity  Drug Use Yes    Types: Cocaine   Comment: 06/12/2017 "stopped using cocaine ~ 1 yr ago; used marijuana when I was younger" last cocaine use 9 mths ago per pt on 06/15/17    Social History   Socioeconomic History   Marital status: Divorced    Spouse name: Not on file   Number of children: 1   Years of education: Not on file   Highest education level: GED or equivalent  Occupational History   Not on file  Tobacco Use   Smoking status: Every Day    Packs/day: 0.50    Years: 38.00    Pack years: 19.00    Types: Cigarettes   Smokeless tobacco: Current    Types: Chew  Vaping Use   Vaping Use: Never used  Substance and Sexual Activity   Alcohol use: Yes    Comment: 06/12/2017 "stopped ~ 1 yr ago"    Drug use: Yes    Types: Cocaine    Comment: 06/12/2017 "stopped using cocaine ~ 1 yr ago; used marijuana when I was younger" last cocaine use 9 mths ago per pt on 06/15/17   Sexual activity: Not Currently  Other Topics Concern   Not on file  Social History Narrative   Not on file   Social Determinants of Health   Financial Resource Strain: Not on file  Food Insecurity: Not on file  Transportation Needs: Not on file  Physical Activity: Not on file  Stress: Not on file  Social Connections: Not on file   Additional Social History:   Sleep: Good  Appetite:  Good  Current Medications: Current Facility-Administered Medications  Medication Dose Route Frequency Provider Last Rate Last Admin   acetaminophen (TYLENOL) tablet 650 mg  650 mg Oral Q6H PRN Rankin, Shuvon B, NP       alum & mag hydroxide-simeth (MAALOX/MYLANTA) 200-200-20 MG/5ML suspension 30 mL  30 mL Oral Q4H PRN Rankin, Shuvon B, NP       atorvastatin (LIPITOR) tablet 80 mg  80 mg Oral Daily Laveda Abbe, NP   80 mg at 02/18/21 0754   busPIRone (BUSPAR) tablet 10 mg  10 mg Oral BID Laveda Abbe, NP   10 mg at 02/18/21 0754   cloNIDine (CATAPRES) tablet 0.1 mg  0.1 mg Oral BID Laveda Abbe, NP   0.1 mg at  02/18/21 0754   gabapentin (NEURONTIN) capsule 300 mg  300 mg Oral TID Laveda Abbe, NP   300 mg at 02/18/21 1201   insulin aspart (novoLOG) injection 0-15 Units  0-15 Units Subcutaneous TID WC Antonieta Pert, MD   2 Units at 02/18/21 1201   insulin aspart (novoLOG) injection 15 Units  15 Units Subcutaneous TID WC Antonieta Pert, MD   15 Units at 02/18/21 1201   insulin glargine (LANTUS) injection 22 Units  22 Units Subcutaneous q AM Laveda Abbe, NP   22 Units at  02/18/21 0755   insulin glargine (LANTUS) injection 38 Units  38 Units Subcutaneous QHS Laveda AbbeParks, Laurie Britton, NP   38 Units at 02/17/21 2110   losartan (COZAAR) tablet 25 mg  25 mg Oral Daily Laveda AbbeParks, Laurie Britton, NP   25 mg at 02/18/21 0754   magnesium hydroxide (MILK OF MAGNESIA) suspension 30 mL  30 mL Oral Daily PRN Rankin, Shuvon B, NP       metFORMIN (GLUCOPHAGE) tablet 1,000 mg  1,000 mg Oral BID WC Laveda AbbeParks, Laurie Britton, NP   1,000 mg at 02/18/21 0754   risperiDONE (RISPERDAL M-TABS) disintegrating tablet 1 mg  1 mg Oral Daily Antonieta Pertlary, Greg Lawson, MD   1 mg at 02/18/21 0754   risperiDONE (RISPERDAL M-TABS) disintegrating tablet 2 mg  2 mg Oral QHS Antonieta Pertlary, Greg Lawson, MD   2 mg at 02/17/21 2109   sertraline (ZOLOFT) tablet 100 mg  100 mg Oral Daily Antonieta Pertlary, Greg Lawson, MD   100 mg at 02/18/21 0754   traZODone (DESYREL) tablet 100 mg  100 mg Oral QHS Laveda AbbeParks, Laurie Britton, NP   100 mg at 02/17/21 2109    Lab Results:  Results for orders placed or performed during the hospital encounter of 02/14/21 (from the past 48 hour(s))  Glucose, capillary     Status: Abnormal   Collection Time: 02/16/21  4:47 PM  Result Value Ref Range   Glucose-Capillary 217 (H) 70 - 99 mg/dL    Comment: Glucose reference range applies only to samples taken after fasting for at least 8 hours.  Glucose, capillary     Status: Abnormal   Collection Time: 02/16/21  8:41 PM  Result Value Ref Range   Glucose-Capillary 203 (H) 70 - 99  mg/dL    Comment: Glucose reference range applies only to samples taken after fasting for at least 8 hours.  Glucose, capillary     Status: Abnormal   Collection Time: 02/17/21  6:08 AM  Result Value Ref Range   Glucose-Capillary 100 (H) 70 - 99 mg/dL    Comment: Glucose reference range applies only to samples taken after fasting for at least 8 hours.  Glucose, capillary     Status: Abnormal   Collection Time: 02/17/21 11:33 AM  Result Value Ref Range   Glucose-Capillary 130 (H) 70 - 99 mg/dL    Comment: Glucose reference range applies only to samples taken after fasting for at least 8 hours.  Glucose, capillary     Status: Abnormal   Collection Time: 02/17/21  4:54 PM  Result Value Ref Range   Glucose-Capillary 207 (H) 70 - 99 mg/dL    Comment: Glucose reference range applies only to samples taken after fasting for at least 8 hours.  Glucose, capillary     Status: Abnormal   Collection Time: 02/17/21  8:36 PM  Result Value Ref Range   Glucose-Capillary 251 (H) 70 - 99 mg/dL    Comment: Glucose reference range applies only to samples taken after fasting for at least 8 hours.  Glucose, capillary     Status: None   Collection Time: 02/18/21  6:03 AM  Result Value Ref Range   Glucose-Capillary 79 70 - 99 mg/dL    Comment: Glucose reference range applies only to samples taken after fasting for at least 8 hours.  Glucose, capillary     Status: Abnormal   Collection Time: 02/18/21 11:42 AM  Result Value Ref Range   Glucose-Capillary 147 (H) 70 - 99 mg/dL    Comment: Glucose  reference range applies only to samples taken after fasting for at least 8 hours.    Blood Alcohol level:  Lab Results  Component Value Date   ETH <10 02/13/2021   ETH <10 10/07/2017    Metabolic Disorder Labs: Lab Results  Component Value Date   HGBA1C 8.6 (H) 02/15/2021   MPG 200 02/15/2021   MPG 240.3 07/09/2017   No results found for: PROLACTIN Lab Results  Component Value Date   CHOL 125  02/15/2021   TRIG 64 02/15/2021   HDL 29 (L) 02/15/2021   CHOLHDL 4.3 02/15/2021   VLDL 13 02/15/2021   LDLCALC 83 02/15/2021   LDLCALC 43 06/10/2017   Physical Findings: AIMS: Facial and Oral Movements Muscles of Facial Expression: None, normal Lips and Perioral Area: None, normal Jaw: None, normal Tongue: None, normal,Extremity Movements Upper (arms, wrists, hands, fingers): None, normal Lower (legs, knees, ankles, toes): None, normal, Trunk Movements Neck, shoulders, hips: None, normal, Overall Severity Severity of abnormal movements (highest score from questions above): None, normal Incapacitation due to abnormal movements: None, normal Patient's awareness of abnormal movements (rate only patient's report): No Awareness, Dental Status Current problems with teeth and/or dentures?: No Does patient usually wear dentures?: No  CIWA:    COWS:     Musculoskeletal: Strength & Muscle Tone: within normal limits Gait & Station: normal Patient leans: N/A  Psychiatric Specialty Exam:  Presentation  General Appearance: Fairly Groomed  Eye Contact:Good  Speech:Normal Rate; Clear and Coherent  Speech Volume:Normal  Handedness:Right  Mood and Affect  Mood:Depressed; Worthless  Affect:Appropriate; Non-Congruent  Thought Process  Thought Processes:Coherent; Linear; Goal Directed  Descriptions of Associations:Intact  Orientation:Full (Time, Place and Person)  Thought Content:Logical  History of Schizophrenia/Schizoaffective disorder:Yes  Duration of Psychotic Symptoms:Greater than six months  Hallucinations: Reports AH telling him he is worthless.  Ideas of Reference:Paranoia  Suicidal Thoughts: Reports passive SI, denies any plans or intent.  Homicidal Thoughts: Denies  Sensorium  Memory:Immediate Good; Recent Good; Remote Good  Judgment:Intact  Insight:Fair  Executive Functions  Concentration:Good  Attention Span:Good  Recall:Good  Fund of  Knowledge:Fair  Language:Good  Psychomotor Activity  Psychomotor Activity: Normal  Assets  Assets:Desire for Improvement; Manufacturing systems engineer; Housing; Resilience  Sleep  Sleep: 6.75    Physical Exam: Physical Exam Vitals and nursing note reviewed.  HENT:     Head: Normocephalic.     Nose: Nose normal.     Mouth/Throat:     Pharynx: Oropharynx is clear.  Eyes:     Pupils: Pupils are equal, round, and reactive to light.  Cardiovascular:     Rate and Rhythm: Normal rate.     Pulses: Normal pulses.  Pulmonary:     Effort: Pulmonary effort is normal.  Genitourinary:    Comments: Deferred Musculoskeletal:        General: Normal range of motion.     Cervical back: Normal range of motion.  Skin:    General: Skin is warm and dry.  Neurological:     General: No focal deficit present.     Mental Status: He is alert and oriented to person, place, and time.   Review of Systems  Constitutional: Negative.   HENT: Negative.    Eyes: Negative.   Respiratory: Negative.    Cardiovascular: Negative.   Gastrointestinal: Negative.   Genitourinary: Negative.   Musculoskeletal: Negative.   Skin: Negative.   Neurological: Negative.   Endo/Heme/Allergies: Negative.   Psychiatric/Behavioral:  Positive for depression, hallucinations (Reports AH), substance abuse and  suicidal ideas. The patient has insomnia. The patient is not nervous/anxious.   Blood pressure 140/77, pulse 84, temperature 97.9 F (36.6 C), temperature source Oral, resp. rate 18, height 6\' 2"  (1.88 m), weight 123.8 kg, SpO2 100 %. Body mass index is 35.05 kg/m.  Treatment Plan Summary: Daily contact with patient to assess and evaluate symptoms and progress in treatment and Medication management.   Continue inpatient hospitalization.  Will continue today 02/18/2021 plan as below except where it is noted.   Mood control. Continue Risperdal M-tab 1 mg po daily. Continue Risperdal M-tab 2 mg po Q hs.  Depression.   Continue Sertraline 100 mg po daily.   Anxiety. Continue Buspar 10 mg po bid.   Insomnia.  Continue Trazodone 100 mg po q hs.   Other medical issues: continue;  Lipitor 80 mg po Q bedtime for hyperlipidemia.  Clonidine 0.1 mg po bid for HTN.  Gabapentin 300 mg po tid for neuropathic pain. Sliding scale insulin tid with meals per BS result.  novoLog insulin 15 units subq tid with meals for DM.  Lantus insulin 22 units 38 units subQ Q am (07:00 am).  Lantus insulin 38 units subq q hs for DM. Losartan 25 mg po daily for HTN.  Metformin 1000 mg po bid for DM.  Continue the other prn medications as recommended for other medical complaints/symptoms. Encourage group participation  02/20/2021, NP, pmhnp, fnp-bc 02/18/2021, 2:29 PM

## 2021-02-18 NOTE — [facility] Group Notes (Signed)
[facility] Group Notes:  (Nursing/MHT/Case Management/Adjunct)   Date:  02/18/2021  Time:  12:58 PM   Type of Therapy:  Group Therapy   Participation Level:  Active   Participation Quality:  Appropriate, Attentive, and Sharing   Affect:  Anxious   Cognitive:  Alert and Appropriate   Insight:  Improving   Engagement in Group:  Engaged and Supportive   Modes of Intervention:  Activity and Rapport Building   Summary of Progress/Problems:  Charles Orr was able to participate with the group activity.  He shared two true things about himself and one that is not true.  The group was able to pick what was the untrue statements.   [name] [name] 02/18/2021, 12:58 PM

## 2021-02-18 NOTE — Progress Notes (Signed)
   02/18/21 0745  Psych Admission Type (Psych Patients Only)  Admission Status Voluntary  Psychosocial Assessment  Patient Complaints Anxiety;Depression  Eye Contact Fair  Facial Expression Pensive  Affect Appropriate to circumstance;Sad;Sullen  Speech Logical/coherent  Interaction Assertive  Motor Activity Slow  Appearance/Hygiene In scrubs  Behavior Characteristics Cooperative;Anxious  Mood Depressed;Anxious;Sad  Thought Process  Coherency WDL  Content WDL  Delusions None reported or observed  Perception WDL  Hallucination None reported or observed  Judgment WDL  Confusion None  Danger to Self  Current suicidal ideation? Passive  Self-Injurious Behavior No self-injurious ideation or behavior indicators observed or expressed   Agreement Not to Harm Self Yes  Description of Agreement Verbal Contract  Danger to Others  Danger to Others None reported or observed   Pt continues to endorse SI, and said that the voices "are there but they are getting quieter for the most part."  Pt contracts for safety on the unit.  RN administered medications, provided support, and assessed for needs and concerns.  Pt is in no distress at this time.

## 2021-02-18 NOTE — Progress Notes (Signed)
The patient attended group this evening and was appropriate.  

## 2021-02-18 NOTE — Progress Notes (Signed)
Recreation Therapy Notes  Date:  6.17.22 Time: 0930 Location: [address] Dayroom  Group Topic: Stress Management  Goal Area(s) Addresses:  Patient will identify positive stress management techniques. Patient will identify benefits of using stress management post d/c.  Behavioral Response: Attentive  Intervention: Stress Management  Activity :  Meditation.  LRT played on meditation that focused on looking at each day as a new opportunity to do something new and be productive.  Education:  Stress Management, Discharge Planning.   Education Outcome: Acknowledges Education  Clinical Observations/Feedback:  Pt attended and participated.  Pt had no questions and expressed no concerns.    Caroll Rancher, LRT/CTRS         Lillia Abed, Latricia Cerrito A 02/18/2021 11:21 AM

## 2021-02-19 LAB — GLUCOSE, CAPILLARY
Glucose-Capillary: 143 mg/dL — ABNORMAL HIGH (ref 70–99)
Glucose-Capillary: 255 mg/dL — ABNORMAL HIGH (ref 70–99)
Glucose-Capillary: 209 mg/dL — ABNORMAL HIGH (ref 70–99)
Glucose-Capillary: 192 mg/dL — ABNORMAL HIGH (ref 70–99)
Glucose-Capillary: 154 mg/dL — ABNORMAL HIGH (ref 70–99)

## 2021-02-19 NOTE — Progress Notes (Signed)
[facility] MD Progress Note  02/19/2021 3:15 PM Charles Orr  MRN:  [id number]  Subjective: Charles Orr reports, "I'm doing a lot better today. I slept well last night. I feel like I'm about to get back to where I used to be mentally. I have been attending all groups. I feel like I will be ready to go home on Tuesday. On Monday, I will be calling my landlord to make sure the lock on my door is removed so I can get in after discharge. The voices are getting much better, they come & go, not sticking around like few days ago. I feel good".  Reason for admission: Patient is a 60 year old male who was transported to the [facility] emergency department on 02/13/2021 by emergency medical services secondary to suicidal ideation and hearing voices for 2 days.  He also endorsed confusion. Objective: Charles Orr is seen, chart reviewed. The chart findings discussed with the treatment team. He is out of his room, sitting on a chair on the corner of the of [address]. He presents alert, oriented & aware of situation. He is visible on the unit, attending group sessions. He says he is feeling a lot better today. He says he is still hearing voices & the voices has lessened tremendously, no longer sticking around because they come & go. He denies any SI, denies any plans or intent to hurt himself. Denies any HI today. He rates his depression #2 & anxiety #1. He is taking & tolerating his treatment regimen. Labs reviewed, no changes. The vital signs reviewed; all stable. Patient is in no apparent distress. Will continue current plan of care as already in progress. He says he slept well last night. Will continue current plan of care as already in progress.  Principal Problem: MDD (major depressive disorder), recurrent severe, without psychosis (HCC)  Diagnosis: Principal Problem:   MDD (major depressive disorder), recurrent severe, without psychosis (HCC)  Total Time spent with patient: 15 minutes  Past Psychiatric History:  See H&P  Past Medical History:  Past Medical History:  Diagnosis Date   Angina pectoris (HCC) 07/09/2017   Anxiety    Arthritis    "back" (06/12/2017)   Bipolar disorder, curr episode mixed, severe, with psychotic features (HCC) 10/08/2017   CAP (community acquired pneumonia) 06/10/2017   Chest pain 07/10/2017   Chronic lower back pain    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   Community acquired pneumonia of right lower lobe of lung    Degenerative disc disease, lumbar    Depression    DM (diabetes mellitus) (HCC)    Fibromyalgia    GERD (gastroesophageal reflux disease)    High cholesterol    Hyperglycemia    Hypertension    Localized edema    Myocardial infarction (HCC) 2002   Osteoarthritis    Other schizophrenia (HCC)    Pneumonia 1989   Polysubstance (including opioids) dependence with physiological dependence (HCC) 10/06/2017   Schizoaffective disorder, bipolar type (HCC) 06/16/2017   Schizophrenia (HCC)    Shortness of breath 06/10/2017   Tobacco abuse 07/09/2017   Type II diabetes mellitus (HCC)     Past Surgical History:  Procedure Laterality Date   CARDIAC CATHETERIZATION  12/2006   [name] 01/18/2011   INGUINAL HERNIA REPAIR Right    LEFT HEART CATH AND CORONARY ANGIOGRAPHY N/A 07/11/2017   Procedure: LEFT HEART CATH AND CORONARY ANGIOGRAPHY;  Surgeon: Tonny Bollman, MD;  Location: [facility] INVASIVE CV LAB;  Service: Cardiovascular;  Laterality: N/A;   PARATHYROIDECTOMY     PERICARDIAL TAP  2000   ?; in [place]/notes 01/18/2011   Family History:  Family History  Problem Relation Age of Onset   Hypertension Mother    Diabetes Mother    Heart disease Father    Hypertension Sister    Diabetes Sister    Hypertension Brother    Diabetes Brother    Hypertension Sister    Diabetes Sister    Hypertension Brother    Diabetes Brother    Family Psychiatric  History: See H&P  Social History:  Social History    Substance and Sexual Activity  Alcohol Use Yes   Comment: 06/12/2017 "stopped ~ 1 yr ago"      Social History   Substance and Sexual Activity  Drug Use Yes   Types: Cocaine   Comment: 06/12/2017 "stopped using cocaine ~ 1 yr ago; used marijuana when I was younger" last cocaine use 9 mths ago per pt on 06/15/17    Social History   Socioeconomic History   Marital status: Divorced    Spouse name: Not on file   Number of children: 1   Years of education: Not on file   Highest education level: GED or equivalent  Occupational History   Not on file  Tobacco Use   Smoking status: Every Day    Packs/day: 0.50    Years: 38.00    Pack years: 19.00    Types: Cigarettes   Smokeless tobacco: Current    Types: Chew  Vaping Use   Vaping Use: Never used  Substance and Sexual Activity   Alcohol use: Yes    Comment: 06/12/2017 "stopped ~ 1 yr ago"    Drug use: Yes    Types: Cocaine    Comment: 06/12/2017 "stopped using cocaine ~ 1 yr ago; used marijuana when I was younger" last cocaine use 9 mths ago per pt on 06/15/17   Sexual activity: Not Currently  Other Topics Concern   Not on file  Social History Narrative   Not on file   Social Determinants of Health   Financial Resource Strain: Not on file  Food Insecurity: Not on file  Transportation Needs: Not on file  Physical Activity: Not on file  Stress: Not on file  Social Connections: Not on file   Additional Social History:   Sleep: Good  Appetite:  Good  Current Medications: Current Facility-Administered Medications  Medication Dose Route Frequency Provider Last Rate Last Admin   acetaminophen (TYLENOL) tablet 650 mg  650 mg Oral Q6H PRN Rankin, Shuvon B, NP       alum & mag hydroxide-simeth (MAALOX/MYLANTA) 200-200-20 MG/5ML suspension 30 mL  30 mL Oral Q4H PRN Rankin, Shuvon B, NP       atorvastatin (LIPITOR) tablet 80 mg  80 mg Oral Daily Laveda Abbe, NP   80 mg at 02/19/21 0752   busPIRone (BUSPAR) tablet  10 mg  10 mg Oral BID Laveda Abbe, NP   10 mg at 02/19/21 0754   cloNIDine (CATAPRES) tablet 0.1 mg  0.1 mg Oral BID Laveda Abbe, NP   0.1 mg at 02/19/21 0754   gabapentin (NEURONTIN) capsule 300 mg  300 mg Oral TID Laveda Abbe, NP   300 mg at 02/19/21 1204   insulin aspart (novoLOG) injection 0-15 Units  0-15 Units Subcutaneous TID WC Antonieta Pert, MD   5 Units at 02/19/21 1204   insulin aspart (novoLOG) injection 15 Units  15 Units Subcutaneous TID WC Antonieta Pert, MD   15 Units at 02/19/21 1209   insulin glargine (LANTUS) injection 22 Units  22 Units Subcutaneous q AM Laveda Abbe, NP   22 Units at 02/19/21 0641   insulin glargine (LANTUS) injection 38 Units  38 Units Subcutaneous QHS Laveda Abbe, NP   38 Units at 02/18/21 2110   losartan (COZAAR) tablet 25 mg  25 mg Oral Daily Laveda Abbe, NP   25 mg at 02/19/21 0754   magnesium hydroxide (MILK OF MAGNESIA) suspension 30 mL  30 mL Oral Daily PRN Rankin, Shuvon B, NP       metFORMIN (GLUCOPHAGE) tablet 1,000 mg  1,000 mg Oral BID WC Laveda Abbe, NP   1,000 mg at 02/19/21 0753   risperiDONE (RISPERDAL M-TABS) disintegrating tablet 1 mg  1 mg Oral Daily Antonieta Pert, MD   1 mg at 02/19/21 0755   risperiDONE (RISPERDAL M-TABS) disintegrating tablet 2 mg  2 mg Oral QHS Antonieta Pert, MD   2 mg at 02/18/21 2107   sertraline (ZOLOFT) tablet 100 mg  100 mg Oral Daily Antonieta Pert, MD   100 mg at 02/19/21 0751   traZODone (DESYREL) tablet 100 mg  100 mg Oral QHS Laveda Abbe, NP   100 mg at 02/18/21 2107    Lab Results:  Results for orders placed or performed during the hospital encounter of 02/14/21 (from the past 48 hour(s))  Glucose, capillary     Status: Abnormal   Collection Time: 02/17/21  4:54 PM  Result Value Ref Range   Glucose-Capillary 207 (H) 70 - 99 mg/dL    Comment: Glucose reference range applies only to samples taken after  fasting for at least 8 hours.  Glucose, capillary     Status: Abnormal   Collection Time: 02/17/21  8:36 PM  Result Value Ref Range   Glucose-Capillary 251 (H) 70 - 99 mg/dL    Comment: Glucose reference range applies only to samples taken after fasting for at least 8 hours.  Glucose, capillary     Status: None   Collection Time: 02/18/21  6:03 AM  Result Value Ref Range   Glucose-Capillary 79 70 - 99 mg/dL    Comment: Glucose reference range applies only to samples taken after fasting for at least 8 hours.  Glucose, capillary     Status: Abnormal   Collection Time: 02/18/21 11:42 AM  Result Value Ref Range   Glucose-Capillary 147 (H) 70 - 99 mg/dL    Comment: Glucose reference range applies only to samples taken after fasting for at least 8 hours.  Glucose, capillary     Status: Abnormal   Collection Time: 02/18/21  5:07 PM  Result Value Ref Range   Glucose-Capillary 109 (H) 70 - 99 mg/dL    Comment: Glucose reference range applies only to samples taken after fasting for at least 8 hours.  Glucose, capillary     Status: Abnormal   Collection Time: 02/18/21  8:56 PM  Result Value Ref Range   Glucose-Capillary 244 (H) 70 - 99 mg/dL    Comment: Glucose reference range applies only to samples taken after fasting for at least 8 hours.  Glucose, capillary     Status: Abnormal   Collection Time: 02/19/21  5:44 AM  Result Value Ref Range   Glucose-Capillary 143 (H) 70 - 99 mg/dL    Comment: Glucose reference range applies only to samples taken after fasting for  at least 8 hours.  Glucose, capillary     Status: Abnormal   Collection Time: 02/19/21  8:03 AM  Result Value Ref Range   Glucose-Capillary 255 (H) 70 - 99 mg/dL    Comment: Glucose reference range applies only to samples taken after fasting for at least 8 hours.  Glucose, capillary     Status: Abnormal   Collection Time: 02/19/21 11:25 AM  Result Value Ref Range   Glucose-Capillary 209 (H) 70 - 99 mg/dL    Comment: Glucose  reference range applies only to samples taken after fasting for at least 8 hours.    Blood Alcohol level:  Lab Results  Component Value Date   ETH <10 02/13/2021   ETH <10 10/07/2017    Metabolic Disorder Labs: Lab Results  Component Value Date   HGBA1C 8.6 (H) 02/15/2021   MPG 200 02/15/2021   MPG 240.3 07/09/2017   No results found for: PROLACTIN Lab Results  Component Value Date   CHOL 125 02/15/2021   TRIG 64 02/15/2021   HDL 29 (L) 02/15/2021   CHOLHDL 4.3 02/15/2021   VLDL 13 02/15/2021   LDLCALC 83 02/15/2021   LDLCALC 43 06/10/2017   Physical Findings: AIMS: Facial and Oral Movements Muscles of Facial Expression: None, normal Lips and Perioral Area: None, normal Jaw: None, normal Tongue: None, normal,Extremity Movements Upper (arms, wrists, hands, fingers): None, normal Lower (legs, knees, ankles, toes): None, normal, Trunk Movements Neck, shoulders, hips: None, normal, Overall Severity Severity of abnormal movements (highest score from questions above): None, normal Incapacitation due to abnormal movements: None, normal Patient's awareness of abnormal movements (rate only patient's report): No Awareness, Dental Status Current problems with teeth and/or dentures?: No Does patient usually wear dentures?: No  CIWA:    COWS:     Musculoskeletal: Strength & Muscle Tone: within normal limits Gait & Station: normal Patient leans: N/A  Psychiatric Specialty Exam:  Presentation  General Appearance: Fairly Groomed  Eye Contact:Good  Speech:Normal Rate; Clear and Coherent  Speech Volume:Normal  Handedness:Right  Mood and Affect  Mood:Depressed; Worthless  Affect:Appropriate; Non-Congruent  Thought Process  Thought Processes:Coherent; Linear; Goal Directed  Descriptions of Associations:Intact  Orientation:Full (Time, Place and Person)  Thought Content:Logical  History of Schizophrenia/Schizoaffective disorder:Yes  Duration of Psychotic  Symptoms:Greater than six months  Hallucinations: Reports AH telling him he is worthless.  Ideas of Reference:Paranoia  Suicidal Thoughts: Reports passive SI, denies any plans or intent.  Homicidal Thoughts: Denies  Sensorium  Memory:Immediate Good; Recent Good; Remote Good  Judgment:Intact  Insight:Fair  Executive Functions  Concentration:Good  Attention Span:Good  Recall:Good  Fund of Knowledge:Fair  Language:Good  Psychomotor Activity  Psychomotor Activity: Normal  Assets  Assets:Desire for Improvement; Manufacturing systems engineer; Housing; Resilience  Sleep  Sleep: 6.75    Physical Exam: Physical Exam Vitals and nursing note reviewed.  HENT:     Head: Normocephalic.     Nose: Nose normal.     Mouth/Throat:     Pharynx: Oropharynx is clear.  Eyes:     Pupils: Pupils are equal, round, and reactive to light.  Cardiovascular:     Rate and Rhythm: Normal rate.     Pulses: Normal pulses.  Pulmonary:     Effort: Pulmonary effort is normal.  Genitourinary:    Comments: Deferred Musculoskeletal:        General: Normal range of motion.     Cervical back: Normal range of motion.  Skin:    General: Skin is warm and dry.  Neurological:     General: No focal deficit present.     Mental Status: He is alert and oriented to person, place, and time.   Review of Systems  Constitutional: Negative.   HENT: Negative.    Eyes: Negative.   Respiratory: Negative.    Cardiovascular: Negative.   Gastrointestinal: Negative.   Genitourinary: Negative.   Musculoskeletal: Negative.   Skin: Negative.   Neurological: Negative.   Endo/Heme/Allergies: Negative.   Psychiatric/Behavioral:  Positive for depression, hallucinations (Reports AH), substance abuse and suicidal ideas. The patient has insomnia. The patient is not nervous/anxious.   Blood pressure (!) 130/91, pulse 85, temperature 97.9 F (36.6 C), temperature source Oral, resp. rate 18, height 6\' 2"  (1.88 m), weight  123.8 kg, SpO2 100 %. Body mass index is 35.05 kg/m.  Treatment Plan Summary: Daily contact with patient to assess and evaluate symptoms and progress in treatment and Medication management.   Continue inpatient hospitalization.  Will continue today 02/19/2021 plan as below except where it is noted.   Mood control. Continue Risperdal M-tab 1 mg po daily. Continue Risperdal M-tab 2 mg po Q hs.  Depression.  Continue Sertraline 100 mg po daily.   Anxiety. Continue Buspar 10 mg po bid.   Insomnia.  Continue Trazodone 100 mg po q hs.   Other medical issues: continue;  Lipitor 80 mg po Q bedtime for hyperlipidemia.  Clonidine 0.1 mg po bid for HTN.  Gabapentin 300 mg po tid for neuropathic pain. Sliding scale insulin tid with meals per BS result.  novoLog insulin 15 units subq tid with meals for DM.  Lantus insulin 22 units 38 units subQ Q am (07:00 am).  Lantus insulin 38 units subq q hs for DM. Losartan 25 mg po daily for HTN.  Metformin 1000 mg po bid for DM.  Continue the other prn medications as recommended for other medical complaints/symptoms. Encourage group participation  Armandina StammerAgnes Chase Arnall, NP, pmhnp, fnp-bc 02/19/2021, 3:15 PM

## 2021-02-19 NOTE — [facility] Group Notes (Signed)
  [facility] LCSW Group Therapy Note  Date/Time:  02/19/2021 10-11am  Type of Therapy and Topic:  Group Therapy:  Feelings About Hospitalization  Participation Level:  Active   Description of Group This process group involved patients discussing their feelings related to being hospitalized, as well as the benefits they see to being in the hospital.  These feelings and benefits were itemized.  The group then brainstormed specific ways in which they could seek those same benefits when they discharge and return home.  Therapeutic Goals Patient will identify and describe positive and negative feelings related to hospitalization Patient will verbalize benefits of hospitalization to themselves personally Patients will brainstorm together ways they can obtain similar benefits in the outpatient setting, identify barriers to wellness and possible solutions  Summary of Patient Progress:  The patient expressed his primary feelings about being hospitalized are that at first he was afraid to tell people where he was.  He talked about this being his second time in this hospital and how it has helped him to realize that he does have problems.  He was very receptive and interactive throughout group.  Therapeutic Modalities Cognitive Behavioral Therapy Motivational Interviewing    Bryam Taborda, LCSW 02/19/2021, 1:50 PM

## 2021-02-19 NOTE — Progress Notes (Addendum)
   02/19/21 1017  Psych Admission Type (Psych Patients Only)  Admission Status Voluntary  Psychosocial Assessment  Patient Complaints Depression  Eye Contact Fair  Facial Expression Pensive  Affect Appropriate to circumstance  Speech Logical/coherent  Interaction Assertive  Motor Activity Slow  Appearance/Hygiene In scrubs  Behavior Characteristics Cooperative;Calm  Mood Sad  Thought Process  Coherency WDL  Content WDL  Delusions None reported or observed  Perception WDL  Hallucination None reported or observed  Judgment WDL  Confusion None  Danger to Self  Current suicidal ideation? Passive  Self-Injurious Behavior No self-injurious ideation or behavior indicators observed or expressed   Agreement Not to Harm Self Yes  Description of Agreement verbal contract  Danger to Others  Danger to Others None reported or observed    D. Pt presents as friendly and is pleasant during interactions. He has been calm and cooperative, visible in the milieu interacting well with peers and staff, and observed attending groups.  Pt currently denies SI/HI and AVH  A. Labs and vitals monitored. Pt given and educated on medications. Pt supported emotionally and encouraged to express concerns and ask questions.   R. Pt remains safe with 15 minute checks. Will continue POC.

## 2021-02-19 NOTE — Progress Notes (Signed)
[place] NOVEL CORONAVIRUS (COVID-19) DAILY CHECK-OFF SYMPTOMS - answer yes or no to each - every day NO YES  Have you had a fever in the past 24 hours?  . Fever (Temp > 37.80C / 100F) X   Have you had any of these symptoms in the past 24 hours? . New Cough .  Sore Throat  .  Shortness of Breath .  Difficulty Breathing .  Unexplained Body Aches   X   Have you had any one of these symptoms in the past 24 hours not related to allergies?   . Runny Nose .  Nasal Congestion .  Sneezing   X   If you have had runny nose, nasal congestion, sneezing in the past 24 hours, has it worsened?  X   EXPOSURES - check yes or no X   Have you traveled outside the state in the past 14 days?  X   Have you been in contact with someone with a confirmed diagnosis of COVID-19 or PUI in the past 14 days without wearing appropriate PPE?  X   Have you been living in the same home as a person with confirmed diagnosis of COVID-19 or a PUI (household contact)?    X   Have you been diagnosed with COVID-19?    X              What to do next: Answered NO to all: Answered YES to anything:   Proceed with unit schedule Follow the [facility] Inpatient Flowsheet.   

## 2021-02-19 NOTE — Progress Notes (Signed)
Psychoeducational Group Note  Date:  02/19/2021 Time:  2015  Group Topic/Focus:  Wrap up group  Participation Level: Did Not Attend  Participation Quality:  Not Applicable  Affect:  Not Applicable  Cognitive:  Not Applicable  Insight:  Not Applicable  Engagement in Group: Not Applicable  Additional Comments:  Did not attend.   Marcille Buffy 02/19/2021, 9:17 PM

## 2021-02-19 NOTE — [facility] Group Notes (Signed)
[facility] Group Notes:  (Nursing/MHT/Case Management/Adjunct)  Date:  02/19/2021  Time:  9:59 AM  Type of Therapy:  Goals group  Participation Level:  Active  Participation Quality:  Appropriate, Attentive, and Sharing  Affect:  Depressed  Cognitive:  Alert and Appropriate  Insight:  Appropriate and Good  Engagement in Group:  Engaged  Modes of Intervention:  Discussion and Education  Summary of Progress/Problems:  Charles Orr stated his goal for today is "work on self control."  H reported sleep was fair and got 5 hours of sleep last night.  One positive thing about himself is "I love to help others."

## 2021-02-20 LAB — GLUCOSE, CAPILLARY
Glucose-Capillary: 147 mg/dL — ABNORMAL HIGH (ref 70–99)
Glucose-Capillary: 271 mg/dL — ABNORMAL HIGH (ref 70–99)
Glucose-Capillary: 196 mg/dL — ABNORMAL HIGH (ref 70–99)
Glucose-Capillary: 133 mg/dL — ABNORMAL HIGH (ref 70–99)
Glucose-Capillary: 248 mg/dL — ABNORMAL HIGH (ref 70–99)

## 2021-02-20 NOTE — Progress Notes (Signed)
[facility]  02/20/2021 4:21 PM Arlis PortaKarl J Sava  MRN:  [id number]  Subjective: Charles HawthornKarl reports, "I'm doing a good. I slept well last night. I have no complain today".  Reason for admission: Patient is a 60 year old male who [facility] on 02/13/2021 by emergency medical services secondary to suicidal ideation and hearing voices for 2 days.  He also endorsed confusion. Objective: Charles HawthornKarl is seen, chart reviewed. The chart findings discussed with the treatment team. He is out of his room, sitting on a chair on the corner of the of [address]. He presents alert, oriented & aware of situation. He is visible on the unit, attending group sessions. He says he is feeling a lot better today. He says he is still hearing voices & the voices has lessened tremendously, no longer sticking around because they come & go. He denies any SI, denies any plans or intent to hurt himself. Denies any HI today. He rates his depression #2 & anxiety #1. He is taking & tolerating his treatment regimen. Labs reviewed, no changes. The vital signs reviewed; all stable. Patient is in no apparent distress. Will continue current plan of care as already in progress. He says he slept well last night. Will continue current plan of care as already in progress.  Principal Problem: MDD (major depressive disorder), recurrent severe, without psychosis (HCC)  Diagnosis: Principal Problem:   MDD (major depressive disorder), recurrent severe, without psychosis (HCC)  Total Time spent with patient: 15 minutes  Past Psychiatric History: See H&P  Past Medical History:  Past Medical History:  Diagnosis Date   Angina pectoris (HCC) 07/09/2017   Anxiety    Arthritis    "back" (06/12/2017)   Bipolar disorder, curr episode mixed, severe, with psychotic features (HCC) 10/08/2017   CAP (community acquired pneumonia) 06/10/2017   Chest pain 07/10/2017   Chronic lower back pain    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) 10/06/2017   Community acquired pneumonia of right lower lobe of lung    Degenerative disc disease, lumbar    Depression    DM (diabetes mellitus) (HCC)    Fibromyalgia    GERD (gastroesophageal reflux disease)    High cholesterol    Hyperglycemia    Hypertension    Localized edema    Myocardial infarction (HCC) 2002   Osteoarthritis    Other schizophrenia (HCC)    Pneumonia 1989   Polysubstance (including opioids) dependence with physiological dependence (HCC) 10/06/2017   Schizoaffective disorder, bipolar type (HCC) 06/16/2017   Schizophrenia (HCC)    Shortness of breath 06/10/2017   Tobacco abuse 07/09/2017   Type II diabetes mellitus (HCC)     Past Surgical History:  Procedure Laterality Date   CARDIAC CATHETERIZATION  12/2006   [name]/notes 01/18/2011   INGUINAL HERNIA REPAIR Right    LEFT HEART CATH AND CORONARY ANGIOGRAPHY N/A 07/11/2017   Procedure: LEFT HEART CATH AND CORONARY ANGIOGRAPHY;  Surgeon: Tonny Bollmanooper, Michael, MD;  [facility];  Service: Cardiovascular;  Laterality: N/A;   PARATHYROIDECTOMY     PERICARDIAL TAP  2000   ?; in [place]/notes 01/18/2011   Family History:  Family History  Problem Relation Age of Onset   Hypertension Mother    Diabetes Mother    Heart disease Father    Hypertension Sister    Diabetes Sister    Hypertension Brother    Diabetes Brother    Hypertension Sister  Diabetes Sister    Hypertension Brother    Diabetes Brother    Family Psychiatric  History: See H&P  Social History:  Social History   Substance and Sexual Activity  Alcohol Use Yes   Comment: 06/12/2017 "stopped ~ 1 yr ago"      Social History   Substance and Sexual Activity  Drug Use Yes   Types: Cocaine   Comment: 06/12/2017 "stopped using cocaine ~ 1 yr ago; used marijuana when I was younger" last cocaine use 9 mths ago per pt on 06/15/17    Social History   Socioeconomic History    Marital status: Divorced    Spouse name: Not on file   Number of children: 1   Years of education: Not on file   Highest education level: GED or equivalent  Occupational History   Not on file  Tobacco Use   Smoking status: Every Day    Packs/day: 0.50    Years: 38.00    Pack years: 19.00    Types: Cigarettes   Smokeless tobacco: Current    Types: Chew  Vaping Use   Vaping Use: Never used  Substance and Sexual Activity   Alcohol use: Yes    Comment: 06/12/2017 "stopped ~ 1 yr ago"    Drug use: Yes    Types: Cocaine    Comment: 06/12/2017 "stopped using cocaine ~ 1 yr ago; used marijuana when I was younger" last cocaine use 9 mths ago per pt on 06/15/17   Sexual activity: Not Currently  Other Topics Concern   Not on file  Social History Narrative   Not on file   Social Determinants of Health   Financial Resource Strain: Not on file  Food Insecurity: Not on file  Transportation Needs: Not on file  Physical Activity: Not on file  Stress: Not on file  Social Connections: Not on file   Additional Social History:   Sleep: Good  Appetite:  Good  Current Medications: Current Facility-Administered Medications  Medication Dose Route Frequency Provider Last Rate Last Admin   acetaminophen (TYLENOL) tablet 650 mg  650 mg Oral Q6H PRN Rankin, Shuvon B, NP       alum & mag hydroxide-simeth (MAALOX/MYLANTA) 200-200-20 MG/5ML suspension 30 mL  30 mL Oral Q4H PRN Rankin, Shuvon B, NP       atorvastatin (LIPITOR) tablet 80 mg  80 mg Oral Daily Laveda Abbe, NP   80 mg at 02/20/21 0734   busPIRone (BUSPAR) tablet 10 mg  10 mg Oral BID Laveda Abbe, NP   10 mg at 02/20/21 0734   cloNIDine (CATAPRES) tablet 0.1 mg  0.1 mg Oral BID Laveda Abbe, NP   0.1 mg at 02/20/21 0734   gabapentin (NEURONTIN) capsule 300 mg  300 mg Oral TID Laveda Abbe, NP   300 mg at 02/20/21 1204   insulin aspart (novoLOG) injection 0-15 Units  0-15 Units Subcutaneous TID WC  Antonieta Pert, MD   3 Units at 02/20/21 1200   insulin aspart (novoLOG) injection 15 Units  15 Units Subcutaneous TID WC Antonieta Pert, MD   15 Units at 02/20/21 1257   insulin glargine (LANTUS) injection 22 Units  22 Units Subcutaneous q AM Laveda Abbe, NP   22 Units at 02/20/21 0622   insulin glargine (LANTUS) injection 38 Units  38 Units Subcutaneous QHS Laveda Abbe, NP   38 Units at 02/19/21 2154   losartan (COZAAR) tablet 25 mg  25 mg  Oral Daily Laveda Abbe, NP   25 mg at 02/20/21 0734   magnesium hydroxide (MILK OF MAGNESIA) suspension 30 mL  30 mL Oral Daily PRN Rankin, Shuvon B, NP       metFORMIN (GLUCOPHAGE) tablet 1,000 mg  1,000 mg Oral BID WC Laveda Abbe, NP   1,000 mg at 02/20/21 0735   risperiDONE (RISPERDAL M-TABS) disintegrating tablet 1 mg  1 mg Oral Daily Antonieta Pert, MD   1 mg at 02/20/21 0735   risperiDONE (RISPERDAL M-TABS) disintegrating tablet 2 mg  2 mg Oral QHS Antonieta Pert, MD   2 mg at 02/19/21 2151   sertraline (ZOLOFT) tablet 100 mg  100 mg Oral Daily Antonieta Pert, MD   100 mg at 02/20/21 0734   traZODone (DESYREL) tablet 100 mg  100 mg Oral QHS Laveda Abbe, NP   100 mg at 02/19/21 2151    Lab Results:  Results for orders placed or performed during the hospital encounter of 02/14/21 (from the past 48 hour(s))  Glucose, capillary     Status: Abnormal   Collection Time: 02/18/21  5:07 PM  Result Value Ref Range   Glucose-Capillary 109 (H) 70 - 99 mg/dL    Comment: Glucose reference range applies only to samples taken after fasting for at least 8 hours.  Glucose, capillary     Status: Abnormal   Collection Time: 02/18/21  8:56 PM  Result Value Ref Range   Glucose-Capillary 244 (H) 70 - 99 mg/dL    Comment: Glucose reference range applies only to samples taken after fasting for at least 8 hours.  Glucose, capillary     Status: Abnormal   Collection Time: 02/19/21  5:44 AM  Result Value  Ref Range   Glucose-Capillary 143 (H) 70 - 99 mg/dL    Comment: Glucose reference range applies only to samples taken after fasting for at least 8 hours.  Glucose, capillary     Status: Abnormal   Collection Time: 02/19/21  8:03 AM  Result Value Ref Range   Glucose-Capillary 255 (H) 70 - 99 mg/dL    Comment: Glucose reference range applies only to samples taken after fasting for at least 8 hours.  Glucose, capillary     Status: Abnormal   Collection Time: 02/19/21 11:25 AM  Result Value Ref Range   Glucose-Capillary 209 (H) 70 - 99 mg/dL    Comment: Glucose reference range applies only to samples taken after fasting for at least 8 hours.  Glucose, capillary     Status: Abnormal   Collection Time: 02/19/21  4:37 PM  Result Value Ref Range   Glucose-Capillary 192 (H) 70 - 99 mg/dL    Comment: Glucose reference range applies only to samples taken after fasting for at least 8 hours.  Glucose, capillary     Status: Abnormal   Collection Time: 02/19/21  9:42 PM  Result Value Ref Range   Glucose-Capillary 154 (H) 70 - 99 mg/dL    Comment: Glucose reference range applies only to samples taken after fasting for at least 8 hours.   Comment 1 Notify RN    Comment 2 Document in Chart   Glucose, capillary     Status: Abnormal   Collection Time: 02/20/21  5:42 AM  Result Value Ref Range   Glucose-Capillary 147 (H) 70 - 99 mg/dL    Comment: Glucose reference range applies only to samples taken after fasting for at least 8 hours.   Comment 1 Notify  RN    Comment 2 Document in Chart   Glucose, capillary     Status: Abnormal   Collection Time: 02/20/21  7:35 AM  Result Value Ref Range   Glucose-Capillary 271 (H) 70 - 99 mg/dL    Comment: Glucose reference range applies only to samples taken after fasting for at least 8 hours.  Glucose, capillary     Status: Abnormal   Collection Time: 02/20/21 11:52 AM  Result Value Ref Range   Glucose-Capillary 196 (H) 70 - 99 mg/dL    Comment: Glucose  reference range applies only to samples taken after fasting for at least 8 hours.   Comment 1 Notify RN     Blood Alcohol level:  Lab Results  Component Value Date   ETH <10 02/13/2021   ETH <10 10/07/2017    Metabolic Disorder Labs: Lab Results  Component Value Date   HGBA1C 8.6 (H) 02/15/2021   MPG 200 02/15/2021   MPG 240.3 07/09/2017   No results found for: PROLACTIN Lab Results  Component Value Date   CHOL 125 02/15/2021   TRIG 64 02/15/2021   HDL 29 (L) 02/15/2021   CHOLHDL 4.3 02/15/2021   VLDL 13 02/15/2021   LDLCALC 83 02/15/2021   LDLCALC 43 06/10/2017   Physical Findings: AIMS: Facial and Oral Movements Muscles of Facial Expression: None, normal Lips and Perioral Area: None, normal Jaw: None, normal Tongue: None, normal,Extremity Movements Upper (arms, wrists, hands, fingers): None, normal Lower (legs, knees, ankles, toes): None, normal, Trunk Movements Neck, shoulders, hips: None, normal, Overall Severity Severity of abnormal movements (highest score from questions above): None, normal Incapacitation due to abnormal movements: None, normal Patient's awareness of abnormal movements (rate only patient's report): No Awareness, Dental Status Current problems with teeth and/or dentures?: No Does patient usually wear dentures?: No  CIWA:    COWS:     Musculoskeletal: Strength & Muscle Tone: within normal limits Gait & Station: normal Patient leans: N/A  Psychiatric Specialty Exam:  Presentation  General Appearance: Fairly Groomed  Eye Contact:Good  Speech:Normal Rate; Clear and Coherent  Speech Volume:Normal  Handedness:Right  Mood and Affect  Mood:Depressed; Worthless  Affect:Appropriate; Non-Congruent  Thought Process  Thought Processes:Coherent; Linear; Goal Directed  Descriptions of Associations:Intact  Orientation:Full (Time, Place and Person)  Thought Content:Logical  History of Schizophrenia/Schizoaffective  disorder:Yes  Duration of Psychotic Symptoms:Greater than six months  Hallucinations: Reports AH telling him he is worthless.  Ideas of Reference:Paranoia  Suicidal Thoughts: Reports passive SI, denies any plans or intent.  Homicidal Thoughts: Denies  Sensorium  Memory:Immediate Good; Recent Good; Remote Good  Judgment:Intact  Insight:Fair  Executive Functions  Concentration:Good  Attention Span:Good  Recall:Good  Fund of Knowledge:Fair  Language:Good  Psychomotor Activity  Psychomotor Activity: Normal  Assets  Assets:Desire for Improvement; Manufacturing systems engineer; Housing; Resilience  Sleep  Sleep: 6.75    Physical Exam: Physical Exam Vitals and nursing note reviewed.  HENT:     Head: Normocephalic.     Nose: Nose normal.     Mouth/Throat:     Pharynx: Oropharynx is clear.  Eyes:     Pupils: Pupils are equal, round, and reactive to light.  Cardiovascular:     Rate and Rhythm: Normal rate.     Pulses: Normal pulses.  Pulmonary:     Effort: Pulmonary effort is normal.  Genitourinary:    Comments: Deferred Musculoskeletal:        General: Normal range of motion.     Cervical back: Normal range of  motion.  Skin:    General: Skin is warm and dry.  Neurological:     General: No focal deficit present.     Mental Status: He is alert and oriented to person, place, and time.   Review of Systems  Constitutional: Negative.   HENT: Negative.    Eyes: Negative.   Respiratory: Negative.    Cardiovascular: Negative.   Gastrointestinal: Negative.   Genitourinary: Negative.   Musculoskeletal: Negative.   Skin: Negative.   Neurological: Negative.   Endo/Heme/Allergies: Negative.   Psychiatric/Behavioral:  Positive for depression, hallucinations (Reports AH), substance abuse and suicidal ideas. The patient has insomnia. The patient is not nervous/anxious.   Blood pressure 127/84, pulse 86, temperature (!) 97.5 F (36.4 C), temperature source Oral, resp.  rate 16, height 6\' 2"  (1.88 m), weight 123.8 kg, SpO2 100 %. Body mass index is 35.05 kg/m.  Treatment Plan Summary: Daily contact with patient to assess and evaluate symptoms and progress in treatment and Medication management.   Continue inpatient hospitalization.  Will continue today 02/20/2021 plan as below except where it is noted.   Mood control. Continue Risperdal M-tab 1 mg po daily. Continue Risperdal M-tab 2 mg po Q hs.  Depression.  Continue Sertraline 100 mg po daily.   Anxiety. Continue Buspar 10 mg po bid.   Insomnia.  Continue Trazodone 100 mg po q hs.   Other medical issues: continue;  Lipitor 80 mg po Q bedtime for hyperlipidemia.  Clonidine 0.1 mg po bid for HTN.  Gabapentin 300 mg po tid for neuropathic pain. Sliding scale insulin tid with meals per BS result.  novoLog insulin 15 units subq tid with meals for DM.  Lantus insulin 22 units 38 units subQ Q am (07:00 am).  Lantus insulin 38 units subq q hs for DM. Losartan 25 mg po daily for HTN.  Metformin 1000 mg po bid for DM.  Continue the other prn medications as recommended for other medical complaints/symptoms. Encourage group participation  02/22/2021, NP, pmhnp, fnp-bc 02/20/2021, 4:21 PM

## 2021-02-20 NOTE — [facility] Group Notes (Signed)
Adult Psychoeducational Group Note  Date:  02/20/2021 Time:  10:18 AM  Group Topic/Focus:  Goals Group:   The focus of this group is to help patients establish daily goals to achieve during treatment and discuss how the patient can incorporate goal setting into their daily lives to aide in recovery.  Participation Level:  Active  Participation Quality:  Appropriate  Affect:  Appropriate  Cognitive:  Appropriate  Insight: Appropriate  Engagement in Group:  Engaged  Modes of Intervention:  Discussion  Additional Comments: Patient attended morning goal setting and orientation group. Patient said his goal for today is to be positive and work on Pharmacologist.   Charles Orr W Charles Orr 02/20/2021, 10:18 AM

## 2021-02-20 NOTE — [facility] Group Notes (Signed)
[facility] LCSW Group Therapy Note  02/20/2021  10:00-11:00AM  Type of Therapy and Topic:  Group Therapy:  Acknowledging and Resolving Issues with Fathers  Participation Level:  Active   Description of Group:   Patients in this group were asked to briefly describe their experience with the father figure(s) in their lives, both in childhood and adulthood.  Different types of support provided by these individuals were identified.   Patients were then encouraged to determine whether their father figure was or is a healthy or unhealthy support.  The manner in which that early relationship has shaped patient's feelings and life decisions was pointed out and acknowledged.  Group members gave support to each other.  CSW led a discussion on how helpful it can be to resolve past issues, and how this can be done whether the father figure is now alive or already deceased.  An emphasis was placed on continuing to work with a therapist on these issues  when patients leave the hospital in order to be able to focus on the future instead of the past, to continue becoming healthier and happier.   Therapeutic Goals: 1)  discuss the possibility of father figure(s) being positive and/or negative in one's life, normalizing that some people never had positive experiences with "paternal" persons  2)  describe patient's specific example of father figure(s), allowing time to vent  3)  identify the patient's current need for resolution in the relationship with the aforementioned person  4)  elicit commitments to work on resolving feelings about father figure(s) in order to move forward in life and wellness   Summary of Patient Progress:  The patient expressed full comprehension of the concepts presented, and gave a lot of advice throughout group to younger people.  He shared that he has great memories of his father, who is now 67yo.  They still have a wonderful relationship.  His father is actually a great-great-grandfather to  patient's 8 great-grandchildren.  He feels that the discipline his father instilled in him kept him out of prison.     Therapeutic Modalities:   Processing Brief Solution-Focused Therapy  Lynnell Chad

## 2021-02-20 NOTE — Plan of Care (Signed)
  Problem: Education: Goal: Knowledge of the prescribed therapeutic regimen will improve Outcome: Progressing   Problem: Coping: Goal: Coping ability will improve Outcome: Progressing   Problem: Health Behavior/Discharge Planning: Goal: Compliance with therapeutic regimen will improve Outcome: Progressing

## 2021-02-20 NOTE — Progress Notes (Signed)
[place] NOVEL CORONAVIRUS (COVID-19) DAILY CHECK-OFF SYMPTOMS - answer yes or no to each - every day NO YES  Have you had a fever in the past 24 hours?  . Fever (Temp > 37.80C / 100F) X   Have you had any of these symptoms in the past 24 hours? . New Cough .  Sore Throat  .  Shortness of Breath .  Difficulty Breathing .  Unexplained Body Aches   X   Have you had any one of these symptoms in the past 24 hours not related to allergies?   . Runny Nose .  Nasal Congestion .  Sneezing   X   If you have had runny nose, nasal congestion, sneezing in the past 24 hours, has it worsened?  X   EXPOSURES - check yes or no X   Have you traveled outside the state in the past 14 days?  X   Have you been in contact with someone with a confirmed diagnosis of COVID-19 or PUI in the past 14 days without wearing appropriate PPE?  X   Have you been living in the same home as a person with confirmed diagnosis of COVID-19 or a PUI (household contact)?    X   Have you been diagnosed with COVID-19?    X              What to do next: Answered NO to all: Answered YES to anything:   Proceed with unit schedule Follow the [facility] Inpatient Flowsheet.   

## 2021-02-20 NOTE — Progress Notes (Signed)
   D:  Pt appears mildly anxious and sad, but reports no complaints.  Pt reports he will be ready to go home on Tuesday.  Pt denies SI/HI, but verbally contracts for safety should the thoughts arise.  Pt denies AVH.  A:  Labs/Vitals monitored; Medication education provided; Pt encouraged to communicate concerns.  R:  Pt remains safe on unit with Q 15 minute safety checks.  Will continue POC.     02/19/21 2155  Psych Admission Type (Psych Patients Only)  Admission Status Voluntary  Psychosocial Assessment  Patient Complaints None  Eye Contact Fair  Facial Expression Pensive  Affect Appropriate to circumstance  Speech Logical/coherent  Interaction Assertive  Motor Activity Slow  Appearance/Hygiene In scrubs  Behavior Characteristics Cooperative;Calm  Mood Sad  Thought Process  Coherency WDL  Content WDL  Delusions None reported or observed  Perception WDL  Hallucination None reported or observed  Judgment WDL  Confusion None  Danger to Self  Current suicidal ideation? Denies  Self-Injurious Behavior No self-injurious ideation or behavior indicators observed or expressed   Agreement Not to Harm Self Yes  Description of Agreement verbal contract  Danger to Others  Danger to Others None reported or observed

## 2021-02-20 NOTE — Progress Notes (Addendum)
   02/20/21 1100  Psych Admission Type (Psych Patients Only)  Admission Status Voluntary  Psychosocial Assessment  Patient Complaints Anxiety  Eye Contact Fair  Facial Expression Pensive  Affect Appropriate to circumstance  Speech Logical/coherent  Interaction Assertive  Motor Activity Slow  Appearance/Hygiene In scrubs  Behavior Characteristics Cooperative;Calm  Mood Sad  Thought Process  Coherency WDL  Content WDL  Delusions None reported or observed  Perception WDL  Hallucination None reported or observed  Judgment WDL  Confusion None  Danger to Self  Current suicidal ideation? Denies  Self-Injurious Behavior No self-injurious ideation or behavior indicators observed or expressed   Agreement Not to Harm Self Yes  Description of Agreement verbal contract  Danger to Others  Danger to Others None reported or observed    D. Pt presents with a sad affect/ anxious mood, calm, cooperative and friendly during interactions. Per pt's self inventory, pt rated his depression, hopelessness and anxiety a 5/0/5, respectively. Pt reported that his goal to work on today is "positive thinking". Pt has been visible in the milieu interacting well with peers and staff, and observed attending groups.  Pt currently denies SI/HI and AVH A. Labs and vitals monitored. Pt given and educated on medications. Pt supported emotionally and encouraged to express concerns and ask questions.   R. Pt remains safe with 15 minute checks. Will continue POC.

## 2021-02-20 NOTE — Progress Notes (Signed)
Psychoeducational Group Note  Date:  02/20/2021 Time:  2015  Group Topic/Focus:  Wrap up group  Participation Level: Did Not Attend  Participation Quality:  Not Applicable  Affect:  Not Applicable  Cognitive:  Not Applicable  Insight:  Not Applicable  Engagement in Group: Not Applicable  Additional Comments:  Did not attend.  Marcille Buffy 02/20/2021, 9:57 PM

## 2021-02-21 LAB — GLUCOSE, CAPILLARY
Glucose-Capillary: 196 mg/dL — ABNORMAL HIGH (ref 70–99)
Glucose-Capillary: 217 mg/dL — ABNORMAL HIGH (ref 70–99)
Glucose-Capillary: 253 mg/dL — ABNORMAL HIGH (ref 70–99)
Glucose-Capillary: 162 mg/dL — ABNORMAL HIGH (ref 70–99)

## 2021-02-21 MED ORDER — BUSPIRONE HCL 10 MG PO TABS: 10.0000 mg | ORAL_TABLET | Freq: Two times a day (BID) | ORAL | 0 refills | Status: DC

## 2021-02-21 MED ORDER — SERTRALINE HCL 100 MG PO TABS: 100.0000 mg | ORAL_TABLET | Freq: Every day | ORAL | 0 refills | Status: DC

## 2021-02-21 MED ORDER — INSULIN GLARGINE 100 UNIT/ML ~~LOC~~ SOLN: 38.0000 [IU] | Freq: Every day | SUBCUTANEOUS | 0 refills | Status: DC

## 2021-02-21 MED ORDER — GABAPENTIN 300 MG PO CAPS: 300.0000 mg | ORAL_CAPSULE | Freq: Three times a day (TID) | ORAL | 0 refills | Status: DC

## 2021-02-21 MED ORDER — INSULIN GLARGINE 100 UNIT/ML ~~LOC~~ SOLN: 22.0000 [IU] | Freq: Every morning | SUBCUTANEOUS | 0 refills | Status: DC

## 2021-02-21 MED ORDER — RISPERIDONE 1 MG PO TABS: ORAL_TABLET | ORAL | 0 refills | Status: DC

## 2021-02-21 NOTE — Progress Notes (Signed)
Recreation Therapy Notes  Date:  6.20.22 Time: 0930 Location: [address] Dayroom   Group Topic: Stress Management   Goal Area(s) Addresses: Patient will identify positive stress management techniques. Patient will identify benefits of using stress management post d/c.   Behavioral Response: Attentive   Intervention: Stress Management   Activity :  Meditation.  LRT played a meditation that focused on taking note of any tension that may be in the body or any sensations that may be felt such as cool, warmth, tingles or any slight sensations on the body.   Education:  Stress Management, Discharge Planning.   Education Outcome: Acknowledges Education   Clinical Observations/Feedback: Pt attended and participated.  Pt had no questions and expressed no concerns.       Caroll Rancher, LRT/CTRS         Caroll Rancher A 02/21/2021 12:39 PM

## 2021-02-21 NOTE — [facility] Group Notes (Signed)
Occupational Therapy Group Note Date: 02/21/2021 Group Topic/Focus: Self-Esteem  Group Description: Group encouraged increased engagement and participation through discussion and activity focused on self-esteem. Patients explored and discussed the differences between healthy and low self-esteem and how it affects our daily lives and occupations with a focus on relationships, work, school, self-care, and personal leisure interests. Group discussion then transitioned into identifying specific strategies to boost self-esteem and engaged in a collaborative and independent activity looking at positive thoughts and affirmations.   Therapeutic Goal(s): Understand and recognize the differences between healthy and low self-esteem Identify healthy strategies to improve/build self-esteem Participation Level: Patient did not attend OT group session despite personal invitation.    Plan: Continue to engage patient in OT groups 2 - 3x/week.  02/21/2021  Shoshanna Mcquitty, MOT, OTR/L 

## 2021-02-21 NOTE — BHH Counselor (Signed)
CSW spoke with Charles Orr who states that he would like to be discharged tomorrow because his check should be in the mailbox and he would like to get it so he can pay his bills.  He states that he is having keys made to his apartment and can have these ready by tomorrow.  He also states that his son can pick him up at 12:00pm tomorrow and take him home.  [name] states that he would need to know today if he is discharging tomorrow so he can have everything ready to go. CSW will let the doctors know this information.

## 2021-02-21 NOTE — Discharge Summary (Signed)
Physician Discharge Summary Note  Patient:  Charles Orr is an 60 y.o., male MRN:  [id number] DOB:  1960/12/02 Patient phone:  [phone] (home)  Patient address:   [address] [place] [address],  Total Time spent with patient: 30 minutes  Date of Admission:  02/14/2021 Date of Discharge: 02/22/2021  Reason for Admission:  (From MD's admission note): Patient is a 60 year old male who was transported to the [facility] emergency department on 02/13/2021 by emergency medical services secondary to suicidal ideation and hearing voices for 2 days.  He also endorsed confusion.  He stated he was being followed by a psychiatrist on [address], but apparently stated that the provider left the practice and he had not gotten a new one.  The patient reported still taking his medications.  He was admitted to the hospital for evaluation and stabilization.  He is hearing impaired as well is sight impaired.  Patient is a 60 year old male with the above-stated past psychiatric history who is seen on admission.  He will be admitted to the hospital.  He will be integrated in the milieu.  He will be encouraged to attend groups.  We do not have any recent visits in the chart from psychiatry.  His last admission to our facility was in 2019.  At that time his psychiatric medications included Risperdal 1 mg p.o. daily and 2 mg p.o. nightly as well as sertraline 100 mg p.o. daily.  He was also on trazodone 100 mg p.o. nightly as needed.  The emergency room note from 6/12 showed Risperdal 1 mg p.o. daily and 2 mg p.o. nightly, but that was from a prescription in 2019 at discharge.  His list of medicines from endocrinology included metformin, Lantus insulin, NovoLog FlexPen, and exenatide.  I will write for those and see if we have them on our formulary.  We will have to contact his pharmacy for all of his list of medications.  Review of his admission laboratories revealed a glucose of 92 this  morning.  It was 209 in the emergency department.  His creatinine was 1.16.  Liver function enzymes were normal.  Lipid panel was normal.  CBC was normal.  Differential was not done.  Acetaminophen was less than 10, salicylate less than 7.  TSH was normal at 1.905.  Respiratory panel was negative.  Blood alcohol was less than 10.  Salicylate less than 7.  Drug screen was positive for cocaine.  He had a CT scan of the head that showed no acute findings.  EKG was not done.  His blood pressure is mildly elevated at 140/74 and repeat was 146/73.  Pulse was 74.  Pulse oximetry was 100% on room air.  He got 5.75 hours of sleep last night.      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   Community acquired pneumonia of right lower lobe of lung  Degenerative disc disease, lumbar    Depression    DM (diabetes mellitus) (HCC)    Fibromyalgia    GERD (gastroesophageal reflux disease)    High cholesterol    Hyperglycemia    Hypertension    Localized edema    Myocardial infarction (HCC) 2002   Osteoarthritis    Other schizophrenia (HCC)    Pneumonia 1989   Polysubstance (including opioids) dependence with physiological dependence (HCC) 10/06/2017   Schizoaffective disorder, bipolar type (HCC) 06/16/2017   Schizophrenia (HCC)    Shortness of breath 06/10/2017   Tobacco abuse  07/09/2017   Type II diabetes mellitus (HCC)     Past Surgical History:  Procedure Laterality Date   CARDIAC CATHETERIZATION  12/2006   [name] 01/18/2011   INGUINAL HERNIA REPAIR Right    LEFT HEART CATH AND CORONARY ANGIOGRAPHY N/A 07/11/2017   Procedure: LEFT HEART CATH AND CORONARY ANGIOGRAPHY;  Surgeon: Tonny Bollman, MD;  Location: [facility] INVASIVE CV LAB;  Service: Cardiovascular;  Laterality: N/A;   PARATHYROIDECTOMY     PERICARDIAL TAP  2000   ?; in [place]/notes 01/18/2011   Family History:  Family History  Problem Relation Age of Onset   Hypertension Mother    Diabetes Mother    Heart disease Father    Hypertension Sister    Diabetes Sister    Hypertension Brother    Diabetes Brother    Hypertension Sister    Diabetes Sister    Hypertension Brother    Diabetes Brother    Family Psychiatric  History: See H&P Social History:  Social History   Substance and Sexual Activity  Alcohol Use Yes   Comment: 06/12/2017 "stopped ~ 1 yr ago"      Social History   Substance and Sexual Activity  Drug Use Yes   Types: Cocaine   Comment: 06/12/2017 "stopped using cocaine ~ 1 yr ago; used marijuana when I was younger" last cocaine use 9 mths ago per pt on 06/15/17    Social History   Socioeconomic History   Marital status: Divorced    Spouse name: Not on file   Number of children: 1   Years of education: Not on file   Highest education level: GED or equivalent  Occupational History   Not on file  Tobacco Use   Smoking status: Every Day    Packs/day: 0.50    Years: 38.00    Pack years: 19.00    Types: Cigarettes   Smokeless tobacco: Current    Types: Chew  Vaping Use   Vaping Use: Never used  Substance and Sexual Activity   Alcohol use: Yes    Comment: 06/12/2017 "stopped ~ 1 yr ago"    Drug use: Yes    Types: Cocaine    Comment: 06/12/2017 "stopped using cocaine ~ 1 yr ago; used marijuana when I was younger" last cocaine use 9 mths ago per pt on 06/15/17   Sexual  activity: Not Currently  Other Topics Concern   Not on file  Social History Narrative   Not on file   Social Determinants of Health   Financial Resource Strain: Not on file  Food Insecurity: Not on file  Transportation Needs: Not on file  Physical Activity: Not on file  Stress: Not on file  Social Connections: Not on file    Hospital Course:  After the above admission evaluation, Bayron's presenting symptoms were noted. He was recommended for mood stabilization treatments. The medication regimen targeting those presenting symptoms were discussed with him &  initiated with his consent. His home medications were restarted and titrated to target his symptoms. His auditory hallucinations have decreased since admissions. His UDS on arrival to the ED was positive for cocaine, BAL negative. He was however medicated, stabilized & discharged on the medications as listed on his discharge medication lists below. Besides the mood stabilization treatments, Egan was also enrolled & participated in the group counseling sessions being offered & held on this unit. He learned coping skills. He presented no other significant pre-existing medical issues that required treatment. He tolerated his treatment regimen without any adverse effects or reactions reported.   During the course of his hospitalization, the 15-minute checks were adequate to ensure patient's safety. Zaeden did not display any dangerous, violent or suicidal behavior on the unit.  He interacted with patients & staff appropriately, participated appropriately in the group sessions/therapies. His medications were addressed & adjusted to meet his needs. He was recommended for outpatient follow-up care & medication management upon discharge to assure continuity of care & mood stability.  At the time of discharge patient is not reporting any acute suicidal/homicidal ideations. He feels more confident about his self-care & in managing his mental health. He  currently denies any new issues or concerns. Education and supportive counseling provided throughout his hospital stay & upon discharge.   Today upon his discharge evaluation with the attending psychiatrist, Darrnell shares he is doing well. He denies any other specific concerns. He is sleeping well. His appetite is good. He denies other physical complaints. He denies AH/VH, delusional thoughts or paranoia. He does not appear to be responding to any internal stimuli. He feels that his medications have been helpful & is in agreement to continue his current treatment regimen as recommended. He was able to engage in safety planning including plan to return to [facility] or contact emergency services if he feels unable to maintain his own safety or the safety of others. Pt had no further questions, comments, or concerns. He left [facility] with all personal belongings in no apparent distress. Transportation per private vehicle with his son.    Physical Findings: AIMS: Facial and Oral Movements Muscles of Facial Expression: None, normal Lips and Perioral Area: None, normal Jaw: None, normal Tongue: None, normal,Extremity Movements Upper (arms, wrists, hands, fingers): None, normal Lower (legs, knees, ankles, toes): None, normal, Trunk Movements Neck, shoulders, hips: None, normal, Overall Severity Severity of abnormal movements (highest score from questions above): None, normal Incapacitation due to abnormal movements: None, normal Patient's awareness of abnormal movements (rate only patient's report): No Awareness, Dental Status Current problems with teeth and/or dentures?: No Does patient usually wear dentures?: No  CIWA:    COWS:     Musculoskeletal: Strength & Muscle Tone: within normal limits Gait & Station: normal Patient leans: N/A  Psychiatric Specialty Exam:  Presentation  General Appearance: Appropriate for Environment  Eye Contact:Good  Speech:Normal Rate  Speech  Volume:Normal  Handedness:Right  Mood and Affect  Mood:Euthymic  Affect:Congruent  Thought Process  Thought Processes:Coherent  Descriptions of Associations:Intact  Orientation:Full (Time, Place and Person)  Thought Content:Logical  History of Schizophrenia/Schizoaffective disorder:No  Duration of Psychotic Symptoms:Less than six months  Hallucinations:Hallucinations: None  Ideas of Reference:None  Suicidal Thoughts:Suicidal Thoughts: No  Homicidal Thoughts:Homicidal Thoughts: No  Sensorium  Memory:Immediate Fair; Recent Fair; Remote Fair  Judgment:Intact  Insight:Fair  Executive Functions  Concentration:Good  Attention Span:Good  Recall:Good  Fund of Knowledge:Good  Language:Good  Psychomotor Activity  Psychomotor Activity:Psychomotor Activity: Normal  Assets  Assets:Desire  for Improvement; Resilience; Social Support; Housing  Sleep  Sleep:Sleep: Good  Physical Exam: Physical Exam Vitals and nursing note reviewed.  Constitutional:      Appearance: Normal appearance.  Pulmonary:     Effort: Pulmonary effort is normal.  Musculoskeletal:        General: Normal range of motion.     Cervical back: Normal range of motion.  Neurological:     General: No focal deficit present.     Mental Status: He is alert and oriented to person, place, and time.   ROS Blood pressure 136/76, pulse 78, temperature 97.9 F (36.6 C), temperature source Oral, resp. rate 16, height 6\' 2"  (1.88 m), weight 123.8 kg, SpO2 100 %. Body mass index is 35.05 kg/m.   Social History   Tobacco Use  Smoking Status Every Day   Packs/day: 0.50   Years: 38.00   Pack years: 19.00   Types: Cigarettes  Smokeless Tobacco Current   Types: Chew   Tobacco Cessation:  N/A, patient does not currently use tobacco products   Blood Alcohol level:  Lab Results  Component Value Date   ETH <10 02/13/2021   ETH <10 10/07/2017    Metabolic Disorder Labs:  Lab Results   Component Value Date   HGBA1C 8.6 (H) 02/15/2021   MPG 200 02/15/2021   MPG 240.3 07/09/2017   No results found for: PROLACTIN Lab Results  Component Value Date   CHOL 125 02/15/2021   TRIG 64 02/15/2021   HDL 29 (L) 02/15/2021   CHOLHDL 4.3 02/15/2021   VLDL 13 02/15/2021   LDLCALC 83 02/15/2021   LDLCALC 43 06/10/2017    See Psychiatric Specialty Exam and Suicide Risk Assessment completed by Attending Physician prior to discharge.  Discharge destination:  Home  Is patient on multiple antipsychotic therapies at discharge:  No   Has Patient had three or more failed trials of antipsychotic monotherapy by history:  No  Recommended Plan for Multiple Antipsychotic Therapies: NA  Discharge Instructions     Diet - low sodium heart healthy   Complete by: As directed    Increase activity slowly   Complete by: As directed       Allergies as of 02/21/2021   No Known Allergies      Medication List     STOP taking these medications    gabapentin 600 MG tablet Commonly known as: NEURONTIN Replaced by: gabapentin 300 MG capsule   HYDROcodone-acetaminophen 10-325 MG tablet Commonly known as: NORCO   hydrOXYzine 50 MG capsule Commonly known as: VISTARIL   Lantus SoloStar 100 UNIT/ML Solostar Pen Generic drug: insulin glargine Replaced by: insulin glargine 100 UNIT/ML injection   meclizine 25 MG tablet Commonly known as: ANTIVERT   naproxen sodium 220 MG tablet Commonly known as: ALEVE   NovoLOG FlexPen 100 UNIT/ML FlexPen Generic drug: insulin aspart   oxyCODONE-acetaminophen 7.5-325 MG tablet Commonly known as: PERCOCET       TAKE these medications      Indication  albuterol 108 (90 Base) MCG/ACT inhaler Commonly known as: VENTOLIN HFA Inhale 2 puffs into the lungs every 6 (six) hours as needed for wheezing or shortness of breath.  Indication: Asthma   atorvastatin 80 MG tablet Commonly known as: LIPITOR Take 80 mg by mouth at bedtime.   Indication: High Amount of Fats in the Blood   busPIRone 10 MG tablet Commonly known as: BUSPAR Take 1 tablet (10 mg total) by mouth 2 (two) times daily.  Indication: Anxiety Disorder  cloNIDine 0.1 MG tablet Commonly known as: CATAPRES Take 0.1 mg by mouth 2 (two) times daily.  Indication: High Blood Pressure Disorder   dorzolamide-timolol 22.3-6.8 MG/ML ophthalmic solution Commonly known as: COSOPT Place 1 drop into the right eye 2 (two) times daily.  Indication: Increased Pressure Within the Eye   ergocalciferol 1.25 MG (50000 UT) capsule Commonly known as: VITAMIN D2 Take 50,000 Units by mouth once a week.  Indication: Vitamin D Deficiency   gabapentin 300 MG capsule Commonly known as: NEURONTIN Take 1 capsule (300 mg total) by mouth 3 (three) times daily. Replaces: gabapentin 600 MG tablet  Indication: Diabetes with Nerve Disease, chronic pain   insulin glargine 100 UNIT/ML injection Commonly known as: LANTUS Inject 0.38 mLs (38 Units total) into the skin at bedtime.  Indication: Type 2 Diabetes   insulin glargine 100 UNIT/ML injection Commonly known as: LANTUS Inject 0.22 mLs (22 Units total) into the skin in the morning. Start taking on: February 22, 2021 Replaces: Lantus SoloStar 100 UNIT/ML Solostar Pen  Indication: Type 2 Diabetes   Januvia 50 MG tablet Generic drug: sitaGLIPtin Take 50 mg by mouth daily.  Indication: Type 2 Diabetes   losartan 25 MG tablet Commonly known as: COZAAR Take 1 tablet (25 mg total) by mouth daily.    metFORMIN 1000 MG tablet Commonly known as: GLUCOPHAGE Take 1,000 mg by mouth 2 (two) times daily.  Indication: Type 2 Diabetes   risperiDONE 1 MG tablet Commonly known as: RISPERDAL Take 1 tablet (1 mg) by mouth in the mornings & 2 tablets (2 mg) at bedtime: For mood control What changed:  how much to take how to take this when to take this additional instructions  Indication: Hypomanic Episode of Bipolar Disorder, Mood  control   sertraline 100 MG tablet Commonly known as: ZOLOFT Take 1 tablet (100 mg total) by mouth daily. Start taking on: February 22, 2021  Indication: Major Depressive Disorder   traZODone 100 MG tablet Commonly known as: DESYREL Take 100 mg by mouth at bedtime.  Indication: Trouble Sleeping        Follow-up Information     [facility].   Specialty: Behavioral Health Why: Please go to this provider for therapy and medication management services.  Walk in hours are Monday through Wednesday, 8:00 am to 11:00 am.  Services are available first come, first served. Contact information: [phone] [place] [phone]                Follow-up recommendations:  Activity:  as tolerated Diet:  Heart healthy  Comments:  Prescriptions were given at discharge.  Patient is agreeable with the discharge plan.  He was given an opportunity to ask questions.  He appears to feel comfortable with discharge and denies any current suicidal or homicidal thoughts.   Patient is instructed prior to discharge to: Take all medications as prescribed by his mental healthcare provider. Report any adverse effects and or reactions from the medicines to his outpatient provider promptly. Patient has been instructed & cautioned: To not engage in alcohol and or illegal drug use while on prescription medicines. In the event of worsening symptoms, patient is instructed to call the crisis hotline, 911 and or go to the nearest ED for appropriate evaluation and treatment of symptoms. To follow-up with his primary care provider for your other medical issues, concerns and or health care needs.   Signed: Laveda AbbeLaurie Britton Aloha Bartok, NP 02/22/2021, 12:53 PM

## 2021-02-21 NOTE — Progress Notes (Signed)
   Results for LORENZA, WINKLEMAN (MRN [id number]) as of 02/21/2021 04:18  Ref. Range 02/20/2021 21:30  Glucose-Capillary Latest Ref Range: 70 - 99 mg/dL 349 (H)   Patient compliant with medication. Educated on diabetic diet this shift. Denies SI/HI/A/VH. Support and encouragement provided as needed. Q 15 minutes safety checks ongoing without self harm gestures. Patient still monitored on High Fall risk. Patient remains safe this shift.   02/21/21 0400  Psych Admission Type (Psych Patients Only)  Admission Status Voluntary  Psychosocial Assessment  Patient Complaints None  Eye Contact Fair  Facial Expression Pensive  Affect Appropriate to circumstance  Speech Logical/coherent  Interaction Assertive  Motor Activity Slow  Appearance/Hygiene In scrubs  Behavior Characteristics Cooperative;Appropriate to situation  Mood Sad  Thought Process  Coherency WDL  Content WDL  Delusions None reported or observed  Perception WDL  Hallucination None reported or observed  Judgment WDL  Confusion None  Danger to Self  Current suicidal ideation? Denies  Self-Injurious Behavior No self-injurious ideation or behavior indicators observed or expressed   Agreement Not to Harm Self Yes  Description of Agreement verbal contract  Danger to Others  Danger to Others None reported or observed

## 2021-02-21 NOTE — BHH Suicide Risk Assessment (Signed)
[facility] Discharge Suicide Risk Assessment   Principal Problem: MDD (major depressive disorder), recurrent severe, without psychosis (HCC) Discharge Diagnoses: Principal Problem:   MDD (major depressive disorder), recurrent severe, without psychosis (HCC)   Total Time spent with patient: 15 minutes  Musculoskeletal: Strength & Muscle Tone: within normal limits Gait & Station: normal Patient leans: N/A  Psychiatric Specialty Exam  Presentation  General Appearance: Appropriate for Environment  Eye Contact:Good  Speech:Normal Rate  Speech Volume:Normal  Handedness:Right   Mood and Affect  Mood:Euthymic  Duration of Depression Symptoms: Greater than two weeks  Affect:Congruent   Thought Process  Thought Processes:Coherent  Descriptions of Associations:Intact  Orientation:Full (Time, Place and Person)  Thought Content:Logical  History of Schizophrenia/Schizoaffective disorder:No  Duration of Psychotic Symptoms:Less than six months  Hallucinations:Hallucinations: None  Ideas of Reference:None  Suicidal Thoughts:Suicidal Thoughts: No  Homicidal Thoughts:Homicidal Thoughts: No   Sensorium  Memory:Immediate Fair; Recent Fair; Remote Fair  Judgment:Intact  Insight:Fair   Executive Functions  Concentration:Good  Attention Span:Good  Recall:Good  Fund of Knowledge:Good  Language:Good   Psychomotor Activity  Psychomotor Activity:Psychomotor Activity: Normal   Assets  Assets:Desire for Improvement; Resilience; Social Support; Housing   Sleep  Sleep:Sleep: Good   Physical Exam: Physical Exam Vitals and nursing note reviewed.  Constitutional:      Appearance: Normal appearance.  HENT:     Head: Normocephalic and atraumatic.  Pulmonary:     Effort: Pulmonary effort is normal.  Neurological:     General: No focal deficit present.     Mental Status: He is alert and oriented to person, place, and time.   Review of Systems  All other  systems reviewed and are negative. Blood pressure 136/76, pulse 78, temperature 97.9 F (36.6 C), temperature source Oral, resp. rate 16, height 6\' 2"  (1.88 m), weight 123.8 kg, SpO2 100 %. Body mass index is 35.05 kg/m.  Mental Status Per Nursing Assessment::   On Admission:  Suicidal ideation indicated by patient, Suicide plan, Self-harm thoughts  Demographic Factors:  Male, Divorced or widowed, Low socioeconomic status, Living alone, and Unemployed  Loss Factors: Financial problems/change in socioeconomic status  Historical Factors: Impulsivity  Risk Reduction Factors:   NA  Continued Clinical Symptoms:  Depression:   Impulsivity Alcohol/Substance Abuse/Dependencies  Cognitive Features That Contribute To Risk:  None    Suicide Risk:  Minimal: No identifiable suicidal ideation.  Patients presenting with no risk factors but with morbid ruminations; may be classified as minimal risk based on the severity of the depressive symptoms   Follow-up Information     [facility] Follow up.   Specialty: Behavioral Health Why: Please go to this provider for therapy and medication management services.  Walk in hours are Monday through Wednesday, 8:00 am to 11:00 am.  Services are available first come, first served. Contact information: [address] [place] [phone]                Plan Of Care/Follow-up recommendations:  Activity:  ad lib  [phone], MD 02/21/2021, 4:47 PM

## 2021-02-21 NOTE — Progress Notes (Signed)
[facility] MD Progress Note  02/21/2021 1:27 PM Charles Orr  MRN:  [id number]  Subjective: Charles Orr reports, "I'm doing a good. I slept well last night. I have no complaints today".  Reason for admission: Patient is a 60 year old male who was transported to the [facility] emergency department on 02/13/2021 by emergency medical services secondary to suicidal ideation and hearing voices for 2 days.  He also endorsed confusion. Objective: Charles Orr is seen, chart reviewed,, case discussed  with the treatment team. He is in his room, sitting on his bed, eating his desert from lunch. He presents alert, oriented & aware of situation. He is visible on the unit, attending group sessions. He says he is feeling a lot better today. He denies hearing voices today.  He denies any SI, denies any plans or intent to hurt himself. Denies any HI today. He rates his depression as 2 & anxiety at 1. He is taking & tolerating his treatment regimen. He stated he felt like he has a headache and believes it is from his antidepressant. We discussed the side effects of Zoloft. I educated him that the side effects should go away after he has been taking the medication for a week or so. We also discussed taking Zoloft at bedtime rather than in the morning. He stated he would likely do that when he goes home. I will write his discharge prescription as "take at bedtime." Labs reviewed, no changes. The vital signs reviewed; all stable. Patient is in no apparent distress. Will continue current plan of care as already in progress. He says he slept well last night. Will continue current plan of care as already in progress. Patient requested that I call the manager at his apartment complex, [phone] and let her know he will be there shortly after noon tomorrow. The locks to his apartment have been changed and she will have the new keys for him. Attempted to call x 2, call goes to voice mail, unable to leave a message.   Principal  Problem: MDD (major depressive disorder), recurrent severe, without psychosis (HCC)  Diagnosis: Principal Problem:   MDD (major depressive disorder), recurrent severe, without psychosis (HCC)  Total Time spent with patient: 15 minutes  Past Psychiatric History: See H&P  Past Medical History:  Past Medical History:  Diagnosis Date   Angina pectoris (HCC) 07/09/2017   Anxiety    Arthritis    "back" (06/12/2017)   Bipolar disorder, curr episode mixed, severe, with psychotic features (HCC) 10/08/2017   CAP (community acquired pneumonia) 06/10/2017   Chest pain 07/10/2017   Chronic lower back pain    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   Community acquired pneumonia of right lower lobe of lung    Degenerative disc disease, lumbar    Depression    DM (diabetes mellitus) (HCC)    Fibromyalgia    GERD (gastroesophageal reflux disease)    High cholesterol    Hyperglycemia    Hypertension    Localized edema    Myocardial infarction (HCC) 2002   Osteoarthritis    Other schizophrenia (HCC)    Pneumonia 1989   Polysubstance (including opioids) dependence with physiological dependence (HCC) 10/06/2017   Schizoaffective disorder, bipolar type (HCC) 06/16/2017   Schizophrenia (HCC)    Shortness of breath 06/10/2017   Tobacco abuse 07/09/2017   Type II diabetes mellitus (HCC)     Past Surgical History:  Procedure Laterality Date   CARDIAC CATHETERIZATION  12/2006   /  notes 01/18/2011   INGUINAL HERNIA REPAIR Right    LEFT HEART CATH AND CORONARY ANGIOGRAPHY N/A 07/11/2017   Procedure: LEFT HEART CATH AND CORONARY ANGIOGRAPHY;  Surgeon: Tonny Bollmanooper, Michael, MD;  [facility];  Service: Cardiovascular;  Laterality: N/A;   PARATHYROIDECTOMY     PERICARDIAL TAP  2000   ?; in [place]/notes 01/18/2011   Family History:  Family History  Problem Relation Age of Onset   Hypertension Mother    Diabetes Mother    Heart disease Father     Hypertension Sister    Diabetes Sister    Hypertension Brother    Diabetes Brother    Hypertension Sister    Diabetes Sister    Hypertension Brother    Diabetes Brother    Family Psychiatric  History: See H&P  Social History:  Social History   Substance and Sexual Activity  Alcohol Use Yes   Comment: 06/12/2017 "stopped ~ 1 yr ago"      Social History   Substance and Sexual Activity  Drug Use Yes   Types: Cocaine   Comment: 06/12/2017 "stopped using cocaine ~ 1 yr ago; used marijuana when I was younger" last cocaine use 9 mths ago per pt on 06/15/17    Social History   Socioeconomic History   Marital status: Divorced    Spouse name: Not on file   Number of children: 1   Years of education: Not on file   Highest education level: GED or equivalent  Occupational History   Not on file  Tobacco Use   Smoking status: Every Day    Packs/day: 0.50    Years: 38.00    Pack years: 19.00    Types: Cigarettes   Smokeless tobacco: Current    Types: Chew  Vaping Use   Vaping Use: Never used  Substance and Sexual Activity   Alcohol use: Yes    Comment: 06/12/2017 "stopped ~ 1 yr ago"    Drug use: Yes    Types: Cocaine    Comment: 06/12/2017 "stopped using cocaine ~ 1 yr ago; used marijuana when I was younger" last cocaine use 9 mths ago per pt on 06/15/17   Sexual activity: Not Currently  Other Topics Concern   Not on file  Social History Narrative   Not on file   Social Determinants of Health   Financial Resource Strain: Not on file  Food Insecurity: Not on file  Transportation Needs: Not on file  Physical Activity: Not on file  Stress: Not on file  Social Connections: Not on file   Additional Social History:   Sleep: Good  Appetite:  Good  Current Medications: Current Facility-Administered Medications  Medication Dose Route Frequency Provider Last Rate Last Admin   acetaminophen (TYLENOL) tablet 650 mg  650 mg Oral Q6H PRN Rankin, Shuvon B, NP       alum &  mag hydroxide-simeth (MAALOX/MYLANTA) 200-200-20 MG/5ML suspension 30 mL  30 mL Oral Q4H PRN Rankin, Shuvon B, NP       atorvastatin (LIPITOR) tablet 80 mg  80 mg Oral Daily Laveda AbbeParks, Nehemias Sauceda Britton, NP   80 mg at 02/21/21 0759   busPIRone (BUSPAR) tablet 10 mg  10 mg Oral BID Laveda AbbeParks, Jocelin Schuelke Britton, NP   10 mg at 02/21/21 0800   cloNIDine (CATAPRES) tablet 0.1 mg  0.1 mg Oral BID Laveda AbbeParks, Hershal Eriksson Britton, NP   0.1 mg at 02/21/21 0800   gabapentin (NEURONTIN) capsule 300 mg  300 mg Oral TID Elta GuadeloupeParks, Rakeb Kibble  Micheline Rough, NP   300 mg at 02/21/21 1155   insulin aspart (novoLOG) injection 0-15 Units  0-15 Units Subcutaneous TID WC Antonieta Pert, MD   5 Units at 02/21/21 1157   insulin aspart (novoLOG) injection 15 Units  15 Units Subcutaneous TID WC Antonieta Pert, MD   15 Units at 02/21/21 1156   insulin glargine (LANTUS) injection 22 Units  22 Units Subcutaneous q AM Laveda Abbe, NP   22 Units at 02/21/21 0618   insulin glargine (LANTUS) injection 38 Units  38 Units Subcutaneous QHS Laveda Abbe, NP   38 Units at 02/20/21 2135   losartan (COZAAR) tablet 25 mg  25 mg Oral Daily Laveda Abbe, NP   25 mg at 02/21/21 0800   magnesium hydroxide (MILK OF MAGNESIA) suspension 30 mL  30 mL Oral Daily PRN Rankin, Shuvon B, NP       metFORMIN (GLUCOPHAGE) tablet 1,000 mg  1,000 mg Oral BID WC Laveda Abbe, NP   1,000 mg at 02/21/21 0801   risperiDONE (RISPERDAL M-TABS) disintegrating tablet 1 mg  1 mg Oral Daily Antonieta Pert, MD   1 mg at 02/21/21 0801   risperiDONE (RISPERDAL M-TABS) disintegrating tablet 2 mg  2 mg Oral QHS Antonieta Pert, MD   2 mg at 02/20/21 2134   sertraline (ZOLOFT) tablet 100 mg  100 mg Oral Daily Antonieta Pert, MD   100 mg at 02/21/21 0801   traZODone (DESYREL) tablet 100 mg  100 mg Oral QHS Laveda Abbe, NP   100 mg at 02/20/21 2134    Lab Results:  Results for orders placed or performed during the hospital encounter of  02/14/21 (from the past 48 hour(s))  Glucose, capillary     Status: Abnormal   Collection Time: 02/19/21  4:37 PM  Result Value Ref Range   Glucose-Capillary 192 (H) 70 - 99 mg/dL    Comment: Glucose reference range applies only to samples taken after fasting for at least 8 hours.  Glucose, capillary     Status: Abnormal   Collection Time: 02/19/21  9:42 PM  Result Value Ref Range   Glucose-Capillary 154 (H) 70 - 99 mg/dL    Comment: Glucose reference range applies only to samples taken after fasting for at least 8 hours.   Comment 1 Notify RN    Comment 2 Document in Chart   Glucose, capillary     Status: Abnormal   Collection Time: 02/20/21  5:42 AM  Result Value Ref Range   Glucose-Capillary 147 (H) 70 - 99 mg/dL    Comment: Glucose reference range applies only to samples taken after fasting for at least 8 hours.   Comment 1 Notify RN    Comment 2 Document in Chart   Glucose, capillary     Status: Abnormal   Collection Time: 02/20/21  7:35 AM  Result Value Ref Range   Glucose-Capillary 271 (H) 70 - 99 mg/dL    Comment: Glucose reference range applies only to samples taken after fasting for at least 8 hours.  Glucose, capillary     Status: Abnormal   Collection Time: 02/20/21 11:52 AM  Result Value Ref Range   Glucose-Capillary 196 (H) 70 - 99 mg/dL    Comment: Glucose reference range applies only to samples taken after fasting for at least 8 hours.   Comment 1 Notify RN   Glucose, capillary     Status: Abnormal   Collection Time: 02/20/21  4:36 PM  Result Value Ref Range   Glucose-Capillary 133 (H) 70 - 99 mg/dL    Comment: Glucose reference range applies only to samples taken after fasting for at least 8 hours.  Glucose, capillary     Status: Abnormal   Collection Time: 02/20/21  9:30 PM  Result Value Ref Range   Glucose-Capillary 248 (H) 70 - 99 mg/dL    Comment: Glucose reference range applies only to samples taken after fasting for at least 8 hours.   Comment 1 Notify  RN    Comment 2 Document in Chart   Glucose, capillary     Status: Abnormal   Collection Time: 02/21/21  5:43 AM  Result Value Ref Range   Glucose-Capillary 196 (H) 70 - 99 mg/dL    Comment: Glucose reference range applies only to samples taken after fasting for at least 8 hours.   Comment 1 Notify RN    Comment 2 Document in Chart   Glucose, capillary     Status: Abnormal   Collection Time: 02/21/21 11:48 AM  Result Value Ref Range   Glucose-Capillary 217 (H) 70 - 99 mg/dL    Comment: Glucose reference range applies only to samples taken after fasting for at least 8 hours.    Blood Alcohol level:  Lab Results  Component Value Date   ETH <10 02/13/2021   ETH <10 10/07/2017    Metabolic Disorder Labs: Lab Results  Component Value Date   HGBA1C 8.6 (H) 02/15/2021   MPG 200 02/15/2021   MPG 240.3 07/09/2017   No results found for: PROLACTIN Lab Results  Component Value Date   CHOL 125 02/15/2021   TRIG 64 02/15/2021   HDL 29 (L) 02/15/2021   CHOLHDL 4.3 02/15/2021   VLDL 13 02/15/2021   LDLCALC 83 02/15/2021   LDLCALC 43 06/10/2017   Physical Findings: AIMS: Facial and Oral Movements Muscles of Facial Expression: None, normal Lips and Perioral Area: None, normal Jaw: None, normal Tongue: None, normal,Extremity Movements Upper (arms, wrists, hands, fingers): None, normal Lower (legs, knees, ankles, toes): None, normal, Trunk Movements Neck, shoulders, hips: None, normal, Overall Severity Severity of abnormal movements (highest score from questions above): None, normal Incapacitation due to abnormal movements: None, normal Patient's awareness of abnormal movements (rate only patient's report): No Awareness, Dental Status Current problems with teeth and/or dentures?: No Does patient usually wear dentures?: No  CIWA:    COWS:     Musculoskeletal: Strength & Muscle Tone: within normal limits Gait & Station: normal Patient leans: N/A  Psychiatric Specialty  Exam:  Presentation  General Appearance: Fairly Groomed  Eye Contact:Good  Speech:Normal Rate; Clear and Coherent  Speech Volume:Normal  Handedness:Right  Mood and Affect  Mood:Depressed; Worthless  Affect:Appropriate; Non-Congruent  Thought Process  Thought Processes:Coherent; Linear; Goal Directed  Descriptions of Associations:Intact  Orientation:Full (Time, Place and Person)  Thought Content:Logical  History of Schizophrenia/Schizoaffective disorder:Yes  Duration of Psychotic Symptoms:Greater than six months  Hallucinations: Reports AH telling him he is worthless.  Ideas of Reference:Paranoia  Suicidal Thoughts: Reports passive SI, denies any plans or intent.  Homicidal Thoughts: Denies  Sensorium  Memory:Immediate Good; Recent Good; Remote Good  Judgment:Intact  Insight:Fair  Executive Functions  Concentration:Good  Attention Span:Good  Recall:Good  Fund of Knowledge:Fair  Language:Good  Psychomotor Activity  Psychomotor Activity: Normal  Assets  Assets:Desire for Improvement; Manufacturing systems engineer; Housing; Resilience  Sleep  Sleep: 6.75    Physical Exam: Physical Exam Vitals and nursing note reviewed.  HENT:  Head: Normocephalic.     Nose: Nose normal.     Mouth/Throat:     Pharynx: Oropharynx is clear.  Eyes:     Pupils: Pupils are equal, round, and reactive to light.  Cardiovascular:     Rate and Rhythm: Normal rate.     Pulses: Normal pulses.  Pulmonary:     Effort: Pulmonary effort is normal.  Genitourinary:    Comments: Deferred Musculoskeletal:        General: Normal range of motion.     Cervical back: Normal range of motion.  Skin:    General: Skin is warm and dry.  Neurological:     General: No focal deficit present.     Mental Status: He is alert and oriented to person, place, and time.   Review of Systems  Constitutional: Negative.   HENT: Negative.    Eyes: Negative.   Respiratory: Negative.     Cardiovascular: Negative.   Gastrointestinal: Negative.   Genitourinary: Negative.   Musculoskeletal: Negative.   Skin: Negative.   Neurological: Negative.   Endo/Heme/Allergies: Negative.   Psychiatric/Behavioral:  Positive for depression, hallucinations (Reports AH), substance abuse and suicidal ideas. The patient has insomnia. The patient is not nervous/anxious.   Blood pressure 136/76, pulse 78, temperature 97.9 F (36.6 C), temperature source Oral, resp. rate 16, height 6\' 2"  (1.88 m), weight 123.8 kg, SpO2 100 %. Body mass index is 35.05 kg/m.  Treatment Plan Summary: Daily contact with patient to assess and evaluate symptoms and progress in treatment and Medication management.   Continue inpatient hospitalization.  Will continue today 02/21/2021 plan as below except where it is noted.   Mood control. Continue Risperdal M-tab 1 mg po daily. Continue Risperdal M-tab 2 mg po Q hs.  Depression.  Continue Sertraline 100 mg po daily.   Anxiety. Continue Buspar 10 mg po bid.   Insomnia.  Continue Trazodone 100 mg po q hs.   Other medical issues: continue;  Lipitor 80 mg po Q bedtime for hyperlipidemia.  Clonidine 0.1 mg po bid for HTN.  Gabapentin 300 mg po tid for neuropathic pain. Sliding scale insulin tid with meals per BS result.  novoLog insulin 15 units subq tid with meals for DM.  Lantus insulin 22 units 38 units subQ Q am (07:00 am).  Lantus insulin 38 units subq q hs for DM. Losartan 25 mg po daily for HTN.  Metformin 1000 mg po bid for DM.  Continue the other prn medications as recommended for other medical complaints/symptoms. Encourage group participation Discharge planning is in progress. Patient will discharge home tomorrow. He will need to call his son at 30 for a ride home. His son works nearby and this is his availability as it is his lunch hour.   14, NP 02/21/2021, 1:27 PM

## 2021-02-21 NOTE — Progress Notes (Signed)
   02/21/21 1200  Psych Admission Type (Psych Patients Only)  Admission Status Voluntary  Psychosocial Assessment  Patient Complaints None  Eye Contact Fair  Facial Expression Pensive  Affect Appropriate to circumstance  Speech Logical/coherent  Interaction Assertive  Motor Activity Slow  Appearance/Hygiene In scrubs  Behavior Characteristics Cooperative  Mood Depressed  Thought Process  Coherency WDL  Content WDL  Delusions None reported or observed  Perception WDL  Hallucination None reported or observed  Judgment WDL  Confusion None  Danger to Self  Current suicidal ideation? Denies  Self-Injurious Behavior No self-injurious ideation or behavior indicators observed or expressed   Agreement Not to Harm Self Yes  Description of Agreement verbal contract  Danger to Others  Danger to Others None reported or observed

## 2021-02-21 NOTE — Tx Team (Signed)
Interdisciplinary Treatment and Diagnostic Plan Update  02/21/2021 Time of Session: 9:50am  Charles Orr MRN: [id number]  Principal Diagnosis: MDD (major depressive disorder), recurrent severe, without psychosis (HCC)  Secondary Diagnoses: Principal Problem:   MDD (major depressive disorder), recurrent severe, without psychosis (HCC)   Current Medications:  Current Facility-Administered Medications  Medication Dose Route Frequency Provider Last Rate Last Admin   acetaminophen (TYLENOL) tablet 650 mg  650 mg Oral Q6H PRN Rankin, Shuvon B, NP       alum & mag hydroxide-simeth (MAALOX/MYLANTA) 200-200-20 MG/5ML suspension 30 mL  30 mL Oral Q4H PRN Rankin, Shuvon B, NP       atorvastatin (LIPITOR) tablet 80 mg  80 mg Oral Daily Laveda Abbe, NP   80 mg at 02/21/21 0759   busPIRone (BUSPAR) tablet 10 mg  10 mg Oral BID Laveda Abbe, NP   10 mg at 02/21/21 0800   cloNIDine (CATAPRES) tablet 0.1 mg  0.1 mg Oral BID Laveda Abbe, NP   0.1 mg at 02/21/21 0800   gabapentin (NEURONTIN) capsule 300 mg  300 mg Oral TID Laveda Abbe, NP   300 mg at 02/21/21 0800   insulin aspart (novoLOG) injection 0-15 Units  0-15 Units Subcutaneous TID WC Antonieta Pert, MD   3 Units at 02/21/21 0622   insulin aspart (novoLOG) injection 15 Units  15 Units Subcutaneous TID WC Antonieta Pert, MD   15 Units at 02/21/21 0622   insulin glargine (LANTUS) injection 22 Units  22 Units Subcutaneous q AM Laveda Abbe, NP   22 Units at 02/21/21 0618   insulin glargine (LANTUS) injection 38 Units  38 Units Subcutaneous QHS Laveda Abbe, NP   38 Units at 02/20/21 2135   losartan (COZAAR) tablet 25 mg  25 mg Oral Daily Laveda Abbe, NP   25 mg at 02/21/21 0800   magnesium hydroxide (MILK OF MAGNESIA) suspension 30 mL  30 mL Oral Daily PRN Rankin, Shuvon B, NP       metFORMIN (GLUCOPHAGE) tablet 1,000 mg  1,000 mg Oral BID WC Laveda Abbe, NP   1,000 mg at  02/21/21 0801   risperiDONE (RISPERDAL M-TABS) disintegrating tablet 1 mg  1 mg Oral Daily Antonieta Pert, MD   1 mg at 02/21/21 0801   risperiDONE (RISPERDAL M-TABS) disintegrating tablet 2 mg  2 mg Oral QHS Antonieta Pert, MD   2 mg at 02/20/21 2134   sertraline (ZOLOFT) tablet 100 mg  100 mg Oral Daily Antonieta Pert, MD   100 mg at 02/21/21 0801   traZODone (DESYREL) tablet 100 mg  100 mg Oral QHS Laveda Abbe, NP   100 mg at 02/20/21 2134   PTA Medications: Medications Prior to Admission  Medication Sig Dispense Refill Last Dose   albuterol (PROVENTIL HFA;VENTOLIN HFA) 108 (90 Base) MCG/ACT inhaler Inhale 2 puffs into the lungs every 6 (six) hours as needed for wheezing or shortness of breath.      atorvastatin (LIPITOR) 80 MG tablet Take 80 mg by mouth at bedtime.  3    busPIRone (BUSPAR) 10 MG tablet Take 10 mg by mouth 2 (two) times daily.      cloNIDine (CATAPRES) 0.1 MG tablet Take 0.1 mg by mouth 2 (two) times daily.      dorzolamide-timolol (COSOPT) 22.3-6.8 MG/ML ophthalmic solution Place 1 drop into the right eye 2 (two) times daily.      ergocalciferol (VITAMIN D2) 1.25  MG (50000 UT) capsule Take 50,000 Units by mouth once a week.      gabapentin (NEURONTIN) 600 MG tablet Take 600 mg by mouth 3 (three) times daily.      HYDROcodone-acetaminophen (NORCO) 10-325 MG tablet Take 1 tablet by mouth every 6 (six) hours as needed for severe pain.      hydrOXYzine (VISTARIL) 50 MG capsule Take 100 mg by mouth 2 (two) times daily as needed for anxiety.      JANUVIA 50 MG tablet Take 50 mg by mouth daily.      LANTUS SOLOSTAR 100 UNIT/ML Solostar Pen Inject 2-38 Units into the skin 2 (two) times daily. 22 units in the morning, and 38 units at night.  3    losartan (COZAAR) 25 MG tablet Take 1 tablet (25 mg total) by mouth daily. 90 tablet 3    meclizine (ANTIVERT) 25 MG tablet Take 1 tablet (25 mg total) by mouth 3 (three) times daily as needed for dizziness. 30 tablet 0     metFORMIN (GLUCOPHAGE) 1000 MG tablet Take 1,000 mg by mouth 2 (two) times daily.      naproxen sodium (ALEVE) 220 MG tablet Take 220 mg by mouth daily as needed (Pain).      NOVOLOG FLEXPEN 100 UNIT/ML FlexPen Inject 20 Units into the skin 3 (three) times daily with meals.       oxyCODONE-acetaminophen (PERCOCET) 7.5-325 MG tablet Take 1 tablet by mouth every 6 (six) hours as needed for severe pain.      risperiDONE (RISPERDAL) 1 MG tablet Take 1 tablet (1 mg) by mouth in the mornings & 2 tablets (2 mg) at bedtime: For mood control (Patient taking differently: Take 1 mg by mouth 2 (two) times daily.) 90 tablet 0    traZODone (DESYREL) 100 MG tablet Take 100 mg by mouth at bedtime.       Patient Stressors: Loss of brother and mother Substance abuse  Patient Strengths: Ability for insight Average or above average intelligence Communication skills Supportive family/friends  Treatment Modalities: Medication Management, Group therapy, Case management,  1 to 1 session with clinician, Psychoeducation, Recreational therapy.   Physician Treatment Plan for Primary Diagnosis: MDD (major depressive disorder), recurrent severe, without psychosis (HCC) Long Term Goal(s): Improvement in symptoms so as ready for discharge   Short Term Goals: Ability to identify changes in lifestyle to reduce recurrence of condition will improve Ability to verbalize feelings will improve Ability to disclose and discuss suicidal ideas Ability to demonstrate self-control will improve Ability to identify and develop effective coping behaviors will improve Ability to maintain clinical measurements within normal limits will improve Compliance with prescribed medications will improve Ability to identify triggers associated with substance abuse/mental health issues will improve  Medication Management: Evaluate patient's response, side effects, and tolerance of medication regimen.  Therapeutic Interventions: 1 to 1  sessions, Unit Group sessions and Medication administration.  Evaluation of Outcomes: Progressing  Physician Treatment Plan for Secondary Diagnosis: Principal Problem:   MDD (major depressive disorder), recurrent severe, without psychosis (HCC)  Long Term Goal(s): Improvement in symptoms so as ready for discharge   Short Term Goals: Ability to identify changes in lifestyle to reduce recurrence of condition will improve Ability to verbalize feelings will improve Ability to disclose and discuss suicidal ideas Ability to demonstrate self-control will improve Ability to identify and develop effective coping behaviors will improve Ability to maintain clinical measurements within normal limits will improve Compliance with prescribed medications will improve Ability to identify  triggers associated with substance abuse/mental health issues will improve     Medication Management: Evaluate patient's response, side effects, and tolerance of medication regimen.  Therapeutic Interventions: 1 to 1 sessions, Unit Group sessions and Medication administration.  Evaluation of Outcomes: Progressing   RN Treatment Plan for Primary Diagnosis: MDD (major depressive disorder), recurrent severe, without psychosis (HCC) Long Term Goal(s): Knowledge of disease and therapeutic regimen to maintain health will improve  Short Term Goals: Ability to remain free from injury will improve, Ability to demonstrate self-control, Ability to participate in decision making will improve, Ability to verbalize feelings will improve, Ability to disclose and discuss suicidal ideas, and Ability to identify and develop effective coping behaviors will improve  Medication Management: RN will administer medications as ordered by provider, will assess and evaluate patient's response and provide education to patient for prescribed medication. RN will report any adverse and/or side effects to prescribing provider.  Therapeutic  Interventions: 1 on 1 counseling sessions, Psychoeducation, Medication administration, Evaluate responses to treatment, Monitor vital signs and CBGs as ordered, Perform/monitor CIWA, COWS, AIMS and Fall Risk screenings as ordered, Perform wound care treatments as ordered.  Evaluation of Outcomes: Progressing   LCSW Treatment Plan for Primary Diagnosis: MDD (major depressive disorder), recurrent severe, without psychosis (HCC) Long Term Goal(s): Safe transition to appropriate next level of care at discharge, Engage patient in therapeutic group addressing interpersonal concerns.  Short Term Goals: Engage patient in aftercare planning with referrals and resources, Increase social support, Increase emotional regulation, Facilitate acceptance of mental health diagnosis and concerns, Facilitate patient progression through stages of change regarding substance use diagnoses and concerns, Identify triggers associated with mental health/substance abuse issues, and Increase skills for wellness and recovery  Therapeutic Interventions: Assess for all discharge needs, 1 to 1 time with Social worker, Explore available resources and support systems, Assess for adequacy in community support network, Educate family and significant other(s) on suicide prevention, Complete Psychosocial Assessment, Interpersonal group therapy.  Evaluation of Outcomes: Progressing   Progress in Treatment: Attending groups: Yes. Participating in groups: Yes. Taking medication as prescribed: Yes. Toleration medication: Yes. Family/Significant other contact made: Yes, individual(s) contacted:  Brother  Patient understands diagnosis: No. Discussing patient identified problems/goals with staff: Yes. Medical problems stabilized or resolved: Yes. Denies suicidal/homicidal ideation: Yes. Issues/concerns per patient self-inventory: No.   New problem(s) identified: No, Describe:  None   New Short Term/Long Term Goal(s): medication  stabilization, elimination of SI thoughts, development of comprehensive mental wellness plan.   Patient Goals:  "To get better"   Discharge Plan or Barriers: Patient recently admitted. CSW will continue to follow and assess for appropriate referrals and possible discharge planning.   Reason for Continuation of Hospitalization: Depression Hallucinations Medication stabilization Suicidal ideation  Estimated Length of Stay: 3 to 5 days   Attendees: Patient: Charles Orr 02/21/2021   Physician: Landry Mellow, MD 02/21/2021   Nursing:  02/21/2021   RN Care Manager: 02/21/2021   Social Worker: Melba Coon, LCSWA  02/21/2021   Recreational Therapist:  02/21/2021   Other:  02/21/2021   Other:  02/21/2021   Other: 02/21/2021     Scribe for Treatment Team: Felizardo Hoffmann, LCSWA 02/21/2021 10:46 AM

## 2021-02-21 NOTE — B[facility] Group Notes (Signed)
Type of Therapy and Topic:  Group Therapy - Healthy vs Unhealthy Coping Skills  Participation Level:  Did Not Attend   Description of Group The focus of this group was to determine what unhealthy coping techniques typically are used by group members and what healthy coping techniques would be helpful in coping with various problems. Patients were guided in becoming aware of the differences between healthy and unhealthy coping techniques. Patients were asked to identify 2-3 healthy coping skills they would like to learn to use more effectively.  Therapeutic Goals Patients learned that coping is what human beings do all day long to deal with various situations in their lives Patients defined and discussed healthy vs unhealthy coping techniques Patients identified their preferred coping techniques and identified whether these were healthy or unhealthy Patients determined 2-3 healthy coping skills they would like to become more familiar with and use more often. Patients provided support and ideas to each other   Summary of Patient Progress:  Did not attend    Therapeutic Modalities Cognitive Behavioral Therapy Motivational Interviewing 

## 2021-02-22 LAB — GLUCOSE, CAPILLARY
Glucose-Capillary: 115 mg/dL — ABNORMAL HIGH (ref 70–99)
Glucose-Capillary: 146 mg/dL — ABNORMAL HIGH (ref 70–99)

## 2021-02-22 NOTE — B[facility] Group Notes (Signed)
Patient did not attend group   ADULT GRIEF GROUP NOTE:   Spiritual care group on grief and loss facilitated by chaplain Katy Keith Felten, [facility]   Group Goal:   Support / Education around grief and loss   Members engage in facilitated group support and psycho-social education.   Group Description:   Following introductions and group rules, group members engaged in facilitated group dialog and support around topic of loss, with particular support around experiences of loss in their lives. Group Identified types of loss (relationships / self / things) and identified patterns, circumstances, and changes that precipitate losses. Reflected on thoughts / feelings around loss, normalized grief responses, and recognized variety in grief experience. Group noted Worden's four tasks of grief in discussion.   Group drew on Adlerian / Rogerian, narrative, MI,    

## 2021-02-22 NOTE — Plan of Care (Signed)
  Problem: Self-Concept: Goal: Level of anxiety will decrease Outcome: Progressing   Problem: Coping: Goal: Coping ability will improve Outcome: Progressing Goal: Will verbalize feelings Outcome: Progressing

## 2021-02-22 NOTE — Progress Notes (Signed)
  [facility] Adult Case Management Discharge Plan :  Will you be returning to the same living situation after discharge:  Yes,  Home  At discharge, do you have transportation home?: Yes,  Son  Do you have the ability to pay for your medications: Yes,  Medicaid   Release of information consent forms completed and in the chart;  Patient's signature needed at discharge.  Patient to Follow up at:  Follow-up Information     [facility] Follow up.   Specialty: Behavioral Health Why: Please go to this provider for therapy and medication management services.  Walk in hours are Monday through Wednesday, 8:00 am to 11:00 am.  Services are available first come, first served. Contact information: [phone] [place] [address] [phone]                Next level of care provider has access to [facility] Link:yes  Safety Planning and Suicide Prevention discussed: Yes,  with patient and brother      Has patient been referred to the Quitline?: Patient refused referral  Patient has been referred for addiction treatment: N/A  Aram Beecham, LCSWA 02/22/2021, 10:37 AM

## 2021-02-22 NOTE — Progress Notes (Addendum)
  D:  Pt presents mildly anxious today.  Pt reports he is going home tomorrow, 02/22/21 around 12:00 noon.  Pt reports his son will be picking him up.  Pt denies SI/HI and verbally contracts for safety should thoughts arise.  Pt denies AVH.  A:  Labs/Vitals monitored; Medication education provided; Pt encouraged to communicate concerns.  R:  Pt remains safe on unit with Q 15 minute safety checks.  Will continue POC.    02/21/21 2115  Psych Admission Type (Psych Patients Only)  Admission Status Voluntary  Psychosocial Assessment  Patient Complaints None  Eye Contact Fair  Facial Expression Pensive  Affect Appropriate to circumstance  Speech Logical/coherent  Interaction Assertive  Motor Activity Slow  Appearance/Hygiene In scrubs  Behavior Characteristics Cooperative  Mood Depressed  Thought Process  Coherency WDL  Content WDL  Delusions None reported or observed  Perception WDL  Hallucination None reported or observed  Judgment WDL  Confusion None  Danger to Self  Current suicidal ideation? Denies  Self-Injurious Behavior No self-injurious ideation or behavior indicators observed or expressed   Agreement Not to Harm Self Yes  Description of Agreement verbal contract  Danger to Others  Danger to Others None reported or observed

## 2021-02-22 NOTE — Progress Notes (Signed)
   02/22/21 0538  Vital Signs  Temp 97.9 F (36.6 C)  Temp Source Oral  Pulse Rate 77  BP 125/84  BP Location Left Arm  BP Method Automatic  Patient Position (if appropriate) Standing  Oxygen Therapy  SpO2 96 %   D: Patient denies SI/HI/AVH. Pt. Denies both anxiety and depression.  A:  Patient took scheduled medicine.  Support and encouragement provided Routine safety checks conducted every 15 minutes. Patient  Informed to notify staff with any concerns.   R:   Safety maintained.

## 2021-02-22 NOTE — Progress Notes (Signed)
Discharge Note:  Patient denies SI/HI AVH at this time. Discharge instructions, AVS, prescriptions and transition record gone over with patient. Patient agrees to comply with medication management, follow-up visit, and outpatient therapy. Patient belongings returned to patient. Patient questions and concerns addressed and answered.  Patient ambulatory off unit.  Patient discharged to home.   

## 2021-03-31 ENCOUNTER — Other Ambulatory Visit (HOSPITAL_COMMUNITY): Payer: Self-pay | Admitting: Psychiatry

## 2021-04-29 ENCOUNTER — Other Ambulatory Visit (HOSPITAL_COMMUNITY): Payer: Self-pay | Admitting: Psychiatry

## 2021-08-05 ENCOUNTER — Other Ambulatory Visit: Payer: Self-pay

## 2021-08-05 ENCOUNTER — Encounter (HOSPITAL_COMMUNITY): Payer: Self-pay | Admitting: Emergency Medicine

## 2021-08-05 ENCOUNTER — Emergency Department (HOSPITAL_COMMUNITY)
Admission: EM | Admit: 2021-08-05 | Discharge: 2021-08-06 | Disposition: A | Discharge status: Home / Self Care | Payer: Medicaid Other | Attending: Emergency Medicine | Admitting: Emergency Medicine

## 2021-08-05 DIAGNOSIS — I10 Essential (primary) hypertension: Secondary | ICD-10-CM | POA: Diagnosis not present

## 2021-08-05 DIAGNOSIS — R410 Disorientation, unspecified: Secondary | ICD-10-CM | POA: Diagnosis present

## 2021-08-05 DIAGNOSIS — E162 Hypoglycemia, unspecified: Secondary | ICD-10-CM | POA: Insufficient documentation

## 2021-08-05 DIAGNOSIS — Z5321 Procedure and treatment not carried out due to patient leaving prior to being seen by health care provider: Secondary | ICD-10-CM | POA: Diagnosis not present

## 2021-08-05 LAB — BASIC METABOLIC PANEL
Anion gap: 8 (ref 5–15)
BUN: 10 mg/dL (ref 6–20)
CO2: 25 mmol/L (ref 22–32)
Calcium: 9.2 mg/dL (ref 8.9–10.3)
Chloride: 103 mmol/L (ref 98–111)
Creatinine, Ser: 1.02 mg/dL (ref 0.61–1.24)
GFR, Estimated: >60 mL/min (ref >60)
Glucose, Bld: 167 mg/dL — ABNORMAL HIGH (ref 70–99)
Potassium: 3.5 mmol/L (ref 3.5–5.1)
Sodium: 136 mmol/L (ref 135–145)

## 2021-08-05 LAB — CBC
HCT: 44.4 % (ref 39.0–52.0)
Hemoglobin: 14.8 g/dL (ref 13.0–17.0)
MCH: 26.6 pg (ref 26.0–34.0)
MCHC: 33.3 g/dL (ref 30.0–36.0)
MCV: 79.7 fL — ABNORMAL LOW (ref 80.0–100.0)
Platelets: 262 10*3/uL (ref 150–400)
RBC: 5.57 MIL/uL (ref 4.22–5.81)
RDW: 13.2 % (ref 11.5–15.5)
WBC: 11.5 10*3/uL — ABNORMAL HIGH (ref 4.0–10.5)
nRBC: 0 % (ref 0.0–0.2)

## 2021-08-05 LAB — CBG MONITORING, ED: Glucose-Capillary: 145 mg/dL — ABNORMAL HIGH (ref 70–99)

## 2021-08-05 NOTE — ED Triage Notes (Addendum)
Pt BIB [facility] with multiple complaints. Pt reports intermittent confusion today, hypoglycemia and hypertension. EMS CBG 70s, given oral glucose improved to 110s. Pt states that he ate a meal this morning. Alert to self and place, unsure of year.

## 2022-05-21 ENCOUNTER — Emergency Department (HOSPITAL_COMMUNITY): Payer: Medicaid Other

## 2022-05-21 ENCOUNTER — Emergency Department (HOSPITAL_COMMUNITY)
Admission: EM | Admit: 2022-05-21 | Discharge: 2022-05-21 | Disposition: A | Discharge status: Home / Self Care | Payer: Medicaid Other | Attending: Emergency Medicine | Admitting: Emergency Medicine

## 2022-05-21 ENCOUNTER — Other Ambulatory Visit: Payer: Self-pay

## 2022-05-21 ENCOUNTER — Encounter (HOSPITAL_COMMUNITY): Payer: Self-pay

## 2022-05-21 DIAGNOSIS — R42 Dizziness and giddiness: Secondary | ICD-10-CM

## 2022-05-21 DIAGNOSIS — R739 Hyperglycemia, unspecified: Secondary | ICD-10-CM

## 2022-05-21 DIAGNOSIS — E1140 Type 2 diabetes mellitus with diabetic neuropathy, unspecified: Secondary | ICD-10-CM | POA: Diagnosis not present

## 2022-05-21 DIAGNOSIS — Z79899 Other long term (current) drug therapy: Secondary | ICD-10-CM | POA: Diagnosis not present

## 2022-05-21 DIAGNOSIS — Z794 Long term (current) use of insulin: Secondary | ICD-10-CM | POA: Diagnosis not present

## 2022-05-21 DIAGNOSIS — F17200 Nicotine dependence, unspecified, uncomplicated: Secondary | ICD-10-CM | POA: Insufficient documentation

## 2022-05-21 DIAGNOSIS — I10 Essential (primary) hypertension: Secondary | ICD-10-CM | POA: Insufficient documentation

## 2022-05-21 DIAGNOSIS — Z7984 Long term (current) use of oral hypoglycemic drugs: Secondary | ICD-10-CM | POA: Insufficient documentation

## 2022-05-21 DIAGNOSIS — E1165 Type 2 diabetes mellitus with hyperglycemia: Secondary | ICD-10-CM | POA: Insufficient documentation

## 2022-05-21 DIAGNOSIS — N179 Acute kidney failure, unspecified: Secondary | ICD-10-CM

## 2022-05-21 LAB — URINALYSIS, ROUTINE W REFLEX MICROSCOPIC
Bilirubin Urine: NEGATIVE
Glucose, UA: >=500 mg/dL — AB
Hgb urine dipstick: NEGATIVE
Ketones, ur: NEGATIVE mg/dL
Leukocytes,Ua: NEGATIVE
Nitrite: NEGATIVE
Protein, ur: 30 mg/dL — AB
Specific Gravity, Urine: 1.023 (ref 1.005–1.030)
pH: 6 (ref 5.0–8.0)

## 2022-05-21 LAB — RAPID URINE DRUG SCREEN, HOSP PERFORMED
Amphetamines: NOT DETECTED
Barbiturates: NOT DETECTED
Benzodiazepines: NOT DETECTED
Cocaine: NOT DETECTED
Opiates: NOT DETECTED
Tetrahydrocannabinol: NOT DETECTED

## 2022-05-21 LAB — TROPONIN I (HIGH SENSITIVITY)
Troponin I (High Sensitivity): 6 ng/L (ref <18)
Troponin I (High Sensitivity): 6 ng/L (ref <18)

## 2022-05-21 LAB — CBC
HCT: 42.3 % (ref 39.0–52.0)
Hemoglobin: 14.3 g/dL (ref 13.0–17.0)
MCH: 26.6 pg (ref 26.0–34.0)
MCHC: 33.8 g/dL (ref 30.0–36.0)
MCV: 78.6 fL — ABNORMAL LOW (ref 80.0–100.0)
Platelets: 247 10*3/uL (ref 150–400)
RBC: 5.38 MIL/uL (ref 4.22–5.81)
RDW: 14 % (ref 11.5–15.5)
WBC: 8.6 10*3/uL (ref 4.0–10.5)
nRBC: 0 % (ref 0.0–0.2)

## 2022-05-21 LAB — BASIC METABOLIC PANEL
Anion gap: 8 (ref 5–15)
BUN: 20 mg/dL (ref 6–20)
CO2: 23 mmol/L (ref 22–32)
Calcium: 9.1 mg/dL (ref 8.9–10.3)
Chloride: 106 mmol/L (ref 98–111)
Creatinine, Ser: 1.62 mg/dL — ABNORMAL HIGH (ref 0.61–1.24)
GFR, Estimated: 48 mL/min — ABNORMAL LOW (ref >60)
Glucose, Bld: 129 mg/dL — ABNORMAL HIGH (ref 70–99)
Potassium: 4.1 mmol/L (ref 3.5–5.1)
Sodium: 137 mmol/L (ref 135–145)

## 2022-05-21 LAB — CBG MONITORING, ED: Glucose-Capillary: 134 mg/dL — ABNORMAL HIGH (ref 70–99)

## 2022-05-21 MED ORDER — SODIUM CHLORIDE 0.9 % IV BOLUS
1000.0000 mL | Freq: Once | INTRAVENOUS | Status: AC
  Administered 2022-05-21: 1000 mL via INTRAVENOUS

## 2022-05-21 NOTE — ED Provider Notes (Signed)
Care assumed from previous provider PA Christian Prosperi, [place]. Please see note for further details. In short patient is a 61 year old male who presented to the emergency department with dizziness.  He had a history of type 2 diabetes and noted that his blood sugar was low.  He then ate a bowl of cereal and subsequently took more insulin.  His work-up is revealing of an AKI and CT and UA are pending at shift change.  Plan is for me to dispo accordingly.  Physical Exam  BP (!) 139/93   Pulse 67   Temp (!) 97 F (36.1 C) (Axillary)   Resp (!) 27   Ht 6\' 2"  (1.88 m)   Wt 119.7 kg   SpO2 98%   BMI 33.90 kg/m   Physical Exam Vitals and nursing note reviewed.  Constitutional:      Appearance: Normal appearance.  HENT:     Head: Normocephalic and atraumatic.  Eyes:     General: No scleral icterus.    Conjunctiva/sclera: Conjunctivae normal.  Pulmonary:     Effort: Pulmonary effort is normal. No respiratory distress.  Skin:    Findings: No rash.  Neurological:     Mental Status: He is alert.  Psychiatric:        Mood and Affect: Mood normal.     Procedures  Procedures  ED Course / MDM    Medical Decision Making Amount and/or Complexity of Data Reviewed Labs: ordered. Radiology: ordered.   CT head negative.  UA without signs of infection but with expected glucosuria.  UDS negative.  At this time I believe patient is stable for discharge home.  He has been requesting discharge for the past 30 minutes.  I believe this is reasonable and he will follow-up outpatient with his PCP about his AKI.  He is agreeable to plan.  Stable for discharge.       Darliss Ridgel 05/21/22 [place], Ankit, MD 05/21/22 1815

## 2022-05-21 NOTE — ED Notes (Signed)
Patient seen ambulating to and from the bed to the door and reports that he will call  a taxi to get home with. Patient on the phone with a friend upon discharge. Per primary RN patient did not need a wheelchair to exit to the lobby but patient was provided with such anyway and patient verbalizes understanding of the d/c instruction he has in hand

## 2022-05-21 NOTE — ED Provider Notes (Signed)
[facility] EMERGENCY DEPARTMENT Provider Note   CSN: [id number] Arrival date & time: 05/21/22  1323     History  Chief Complaint  Patient presents with   Dizziness    Patient brought in from home. Patient thought his sugar was low so he took 16u of insulin and then ate a bowl of sugar covered cheerios. Patient states dizziness.    Charles Orr is a 61 y.o. male with extensive past medical history including functional blindness, hypertension, lipidemia, diabetes, peripheral neuropathy, fibromyalgia, schizophrenia, tobacco use disorder bipolar disorder, polysubstance abuse who presents with concern for dizziness versus near syncope.  Patient was brought in from home, he woke up this morning and felt a little lightheaded or weak and thought that his blood sugar might be low, patient ate a bowl of Cheerios covered in sugar to help to raise his blood sugar before taking 16 units of insulin.  He denies any unilateral numbness, loss of consciousness, facial droop, confusion, previous history of stroke.   Dizziness      Home Medications Prior to Admission medications   Medication Sig Start Date End Date Taking? Authorizing Provider  albuterol (PROVENTIL HFA;VENTOLIN HFA) 108 (90 Base) MCG/ACT inhaler Inhale 2 puffs into the lungs every 6 (six) hours as needed for wheezing or shortness of breath.    [provider]  atorvastatin (LIPITOR) 80 MG tablet Take 80 mg by mouth at bedtime. 06/07/18   [provider]  busPIRone (BUSPAR) 10 MG tablet Take 1 tablet (10 mg total) by mouth 2 (two) times daily. 02/21/21   Sharma Covert, MD  cloNIDine (CATAPRES) 0.1 MG tablet Take 0.1 mg by mouth 2 (two) times daily. 02/04/20   [provider]  dorzolamide-timolol (COSOPT) 22.3-6.8 MG/ML ophthalmic solution Place 1 drop into the right eye 2 (two) times daily.    [provider]  ergocalciferol (VITAMIN D2) 1.25 MG (50000 UT) capsule Take 50,000 Units by  mouth once a week.    [provider]  gabapentin (NEURONTIN) 300 MG capsule Take 1 capsule (300 mg total) by mouth 3 (three) times daily. 02/21/21   Sharma Covert, MD  insulin glargine (LANTUS) 100 UNIT/ML injection Inject 0.22 mLs (22 Units total) into the skin in the morning. 02/22/21   Mallie Darting, Cordie Grice, MD  insulin glargine (LANTUS) 100 UNIT/ML injection Inject 0.38 mLs (38 Units total) into the skin at bedtime. 02/21/21   Sharma Covert, MD  JANUVIA 50 MG tablet Take 50 mg by mouth daily. 02/01/21   [provider]  losartan (COZAAR) 25 MG tablet Take 1 tablet (25 mg total) by mouth daily. 12/31/18 02/13/21  Sueanne Margarita, MD  metFORMIN (GLUCOPHAGE) 1000 MG tablet Take 1,000 mg by mouth 2 (two) times daily. 01/18/21   [provider]  risperiDONE (RISPERDAL) 1 MG tablet Take 1 tablet (1 mg) by mouth in the mornings & 2 tablets (2 mg) at bedtime: For mood control 02/21/21   Sharma Covert, MD  sertraline (ZOLOFT) 100 MG tablet Take 1 tablet (100 mg total) by mouth daily. 02/22/21   Sharma Covert, MD  traZODone (DESYREL) 100 MG tablet Take 100 mg by mouth at bedtime. 02/01/21   [provider]      Allergies    Patient has no known allergies.    Review of Systems   Review of Systems  Neurological:  Positive for dizziness.  All other systems reviewed and are negative.   Physical Exam Updated  Vital Signs BP (!) 139/93   Pulse 67   Temp (!) 97 F (36.1 C) (Axillary)   Resp (!) 27   Ht 6\' 2"  (1.88 m)   Wt 119.7 kg   SpO2 98%   BMI 33.90 kg/m  Physical Exam Vitals and nursing note reviewed.  Constitutional:      General: He is not in acute distress.    Appearance: Normal appearance.  HENT:     Head: Normocephalic and atraumatic.  Eyes:     General:        Right eye: No discharge.        Left eye: No discharge.  Cardiovascular:     Rate and Rhythm: Normal rate and regular rhythm.     Heart sounds: No murmur heard.    No  friction rub. No gallop.  Pulmonary:     Effort: Pulmonary effort is normal.     Breath sounds: Normal breath sounds.  Abdominal:     General: Bowel sounds are normal.     Palpations: Abdomen is soft.  Skin:    General: Skin is warm and dry.     Capillary Refill: Capillary refill takes less than 2 seconds.  Neurological:     Mental Status: He is alert and oriented to person, place, and time.     Comments: Cranial nerves II through XII grossly intact, visual acuity not assessed secondary to functional blindness, but EOMs intact, pupils are equal round reactive to light.  Intact finger-nose, intact heel-to-shin.  Romberg and gait deferred secondary to functional blindness. alert and oriented x3.  Moves all 4 limbs spontaneously, normal coordination.  No pronator drift.  Intact strength 5 out of 5 bilateral upper and lower extremities.    Psychiatric:        Mood and Affect: Mood normal.        Behavior: Behavior normal.     ED Results / Procedures / Treatments   Labs (all labs ordered are listed, but only abnormal results are displayed) Labs Reviewed  CBC - Abnormal; Notable for the following components:      Result Value   MCV 78.6 (*)    All other components within normal limits  BASIC METABOLIC PANEL - Abnormal; Notable for the following components:   Glucose, Bld 129 (*)    Creatinine, Ser 1.62 (*)    GFR, Estimated 48 (*)    All other components within normal limits  CBG MONITORING, ED - Abnormal; Notable for the following components:   Glucose-Capillary 134 (*)    All other components within normal limits  URINALYSIS, ROUTINE W REFLEX MICROSCOPIC  RAPID URINE DRUG SCREEN, HOSP PERFORMED  TROPONIN I (HIGH SENSITIVITY)  TROPONIN I (HIGH SENSITIVITY)    EKG EKG Interpretation  Date/Time:  Sunday May 21 2022 13:31:14 EDT Ventricular Rate:  66 PR Interval:  175 QRS Duration: 81 QT Interval:  430 QTC Calculation: 451 R Axis:   63 Text Interpretation: Sinus  rhythm Consider left atrial enlargement Borderline ST depression, inferior leads and lateral leads ST elevation, consider anterolateral injury Nonspecific ST and T wave abnormality Confirmed by Varney Biles [phone]) on 05/21/2022 2:39:10 PM  Radiology CT Head Wo Contrast  Result Date: 05/21/2022 CLINICAL DATA:  Mental status change of unknown cause. EXAM: CT HEAD WITHOUT CONTRAST TECHNIQUE: Contiguous axial images were obtained from the base of the skull through the vertex without intravenous contrast. RADIATION DOSE REDUCTION: This exam was performed according to the departmental dose-optimization program which includes automated exposure  control, adjustment of the mA and/or kV according to patient size and/or use of iterative reconstruction technique. COMPARISON:  02/13/2021. FINDINGS: Brain: No evidence of acute infarction, hemorrhage, hydrocephalus, extra-axial collection or mass lesion/mass effect. Vascular: No hyperdense vessel or unexpected calcification. Skull: Normal. Negative for fracture or focal lesion. Sinuses/Orbits: No acute abnormality of either globe or orbit. Chronic left globe changes, stable. Visualized sinuses are clear. Other: None. IMPRESSION: 1. No acute intracranial abnormalities. Stable appearance from the prior head CT. Electronically Signed   By: Lajean Manes M.D.   On: 05/21/2022 16:07   DG Chest 2 View  Result Date: 05/21/2022 CLINICAL DATA:  Short of breath.  Dizziness. EXAM: CHEST - 2 VIEW COMPARISON:  07/17/2020. FINDINGS: Cardiac silhouette is normal in size. Normal mediastinal and hilar contours. Clear lungs.  No pleural effusion or pneumothorax. Skeletal structures are intact. IMPRESSION: No active cardiopulmonary disease. Electronically Signed   By: Lajean Manes M.D.   On: 05/21/2022 14:35    Procedures Procedures    Medications Ordered in ED Medications  sodium chloride 0.9 % bolus 1,000 mL (1,000 mLs Intravenous New Bag/Given 05/21/22 1523)    ED Course/  Medical Decision Making/ A&P                           Medical Decision Making Amount and/or Complexity of Data Reviewed Labs: ordered. Radiology: ordered.   This patient is a 61 y.o. male who presents to the ED for concern of lightheadedness, dizziness, "feeling bad", this involves an extensive number of treatment options, and is a complaint that carries with it a high risk of complications and morbidity. The emergent differential diagnosis prior to evaluation includes, but is not limited to stroke, TIA, hypoglycemia, dehydration, electrolyte abnormality, peripheral or central vertigo   This is not an exhaustive differential.   Past Medical History / Co-morbidities / Social History: Pneumonia, depression, tobacco abuse, HTN, obesity, HLD, cocaine abuse, schizophrenia, bipolar  Additional history: Chart reviewed. Pertinent results include: reviewed previous ed visits including labwork, imagine, some not remote cocaine use documented in past  Physical Exam: Physical exam performed. The pertinent findings include: patient functionally blind, some neurologic incontinence not assessed secondary to his baseline status, however he does not show any significant focal deficits on my neurologic exam other than his blindness, and some feeling of "lightheadedness on standing  Lab Tests: I ordered, and personally interpreted labs.  The pertinent results include: CBC overall unremarkable other than slight microcytic quality of red blood cells, BMP notable for mild hyperglycemia, glucose 129, no hypoglycemia after supplements.  Insulin without checking blood sugar.  Patient does have evidence of acute kidney injury with creatinine 1.62 from baseline around 1.0 previously.  Troponin is negative x1 in context of no active chest pain.   Imaging Studies: I ordered imaging studies including plain film chest x-ray, CT head without contrast. I independently visualized and interpreted imaging which showed no  acute intrathoracic or intracranial abnormality. I agree with the radiologist interpretation.   Cardiac Monitoring:  The patient was maintained on a cardiac monitor.  My attending physician Dr. Concepcion Living viewed and interpreted the cardiac monitored which showed an underlying rhythm of: Nonspecific ST, T wave abnormality. I agree with this interpretation.   Medications: I ordered medication including fluid bolus for dehydration.  Patient will require reevaluation by oncoming team.  4:16 PM Care of Jodie Echevaria transferred to [place] and Dr. Kathrynn Humble at the end of my  shift as the patient will require reassessment once labs/imaging have resulted. Patient presentation, ED course, and plan of care discussed with review of all pertinent labs and imaging. Please see his/her note for further details regarding further ED course and disposition. Plan at time of handoff is pending UA, UDS, reevaluation of patient's symptoms after fluid bolus, could consider admission for this patient for vague new dizziness, lightheadedness, and new AKI for additional fluid work-up, possible MRI, versus outpatient work-up by PCP, creatinine recheck, will depend on patient feeling on reevaluation. This may be altered or completely changed at the discretion of the oncoming team pending results of further workup.   Final Clinical Impression(s) / ED Diagnoses Final diagnoses:  None    Rx / DC Orders ED Discharge Orders     None         Dorien Chihuahua 05/21/22 Butler, Ankit, MD 05/21/22 1815

## 2022-05-21 NOTE — Discharge Instructions (Addendum)
Please follow-up with your primary care provider for recheck of your labs.  Return with any worsening or recurring symptoms.  It was a pleasure to meet you and we hope you feel better!

## 2022-07-10 ENCOUNTER — Emergency Department (HOSPITAL_COMMUNITY): Payer: Medicaid Other

## 2022-07-10 ENCOUNTER — Encounter (HOSPITAL_COMMUNITY): Payer: Self-pay | Admitting: Emergency Medicine

## 2022-07-10 ENCOUNTER — Inpatient Hospital Stay (HOSPITAL_COMMUNITY)
Admission: EM | Admit: 2022-07-10 | Discharge: 2022-07-13 | DRG: 066 | Disposition: A | Discharge status: Home / Self Care | Payer: Medicaid Other | Attending: Family Medicine | Admitting: Family Medicine

## 2022-07-10 DIAGNOSIS — E1165 Type 2 diabetes mellitus with hyperglycemia: Secondary | ICD-10-CM | POA: Diagnosis present

## 2022-07-10 DIAGNOSIS — E119 Type 2 diabetes mellitus without complications: Secondary | ICD-10-CM

## 2022-07-10 DIAGNOSIS — F250 Schizoaffective disorder, bipolar type: Secondary | ICD-10-CM | POA: Diagnosis present

## 2022-07-10 DIAGNOSIS — R079 Chest pain, unspecified: Secondary | ICD-10-CM | POA: Diagnosis present

## 2022-07-10 DIAGNOSIS — K219 Gastro-esophageal reflux disease without esophagitis: Secondary | ICD-10-CM | POA: Diagnosis present

## 2022-07-10 DIAGNOSIS — F17210 Nicotine dependence, cigarettes, uncomplicated: Secondary | ICD-10-CM | POA: Diagnosis present

## 2022-07-10 DIAGNOSIS — I6381 Other cerebral infarction due to occlusion or stenosis of small artery: Principal | ICD-10-CM | POA: Diagnosis present

## 2022-07-10 DIAGNOSIS — M797 Fibromyalgia: Secondary | ICD-10-CM | POA: Diagnosis present

## 2022-07-10 DIAGNOSIS — E785 Hyperlipidemia, unspecified: Secondary | ICD-10-CM | POA: Diagnosis present

## 2022-07-10 DIAGNOSIS — R002 Palpitations: Secondary | ICD-10-CM

## 2022-07-10 DIAGNOSIS — R209 Unspecified disturbances of skin sensation: Secondary | ICD-10-CM | POA: Diagnosis present

## 2022-07-10 DIAGNOSIS — Z720 Tobacco use: Secondary | ICD-10-CM | POA: Diagnosis present

## 2022-07-10 DIAGNOSIS — Z8249 Family history of ischemic heart disease and other diseases of the circulatory system: Secondary | ICD-10-CM

## 2022-07-10 DIAGNOSIS — F1911 Other psychoactive substance abuse, in remission: Secondary | ICD-10-CM | POA: Diagnosis present

## 2022-07-10 DIAGNOSIS — I252 Old myocardial infarction: Secondary | ICD-10-CM

## 2022-07-10 DIAGNOSIS — Z794 Long term (current) use of insulin: Secondary | ICD-10-CM

## 2022-07-10 DIAGNOSIS — E669 Obesity, unspecified: Secondary | ICD-10-CM | POA: Diagnosis present

## 2022-07-10 DIAGNOSIS — F1910 Other psychoactive substance abuse, uncomplicated: Secondary | ICD-10-CM | POA: Diagnosis present

## 2022-07-10 DIAGNOSIS — Z6835 Body mass index (BMI) 35.0-35.9, adult: Secondary | ICD-10-CM

## 2022-07-10 DIAGNOSIS — I10 Essential (primary) hypertension: Secondary | ICD-10-CM | POA: Diagnosis present

## 2022-07-10 DIAGNOSIS — I2510 Atherosclerotic heart disease of native coronary artery without angina pectoris: Secondary | ICD-10-CM | POA: Diagnosis present

## 2022-07-10 DIAGNOSIS — H547 Unspecified visual loss: Secondary | ICD-10-CM | POA: Diagnosis present

## 2022-07-10 DIAGNOSIS — M479 Spondylosis, unspecified: Secondary | ICD-10-CM | POA: Diagnosis present

## 2022-07-10 DIAGNOSIS — R29701 NIHSS score 1: Secondary | ICD-10-CM | POA: Diagnosis present

## 2022-07-10 DIAGNOSIS — I119 Hypertensive heart disease without heart failure: Secondary | ICD-10-CM | POA: Diagnosis present

## 2022-07-10 DIAGNOSIS — Z7984 Long term (current) use of oral hypoglycemic drugs: Secondary | ICD-10-CM

## 2022-07-10 DIAGNOSIS — Z833 Family history of diabetes mellitus: Secondary | ICD-10-CM

## 2022-07-10 DIAGNOSIS — R718 Other abnormality of red blood cells: Secondary | ICD-10-CM

## 2022-07-10 DIAGNOSIS — E7800 Pure hypercholesterolemia, unspecified: Secondary | ICD-10-CM | POA: Diagnosis present

## 2022-07-10 DIAGNOSIS — M5136 Other intervertebral disc degeneration, lumbar region: Secondary | ICD-10-CM | POA: Diagnosis present

## 2022-07-10 DIAGNOSIS — I639 Cerebral infarction, unspecified: Secondary | ICD-10-CM | POA: Diagnosis present

## 2022-07-10 DIAGNOSIS — Z79899 Other long term (current) drug therapy: Secondary | ICD-10-CM

## 2022-07-10 LAB — COMPREHENSIVE METABOLIC PANEL
ALT: 20 U/L (ref 0–44)
AST: 19 U/L (ref 15–41)
Albumin: 3.7 g/dL (ref 3.5–5.0)
Alkaline Phosphatase: 90 U/L (ref 38–126)
Anion gap: 11 (ref 5–15)
BUN: 15 mg/dL (ref 6–20)
CO2: 20 mmol/L — ABNORMAL LOW (ref 22–32)
Calcium: 9.7 mg/dL (ref 8.9–10.3)
Chloride: 105 mmol/L (ref 98–111)
Creatinine, Ser: 1.23 mg/dL (ref 0.61–1.24)
GFR, Estimated: >60 mL/min (ref >60)
Glucose, Bld: 201 mg/dL — ABNORMAL HIGH (ref 70–99)
Potassium: 4.2 mmol/L (ref 3.5–5.1)
Sodium: 136 mmol/L (ref 135–145)
Total Bilirubin: 0.5 mg/dL (ref 0.3–1.2)
Total Protein: 7.1 g/dL (ref 6.5–8.1)

## 2022-07-10 LAB — TROPONIN I (HIGH SENSITIVITY): Troponin I (High Sensitivity): 8 ng/L (ref <18)

## 2022-07-10 LAB — CBC
HCT: 41.9 % (ref 39.0–52.0)
Hemoglobin: 13.8 g/dL (ref 13.0–17.0)
MCH: 25.7 pg — ABNORMAL LOW (ref 26.0–34.0)
MCHC: 32.9 g/dL (ref 30.0–36.0)
MCV: 78.2 fL — ABNORMAL LOW (ref 80.0–100.0)
Platelets: 274 10*3/uL (ref 150–400)
RBC: 5.36 MIL/uL (ref 4.22–5.81)
RDW: 14 % (ref 11.5–15.5)
WBC: 8 10*3/uL (ref 4.0–10.5)
nRBC: 0 % (ref 0.0–0.2)

## 2022-07-10 MED ORDER — ASPIRIN 81 MG PO CHEW
324.0000 mg | CHEWABLE_TABLET | Freq: Once | ORAL | Status: AC
  Administered 2022-07-10: 324 mg via ORAL
  Filled 2022-07-10: qty 4

## 2022-07-10 NOTE — ED Provider Triage Note (Addendum)
Emergency Medicine Provider Triage Evaluation Note  Charles Orr , a 61 y.o. male  was evaluated in triage.  Pt complains of uncontrolled hypertension. Reports BP 197/112, 177/105x 12 days. Has been compliant with meds. Losartan 25mg . Reports shortness of breath, headaches, and bad dizzinessx12 days. Reports LUE weaknessx12 days.   Review of Systems  Positive: SOB, headaches and dizziness Negative: Chest pain  Physical Exam  BP (!) 158/93 (BP Location: Left Arm)   Pulse 84   Temp 97.9 F (36.6 C) (Oral)   Resp 20   SpO2 99%  Gen:   Awake, no distress   Resp:  Normal effort  MSK:   Moves extremities without difficulty  Other:  LUE 4/5 weakness, difficulty with nose to finger  Medical Decision Making  Medically screening exam initiated at 6:35 PM.  Appropriate orders placed.  Jodie Echevaria was informed that the remainder of the evaluation will be completed by another provider, this initial triage assessment does not replace that evaluation, and the importance of remaining in the ED until their evaluation is complete.    Osvaldo Shipper, PA 07/10/22 1839    Osvaldo Shipper, [place] 07/10/22 1842

## 2022-07-10 NOTE — ED Triage Notes (Signed)
Pt here from home with c/o htn , hx of same also c/o some dizziness when standing , hasn't missed any doses of his losartan

## 2022-07-11 ENCOUNTER — Observation Stay (HOSPITAL_COMMUNITY): Payer: Medicaid Other

## 2022-07-11 ENCOUNTER — Inpatient Hospital Stay (HOSPITAL_COMMUNITY): Payer: Medicaid Other

## 2022-07-11 DIAGNOSIS — Z833 Family history of diabetes mellitus: Secondary | ICD-10-CM | POA: Diagnosis not present

## 2022-07-11 DIAGNOSIS — E1169 Type 2 diabetes mellitus with other specified complication: Secondary | ICD-10-CM

## 2022-07-11 DIAGNOSIS — F1911 Other psychoactive substance abuse, in remission: Secondary | ICD-10-CM | POA: Diagnosis present

## 2022-07-11 DIAGNOSIS — I119 Hypertensive heart disease without heart failure: Secondary | ICD-10-CM | POA: Diagnosis present

## 2022-07-11 DIAGNOSIS — I6389 Other cerebral infarction: Secondary | ICD-10-CM

## 2022-07-11 DIAGNOSIS — R29701 NIHSS score 1: Secondary | ICD-10-CM | POA: Diagnosis present

## 2022-07-11 DIAGNOSIS — K219 Gastro-esophageal reflux disease without esophagitis: Secondary | ICD-10-CM

## 2022-07-11 DIAGNOSIS — R718 Other abnormality of red blood cells: Secondary | ICD-10-CM | POA: Diagnosis not present

## 2022-07-11 DIAGNOSIS — F250 Schizoaffective disorder, bipolar type: Secondary | ICD-10-CM

## 2022-07-11 DIAGNOSIS — E669 Obesity, unspecified: Secondary | ICD-10-CM | POA: Diagnosis present

## 2022-07-11 DIAGNOSIS — R079 Chest pain, unspecified: Secondary | ICD-10-CM

## 2022-07-11 DIAGNOSIS — G459 Transient cerebral ischemic attack, unspecified: Secondary | ICD-10-CM

## 2022-07-11 DIAGNOSIS — H547 Unspecified visual loss: Secondary | ICD-10-CM | POA: Diagnosis present

## 2022-07-11 DIAGNOSIS — Z794 Long term (current) use of insulin: Secondary | ICD-10-CM | POA: Diagnosis not present

## 2022-07-11 DIAGNOSIS — F17210 Nicotine dependence, cigarettes, uncomplicated: Secondary | ICD-10-CM | POA: Diagnosis present

## 2022-07-11 DIAGNOSIS — I10 Essential (primary) hypertension: Secondary | ICD-10-CM

## 2022-07-11 DIAGNOSIS — Z8249 Family history of ischemic heart disease and other diseases of the circulatory system: Secondary | ICD-10-CM | POA: Diagnosis not present

## 2022-07-11 DIAGNOSIS — I252 Old myocardial infarction: Secondary | ICD-10-CM | POA: Diagnosis not present

## 2022-07-11 DIAGNOSIS — E7800 Pure hypercholesterolemia, unspecified: Secondary | ICD-10-CM | POA: Diagnosis present

## 2022-07-11 DIAGNOSIS — Z6835 Body mass index (BMI) 35.0-35.9, adult: Secondary | ICD-10-CM | POA: Diagnosis not present

## 2022-07-11 DIAGNOSIS — I639 Cerebral infarction, unspecified: Secondary | ICD-10-CM

## 2022-07-11 DIAGNOSIS — E1165 Type 2 diabetes mellitus with hyperglycemia: Secondary | ICD-10-CM | POA: Diagnosis present

## 2022-07-11 DIAGNOSIS — R209 Unspecified disturbances of skin sensation: Secondary | ICD-10-CM | POA: Diagnosis present

## 2022-07-11 DIAGNOSIS — R002 Palpitations: Secondary | ICD-10-CM

## 2022-07-11 DIAGNOSIS — Z7984 Long term (current) use of oral hypoglycemic drugs: Secondary | ICD-10-CM | POA: Diagnosis not present

## 2022-07-11 DIAGNOSIS — M5136 Other intervertebral disc degeneration, lumbar region: Secondary | ICD-10-CM | POA: Diagnosis present

## 2022-07-11 DIAGNOSIS — Z720 Tobacco use: Secondary | ICD-10-CM

## 2022-07-11 DIAGNOSIS — I6381 Other cerebral infarction due to occlusion or stenosis of small artery: Secondary | ICD-10-CM | POA: Diagnosis present

## 2022-07-11 DIAGNOSIS — F1910 Other psychoactive substance abuse, uncomplicated: Secondary | ICD-10-CM

## 2022-07-11 DIAGNOSIS — E785 Hyperlipidemia, unspecified: Secondary | ICD-10-CM

## 2022-07-11 DIAGNOSIS — Z79899 Other long term (current) drug therapy: Secondary | ICD-10-CM | POA: Diagnosis not present

## 2022-07-11 DIAGNOSIS — I2510 Atherosclerotic heart disease of native coronary artery without angina pectoris: Secondary | ICD-10-CM | POA: Diagnosis present

## 2022-07-11 DIAGNOSIS — M797 Fibromyalgia: Secondary | ICD-10-CM | POA: Diagnosis present

## 2022-07-11 DIAGNOSIS — M479 Spondylosis, unspecified: Secondary | ICD-10-CM | POA: Diagnosis present

## 2022-07-11 LAB — LIPID PANEL
Cholesterol: 100 mg/dL (ref 0–200)
HDL: 32 mg/dL — ABNORMAL LOW (ref >40)
LDL Cholesterol: 53 mg/dL (ref 0–99)
Total CHOL/HDL Ratio: 3.1 RATIO
Triglycerides: 76 mg/dL (ref <150)
VLDL: 15 mg/dL (ref 0–40)

## 2022-07-11 LAB — RAPID URINE DRUG SCREEN, HOSP PERFORMED
Amphetamines: NOT DETECTED
Barbiturates: NOT DETECTED
Benzodiazepines: NOT DETECTED
Cocaine: NOT DETECTED
Opiates: NOT DETECTED
Tetrahydrocannabinol: NOT DETECTED

## 2022-07-11 LAB — APTT: aPTT: 28 seconds (ref 24–36)

## 2022-07-11 LAB — HEMOGLOBIN A1C
Hgb A1c MFr Bld: 8.9 % — ABNORMAL HIGH (ref 4.8–5.6)
Mean Plasma Glucose: 208.73 mg/dL

## 2022-07-11 LAB — CBG MONITORING, ED
Glucose-Capillary: 117 mg/dL — ABNORMAL HIGH (ref 70–99)
Glucose-Capillary: 189 mg/dL — ABNORMAL HIGH (ref 70–99)
Glucose-Capillary: 136 mg/dL — ABNORMAL HIGH (ref 70–99)
Glucose-Capillary: 201 mg/dL — ABNORMAL HIGH (ref 70–99)
Glucose-Capillary: 159 mg/dL — ABNORMAL HIGH (ref 70–99)
Glucose-Capillary: 171 mg/dL — ABNORMAL HIGH (ref 70–99)

## 2022-07-11 LAB — ECHOCARDIOGRAM COMPLETE
AR max vel: 3.47 cm2
AV Area VTI: 3.56 cm2
AV Area mean vel: 3.48 cm2
AV Mean grad: 1 mmHg
AV Peak grad: 2.9 mmHg
Ao pk vel: 0.86 m/s
Area-P 1/2: 2.61 cm2
S' Lateral: 2.7 cm

## 2022-07-11 LAB — URINALYSIS, ROUTINE W REFLEX MICROSCOPIC
Bilirubin Urine: NEGATIVE
Glucose, UA: >=500 mg/dL — AB
Hgb urine dipstick: NEGATIVE
Ketones, ur: NEGATIVE mg/dL
Leukocytes,Ua: NEGATIVE
Nitrite: NEGATIVE
Protein, ur: 100 mg/dL — AB
Specific Gravity, Urine: 1.026 (ref 1.005–1.030)
pH: 5 (ref 5.0–8.0)

## 2022-07-11 LAB — PROTIME-INR
INR: 1.2 (ref 0.8–1.2)
Prothrombin Time: 14.8 seconds (ref 11.4–15.2)

## 2022-07-11 LAB — IRON AND TIBC
Iron: 70 ug/dL (ref 45–182)
Saturation Ratios: 19 % (ref 17.9–39.5)
TIBC: 370 ug/dL (ref 250–450)
UIBC: 300 ug/dL

## 2022-07-11 LAB — GLUCOSE, CAPILLARY: Glucose-Capillary: 149 mg/dL — ABNORMAL HIGH (ref 70–99)

## 2022-07-11 LAB — FOLATE: Folate: 14.7 ng/mL (ref >5.9)

## 2022-07-11 LAB — TROPONIN I (HIGH SENSITIVITY): Troponin I (High Sensitivity): 9 ng/L (ref <18)

## 2022-07-11 LAB — FERRITIN: Ferritin: 90 ng/mL (ref 24–336)

## 2022-07-11 LAB — VITAMIN B12: Vitamin B-12: 420 pg/mL (ref 180–914)

## 2022-07-11 MED ORDER — ASPIRIN 81 MG PO CHEW
81.0000 mg | CHEWABLE_TABLET | Freq: Every day | ORAL | Status: DC
  Administered 2022-07-11 – 2022-07-13 (×3): 81 mg via ORAL
  Filled 2022-07-11 (×3): qty 1

## 2022-07-11 MED ORDER — CLOPIDOGREL BISULFATE 75 MG PO TABS
75.0000 mg | ORAL_TABLET | Freq: Every day | ORAL | Status: DC
  Administered 2022-07-11 – 2022-07-13 (×3): 75 mg via ORAL
  Filled 2022-07-11 (×3): qty 1

## 2022-07-11 MED ORDER — INSULIN ASPART 100 UNIT/ML IJ SOLN
0.0000 [IU] | Freq: Every day | INTRAMUSCULAR | Status: DC
  Administered 2022-07-12: 4 [IU] via SUBCUTANEOUS

## 2022-07-11 MED ORDER — ATORVASTATIN CALCIUM 80 MG PO TABS
80.0000 mg | ORAL_TABLET | Freq: Every day | ORAL | Status: DC
  Administered 2022-07-11 – 2022-07-13 (×3): 80 mg via ORAL
  Filled 2022-07-11 (×2): qty 1
  Filled 2022-07-11: qty 2

## 2022-07-11 MED ORDER — SODIUM CHLORIDE 0.9 % IV SOLN: INTRAVENOUS | Status: DC

## 2022-07-11 MED ORDER — RISPERIDONE 0.5 MG PO TABS
2.0000 mg | ORAL_TABLET | Freq: Every day | ORAL | Status: DC
  Administered 2022-07-11 – 2022-07-12 (×2): 2 mg via ORAL
  Filled 2022-07-11: qty 1
  Filled 2022-07-11 (×2): qty 4

## 2022-07-11 MED ORDER — IOHEXOL 350 MG/ML SOLN
75.0000 mL | Freq: Once | INTRAVENOUS | Status: AC | PRN
  Administered 2022-07-11: 75 mL via INTRAVENOUS

## 2022-07-11 MED ORDER — LABETALOL HCL 5 MG/ML IV SOLN: 10.0000 mg | INTRAVENOUS | Status: DC | PRN

## 2022-07-11 MED ORDER — LOSARTAN POTASSIUM 25 MG PO TABS
25.0000 mg | ORAL_TABLET | Freq: Every day | ORAL | Status: DC
  Administered 2022-07-11 – 2022-07-13 (×3): 25 mg via ORAL
  Filled 2022-07-11 (×3): qty 1

## 2022-07-11 MED ORDER — HYDRALAZINE HCL 20 MG/ML IJ SOLN
10.0000 mg | INTRAMUSCULAR | Status: DC | PRN
  Administered 2022-07-11: 10 mg via INTRAVENOUS
  Filled 2022-07-11: qty 1

## 2022-07-11 MED ORDER — CLONIDINE HCL 0.1 MG PO TABS
0.1000 mg | ORAL_TABLET | Freq: Two times a day (BID) | ORAL | Status: DC
  Administered 2022-07-11 – 2022-07-13 (×5): 0.1 mg via ORAL
  Filled 2022-07-11 (×5): qty 1

## 2022-07-11 MED ORDER — RISPERIDONE 1 MG PO TABS: 1.0000 mg | ORAL_TABLET | ORAL | Status: DC

## 2022-07-11 MED ORDER — PERFLUTREN LIPID MICROSPHERE
1.0000 mL | INTRAVENOUS | Status: AC | PRN
  Administered 2022-07-11: 2 mL via INTRAVENOUS

## 2022-07-11 MED ORDER — INSULIN GLARGINE-YFGN 100 UNIT/ML ~~LOC~~ SOLN
20.0000 [IU] | Freq: Two times a day (BID) | SUBCUTANEOUS | Status: DC
  Filled 2022-07-11 (×2): qty 0.2

## 2022-07-11 MED ORDER — STROKE: EARLY STAGES OF RECOVERY BOOK
Freq: Once | Status: AC
  Filled 2022-07-11: qty 1

## 2022-07-11 MED ORDER — DORZOLAMIDE HCL-TIMOLOL MAL 2-0.5 % OP SOLN
1.0000 [drp] | Freq: Two times a day (BID) | OPHTHALMIC | Status: DC
  Administered 2022-07-11 – 2022-07-13 (×5): 1 [drp] via OPHTHALMIC
  Filled 2022-07-11: qty 10

## 2022-07-11 MED ORDER — NICOTINE 21 MG/24HR TD PT24
21.0000 mg | MEDICATED_PATCH | Freq: Every day | TRANSDERMAL | Status: DC
  Administered 2022-07-11 – 2022-07-13 (×3): 21 mg via TRANSDERMAL
  Filled 2022-07-11 (×3): qty 1

## 2022-07-11 MED ORDER — RISPERIDONE 0.5 MG PO TABS
1.0000 mg | ORAL_TABLET | Freq: Every day | ORAL | Status: DC
  Administered 2022-07-12 – 2022-07-13 (×2): 1 mg via ORAL
  Filled 2022-07-11 (×2): qty 2

## 2022-07-11 MED ORDER — HYDROXYZINE PAMOATE 50 MG PO CAPS
100.0000 mg | ORAL_CAPSULE | Freq: Two times a day (BID) | ORAL | Status: DC | PRN
  Filled 2022-07-11: qty 2

## 2022-07-11 MED ORDER — ACETAMINOPHEN 325 MG PO TABS
650.0000 mg | ORAL_TABLET | Freq: Once | ORAL | Status: AC
  Administered 2022-07-11: 650 mg via ORAL
  Filled 2022-07-11: qty 2

## 2022-07-11 MED ORDER — ENOXAPARIN SODIUM 40 MG/0.4ML IJ SOSY
40.0000 mg | PREFILLED_SYRINGE | Freq: Every day | INTRAMUSCULAR | Status: DC
  Administered 2022-07-11 – 2022-07-13 (×3): 40 mg via SUBCUTANEOUS
  Filled 2022-07-11 (×3): qty 0.4

## 2022-07-11 MED ORDER — MECLIZINE HCL 25 MG PO TABS
25.0000 mg | ORAL_TABLET | Freq: Once | ORAL | Status: AC
  Administered 2022-07-11: 25 mg via ORAL
  Filled 2022-07-11: qty 1

## 2022-07-11 MED ORDER — EMPAGLIFLOZIN 25 MG PO TABS
25.0000 mg | ORAL_TABLET | Freq: Every morning | ORAL | Status: DC
  Administered 2022-07-11 – 2022-07-13 (×3): 25 mg via ORAL
  Filled 2022-07-11 (×3): qty 1

## 2022-07-11 MED ORDER — PANTOPRAZOLE SODIUM 40 MG PO TBEC
40.0000 mg | DELAYED_RELEASE_TABLET | Freq: Every day | ORAL | Status: DC
  Administered 2022-07-11 – 2022-07-13 (×3): 40 mg via ORAL
  Filled 2022-07-11 (×3): qty 1

## 2022-07-11 MED ORDER — TRAZODONE HCL 100 MG PO TABS
100.0000 mg | ORAL_TABLET | Freq: Every day | ORAL | Status: DC
  Administered 2022-07-11 – 2022-07-12 (×2): 100 mg via ORAL
  Filled 2022-07-11 (×2): qty 1

## 2022-07-11 MED ORDER — ACETAMINOPHEN 650 MG RE SUPP: 650.0000 mg | Freq: Four times a day (QID) | RECTAL | Status: DC | PRN

## 2022-07-11 MED ORDER — GABAPENTIN 600 MG PO TABS
600.0000 mg | ORAL_TABLET | Freq: Three times a day (TID) | ORAL | Status: DC
  Administered 2022-07-11 – 2022-07-13 (×7): 600 mg via ORAL
  Filled 2022-07-11 (×7): qty 1

## 2022-07-11 MED ORDER — INSULIN ASPART 100 UNIT/ML IJ SOLN
0.0000 [IU] | Freq: Three times a day (TID) | INTRAMUSCULAR | Status: DC
  Administered 2022-07-11: 5 [IU] via SUBCUTANEOUS
  Administered 2022-07-11: 3 [IU] via SUBCUTANEOUS
  Administered 2022-07-12: 8 [IU] via SUBCUTANEOUS
  Administered 2022-07-12: 2 [IU] via SUBCUTANEOUS
  Administered 2022-07-12: 8 [IU] via SUBCUTANEOUS
  Administered 2022-07-13: 5 [IU] via SUBCUTANEOUS

## 2022-07-11 MED ORDER — ACETAMINOPHEN 325 MG PO TABS: 650.0000 mg | ORAL_TABLET | Freq: Four times a day (QID) | ORAL | Status: DC | PRN

## 2022-07-11 NOTE — ED Notes (Signed)
Patient has multiple complaints. Patient taken to triage to talk to triage RN

## 2022-07-11 NOTE — ED Provider Notes (Signed)
Patient was re-evaluated in triage at this time. He is complaining of ongoing dizziness. Patient is also disconcerted that he has been waiting. Will order him tylenol and meclizine and send back out to the lobby until a bed is available. I did update him on results of his MRI and will need admission for ongoing management by neurology. Discussed if he decides to leave it will be AMA. He verbalizes understanding.    Mickie Hillier, PA-C 07/11/22 0248    Orpah Greek, MD 07/11/22 [phone]

## 2022-07-11 NOTE — H&P (Addendum)
History and Physical    Patient: Charles Orr XTK:[id number] DOB: 16-Dec-1960 DOA: 07/10/2022 DOS: the patient was seen and examined on 07/11/2022 PCP: [facility]  Patient coming from: Home  Chief Complaint:  Chief Complaint  Patient presents with   Hypertension   HPI: Charles Orr is a 61 y.o. male with medical history significant of hypertension, hyperlipidemia, MI, diabetes mellitus type 2, GERD, and polysubstance abuse(opiates, cocaine, tobacco) presents with complaints of dizziness.  He reports feeling like everything is moving around him even at rest for the last 11 days. Patient reports that symptoms feel different from vertigo.  Associated symptoms include left leg weakness, left-sided headache with pain running down his neck, worsening vision, decreased appetite, heart palpitations, and substernal chest pains.  Denies having any recent fever, nausea, vomiting, or diarrhea symptoms.  He had continued to smoke cigarettes, but reports wanting to quit at this time.  Patient admitted to prior cocaine use, but states last using 5-6 months ago.  He states that he does not drink alcohol on a regular basis.  Upon admission into the emergency department patient was noted to be afebrile, blood pressure elevated 165/105 other vital signs maintained.  Labs noted low MCV/MCH without anemia and glucose 201.  Urinalysis noted glucose, protein, and rare bacteria without significant findings to suggest infection.  UDS was negative.  Patient was not a candidate for thrombolytics as he was outside at the window.  MRI of the brain showed small acute or subacute infarct in the body of the corpus callosum and adjacent left frontal white matter.  Neurology has been formally consulted.  Patient had been given meclizine 25 mg p.o. and aspirin 324 mg of aspirin p.o.  Review of Systems: As mentioned in the history of present illness. All other systems reviewed and are negative. 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   Community acquired pneumonia of right lower lobe of lung    Degenerative disc disease, lumbar    Depression    DM (diabetes mellitus) (HCC)    Fibromyalgia    GERD (gastroesophageal reflux disease)    High cholesterol    Hyperglycemia    Hypertension    Localized edema    Myocardial infarction (HCC) 2002   Osteoarthritis    Other schizophrenia (HCC)    Pneumonia 1989   Polysubstance (including opioids) dependence with physiological dependence (HCC) 10/06/2017   Schizoaffective disorder, bipolar type (HCC) 06/16/2017   Schizophrenia (HCC)    Shortness of breath 06/10/2017   Tobacco abuse 07/09/2017   Type II diabetes mellitus (HCC)    Past Surgical History:  Procedure Laterality Date   CARDIAC CATHETERIZATION  12/2006   [name] 01/18/2011   INGUINAL HERNIA REPAIR Right    LEFT HEART CATH AND CORONARY ANGIOGRAPHY N/A 07/11/2017   Procedure: LEFT HEART CATH AND CORONARY ANGIOGRAPHY;  Surgeon: Tonny Bollman, MD;  Location: [facility] INVASIVE CV LAB;  Service: Cardiovascular;  Laterality: N/A;   PARATHYROIDECTOMY     PERICARDIAL TAP  2000   ?; in [place]/notes 01/18/2011   Social History:  reports that he has been smoking cigarettes. He has a 19.00 pack-year  smoking history. His smokeless tobacco use includes chew. He reports current alcohol use. He reports current drug use. Drug: Cocaine.  No Known Allergies  Family History  Problem Relation Age of Onset   Hypertension Mother    Diabetes Mother    Heart disease Father    Hypertension Sister    Diabetes Sister    Hypertension Brother    Diabetes Brother     Hypertension Sister    Diabetes Sister    Hypertension Brother    Diabetes Brother     Prior to Admission medications   Medication Sig Start Date End Date Taking? Authorizing Provider  albuterol (PROVENTIL HFA;VENTOLIN HFA) 108 (90 Base) MCG/ACT inhaler Inhale 2 puffs into the lungs every 6 (six) hours as needed for wheezing or shortness of breath.    [provider]  atorvastatin (LIPITOR) 80 MG tablet Take 80 mg by mouth at bedtime. 06/07/18   [provider]  busPIRone (BUSPAR) 10 MG tablet Take 1 tablet (10 mg total) by mouth 2 (two) times daily. 02/21/21   Antonieta Pert, MD  cloNIDine (CATAPRES) 0.1 MG tablet Take 0.1 mg by mouth 2 (two) times daily. 02/04/20   [provider]  dorzolamide-timolol (COSOPT) 22.3-6.8 MG/ML ophthalmic solution Place 1 drop into the right eye 2 (two) times daily.    [provider]  ergocalciferol (VITAMIN D2) 1.25 MG (50000 UT) capsule Take 50,000 Units by mouth once a week.    [provider]  gabapentin (NEURONTIN) 300 MG capsule Take 1 capsule (300 mg total) by mouth 3 (three) times daily. 02/21/21   Antonieta Pert, MD  insulin glargine (LANTUS) 100 UNIT/ML injection Inject 0.22 mLs (22 Units total) into the skin in the morning. 02/22/21   Jola Babinski, Marlane Mingle, MD  insulin glargine (LANTUS) 100 UNIT/ML injection Inject 0.38 mLs (38 Units total) into the skin at bedtime. 02/21/21   Antonieta Pert, MD  JANUVIA 50 MG tablet Take 50 mg by mouth daily. 02/01/21   [provider]  losartan (COZAAR) 25 MG tablet Take 1 tablet (25 mg total) by mouth daily. 12/31/18 02/13/21  Quintella Reichert, MD  metFORMIN (GLUCOPHAGE) 1000 MG tablet Take 1,000 mg by mouth 2 (two) times daily. 01/18/21   [provider]  risperiDONE (RISPERDAL) 1 MG tablet Take 1 tablet (1 mg) by mouth in the mornings & 2 tablets (2 mg) at bedtime: For mood control 02/21/21   Antonieta Pert, MD  sertraline (ZOLOFT) 100 MG tablet Take  1 tablet (100 mg total) by mouth daily. 02/22/21   Antonieta Pert, MD  traZODone (DESYREL) 100 MG tablet Take 100 mg by mouth at bedtime. 02/01/21   [provider]    Physical Exam: Vitals:   07/10/22 1827 07/10/22 1829 07/10/22 2149 07/11/22 0423  BP: (!) 158/93  (!) 165/105 (!) 159/100  Pulse: 84  77 81  Resp:  20 18 16   Temp: 97.9 F (36.6 C)  98.2 F (36.8 C) 99 F (37.2 C)  TempSrc: Oral  Oral   SpO2: 99%  99% 98%    Constitutional: NAD, calm, comfortable Eyes: PERRL, lids and conjunctivae normal ENMT: Mucous membranes are moist. Posterior pharynx clear of any exudate or lesions.Normal dentition.  Neck: normal, supple, no masses, no thyromegaly Respiratory: clear to auscultation bilaterally, no wheezing, no crackles. Normal respiratory effort. No accessory muscle use.  Cardiovascular: Regular rate and rhythm, no murmurs / rubs / gallops. No extremity edema. 2+ pedal pulses. No carotid  bruits.  Abdomen: no tenderness, no masses palpated. No hepatosplenomegaly. Bowel sounds positive.  Musculoskeletal: no clubbing / cyanosis. No joint deformity upper and lower extremities. Good ROM, no contractures. Normal muscle tone.  Skin: no rashes, lesions, ulcers. No induration Neurologic: CN 2-12 grossly intact. Sensation intact, DTR normal. Strength 5/5 in all 4.  Psychiatric: Normal judgment and insight. Alert and oriented x 3. Normal mood.   Data Reviewed:  EKG reveals sinus rhythm at 71 bpm with ST wave changes appreciated.  Reviewed labs, imaging, and pertinent records as noted above in HPI.   Assessment and Plan: CVA Subacute.  Patient presents with complaints of several days of dizziness, headache, and left leg weakness. MRI revealed acute/subacute small infarcts in the body of the corpus callosum and adjacent left frontal white matter. -Admit to telemetry bed -Stroke order set initiated -Neuro checks -Check Hemoglobin A1c and lipid panel  -Check CTA of the head  and neck -Check echocardiogram -PT/OT  to evaluate and treat -ASA and Plavix -Statin -Appreciate neurology consultative services, will follow-up   Essential hypertension Blood pressures initially elevated up to 165/105.  Patient appears to be out of the window for permissive hypertension.  Home blood pressure regimen includes clonidine 0.1 mg twice daily and losartan 25 mg daily. -Continue home blood pressure regimen -Hydralazine IV as needed  Uncontrolled diabetes mellitus type 2, with long-term use of insulin On admission glucose 201.  Last available hemoglobin A1c was 8.6 on 02/15/2021.  Home regimen includes glargine 30 units twice daily, NovoLog 16 units 3 times daily, Janumet 50-1000 mg twice daily, -Hypoglycemic protocols -Follow-up hemoglobin A1c -Hold Janumet  -continue [place] substitution of Semglee reduced to 20 units twice daily, but patient reported not taking any long-acting insulin if blood sugars were not greater than 200. -CBGs before every meal with moderate SSI -Adjust insulin regimen as needed  Palpitations chest pain Patient reports feeling palpitations with substernal chest pains.  High-sensitivity troponins negative x2. -Check TSH  -Follow-up echocardiogram -Follow-up telemetry.  Patient may warrant being set up with Holter monitor at discharge  Microcytosis without anemia Hemoglobin was noted to be 13.8 with MCV 78.2 and MCH 25.7 -Check anemia panel  Schizoaffective disorder, bipolar type Patient not on any medications except for hydroxyzine currently. -Will need to readdress with occasion patient is supposed to be on and resume  Hyperlipidemia -Follow-up lipid panel -Atorvastatin 80 mg daily  Tobacco abuse Patient reports that he is wanting to quit tobacco at this time. -Nicotine patch offered -Continue to counsel on need of cessation of tobacco use  History of polysubstance abuse UDS negative.  Patient reports last use of cocaine  was likely 5 to 6 months ago.  Last positive screen for cocaine was in 2022. -Continue to encourage cessation of illicit drug use.  GERD -Continue pharmacy substitution of Protonix  Obesity -Check height and weight  DVT prophylaxis: Lovenox Advance Care Planning:   Code Status: Full Code    Consults: Neurology  Family Communication: Sister updated over the phone  Severity of Illness: The appropriate patient status for this patient is INPATIENT. Inpatient status is judged to be reasonable and necessary in order to provide the required intensity of service to ensure the patient's safety. The patient's presenting symptoms, physical exam findings, and initial radiographic and laboratory data in the context of their chronic comorbidities is felt to place them at high risk for further clinical deterioration. Furthermore, it is not anticipated that the patient will be medically stable for discharge  from the hospital within 2 midnights of admission.   * I certify that at the point of admission it is my clinical judgment that the patient will require inpatient hospital care spanning beyond 2 midnights from the point of admission due to high intensity of service, high risk for further deterioration and high frequency of surveillance required.*  Author: Clydie Braun, MD 07/11/2022 7:13 AM  For on call review [email or web address].

## 2022-07-11 NOTE — ED Notes (Signed)
Pt bed linens changed, gown changed, bedside table placed with pt's phone charging, pillows adjusted and questions/concerns addressed.  Pt wishes to speak with pharmacy tech about his medications

## 2022-07-11 NOTE — Consult Note (Signed)
Neurology Consultation  Reason for Consult: Hypertension and dizziness, stroke seen on MRI Referring Physician: Dr. Katrinka Blazing  CC: Dizziness and hypertension  History is obtained from: Patient and chart  HPI: Charles Orr is a 61 y.o. male with history of diabetes, schizophrenia, hypertension, hyperlipidemia, visual impairment, cocaine abuse, GERD and CAD who presents with 4 days of uncontrolled blood pressure and dizziness.  Patient states that his systolic blood pressures have been in the 190s at home despite taking his antihypertensives as prescribed.  He has been worked up in the past several times for dizziness with most recent etiology of dizziness in September being dehydration and AKI.  He endorses increased dizziness with rapid head motion.  Patient has a history of cocaine abuse, but urine toxicology for this admission was negative.  He reports that he quit smoking 11 days ago.  He states he has had no recent illnesses but does endorse constipation and itching upon urination.  Patient does not demonstrate ataxia or unilateral weakness on exam. He has baseline visual impairment and is unable to count fingers on exam, but is able to detect motion of hands.   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     LKW: 11/3 TNK given?: no, outside of window IR Thrombectomy? No, outside of window, no LVO Modified Rankin Scale: 2-Slight disability-UNABLE to perform all activities but does not need assistance  ROS: A complete ROS was performed and is negative except as noted in the HPI.   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) 10/06/2017   Community acquired pneumonia of right lower lobe of lung    Degenerative disc disease, lumbar    Depression    DM (diabetes mellitus) (HCC)    Fibromyalgia    GERD (gastroesophageal reflux disease)    High cholesterol    Hyperglycemia    Hypertension    Localized edema    Myocardial infarction (HCC) 2002   Osteoarthritis    Other schizophrenia (HCC)    Pneumonia 1989   Polysubstance (including opioids) dependence with physiological dependence (HCC) 10/06/2017   Schizoaffective disorder, bipolar type (HCC) 06/16/2017   Schizophrenia (HCC)    Shortness of breath 06/10/2017   Tobacco abuse 07/09/2017   Type II diabetes mellitus (HCC)      Family History  Problem Relation Age of Onset   Hypertension Mother    Diabetes Mother    Heart disease Father    Hypertension Sister    Diabetes Sister    Hypertension Brother    Diabetes Brother    Hypertension Sister    Diabetes Sister    Hypertension Brother    Diabetes Brother      Social History:   reports that he has been smoking cigarettes. He has a 19.00 pack-year smoking history. His smokeless tobacco use includes chew. He reports current alcohol use. He reports current drug use. Drug: Cocaine.  Medications  Current Facility-Administered Medications:    0.9 %  sodium chloride infusion, , Intravenous, Continuous, Smith, Rondell A, MD, Last Rate: 75 mL/hr at 07/11/22 0940, New Bag at 07/11/22 0940   acetaminophen (  TYLENOL) tablet 650 mg, 650 mg, Oral, Q6H PRN **OR** acetaminophen (TYLENOL) suppository 650 mg, 650 mg, Rectal, Q6H PRN, Howerter, Justin B, DO   aspirin chewable tablet 81 mg, 81 mg, Oral, Daily, Smith, Rondell A, MD, 81 mg at 07/11/22 0935   atorvastatin (LIPITOR) tablet 80 mg, 80 mg, Oral, Daily, Tamala Julian, Rondell A, MD, 80 mg at 07/11/22 0935   cloNIDine (CATAPRES) tablet 0.1 mg, 0.1 mg, Oral, BID, Smith, Rondell A, MD, 0.1 mg at 07/11/22 1023   dorzolamide-timolol (COSOPT) 2-0.5 %  ophthalmic solution 1 drop, 1 drop, Right Eye, BID, Smith, Rondell A, MD, 1 drop at 07/11/22 1022   empagliflozin (JARDIANCE) tablet 25 mg, 25 mg, Oral, q morning, Smith, Rondell A, MD, 25 mg at 07/11/22 1023   enoxaparin (LOVENOX) injection 40 mg, 40 mg, Subcutaneous, Daily, Tamala Julian, Rondell A, MD, 40 mg at 07/11/22 0935   gabapentin (NEURONTIN) tablet 600 mg, 600 mg, Oral, TID, Tamala Julian, Rondell A, MD, 600 mg at 07/11/22 1023   hydrOXYzine (VISTARIL) capsule 100 mg, 100 mg, Oral, BID PRN, Tamala Julian, Rondell A, MD   insulin aspart (novoLOG) injection 0-15 Units, 0-15 Units, Subcutaneous, TID WC, Smith, Rondell A, MD   insulin aspart (novoLOG) injection 0-5 Units, 0-5 Units, Subcutaneous, QHS, Smith, Rondell A, MD   insulin glargine-yfgn (SEMGLEE) injection 20 Units, 20 Units, Subcutaneous, BID, Smith, Rondell A, MD   labetalol (NORMODYNE) injection 10 mg, 10 mg, Intravenous, Q2H PRN, Howerter, Justin B, DO   losartan (COZAAR) tablet 25 mg, 25 mg, Oral, Daily, Smith, Rondell A, MD, 25 mg at 07/11/22 1023   nicotine (NICODERM CQ - dosed in mg/24 hours) patch 21 mg, 21 mg, Transdermal, Daily, Smith, Rondell A, MD, 21 mg at 07/11/22 0936   pantoprazole (PROTONIX) EC tablet 40 mg, 40 mg, Oral, Daily, Smith, Rondell A, MD, 40 mg at 07/11/22 1024  Current Outpatient Medications:    atorvastatin (LIPITOR) 80 MG tablet, Take 80 mg by mouth at bedtime., Disp: , Rfl: 3   cloNIDine (CATAPRES) 0.1 MG tablet, Take 0.1 mg by mouth 2 (two) times daily., Disp: , Rfl:    dorzolamide-timolol (COSOPT) 22.3-6.8 MG/ML ophthalmic solution, Place 1 drop into the right eye 2 (two) times daily., Disp: , Rfl:    ergocalciferol (VITAMIN D2) 1.25 MG (50000 UT) capsule, Take 50,000 Units by mouth once a week., Disp: , Rfl:    esomeprazole (NEXIUM) 40 MG capsule, Take 40 mg by mouth every morning., Disp: , Rfl:    gabapentin (NEURONTIN) 600 MG tablet, Take 600 mg by mouth 3 (three) times daily., Disp: , Rfl:    hydrOXYzine (VISTARIL)  50 MG capsule, Take 100-150 mg by mouth 2 (two) times daily as needed for anxiety., Disp: , Rfl:    Insulin Aspart FlexPen (NOVOLOG) 100 UNIT/ML, Inject 16 Units into the skin in the morning, at noon, and at bedtime., Disp: , Rfl:    insulin glargine (LANTUS) 100 UNIT/ML injection, Inject 0.22 mLs (22 Units total) into the skin in the morning. (Patient taking differently: Inject 30 Units into the skin 2 (two) times daily.), Disp: 10 mL, Rfl: 0   JANUMET 50-1000 MG tablet, Take 1 tablet by mouth 2 (two) times daily., Disp: , Rfl:    JARDIANCE 25 MG TABS tablet, Take 25 mg by mouth every morning., Disp: , Rfl:    losartan (COZAAR) 25 MG tablet, Take 1 tablet (25 mg total) by mouth daily., Disp: 90 tablet, Rfl: 3   traZODone (DESYREL) 100 MG tablet, Take 100  mg by mouth at bedtime., Disp: , Rfl:    albuterol (PROVENTIL HFA;VENTOLIN HFA) 108 (90 Base) MCG/ACT inhaler, Inhale 2 puffs into the lungs every 6 (six) hours as needed for wheezing or shortness of breath. (Patient not taking: Reported on 07/11/2022), Disp: , Rfl:    busPIRone (BUSPAR) 10 MG tablet, Take 1 tablet (10 mg total) by mouth 2 (two) times daily., Disp: 60 tablet, Rfl: 0   risperiDONE (RISPERDAL) 1 MG tablet, Take 1 tablet (1 mg) by mouth in the mornings & 2 tablets (2 mg) at bedtime: For mood control (Patient not taking: Reported on 07/11/2022), Disp: 90 tablet, Rfl: 0   sertraline (ZOLOFT) 100 MG tablet, Take 1 tablet (100 mg total) by mouth daily. (Patient not taking: Reported on 07/11/2022), Disp: 30 tablet, Rfl: 0   Exam: Current vital signs: BP 139/76   Pulse 76   Temp (!) 97.3 F (36.3 C) (Axillary)   Resp 14   SpO2 100%  Vital signs in last 24 hours: Temp:  [97.3 F (36.3 C)-99 F (37.2 C)] 97.3 F (36.3 C) (11/07 0928) Pulse Rate:  [74-84] 76 (11/07 1000) Resp:  [14-23] 14 (11/07 1000) BP: (139-165)/(76-105) 139/76 (11/07 1000) SpO2:  [98 %-100 %] 100 % (11/07 1000)  GENERAL: Awake, alert, in no acute  distress Psych: Affect appropriate for situation, patient is calm and cooperative with examination, speech somewhat tangential Head: Normocephalic and atraumatic, without obvious abnormality EENT: Normal conjunctivae, dry mucous membranes, no OP obstruction LUNGS: Normal respiratory effort. Non-labored breathing on room air CV: Regular rate and rhythm on telemetry Extremities: warm, well perfused, without obvious deformity  NEURO:  Mental Status: Awake, alert, and oriented to person, place, time, and situation. He is able to provide a clear but tangential history of present illness. Speech/Language: speech is clear and fluent.   Fluency, and comprehension intact without aphasia  No neglect is noted Cranial Nerves:  II: PERRL visual fields full, unable to count fingers but can detect hand motion bilaterally III, IV, VI: EOMI. Lid elevation symmetric and full.  V: Sensation is intact to light touch and symmetrical to face.   VII: Face is symmetric resting and smiling.  VIII: Hearing intact to voice IX, X: Palate elevation is symmetric.   XI: Normal sternocleidomastoid and trapezius muscle strength XII: Tongue protrudes midline without fasciculations.   Motor: 5/5 strength is all muscle groups.  Tone is normal. Bulk is normal.  Sensation: Intact to light touch bilaterally in all four extremities, but endorses mild sensory deficit on the left Coordination: FTN intact bilaterally. HKS intact bilaterally. No pronator drift.  DTRs: 2+ throughout.  Gait: Deferred  NIHSS: 1a Level of Conscious.: 0 1b LOC Questions: 0 1c LOC Commands: 0 2 Best Gaze: 0 3 Visual: 0 4 Facial Palsy: 0 5a Motor Arm - left: 0 5b Motor Arm - Right: 0 6a Motor Leg - Left: 0 6b Motor Leg - Right: 0 7 Limb Ataxia: 0 8 Sensory: 1 9 Best Language: 0 10 Dysarthria: 0 11 Extinct. and Inatten.: 0 TOTAL: 1   Labs I have reviewed labs in epic and the results pertinent to this consultation are:  CBC     Component Value Date/Time   WBC 8.0 07/10/2022 1858   RBC 5.36 07/10/2022 1858   HGB 13.8 07/10/2022 1858   HCT 41.9 07/10/2022 1858   PLT 274 07/10/2022 1858   MCV 78.2 (L) 07/10/2022 1858   MCH 25.7 (L) 07/10/2022 1858   MCHC 32.9 07/10/2022 1858  RDW 14.0 07/10/2022 1858   LYMPHSABS 1.2 02/06/2020 1012   MONOABS 0.4 02/06/2020 1012   EOSABS 0.0 02/06/2020 1012   BASOSABS 0.1 02/06/2020 1012    CMP     Component Value Date/Time   NA 136 07/10/2022 1858   K 4.2 07/10/2022 1858   CL 105 07/10/2022 1858   CO2 20 (L) 07/10/2022 1858   GLUCOSE 201 (H) 07/10/2022 1858   BUN 15 07/10/2022 1858   CREATININE 1.23 07/10/2022 1858   CALCIUM 9.7 07/10/2022 1858   PROT 7.1 07/10/2022 1858   ALBUMIN 3.7 07/10/2022 1858   AST 19 07/10/2022 1858   ALT 20 07/10/2022 1858   ALKPHOS 90 07/10/2022 1858   BILITOT 0.5 07/10/2022 1858   GFRNONAA >60 07/10/2022 1858   GFRAA >60 02/06/2020 1012    Lipid Panel     Component Value Date/Time   CHOL 125 02/15/2021 0607   TRIG 64 02/15/2021 0607   HDL 29 (L) 02/15/2021 0607   CHOLHDL 4.3 02/15/2021 0607   VLDL 13 02/15/2021 0607   LDLCALC 83 02/15/2021 0607     Imaging I have reviewed the images obtained:  CT-scan of the brain: No acute abnormality  CTA head: No large vessel occlusion or significant stenosis  MRI examination of the brain: Small acute or subacute infarct in body of corpus callosum and adjacent left frontal white matter  Assessment: 61 year old patient with history of diabetes, hypertension, hyperlipidemia, schizophrenia, visual impairment, GERD and substance abuse presents with 4 days of hypertension and dizziness.  MRI shows stroke in the left corpus callosum and adjacent white matter.  He has been worked up for dizziness multiple times in the past, and most recent episode of dizziness in September was felt to be due to dehydration in the setting of an AKI.  No ataxia, nystagmus or skew is noted on exam, and stroke  seen on MRI does not explain symptoms of dizziness and mild left-sided sensory deficit.  Patient endorses worsening of dizziness with rapid head movement, and CTA reveals no stenosis of blood vessels in neck.  Stroke is likely incidental.  Recommend admission for full stroke work-up for patient with multiple risk factors as well as physical therapy evaluation for vestibular assessment and rehab.  Impression: Acute ischemic stroke in patient with multiple risk factors, and recurrent dizziness of unknown origin  Recommendations: Stroke/TIA Workup  - Admit for stroke workup - Permissive HTN x48 hrs from sx onset or until stroke ruled out by MRI goal BP <220/110. PRN labetalol or hydralazine if BP above these parameters. Avoid oral antihypertensives. - TTE w/ bubble - Check A1c and LDL + add statin per guidelines -Aspirin 81 mg daily, and clopidogrel 75 mg daily antiplt/anticoag - q4 hr neuro checks - STAT head CT for any change in neuro exam - Tele - PT/OT/SLP, PT for vestibular evaluation and rehab - Stroke education - Amb referral to neurology upon discharge    Pt seen by NP/Neuro and later by MD. Note/plan to be edited by MD as needed.  Cortney E Ernestina Columbiae La Torre , MSN, AGACNP-BC [facility] See Amion for schedule and pager information 07/11/2022 11:14 AM    NEUROHOSPITALIST ADDENDUM Performed a face to face diagnostic evaluation.   I have reviewed the contents of history and physical exam as documented by PA/ARNP/Resident and agree with above documentation.  I have discussed and formulated the above plan as documented. Edits to the note have been made as needed.  Impression/Key exam findings/Plan: Stroke is  likely incidental and does not explain his dizziness. Will need stroke workup. Would benefit from vestibular rehab.  Erick Blinks, MD [facility] [phone]   If 7pm to 7am, please call on call as listed on AMION.

## 2022-07-11 NOTE — Progress Notes (Signed)
  Echocardiogram 2D Echocardiogram has been performed.  Charles Orr 07/11/2022, 4:41 PM

## 2022-07-11 NOTE — ED Notes (Signed)
Patient given [place] sandwich and water. Ok to give per triage PA

## 2022-07-11 NOTE — ED Provider Notes (Signed)
[facility] EMERGENCY DEPARTMENT Provider Note   CSN: [id number] Arrival date & time: 07/10/22  1818     History  Chief Complaint  Patient presents with   Hypertension    Charles Orr is a 61 y.o. male.  The history is provided by the patient.  Hypertension This is a chronic problem. The current episode started more than 1 week ago. The problem occurs constantly. The problem has been gradually worsening. Pertinent negatives include no chest pain, no abdominal pain, no headaches and no shortness of breath. Nothing aggravates the symptoms. Nothing relieves the symptoms. The treatment provided no relief.  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   Community acquired pneumonia of right lower lobe of lung    Degenerative disc disease, lumbar    Depression    DM (diabetes mellitus) (HCC)    Fibromyalgia    GERD (gastroesophageal reflux disease)    High cholesterol    Hyperglycemia    Hypertension    Localized edema    Myocardial infarction (HCC) 2002   Osteoarthritis    Other schizophrenia (HCC)    Pneumonia 1989   Polysubstance (including opioids) dependence with physiological dependence (HCC) 10/06/2017   Schizoaffective disorder, bipolar type (HCC) 06/16/2017   Schizophrenia (HCC)    Shortness of breath 06/10/2017   Tobacco abuse 07/09/2017   Type II diabetes mellitus (HCC)      Home Medications Prior to Admission medications   Medication Sig Start Date End Date Taking? Authorizing Provider  albuterol (PROVENTIL  HFA;VENTOLIN HFA) 108 (90 Base) MCG/ACT inhaler Inhale 2 puffs into the lungs every 6 (six) hours as needed for wheezing or shortness of breath.    [provider]  atorvastatin (LIPITOR) 80 MG tablet Take 80 mg by mouth at bedtime. 06/07/18   [provider]  busPIRone (BUSPAR) 10 MG tablet Take 1 tablet (10 mg total) by mouth 2 (two) times daily. 02/21/21   Antonieta Pert, MD  cloNIDine (CATAPRES) 0.1 MG tablet Take 0.1 mg by mouth 2 (two) times daily. 02/04/20   [provider]  dorzolamide-timolol (COSOPT) 22.3-6.8 MG/ML ophthalmic solution Place 1 drop into the right eye 2 (two) times daily.    [provider]  ergocalciferol (VITAMIN D2) 1.25 MG (50000 UT) capsule Take 50,000 Units by mouth once a week.    [provider]  gabapentin (NEURONTIN) 300 MG capsule Take 1 capsule (300 mg total) by mouth 3 (three) times daily. 02/21/21   Antonieta Pert, MD  insulin glargine (LANTUS) 100 UNIT/ML injection Inject 0.22 mLs (22 Units total) into the skin in the morning. 02/22/21   Jola Babinski, Marlane Mingle, MD  insulin glargine (LANTUS) 100 UNIT/ML injection Inject 0.38 mLs (38 Units total) into the skin at bedtime. 02/21/21   Antonieta Pert, MD  JANUVIA 50 MG tablet Take 50 mg by mouth daily. 02/01/21   [provider]  losartan (COZAAR) 25 MG tablet Take 1 tablet (25 mg total) by mouth daily. 12/31/18 02/13/21  Quintella Reichert, MD  metFORMIN (GLUCOPHAGE) 1000 MG tablet Take 1,000 mg by mouth 2 (two) times daily. 01/18/21   [provider]  risperiDONE (RISPERDAL) 1 MG tablet Take 1 tablet (1 mg) by mouth in the mornings & 2 tablets (2 mg) at bedtime: For mood control 02/21/21   Antonieta Pert, MD  sertraline (ZOLOFT) 100 MG tablet Take 1 tablet (100 mg total) by mouth daily. 02/22/21   Antonieta Pert, MD  traZODone (DESYREL) 100 MG tablet Take 100 mg by mouth at bedtime. 02/01/21   [provider]      Allergies    Patient has no  known allergies.    Review of Systems   Review of Systems  Constitutional:  Negative for fever.  HENT:  Negative for facial swelling.   Eyes:  Negative for redness.  Respiratory:  Negative for shortness of breath.   Cardiovascular:  Negative for chest pain.  Gastrointestinal:  Negative for abdominal pain.  Neurological:  Positive for dizziness. Negative for headaches.    Physical Exam Updated Vital Signs BP (!) 159/100 (BP Location: Left Arm)   Pulse 81   Temp 99 F (37.2 C)   Resp 16   SpO2 98%  Physical Exam Vitals and nursing note reviewed.  Constitutional:      General: He is not in acute distress.    Appearance: He is well-developed. He is not diaphoretic.  HENT:     Head: Normocephalic and atraumatic.     Nose: Nose normal.     Mouth/Throat:     Mouth: Mucous membranes are moist.     Pharynx: Oropharynx is clear.  Eyes:     Conjunctiva/sclera: Conjunctivae normal.     Pupils: Pupils are equal, round, and reactive to light.  Cardiovascular:     Rate and Rhythm: Normal rate and regular rhythm.     Pulses: Normal pulses.     Heart sounds: Normal heart sounds.  Pulmonary:     Effort: Pulmonary effort is normal.     Breath sounds: Normal breath sounds. No wheezing or rales.  Abdominal:     General: Bowel sounds are normal.     Palpations: Abdomen is soft.     Tenderness: There is no abdominal tenderness. There is no guarding or rebound.  Musculoskeletal:        General: Normal range of motion.     Cervical back: Normal range of motion and neck supple.  Skin:    General: Skin is warm and dry.     Capillary Refill: Capillary refill takes less than 2 seconds.  Neurological:     General: No focal deficit present.     Mental Status: He is alert and oriented to person, place, and time.     Deep Tendon Reflexes: Reflexes normal.  Psychiatric:        Mood and Affect: Mood normal.        Behavior: Behavior normal.     ED Results / Procedures / Treatments    Labs (all labs ordered are listed, but only abnormal results are displayed) Results for orders placed or performed during the hospital encounter of 07/10/22  CBC  Result Value Ref Range   WBC 8.0 4.0 - 10.5 K/uL   RBC 5.36 4.22 - 5.81 MIL/uL   Hemoglobin 13.8 13.0 - 17.0 g/dL   HCT 30.1 60.1 - 09.3 %   MCV 78.2 (L) 80.0 - 100.0 fL   MCH 25.7 (L) 26.0 - 34.0 pg   MCHC 32.9 30.0 -  36.0 g/dL   RDW 17.5 10.2 - 58.5 %   Platelets 274 150 - 400 K/uL   nRBC 0.0 0.0 - 0.2 %  Comprehensive metabolic panel  Result Value Ref Range   Sodium 136 135 - 145 mmol/L   Potassium 4.2 3.5 - 5.1 mmol/L   Chloride 105 98 - 111 mmol/L   CO2 20 (L) 22 - 32 mmol/L   Glucose, Bld 201 (H) 70 - 99 mg/dL   BUN 15 6 - 20 mg/dL   Creatinine, Ser 2.77 0.61 - 1.24 mg/dL   Calcium 9.7 8.9 - 82.4 mg/dL   Total Protein 7.1 6.5 - 8.1 g/dL   Albumin 3.7 3.5 - 5.0 g/dL   AST 19 15 - 41 U/L   ALT 20 0 - 44 U/L   Alkaline Phosphatase 90 38 - 126 U/L   Total Bilirubin 0.5 0.3 - 1.2 mg/dL   GFR, Estimated >23 >53 mL/min   Anion gap 11 5 - 15  Protime-INR  Result Value Ref Range   Prothrombin Time 14.8 11.4 - 15.2 seconds   INR 1.2 0.8 - 1.2  APTT  Result Value Ref Range   aPTT 28 24 - 36 seconds  Urinalysis, Routine w reflex microscopic Urine, Clean Catch  Result Value Ref Range   Color, Urine YELLOW YELLOW   APPearance CLEAR CLEAR   Specific Gravity, Urine 1.026 1.005 - 1.030   pH 5.0 5.0 - 8.0   Glucose, UA >=500 (A) NEGATIVE mg/dL   Hgb urine dipstick NEGATIVE NEGATIVE   Bilirubin Urine NEGATIVE NEGATIVE   Ketones, ur NEGATIVE NEGATIVE mg/dL   Protein, ur 614 (A) NEGATIVE mg/dL   Nitrite NEGATIVE NEGATIVE   Leukocytes,Ua NEGATIVE NEGATIVE   RBC / HPF 0-5 0 - 5 RBC/hpf   WBC, UA 0-5 0 - 5 WBC/hpf   Bacteria, UA RARE (A) NONE SEEN   Squamous Epithelial / LPF 0-5 0 - 5   Mucus PRESENT    Hyaline Casts, UA PRESENT   Rapid urine drug screen (hospital performed)  Result Value Ref Range   Opiates  NONE DETECTED NONE DETECTED   Cocaine NONE DETECTED NONE DETECTED   Benzodiazepines NONE DETECTED NONE DETECTED   Amphetamines NONE DETECTED NONE DETECTED   Tetrahydrocannabinol NONE DETECTED NONE DETECTED   Barbiturates NONE DETECTED NONE DETECTED  Troponin I (High Sensitivity)  Result Value Ref Range   Troponin I (High Sensitivity) 8 <18 ng/L  Troponin I (High Sensitivity)  Result Value Ref Range   Troponin I (High Sensitivity) 9 <18 ng/L   CT Head Wo Contrast  Result Date: 07/10/2022 CLINICAL DATA:  Dizziness EXAM: CT HEAD WITHOUT CONTRAST TECHNIQUE: Contiguous axial images were obtained from the base of the skull through the vertex without intravenous contrast. RADIATION DOSE REDUCTION: This exam was performed according to the departmental dose-optimization program which includes automated exposure control, adjustment of the mA and/or kV according to patient size and/or use of iterative reconstruction technique. COMPARISON:  MRI 07/10/2022, head CT 05/21/2022 FINDINGS: Brain: No acute territorial infarction, hemorrhage or intracranial mass. The MRI demonstrated small acute to subacute infarcts involving the corpus callosum and left frontal white matter is not well demonstrated by CT. Stable ventricle size. Vascular: No hyperdense vessels.  No unexpected calcification Skull: Normal. Negative for fracture or focal lesion. Sinuses/Orbits: Hyperdense left globe is chronic. Other: None IMPRESSION: No definite CT evidence for acute intracranial abnormality. The MRI demonstrated small acute to subacute infarct is not well seen on CT. Electronically Signed  By: Donavan Foil M.D.   On: 07/10/2022 23:04   MR BRAIN WO CONTRAST  Result Date: 07/10/2022 CLINICAL DATA:  Neuro deficit, acute, stroke suspected dizziness, LUE weakness EXAM: MRI HEAD WITHOUT CONTRAST TECHNIQUE: Multiplanar, multiecho pulse sequences of the brain and surrounding structures were obtained without intravenous contrast.  COMPARISON:  MRI 08/27/2018. FINDINGS: Brain: Small acute or subacute infarct in the body of the corpus callosum and adjacent left frontal white matter. Mild associated edema without significant mass effect. Mild additional scattered T2/FLAIR hyperintensities in the white matter, nonspecific but compatible with chronic microvascular ischemic disease. No evidence of acute hemorrhage, mass lesion, midline shift, or hydrocephalus. Vascular: Major arterial flow voids are maintained at the skull base. Skull and upper cervical spine: Normal marrow signal. Sinuses/Orbits: Negative. Other: No mastoid effusions. IMPRESSION: Small acute or subacute infarct in the body of the corpus callosum and adjacent left frontal white matter. Electronically Signed   By: Margaretha Sheffield M.D.   On: 07/10/2022 20:48   DG Chest 1 View  Result Date: 07/10/2022 CLINICAL DATA:  Hypertension, dizziness, weakness EXAM: CHEST  1 VIEW COMPARISON:  05/21/2022 FINDINGS: Lungs are clear.  No pleural effusion or pneumothorax. The heart is normal in size. IMPRESSION: No active disease. Electronically Signed   By: Julian Hy M.D.   On: 07/10/2022 19:25    EKG  EKG Interpretation  Date/Time:  Tuesday July 11 2022 06:12:21 EST Ventricular Rate:  71 PR Interval:  140 QRS Duration: 80 QT Interval:  393 QTC Calculation: 428 R Axis:   28 Text Interpretation: Sinus rhythm  St T changes Confirmed by Dory Horn) on 07/11/2022 6:16:30 AM         Radiology CT Head Wo Contrast  Result Date: 07/10/2022 CLINICAL DATA:  Dizziness EXAM: CT HEAD WITHOUT CONTRAST TECHNIQUE: Contiguous axial images were obtained from the base of the skull through the vertex without intravenous contrast. RADIATION DOSE REDUCTION: This exam was performed according to the departmental dose-optimization program which includes automated exposure control, adjustment of the mA and/or kV according to patient size and/or use of iterative  reconstruction technique. COMPARISON:  MRI 07/10/2022, head CT 05/21/2022 FINDINGS: Brain: No acute territorial infarction, hemorrhage or intracranial mass. The MRI demonstrated small acute to subacute infarcts involving the corpus callosum and left frontal white matter is not well demonstrated by CT. Stable ventricle size. Vascular: No hyperdense vessels.  No unexpected calcification Skull: Normal. Negative for fracture or focal lesion. Sinuses/Orbits: Hyperdense left globe is chronic. Other: None IMPRESSION: No definite CT evidence for acute intracranial abnormality. The MRI demonstrated small acute to subacute infarct is not well seen on CT. Electronically Signed   By: Donavan Foil M.D.   On: 07/10/2022 23:04   MR BRAIN WO CONTRAST  Result Date: 07/10/2022 CLINICAL DATA:  Neuro deficit, acute, stroke suspected dizziness, LUE weakness EXAM: MRI HEAD WITHOUT CONTRAST TECHNIQUE: Multiplanar, multiecho pulse sequences of the brain and surrounding structures were obtained without intravenous contrast. COMPARISON:  MRI 08/27/2018. FINDINGS: Brain: Small acute or subacute infarct in the body of the corpus callosum and adjacent left frontal white matter. Mild associated edema without significant mass effect. Mild additional scattered T2/FLAIR hyperintensities in the white matter, nonspecific but compatible with chronic microvascular ischemic disease. No evidence of acute hemorrhage, mass lesion, midline shift, or hydrocephalus. Vascular: Major arterial flow voids are maintained at the skull base. Skull and upper cervical spine: Normal marrow signal. Sinuses/Orbits: Negative. Other: No mastoid effusions. IMPRESSION: Small acute or subacute infarct in  the body of the corpus callosum and adjacent left frontal white matter. Electronically Signed   By: Feliberto HartsFrederick S Jones M.D.   On: 07/10/2022 20:48   DG Chest 1 View  Result Date: 07/10/2022 CLINICAL DATA:  Hypertension, dizziness, weakness EXAM: CHEST  1 VIEW  COMPARISON:  05/21/2022 FINDINGS: Lungs are clear.  No pleural effusion or pneumothorax. The heart is normal in size. IMPRESSION: No active disease. Electronically Signed   By: Charline BillsSriyesh  Krishnan M.D.   On: 07/10/2022 19:25    Procedures Procedures    Medications Ordered in ED Medications  aspirin chewable tablet 324 mg (324 mg Oral Given 07/10/22 1852)  acetaminophen (TYLENOL) tablet 650 mg (650 mg Oral Given 07/11/22 0304)  meclizine (ANTIVERT) tablet 25 mg (25 mg Oral Given 07/11/22 0304)    ED Course/ Medical Decision Making/ A&P                           Medical Decision Making Dizziness and uncontrolled HTN x 4 days  Amount and/or Complexity of Data Reviewed External Data Reviewed: notes.    Details: Previous notes reviewed  Labs: ordered.    Details: All labs reviewed: CBC has normal white count, hemoglobin and platelet count.  Normal sodium 136, normal potassium and normal creatinine.  2 negative troponins    Radiology: ordered and independent interpretation performed.    Details: MRI with acute stroke  Discussion of management or test interpretation with external provider(s): Dr. Iver NestleBhagat who will see the patient  Dr. Arlean HoppingHowerter who will admit the patient   Risk Decision regarding hospitalization.    Final Clinical Impression(s) / ED Diagnoses Final diagnoses:  Cerebrovascular accident (CVA), unspecified mechanism (HCC)   The patient appears reasonably stabilized for admission considering the current resources, flow, and capabilities available in the ED at this time, and I doubt any other [facility] requiring further screening and/or treatment in the ED prior to admission.  Rx / DC Orders ED Discharge Orders     None         Aneesha Holloran, MD 07/11/22 [phone]

## 2022-07-11 NOTE — ED Notes (Signed)
ED provider at bedside at this time 

## 2022-07-11 NOTE — Progress Notes (Signed)
  Carryover admission to the Day Admitter.  I discussed this case with the EDP, Dr.  Randal Buba.  Per these discussions:   This is a 61 year old male with history of poorly controlled hypertension, cocaine abuse, who is being admitted for acute versus subacute ischemic stroke after presenting with 4 days of dizziness/vertigo, with home blood pressures over that time noted to be elevated into the 190s to low 200s.   MRI of the brain performed in the ED today suggestive of acute to subacute ischemic stroke.   Patient with a documented history of cocaine abuse, noted to have last used cocaine in 2022, evaluated by a urinary drug screen that was positive for cocaine at that time.  he denies any notable use of cocaine.  Urinary drug screen has been ordered, result currently pending.  EDP discussed patient's case with the on-call neurologist, Dr. Curly Shores, Who recommended admission to the hospitalist service for further evaluation and management acute to subacute ischemic stroke.  Dr. Curly Shores conveyed that neurology will formally consult, with additional recommendations pending at this time.  I have placed an order for inpatient admit for further evaluation/management of the above.  I have placed some additional preliminary admit orders via the adult multi-morbid admission order set. I have also completed the acute ischemic CVA order set , including  orders for hemoglobin A1c, lipid panel, PT/OT consults.  Additionally, ordered as needed IV labetalol for systolic blood pressure greater than 027 or diastolic blood pressure greater than 120 mmHg.    Babs Bertin, DO Hospitalist

## 2022-07-11 NOTE — ED Notes (Signed)
Meal tray set up for pt. Pt eating dinner.

## 2022-07-11 NOTE — ED Notes (Signed)
Pt needed assistance with standing and pivoting over to the bed at this time. Pt unable to do so by himself

## 2022-07-12 DIAGNOSIS — I6381 Other cerebral infarction due to occlusion or stenosis of small artery: Principal | ICD-10-CM

## 2022-07-12 DIAGNOSIS — I639 Cerebral infarction, unspecified: Secondary | ICD-10-CM | POA: Diagnosis not present

## 2022-07-12 LAB — GLUCOSE, CAPILLARY
Glucose-Capillary: 165 mg/dL — ABNORMAL HIGH (ref 70–99)
Glucose-Capillary: 145 mg/dL — ABNORMAL HIGH (ref 70–99)
Glucose-Capillary: 279 mg/dL — ABNORMAL HIGH (ref 70–99)
Glucose-Capillary: 255 mg/dL — ABNORMAL HIGH (ref 70–99)
Glucose-Capillary: 303 mg/dL — ABNORMAL HIGH (ref 70–99)

## 2022-07-12 LAB — RETICULOCYTES
Immature Retic Fract: 14.7 % (ref 2.3–15.9)
RBC.: 5.31 MIL/uL (ref 4.22–5.81)
Retic Count, Absolute: 95.6 10*3/uL (ref 19.0–186.0)
Retic Ct Pct: 1.8 % (ref 0.4–3.1)

## 2022-07-12 LAB — TSH: TSH: 1.81 u[IU]/mL (ref 0.350–4.500)

## 2022-07-12 MED ORDER — INSULIN ASPART FLEXPEN 100 UNIT/ML ~~LOC~~ SOPN: 16.0000 [IU] | PEN_INJECTOR | Freq: Three times a day (TID) | SUBCUTANEOUS | Status: DC

## 2022-07-12 MED ORDER — SERTRALINE HCL 100 MG PO TABS
100.0000 mg | ORAL_TABLET | Freq: Every day | ORAL | Status: DC
  Administered 2022-07-13: 100 mg via ORAL
  Filled 2022-07-12: qty 1

## 2022-07-12 MED ORDER — INSULIN GLARGINE-YFGN 100 UNIT/ML ~~LOC~~ SOLN
23.0000 [IU] | Freq: Every day | SUBCUTANEOUS | Status: DC
  Administered 2022-07-12: 23 [IU] via SUBCUTANEOUS
  Filled 2022-07-12 (×3): qty 0.23

## 2022-07-12 MED ORDER — INSULIN ASPART 100 UNIT/ML IJ SOLN: 8.0000 [IU] | Freq: Three times a day (TID) | INTRAMUSCULAR | Status: DC

## 2022-07-12 MED ORDER — INSULIN ASPART 100 UNIT/ML IJ SOLN: 16.0000 [IU] | Freq: Three times a day (TID) | INTRAMUSCULAR | Status: DC

## 2022-07-12 MED ORDER — INSULIN ASPART 100 UNIT/ML IJ SOLN
4.0000 [IU] | Freq: Three times a day (TID) | INTRAMUSCULAR | Status: DC
  Administered 2022-07-13: 4 [IU] via SUBCUTANEOUS

## 2022-07-12 NOTE — TOC Initial Note (Signed)
Transition of Care [facility]) - Initial/Assessment Note    Patient Details  Name: Charles Orr MRN: [id number] Date of Birth: Nov 16, 1960  Transition of Care [facility] CM/SW Contact:    Pollie Friar, RN Phone Number: 07/12/2022, 11:01 AM  Clinical Narrative:                 Pt is from home alone. He has been approved for an aide through his medicaid for 2 hours/ day--7 days/ week. He currently doesn't have an aide as he had to fire the last one. CM has talked with [employer] and confirmed he is still eligible for an aide. CM has helped him update [employer] with the new agency he would like to use.   Pt is active with [employer] a branch of [facility]. He states they are supposed to check on him weekly but the visits often get cancelled.   Pt uses his medicaid transport for medical appointments. This he arranges through [facility].  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 He admits to mistakes with his medications at times do to his vision. CM asked about using bubble packets and he is interested. His pharmacy is [facility] and they do the bubble packs. They will start this services with his next delivery of medications.  Pt states he has issues getting [facility]Dr Ronnald Ramp office to get refills to his pharmacy before medications run totally out. CM has left voicemail with the PCP office to see what can be done to remedy this issue.   TOC following for further d/c needs.     Expected Discharge Plan: Home/Self Care Barriers to Discharge: Continued Medical Work up   Patient Goals and CMS Choice        Expected Discharge Plan and Services Expected Discharge Plan: Home/Self Care   Discharge Planning Services: CM Consult   Living arrangements for the past 2 months: Single Family Home                                      Prior Living Arrangements/Services Living arrangements for the past 2 months: Single Family Home Lives with:: Self Patient language and need for  interpreter reviewed:: Yes Do you feel safe going back to the place where you live?: Yes        Care giver support system in place?: No (comment) Current home services: DME (cane) Criminal Activity/Legal Involvement Pertinent to Current Situation/Hospitalization: No - Comment as needed  Activities of Daily Living      Permission Sought/Granted                  Emotional Assessment Appearance:: Appears stated age Attitude/Demeanor/Rapport: Engaged Affect (typically observed): Accepting Orientation: : Oriented to Self, Oriented to Place, Oriented to  Time, Oriented to Situation   Psych Involvement: No (comment)  Admission diagnosis:  CVA (cerebral vascular accident) ([place]) [I63.9] Acute ischemic stroke ([place]) [I63.9] Acute CVA (cerebrovascular accident) [facility]) [I63.9] Cerebrovascular accident (CVA), unspecified mechanism ([place]) [I63.9] Patient Active Problem List   Diagnosis Date Noted   CVA (cerebral vascular accident) ([place]) 07/11/2022   Polysubstance abuse ([place]) 07/11/2022   Palpitations 07/11/2022   Microcytosis 07/11/2022   MDD (major depressive disorder), recurrent severe, 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   Disorganized schizophrenia ([place])    Dizziness 11/11/2018   Bipolar disorder, curr episode mixed, severe, with psychotic features ([place]) 10/08/2017   Polysubstance (including opioids) dependence  with physiological dependence (HCC) 10/06/2017   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   Chest pain 07/10/2017   Angina pectoris (HCC) 07/09/2017   Tobacco abuse 07/09/2017   Hypertension 07/09/2017   Type II diabetes mellitus (HCC)    GERD (gastroesophageal reflux disease)    Chronic lower back pain    Poorly controlled diabetes mellitus (HCC)    Hyperlipidemia with target LDL less than 70    Schizoaffective disorder, bipolar type (HCC) 06/16/2017   Depression 06/15/2017   Pneumonia 06/11/2017   Community acquired  pneumonia of right lower lobe of lung    Hyperglycemia    Localized edema    Shortness of breath 06/10/2017   PCP:  [facility]:   [facility] - [place], [place] - [address] [place] [address] Phone: [phone] Fax: [phone]     Social Determinants of Health (SDOH) Interventions    Readmission Risk Interventions     No data to display

## 2022-07-12 NOTE — Progress Notes (Addendum)
STROKE TEAM PROGRESS NOTE   INTERVAL HISTORY No family at the bedside. Patient is alert and oriented.  Since 10/26 he has been staggering and having dizziness. 2 days ago it got so severe that he decided to get evaluated. Patient c/o not getting his medications.  MRI shows a small corpus callosal lacunar infarct.  Patient states he has quit smoking cigarettes and cocaine for the last 2 weeks.  Urine drug screen on admission was negative Vitals:   07/12/22 0300 07/12/22 0417 07/12/22 0419 07/12/22 0746  BP:   124/85 127/62  Pulse:   96 83  Resp: 13  (!) 21 20  Temp:   97.8 F (36.6 C) 98 F (36.7 C)  TempSrc:   Oral Oral  SpO2:    100%  Weight:  125.1 kg     CBC:  Recent Labs  Lab 07/10/22 1858  WBC 8.0  HGB 13.8  HCT 41.9  MCV 78.2*  PLT 274   Basic Metabolic Panel:  Recent Labs  Lab 07/10/22 1858  NA 136  K 4.2  CL 105  CO2 20*  GLUCOSE 201*  BUN 15  CREATININE 1.23  CALCIUM 9.7   Lipid Panel:  Recent Labs  Lab 07/11/22 1806  CHOL 100  TRIG 76  HDL 32*  CHOLHDL 3.1  VLDL 15  LDLCALC 53   HgbA1c:  Recent Labs  Lab 07/11/22 1806  HGBA1C 8.9*   Urine Drug Screen:  Recent Labs  Lab 07/11/22 0559  LABOPIA NONE DETECTED  COCAINSCRNUR NONE DETECTED  LABBENZ NONE DETECTED  AMPHETMU NONE DETECTED  THCU NONE DETECTED  LABBARB NONE DETECTED    Alcohol Level No results for input(s): "ETH" in the last 168 hours.  IMAGING past 24 hours ECHOCARDIOGRAM COMPLETE  Result Date: 07/11/2022    ECHOCARDIOGRAM REPORT   Patient Name:   Charles Orr Date of Exam: 07/11/2022 Medical Rec #:  [id number]   Height:       74.0 in Accession #:    [id number]  Weight:       264.0 lb Date of Birth:  07/13/1961  BSA:          2.446 m Patient Age:    60 years    BP:           189/98 mmHg Patient Gender: M           HR:           85 bpm. Exam Location:  Inpatient Procedure: 2D Echo, Cardiac Doppler, Color Doppler and Intracardiac            Opacification Agent Indications:     Stroke  History:        Patient has prior history of Echocardiogram examinations, most                 recent 04/17/2019. Previous Myocardial Infarction; Risk                 Factors:Hypertension, Diabetes, Dyslipidemia and Current Smoker.                 History of substance abuse.  Sonographer:    Dolin Ludwig RDCS (AE) Referring Phys: [id number] [facility]  Sonographer Comments: Technically difficult study due to poor echo windows, no subcostal window and patient is obese. IMPRESSIONS  1. Left ventricular ejection fraction, by estimation, is 55 to 60%. The left ventricle has normal function. The left ventricle has no regional wall motion abnormalities. There is moderate concentric  left ventricular hypertrophy. Left ventricular diastolic parameters are consistent with Grade I diastolic dysfunction (impaired relaxation).  2. Right ventricular systolic function was not well visualized. The right ventricular size is not well visualized. Tricuspid regurgitation signal is inadequate for assessing PA pressure.  3. The mitral valve is normal in structure. No evidence of mitral valve regurgitation.  4. The aortic valve is tricuspid. There is mild calcification of the aortic valve. Aortic valve regurgitation is not visualized. Aortic valve sclerosis/calcification is present, without any evidence of aortic stenosis. Comparison(s): No significant change from prior study. Prior images reviewed side by side. FINDINGS  Left Ventricle: Left ventricular ejection fraction, by estimation, is 55 to 60%. The left ventricle has normal function. The left ventricle has no regional wall motion abnormalities. Definity contrast agent was given IV to delineate the left ventricular  endocardial borders. The left ventricular internal cavity size was normal in size. There is moderate concentric left ventricular hypertrophy. Left ventricular diastolic parameters are consistent with Grade I diastolic dysfunction (impaired relaxation). Normal left  ventricular filling pressure. Right Ventricle: The right ventricular size is not well visualized. Right vetricular wall thickness was not well visualized. Right ventricular systolic function was not well visualized. Tricuspid regurgitation signal is inadequate for assessing PA pressure. Left Atrium: Left atrial size was normal in size. Right Atrium: Right atrial size was not well visualized. Pericardium: There is no evidence of pericardial effusion. Mitral Valve: The mitral valve is normal in structure. Mild to moderate mitral annular calcification. No evidence of mitral valve regurgitation. Tricuspid Valve: The tricuspid valve is not well visualized. Tricuspid valve regurgitation is not demonstrated. Aortic Valve: The aortic valve is tricuspid. There is mild calcification of the aortic valve. Aortic valve regurgitation is not visualized. Aortic valve sclerosis/calcification is present, without any evidence of aortic stenosis. Aortic valve mean gradient measures 1.0 mmHg. Aortic valve peak gradient measures 2.9 mmHg. Aortic valve area, by VTI measures 3.56 cm. Pulmonic Valve: The pulmonic valve was not well visualized. Pulmonic valve regurgitation is not visualized. Aorta: The aortic root is normal in size and structure. IAS/Shunts: The interatrial septum was not well visualized.  LEFT VENTRICLE PLAX 2D LVIDd:         3.90 cm   Diastology LVIDs:         2.70 cm   LV e' medial:    5.55 cm/s LV PW:         1.50 cm   LV E/e' medial:  10.4 LV IVS:        1.40 cm   LV e' lateral:   6.31 cm/s LVOT diam:     2.20 cm   LV E/e' lateral: 9.1 LV SV:         50 LV SV Index:   20 LVOT Area:     3.80 cm  RIGHT VENTRICLE RV Basal diam:  2.90 cm TAPSE (M-mode): 1.9 cm LEFT ATRIUM           Index        RIGHT ATRIUM           Index LA diam:      3.40 cm 1.39 cm/m   RA Area:     10.10 cm LA Vol (A2C): 33.6 ml 13.74 ml/m  RA Volume:   19.20 ml  7.85 ml/m LA Vol (A4C): 18.7 ml 7.65 ml/m  AORTIC VALVE AV Area (Vmax):    3.47 cm  AV Area (Vmean):   3.48 cm AV Area (VTI):  3.56 cm AV Vmax:           85.60 cm/s AV Vmean:          55.400 cm/s AV VTI:            0.140 m AV Peak Grad:      2.9 mmHg AV Mean Grad:      1.0 mmHg LVOT Vmax:         78.10 cm/s LVOT Vmean:        50.700 cm/s LVOT VTI:          0.131 m LVOT/AV VTI ratio: 0.94  AORTA Ao Root diam: 3.70 cm MITRAL VALVE MV Area (PHT): 2.61 cm    SHUNTS MV Decel Time: 291 msec    Systemic VTI:  0.13 m MV E velocity: 57.60 cm/s  Systemic Diam: 2.20 cm MV A velocity: 75.30 cm/s MV E/A ratio:  0.76 Mihai Croitoru MD Electronically signed by Thurmon Fair MD Signature Date/Time: 07/11/2022/4:55:04 PM    Final    CT ANGIO HEAD NECK W WO CM (CODE STROKE)  Result Date: 07/11/2022 CLINICAL DATA:  Stroke, follow up EXAM: CT ANGIOGRAPHY HEAD AND NECK TECHNIQUE: Multidetector CT imaging of the head and neck was performed using the standard protocol during bolus administration of intravenous contrast. Multiplanar CT image reconstructions and MIPs were obtained to evaluate the vascular anatomy. Carotid stenosis measurements (when applicable) are obtained utilizing NASCET criteria, using the distal internal carotid diameter as the denominator. RADIATION DOSE REDUCTION: This exam was performed according to the departmental dose-optimization program which includes automated exposure control, adjustment of the mA and/or kV according to patient size and/or use of iterative reconstruction technique. CONTRAST:  28mL OMNIPAQUE IOHEXOL 350 MG/ML SOLN COMPARISON:  None Available. FINDINGS: CTA NECK FINDINGS Aortic arch: Great vessel origins are patent without significant stenosis. Right carotid system: No evidence of dissection, stenosis (50% or greater), or occlusion. Left carotid system: No evidence of dissection, stenosis (50% or greater), or occlusion. Vertebral arteries: Codominant. No evidence of dissection, stenosis (50% or greater), or occlusion. Skeleton: No acute findings. Other neck: No acute  findings. Upper chest: Visualized lung apices are clear. Review of the MIP images confirms the above findings CTA HEAD FINDINGS Anterior circulation: Bilateral intracranial ICAs, MCAs, and ACAs are patent without proximal hemodynamically significant stenosis. Posterior circulation: Bilateral intradural vertebral arteries, basilar artery and bilateral posterior cerebral arteries are patent without proximal hemodynamically significant stenosis. Venous sinuses: As permitted by contrast timing, patent. Review of the MIP images confirms the above findings IMPRESSION: No emergent large vessel occlusion or proximal hemodynamically significant stenosis. Electronically Signed   By: Feliberto Harts M.D.   On: 07/11/2022 08:32    PHYSICAL EXAM  Temp:  [97.8 F (36.6 C)-98.8 F (37.1 C)] 97.9 F (36.6 C) (11/08 1130) Pulse Rate:  [79-96] 83 (11/08 1130) Resp:  [13-23] 15 (11/08 1130) BP: (99-189)/(62-111) 181/81 (11/08 1130) SpO2:  [96 %-100 %] 100 % (11/08 1130) Weight:  [125.1 kg] 125.1 kg (11/08 0417)  General -obese middle-aged African-American male, in no apparent distress. Cardiovascular - Regular rhythm and rate.  Mental Status -  Level of arousal and orientation to time, place, and person were intact. Language including expression, naming, repetition, comprehension was assessed and found intact. Attention span and concentration were normal. Recent and remote memory were intact. Fund of Knowledge was assessed and was intact.  Cranial Nerves II - XII - II - Visual field intact OU. III, IV, VI - Extraocular movements intact. V -  Facial sensation intact bilaterally. VII - Facial movement intact bilaterally. VIII - Hearing & vestibular intact bilaterally. X - Palate elevates symmetrically. XI - Chin turning & shoulder shrug intact bilaterally. XII - Tongue protrusion intact.  Motor Strength - The patient's strength was normal in all extremities and pronator drift was absent.  Bulk was  normal and fasciculations were absent.   Motor Tone - Muscle tone was assessed at the neck and appendages and was normal.  Sensory - Light touch, temperature/pinprick were assessed and were symmetrical.    Coordination - The patient had normal movements in the hands and feet with no ataxia or dysmetria.  Tremor was absent.  Gait and Station - deferred.  ASSESSMENT/PLAN Charles Orr is a 61 y.o. male with history of   hypertension, hyperlipidemia, MI, diabetes mellitus type 2, GERD, and polysubstance abuse(opiates, cocaine, tobacco) presents with complaints of dizziness.  He reports feeling like everything is moving around him even at rest for the last 11 days. Patient reports that symptoms feel different from vertigo.  Associated symptoms include left leg weakness, left-sided headache with pain running down his neck, worsening vision, decreased appetite, heart palpitations, and substernal chest pains.   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  CTA head & neck No emergent large vessel occlusion or proximal hemodynamically significant stenosis.  MRI  Small acute or subacute infarct in the body of the corpus callosum and adjacent left frontal white matter  2D Echo EF 55-60%. moderate concentric left ventricular  hypertrophy. Left ventricular  diastolic parameters are consistent with Grade I diastolic dysfunction  LDL 53 HgbA1c 8.9 VTE prophylaxis - SCD's    Diet   Diet heart healthy/carb modified Room service appropriate? Yes; Fluid consistency: Thin   No antithrombotic prior to admission, now on aspirin 81 mg daily and clopidogrel 75 mg daily. For 3 weeks than ASA alone  Will need follow up with outpatient neurology in 2 months Therapy recommendations:  pending Disposition:  pending  Hypertension Home  meds:  clonidine, losartan,  Stable Permissive hypertension (OK if < 220/120) but gradually normalize in 5-7 days Long-term BP goal normotensive  Hyperlipidemia Home meds:  lipitor 80mg , resumed in hospital LDL 53, goal < 70 Continue statin at discharge  Diabetes type II UnControlled Home meds:  insulin HgbA1c 8.9, goal < 7.0 CBGs Recent Labs    07/11/22 2318 07/12/22 0612 07/12/22 0744  GLUCAP 149* 165* 145*    SSI  Other Stroke Risk Factors  Cigarette smoker advised to stop smoking  ETOH use, alcohol level <10, advised to drink no more than 2 drink(s) a day Hx Substance abuse -  Obesity, Body mass index is 35.41 kg/m., BMI >/= 30 associated with increased stroke risk, recommend weight loss, diet and exercise as appropriate  Coronary artery disease   Other Active Problems Gerd Schizoaffective disorder, bipolar type   Hospital day # 1  Gevena Martenise Wolfe DNP, ACNPC-AG    STROKE MD NOTE :  I have personally obtained history,examined this patient, reviewed notes, independently viewed imaging studies, participated in medical decision making and plan of care.ROS completed by me personally and pertinent positives fully documented  I have made any additions or clarifications directly to the above note. Agree with note above.  Patient presented with dizziness with elevated blood pressure for the last several days and MRI  scan shows a small corpus callosum lacunar infarct likely from small vessel disease.  Neurological exam is nonfocal.  Recommend aspirin Plavix for 3 weeks followed by aspirin alone and aggressive risk factor modification.  Patient complemented on quitting smoking and cocaine and encouraged to continue to do so.  Mobilize out of bed.  Therapy consults.  Greater than 50% time during this 50-minute visit was counseling and coordination of care about his lacunar stroke and discussion about evaluation, prevention and treatment and answering questions discussed with Dr.  Irene Limbo,  Delia Heady, MD Medical Director [facility] Pager: [phone] 07/12/2022 3:51 PM  To contact Stroke Continuity provider, please refer to [email or web address]. After hours, contact General Neurology

## 2022-07-12 NOTE — Progress Notes (Signed)
Mobility Specialist: Progress Note   07/12/22 1728  Mobility  Activity Ambulated with assistance in room  Level of Assistance Moderate assist, patient does 50-74%  Assistive Device Cane  Distance Ambulated (ft) 30 ft  Activity Response Tolerated fair  Mobility Referral Yes  $Mobility charge 1 Mobility   Post-Mobility: 85 HR, 150/91 (106) BP, 99% SpO2  Pt received in the bed and agreeable to mobility. Mod I with bed mobility and modA to stand. C/o dizziness during ambulation with LOB x1 requiring minA to correct. Pt assisted back to bed per request. Pt has call bell and phone in reach. Bed alarm is on.   Ajay Strubel Mobility Specialist Please contact via SecureChat or Rehab office at [phone]

## 2022-07-12 NOTE — Hospital Course (Addendum)
61 year old man PMH including diabetes, polysubstance abuse including cocaine, presented with dizziness.  Admitted for acute stroke.

## 2022-07-12 NOTE — Evaluation (Signed)
Physical Therapy Evaluation and Discharge Patient Details Name: Charles Orr MRN: [id number] DOB: 01/15/1961 Today's Date: 07/12/2022  History of Present Illness  61 y.o. male presents 07/11/22 with complaints of dizziness.  He reports feeling like everything is moving around him even at rest for the last 11 days. MRI of the brain showed small acute or subacute infarct in the body of the corpus callosum and adjacent left frontal white matter.  PMH significant of hypertension, hyperlipidemia, MI, diabetes mellitus type 2, GERD, and polysubstance abuse(opiates, cocaine, tobacco), Schizoaffective disorder, bipolar type  Clinical Impression   Patient evaluated by Physical Therapy with no further acute PT needs identified. All education has been completed and the patient has no further questions. At baseline pt uses cane due to decr vision and slightly impaired balance (related to his vision). Patient ambulated modified independent with cane and up/down steps with rail and cane. PT is signing off. Thank you for this referral.        Recommendations for follow up therapy are one component of a multi-disciplinary discharge planning process, led by the attending physician.  Recommendations may be updated based on patient status, additional functional criteria and insurance authorization.  Follow Up Recommendations No PT follow up      Assistance Recommended at Discharge PRN  Patient can return home with the following  Assist for transportation    Equipment Recommendations None recommended by PT  Recommendations for Other Services       Functional Status Assessment Patient has not had a recent decline in their functional status     Precautions / Restrictions Precautions Precautions: Fall Precaution Comments: decr vision resulting in decr balance (espeically in unfamiliar territory) Restrictions Weight Bearing Restrictions: No      Mobility  Bed Mobility Overal bed mobility:  Independent                  Transfers Overall transfer level: Independent Equipment used: Straight cane Transfers: Sit to/from Stand Sit to Stand: Modified independent (Device/Increase time)           General transfer comment: with cane    Ambulation/Gait Ambulation/Gait assistance: Supervision, Modified independent (Device/Increase time) Gait Distance (Feet): 150 Feet Assistive device: Straight cane Gait Pattern/deviations: Step-through pattern, Decreased stride length   Gait velocity interpretation: >2.62 ft/sec, indicative of community ambulatory   General Gait Details: pt tends to stay close to walls, but not reaching out for support (+use of cane)  Stairs Stairs: Yes Stairs assistance: Modified independent (Device/Increase time) Stair Management: One rail Left, Step to pattern, Forwards, With cane Number of Stairs: 2 General stair comments: no difficulty  Wheelchair Mobility    Modified Rankin (Stroke Patients Only) Modified Rankin (Stroke Patients Only) Pre-Morbid Rankin Score: Slight disability Modified Rankin: Slight disability     Balance Overall balance assessment: Mild deficits observed, not formally tested                                           Pertinent Vitals/Pain Pain Assessment Pain Assessment: No/denies pain    Home Living Family/patient expects to be discharged to:: Private residence Living Arrangements: Alone Available Help at Discharge: Family;Available PRN/intermittently Type of Home: House Home Access: Stairs to enter Entrance Stairs-Rails: Can reach both Entrance Stairs-Number of Steps: 8   Home Layout: One level Home Equipment: Gil[place] Mor - single point      Prior Function Prior  Level of Function : Needs assist             Mobility Comments: walks with cane in unfamiliar environments or outside; inside does not use device; reports decr balance related to his decr vision ADLs Comments: son does  grocery shopping     Hand Dominance        Extremity/Trunk Assessment   Upper Extremity Assessment Upper Extremity Assessment: Overall WFL for tasks assessed    Lower Extremity Assessment Lower Extremity Assessment: Overall WFL for tasks assessed    Cervical / Trunk Assessment Cervical / Trunk Assessment: Normal  Communication   Communication: No difficulties  Cognition Arousal/Alertness: Awake/alert Behavior During Therapy: WFL for tasks assessed/performed Overall Cognitive Status: Within Functional Limits for tasks assessed                                          General Comments      Exercises     Assessment/Plan    PT Assessment Patient does not need any further PT services  PT Problem List         PT Treatment Interventions      PT Goals (Current goals can be found in the Care Plan section)  Acute Rehab PT Goals Patient Stated Goal: go home today PT Goal Formulation: All assessment and education complete, DC therapy    Frequency       Co-evaluation               AM-PAC PT "6 Clicks" Mobility  Outcome Measure Help needed turning from your back to your side while in a flat bed without using bedrails?: None Help needed moving from lying on your back to sitting on the side of a flat bed without using bedrails?: None Help needed moving to and from a bed to a chair (including a wheelchair)?: None Help needed standing up from a chair using your arms (e.g., wheelchair or bedside chair)?: None Help needed to walk in hospital room?: None Help needed climbing 3-5 steps with a railing? : None 6 Click Score: 24    End of Session Equipment Utilized During Treatment: Gait belt Activity Tolerance: Patient tolerated treatment well Patient left: in chair;with call bell/phone within reach;with chair alarm set Nurse Communication: Mobility status PT Visit Diagnosis: Other abnormalities of gait and mobility (R26.89)    Time: [id number] PT  Time Calculation (min) (ACUTE ONLY): 35 min   Charges:   PT Evaluation $PT Eval Low Complexity: 1 Low PT Treatments $Gait Training: 8-22 mins         Arby Barrette, PT Acute Rehabilitation Services  Office [phone]   Rexanne Mano 07/12/2022, 10:45 AM

## 2022-07-12 NOTE — Plan of Care (Signed)

## 2022-07-12 NOTE — Inpatient Diabetes Management (Addendum)
Inpatient Diabetes Program Recommendations  AACE/ADA: New Consensus Statement on Inpatient Glycemic Control (2015)  Target Ranges:  Prepandial:   less than 140 mg/dL      Peak postprandial:   less than 180 mg/dL (1-2 hours)      Critically ill patients:  140 - 180 mg/dL   Lab Results  Component Value Date   GLUCAP 279 (H) 07/12/2022   HGBA1C 8.9 (H) 07/11/2022    Review of Glycemic Control  Latest Reference Range & Units 07/12/22 07:44 07/12/22 11:47  Glucose-Capillary 70 - 99 mg/dL 109 (H) 323 (H)  (H): Data is abnormally high  Diabetes history: DM2 Outpatient Diabetes medications: Lanyus 30 units BID, Novolog 16 units TID, Jardiance 25 mg QD, Janumet 50-1000 mg QD Current orders for Inpatient glycemic control: Novolog 0-15 units TID and 0-5 QHS, Jardiance 25 units QD  Inpatient Diabetes Program Recommendations:    Novolog 4 units TID with meals if consumes at least 50%.  Spoke with patient at bedside.  Reviewed patient's current A1c of 8.9% (average BG of 209 mg/dL). Explained what a A1c is and what it measures. Also reviewed goal A1c with patient, importance of good glucose control @ home, and blood sugar goals.  He is current with his PCP.  He states his last A1C was 8.4% in September.  He denies difficulties obtaining insulins or DM supplies.  He dose not skip doses.  He has poor vision and he counts clicks on the insulin pen for dosing.    He admits to drinking grapefruit juice and orange juice.    Educated on The Plate Method, CHO's, portion control, CBGs at home fasting and mid afternoon, F/U with PCP every 3 months, bring meter to PCP office, long and short term complications of uncontrolled BG, and importance of exercise.   He is very concerned that he is not getting his rapid insulin 16 units TID.  Explained to him that I have recommended a portion of his meal coverage be added TID.     Will continue to follow while inpatient.  Thank you, Dulce Sellar, MSN,  CDCES Diabetes Coordinator Inpatient Diabetes Program [phone] (team pager from 8a-5p)

## 2022-07-12 NOTE — Progress Notes (Signed)
OT Cancellation Note  Patient Details Name: Charles Orr MRN: [id number] DOB: 1960-10-11   Cancelled Treatment:    Reason Eval/Treat Not Completed: OT screened, no needs identified, will sign off Patient working with PT earlier, per PT patient is back to baseline, is cognitively appropriate, and does not require OT evaluation at this time. Please re-consult if further OT needs arise.   Pollyann Glen E. Jadie Allington, OTR/L Acute Rehabilitation Services [phone]   Cherlyn Cushing 07/12/2022, 10:24 AM

## 2022-07-12 NOTE — Progress Notes (Addendum)
  Progress Note   Patient: Charles Orr YQM:[id number] DOB: 1960-12-30 DOA: 07/10/2022     1 DOS: the patient was seen and examined on 07/12/2022   Brief hospital course: 61 year old man PMH including diabetes, polysubstance abuse including cocaine, presented with dizziness.  Admitted for acute stroke.  Assessment and Plan: CVA --Subacute.   --No antithrombotic prior to admission, now on aspirin 81 mg daily and clopidogrel 75 mg daily. For 3 weeks than ASA alone  --Will need follow up with outpatient neurology in 2 months --no therapy needs --appreciate neurology, plan home tomorrow   Essential hypertension --Continue home blood pressure regimen given subacute.   Uncontrolled diabetes mellitus type 2, with long-term use of insulin --Hemoglobin A1c 8.9.  Home regimen includes glargine 30 units twice daily, NovoLog 16 units 3 times daily, Janumet 50-1000 mg twice daily, --confirmed regimen with patient, CBGs 200s, will resume 16 units meal coverage and 23 units long-acting, confirmed with patient   Palpitations chest pain --appears resolved --TSH and echocardiogram reassuring   Microcytosis without anemia --anemia panel unrevealing, follow-up as an outpatient   Schizoaffective disorder, bipolar type   Hyperlipidemia -Atorvastatin 80 mg daily   Tobacco abuse Patient reports that he is wanting to quit tobacco at this time. -Nicotine patch offered -Continue to counsel on need of cessation of tobacco use   History of polysubstance abuse UDS negative.  Patient reports last use of cocaine was likely 5 to 6 months ago.  Last positive screen for cocaine was in 2022. -Continue to encourage cessation of illicit drug use.   GERD -Continue pharmacy substitution of Protonix   Obesity -Check height and weight  Smoker      Subjective:  Feels better  Dizzy from high blood sugar  Physical Exam: Vitals:   07/12/22 0419 07/12/22 0746 07/12/22 1130 07/12/22 1550  BP: 124/85  127/62 (!) 181/81 (!) 113/55  Pulse: 96 83 83 88  Resp: (!) 21 20 15 17   Temp: 97.8 F (36.6 C) 98 F (36.7 C) 97.9 F (36.6 C) 98 F (36.7 C)  TempSrc: Oral Oral Oral Oral  SpO2:  100% 100% 100%  Weight:       Physical Exam Vitals reviewed.  Constitutional:      General: He is not in acute distress.    Appearance: He is not ill-appearing or toxic-appearing.  Cardiovascular:     Rate and Rhythm: Normal rate and regular rhythm.     Heart sounds: No murmur heard. Pulmonary:     Effort: Pulmonary effort is normal. No respiratory distress.     Breath sounds: No wheezing, rhonchi or rales.  Neurological:     General: No focal deficit present.     Mental Status: He is alert.  Psychiatric:        Mood and Affect: Mood normal.        Behavior: Behavior normal.     Data Reviewed: CBG stable  Family Communication: none  Disposition: Status is: Inpatient Remains inpatient appropriate because: stroke  Planned Discharge Destination: Home    Time spent: 35 minutes  Author: , MD 07/12/2022 4:22 PM  For on call review [email or web address].

## 2022-07-13 DIAGNOSIS — I639 Cerebral infarction, unspecified: Secondary | ICD-10-CM | POA: Diagnosis not present

## 2022-07-13 LAB — GLUCOSE, CAPILLARY: Glucose-Capillary: 215 mg/dL — ABNORMAL HIGH (ref 70–99)

## 2022-07-13 MED ORDER — INSULIN ASPART 100 UNIT/ML IJ SOLN: 16.0000 [IU] | Freq: Three times a day (TID) | INTRAMUSCULAR | Status: DC

## 2022-07-13 MED ORDER — CLOPIDOGREL BISULFATE 75 MG PO TABS: 75.0000 mg | ORAL_TABLET | Freq: Every day | ORAL | 0 refills | Status: DC

## 2022-07-13 MED ORDER — ASPIRIN 81 MG PO CHEW: 81.0000 mg | CHEWABLE_TABLET | Freq: Every day | ORAL | Status: DC

## 2022-07-13 MED ORDER — NICOTINE 21 MG/24HR TD PT24: 21.0000 mg | MEDICATED_PATCH | Freq: Every day | TRANSDERMAL | 2 refills | Status: DC

## 2022-07-13 MED ORDER — ESOMEPRAZOLE MAGNESIUM 40 MG PO CPDR: 40.0000 mg | DELAYED_RELEASE_CAPSULE | Freq: Every day | ORAL | Status: DC

## 2022-07-13 NOTE — Discharge Summary (Addendum)
Physician Discharge Summary   Patient: Charles Orr MRN: [id number] DOB: February 26, 1961  Admit date:     07/10/2022  Discharge date: 07/13/22  Discharge Physician: Brendia Sacks   PCP: [facility]   Recommendations at discharge:  CVA --Subacute.   --No antithrombotic prior to admission, now on aspirin 81 mg daily and clopidogrel 75 mg daily. For 3 weeks than ASA alone  --Will need follow up with outpatient neurology in 2 months --no therapy needs   Discharge Diagnoses: Principal Problem:   CVA (cerebral vascular accident) (HCC) Active Problems:   Hypertension   Type II diabetes mellitus (HCC)   Chest pain   Palpitations   Microcytosis   Schizoaffective disorder, bipolar type (HCC)   Hyperlipidemia with target LDL less than 70   Tobacco abuse   Polysubstance abuse (HCC)   GERD (gastroesophageal reflux disease)  Resolved Problems:   * No resolved hospital problems. *  Hospital Course: 60 year old man PMH including diabetes, polysubstance abuse including cocaine, presented with dizziness.  Admitted for acute stroke.  CVA --Subacute.   --No antithrombotic prior to admission, now on aspirin 81 mg daily and clopidogrel 75 mg daily. For 3 weeks than ASA alone  --Will need follow up with outpatient neurology in 2 months --no therapy needs   Essential hypertension --Continue home blood pressure regimen given subacute.   Uncontrolled diabetes mellitus type 2, with long-term use of insulin, with hyperglycemia --Hemoglobin A1c 8.9.  Home regimen includes glargine, NovoLog meal coverage   Palpitations chest pain --resolved --TSH and echocardiogram reassuring   Microcytosis without anemia --anemia panel unrevealing, follow-up as an outpatient   Schizoaffective disorder, bipolar type   Hyperlipidemia -Atorvastatin 80 mg daily   Tobacco abuse Patient reports that he is wanting to quit tobacco at this time. -Nicotine patch Rx   History of polysubstance  abuse UDS negative.  Patient reports last use of cocaine was likely 5 to 6 months ago.  Last positive screen for cocaine was in 2022. -Continue to encourage cessation of illicit drug use.   GERD -Continue pharmacy substitution of Protonix   Obesity -Check height and weight   Smoker      Consultants:  Neurology   Procedures performed:  None    Disposition: Home Diet recommendation:  Discharge Diet Orders (From admission, onward)     Start     Ordered   07/13/22 0000  Diet - low sodium heart healthy        07/13/22 0935   07/13/22 0000  Diet Carb Modified        07/13/22 0935           Cardiac and Carb modified diet DISCHARGE MEDICATION: Allergies as of 07/13/2022   No Known Allergies      Medication List     STOP taking these medications    busPIRone 10 MG tablet Commonly known as: BUSPAR       TAKE these medications    aspirin 81 MG chewable tablet Chew 1 tablet (81 mg total) by mouth daily.   atorvastatin 20 MG tablet Commonly known as: LIPITOR Take 20 mg by mouth at bedtime.   cloNIDine 0.1 MG tablet Commonly known as: CATAPRES Take 0.1 mg by mouth in the morning and at bedtime.   clopidogrel 75 MG tablet Commonly known as: PLAVIX Take 1 tablet (75 mg total) by mouth daily.   dorzolamide-timolol 2-0.5 % ophthalmic solution Commonly known as: COSOPT Place 1 drop into the right eye 2 (two) times daily.  ergocalciferol 1.25 MG (50000 UT) capsule Commonly known as: VITAMIN D2 Take 50,000 Units by mouth every Monday.   esomeprazole 40 MG capsule Commonly known as: NEXIUM Take 1 capsule (40 mg total) by mouth at bedtime. Hold until done taking Plavix, then resume Nexium. What changed: additional instructions   gabapentin 600 MG tablet Commonly known as: NEURONTIN Take 600 mg by mouth in the morning, at noon, and at bedtime.   hydrOXYzine 50 MG capsule Commonly known as: VISTARIL Take 100 mg by mouth See admin instructions. Take 100  mg by mouth at bedtime and an additional 100 mg once a day as needed for anxiety   Insulin Aspart FlexPen 100 UNIT/ML Commonly known as: NOVOLOG Inject 16 Units into the skin 3 (three) times daily before meals.   insulin glargine 100 UNIT/ML injection Commonly known as: LANTUS Inject 0.22 mLs (22 Units total) into the skin in the morning. What changed: Another medication with the same name was removed. Continue taking this medication, and follow the directions you see here.   Janumet 50-1000 MG tablet Generic drug: sitaGLIPtin-metformin Take 1 tablet by mouth in the morning and at bedtime.   Jardiance 25 MG Tabs tablet Generic drug: empagliflozin Take 25 mg by mouth every morning.   losartan 25 MG tablet Commonly known as: COZAAR Take 1 tablet (25 mg total) by mouth daily. What changed: how much to take   nicotine 21 mg/24hr patch Commonly known as: NICODERM CQ - dosed in mg/24 hours Place 1 patch (21 mg total) onto the skin daily.   risperiDONE 1 MG tablet Commonly known as: RISPERDAL Take 1 tablet (1 mg) by mouth in the mornings & 2 tablets (2 mg) at bedtime: For mood control What changed:  how much to take how to take this when to take this additional instructions   sertraline 100 MG tablet Commonly known as: ZOLOFT Take 1 tablet (100 mg total) by mouth daily.   traZODone 100 MG tablet Commonly known as: DESYREL Take 100 mg by mouth at bedtime.        Follow-up Information     [facility] Follow up.   Why: Call the agency if you have not heard from them in 48 hours  If this agency doesnt work try: [facility] [phone] Contact information: [facility] [phone]        [facility]. Schedule an appointment as soon as possible for a visit in 1 week(s).   Contact information: [address] [place] [address] [phone]         [facility] Follow up.   Specialty: Neurology Why: Office will call you  with an appointment Contact information: [address] [place] [id number] [phone]               Feels better  Discharge Exam: Ceasar Mons[employer]iled [employer]ights   07/12/22 0417 07/13/22 0500  Weight: 125.1 kg 125.1 kg   Physical Exam Vitals reviewed.  Constitutional:      General: He is not in acute distress.    Appearance: He is not ill-appearing or toxic-appearing.  Cardiovascular:     Rate and Rhythm: Normal rate and regular rhythm.     Heart sounds: No murmur heard. Pulmonary:     Effort: No respiratory distress.     Breath sounds: No wheezing, rhonchi or rales.  Neurological:     Mental Status: He is alert.  Psychiatric:        Mood and Affect: Mood  normal.        Behavior: Behavior normal.      Condition at discharge: good  The results of significant diagnostics from this hospitalization (including imaging, microbiology, ancillary and laboratory) are listed below for reference.   Imaging Studies: ECHOCARDIOGRAM COMPLETE  Result Date: 07/11/2022    ECHOCARDIOGRAM REPORT   Patient Name:   KIETH HARTIS Date of Exam: 07/11/2022 Medical Rec #:  [id number]   Height:       74.0 in Accession #:    [id number]  Weight:       264.0 lb Date of Birth:  [birth year]  BSA:          2.446 m Patient Age:    60 years    BP:           189/98 mmHg Patient Gender: M           HR:           85 bpm. Exam Location:  Inpatient Procedure: 2D Echo, Cardiac Doppler, Color Doppler and Intracardiac            Opacification Agent Indications:    Stroke  History:        Patient has prior history of Echocardiogram examinations, most                 recent 04/17/2019. Previous Myocardial Infarction; Risk                 Factors:Hypertension, Diabetes, Dyslipidemia and Current Smoker.                 History of substance abuse.  Sonographer:    Olson Ludwig RDCS (AE) Referring Phys: [id number] [facility] A SMITH  Sonographer Comments: Technically difficult study due to poor echo windows, no subcostal  window and patient is obese. IMPRESSIONS  1. Left ventricular ejection fraction, by estimation, is 55 to 60%. The left ventricle has normal function. The left ventricle has no regional wall motion abnormalities. There is moderate concentric left ventricular hypertrophy. Left ventricular diastolic parameters are consistent with Grade I diastolic dysfunction (impaired relaxation).  2. Right ventricular systolic function was not well visualized. The right ventricular size is not well visualized. Tricuspid regurgitation signal is inadequate for assessing PA pressure.  3. The mitral valve is normal in structure. No evidence of mitral valve regurgitation.  4. The aortic valve is tricuspid. There is mild calcification of the aortic valve. Aortic valve regurgitation is not visualized. Aortic valve sclerosis/calcification is present, without any evidence of aortic stenosis. Comparison(s): No significant change from prior study. Prior images reviewed side by side. FINDINGS  Left Ventricle: Left ventricular ejection fraction, by estimation, is 55 to 60%. The left ventricle has normal function. The left ventricle has no regional wall motion abnormalities. Definity contrast agent was given IV to delineate the left ventricular  endocardial borders. The left ventricular internal cavity size was normal in size. There is moderate concentric left ventricular hypertrophy. Left ventricular diastolic parameters are consistent with Grade I diastolic dysfunction (impaired relaxation). Normal left ventricular filling pressure. Right Ventricle: The right ventricular size is not well visualized. Right vetricular wall thickness was not well visualized. Right ventricular systolic function was not well visualized. Tricuspid regurgitation signal is inadequate for assessing PA pressure. Left Atrium: Left atrial size was normal in size. Right Atrium: Right atrial size was not well visualized. Pericardium: There is no evidence of pericardial  effusion. Mitral Valve: The mitral valve is normal in structure. Mild to moderate  mitral annular calcification. No evidence of mitral valve regurgitation. Tricuspid Valve: The tricuspid valve is not well visualized. Tricuspid valve regurgitation is not demonstrated. Aortic Valve: The aortic valve is tricuspid. There is mild calcification of the aortic valve. Aortic valve regurgitation is not visualized. Aortic valve sclerosis/calcification is present, without any evidence of aortic stenosis. Aortic valve mean gradient measures 1.0 mmHg. Aortic valve peak gradient measures 2.9 mmHg. Aortic valve area, by VTI measures 3.56 cm. Pulmonic Valve: The pulmonic valve was not well visualized. Pulmonic valve regurgitation is not visualized. Aorta: The aortic root is normal in size and structure. IAS/Shunts: The interatrial septum was not well visualized.  LEFT VENTRICLE PLAX 2D LVIDd:         3.90 cm   Diastology LVIDs:         2.70 cm   LV e' medial:    5.55 cm/s LV PW:         1.50 cm   LV E/e' medial:  10.4 LV IVS:        1.40 cm   LV e' lateral:   6.31 cm/s LVOT diam:     2.20 cm   LV E/e' lateral: 9.1 LV SV:         50 LV SV Index:   20 LVOT Area:     3.80 cm  RIGHT VENTRICLE RV Basal diam:  2.90 cm TAPSE (M-mode): 1.9 cm LEFT ATRIUM           Index        RIGHT ATRIUM           Index LA diam:      3.40 cm 1.39 cm/m   RA Area:     10.10 cm LA Vol (A2C): 33.6 ml 13.74 ml/m  RA Volume:   19.20 ml  7.85 ml/m LA Vol (A4C): 18.7 ml 7.65 ml/m  AORTIC VALVE AV Area (Vmax):    3.47 cm AV Area (Vmean):   3.48 cm AV Area (VTI):     3.56 cm AV Vmax:           85.60 cm/s AV Vmean:          55.400 cm/s AV VTI:            0.140 m AV Peak Grad:      2.9 mmHg AV Mean Grad:      1.0 mmHg LVOT Vmax:         78.10 cm/s LVOT Vmean:        50.700 cm/s LVOT VTI:          0.131 m LVOT/AV VTI ratio: 0.94  AORTA Ao Root diam: 3.70 cm MITRAL VALVE MV Area (PHT): 2.61 cm    SHUNTS MV Decel Time: 291 msec    Systemic VTI:  0.13 m MV E  velocity: 57.60 cm/s  Systemic Diam: 2.20 cm MV A velocity: 75.30 cm/s MV E/A ratio:  0.76 Mihai Croitoru MD Electronically signed by Thurmon Fair MD Signature Date/Time: 07/11/2022/4:55:04 PM    Final    CT ANGIO HEAD NECK W WO CM (CODE STROKE)  Result Date: 07/11/2022 CLINICAL DATA:  Stroke, follow up EXAM: CT ANGIOGRAPHY HEAD AND NECK TECHNIQUE: Multidetector CT imaging of the head and neck was performed using the standard protocol during bolus administration of intravenous contrast. Multiplanar CT image reconstructions and MIPs were obtained to evaluate the vascular anatomy. Carotid stenosis measurements (when applicable) are obtained utilizing NASCET criteria, using the distal internal carotid diameter as the denominator. RADIATION  DOSE REDUCTION: This exam was performed according to the departmental dose-optimization program which includes automated exposure control, adjustment of the mA and/or kV according to patient size and/or use of iterative reconstruction technique. CONTRAST:  59mL OMNIPAQUE IOHEXOL 350 MG/ML SOLN COMPARISON:  None Available. FINDINGS: CTA NECK FINDINGS Aortic arch: Great vessel origins are patent without significant stenosis. Right carotid system: No evidence of dissection, stenosis (50% or greater), or occlusion. Left carotid system: No evidence of dissection, stenosis (50% or greater), or occlusion. Vertebral arteries: Codominant. No evidence of dissection, stenosis (50% or greater), or occlusion. Skeleton: No acute findings. Other neck: No acute findings. Upper chest: Visualized lung apices are clear. Review of the MIP images confirms the above findings CTA HEAD FINDINGS Anterior circulation: Bilateral intracranial ICAs, MCAs, and ACAs are patent without proximal hemodynamically significant stenosis. Posterior circulation: Bilateral intradural vertebral arteries, basilar artery and bilateral posterior cerebral arteries are patent without proximal hemodynamically significant  stenosis. Venous sinuses: As permitted by contrast timing, patent. Review of the MIP images confirms the above findings IMPRESSION: No emergent large vessel occlusion or proximal hemodynamically significant stenosis. Electronically Signed   By: Feliberto Harts M.D.   On: 07/11/2022 08:32   CT Head Wo Contrast  Result Date: 07/10/2022 CLINICAL DATA:  Dizziness EXAM: CT HEAD WITHOUT CONTRAST TECHNIQUE: Contiguous axial images were obtained from the base of the skull through the vertex without intravenous contrast. RADIATION DOSE REDUCTION: This exam was performed according to the departmental dose-optimization program which includes automated exposure control, adjustment of the mA and/or kV according to patient size and/or use of iterative reconstruction technique. COMPARISON:  MRI 07/10/2022, head CT 05/21/2022 FINDINGS: Brain: No acute territorial infarction, hemorrhage or intracranial mass. The MRI demonstrated small acute to subacute infarcts involving the corpus callosum and left frontal white matter is not well demonstrated by CT. Stable ventricle size. Vascular: No hyperdense vessels.  No unexpected calcification Skull: Normal. Negative for fracture or focal lesion. Sinuses/Orbits: Hyperdense left globe is chronic. Other: None IMPRESSION: No definite CT evidence for acute intracranial abnormality. The MRI demonstrated small acute to subacute infarct is not well seen on CT. Electronically Signed   By: Jasmine Pang M.D.   On: 07/10/2022 23:04   MR BRAIN WO CONTRAST  Result Date: 07/10/2022 CLINICAL DATA:  Neuro deficit, acute, stroke suspected dizziness, LUE weakness EXAM: MRI HEAD WITHOUT CONTRAST TECHNIQUE: Multiplanar, multiecho pulse sequences of the brain and surrounding structures were obtained without intravenous contrast. COMPARISON:  MRI 08/27/2018. FINDINGS: Brain: Small acute or subacute infarct in the body of the corpus callosum and adjacent left frontal white matter. Mild associated edema  without significant mass effect. Mild additional scattered T2/FLAIR hyperintensities in the white matter, nonspecific but compatible with chronic microvascular ischemic disease. No evidence of acute hemorrhage, mass lesion, midline shift, or hydrocephalus. Vascular: Major arterial flow voids are maintained at the skull base. Skull and upper cervical spine: Normal marrow signal. Sinuses/Orbits: Negative. Other: No mastoid effusions. IMPRESSION: Small acute or subacute infarct in the body of the corpus callosum and adjacent left frontal white matter. Electronically Signed   By: Feliberto Harts M.D.   On: 07/10/2022 20:48   DG Chest 1 View  Result Date: 07/10/2022 CLINICAL DATA:  Hypertension, dizziness, weakness EXAM: CHEST  1 VIEW COMPARISON:  05/21/2022 FINDINGS: Lungs are clear.  No pleural effusion or pneumothorax. The heart is normal in size. IMPRESSION: No active disease. Electronically Signed   By: Charline Bills M.D.   On: 07/10/2022 19:25  Microbiology: No results found for this or any previous visit.  Labs: CBC: Recent Labs  Lab 07/10/22 1858  WBC 8.0  HGB 13.8  HCT 41.9  MCV 78.2*  PLT 274   Basic Metabolic Panel: Recent Labs  Lab 07/10/22 1858  NA 136  K 4.2  CL 105  CO2 20*  GLUCOSE 201*  BUN 15  CREATININE 1.23  CALCIUM 9.7   Liver Function Tests: Recent Labs  Lab 07/10/22 1858  AST 19  ALT 20  ALKPHOS 90  BILITOT 0.5  PROT 7.1  ALBUMIN 3.7   CBG: Recent Labs  Lab 07/12/22 0744 07/12/22 1147 07/12/22 1614 07/12/22 2125 07/13/22 0607  GLUCAP 145* 279* 255* 303* 215*    Discharge time spent: less than 30 minutes.  Signed: Brendia Sacks, MD [facility] 07/13/2022

## 2022-07-13 NOTE — TOC Transition Note (Signed)
Transition of Care [facility]) - CM/SW Discharge Note   Patient Details  Name: Charles Orr MRN: [id number] Date of Birth: Aug 29, 1961  [facility] CM/SW Contact:  Kermit Balo, RN Phone Number: 07/13/2022, 10:42 AM   Clinical Narrative:    Pt discharging home with self care.  Pt has aide service arranged and they will reach out to the patient for the first visit.  Pt has bubble packets to start for his medications starting with next medication refills. Pt states he has a friend that is going to provide transport home today.    Final next level of care: Home/Self Care Barriers to Discharge: No Barriers Identified   Patient Goals and CMS Choice        Discharge Placement                       Discharge Plan and Services   Discharge Planning Services: CM Consult                                 Social Determinants of Health (SDOH) Interventions     Readmission Risk Interventions     No data to display

## 2022-07-13 NOTE — Progress Notes (Signed)
Mobility Specialist: Progress Note   07/13/22 1011  Mobility  Activity Ambulated with assistance in hallway  Level of Assistance Minimal assist, patient does 75% or more  Assistive Device Cane  Distance Ambulated (ft) 160 ft  Activity Response Tolerated well  Mobility Referral Yes  $Mobility charge 1 Mobility   Pt received sitting EOB and agreeable to mobility. Required minA to stand as well as during ambulation for balance secondary to decreased vision. Pt utilizing SPC and hallway rail for stability during session, no c/o throughout. LOB x1 but pt able to self correct. Pt sitting EOB after session with RN present in the room.   Carah Barrientes Mobility Specialist Please contact via SecureChat or Rehab office at [phone]

## 2022-07-27 ENCOUNTER — Emergency Department (HOSPITAL_COMMUNITY): Payer: Medicaid Other

## 2022-07-27 ENCOUNTER — Emergency Department (HOSPITAL_COMMUNITY)
Admission: EM | Admit: 2022-07-27 | Discharge: 2022-07-27 | Disposition: A | Discharge status: Home / Self Care | Payer: Medicaid Other | Attending: Emergency Medicine | Admitting: Emergency Medicine

## 2022-07-27 DIAGNOSIS — E119 Type 2 diabetes mellitus without complications: Secondary | ICD-10-CM | POA: Insufficient documentation

## 2022-07-27 DIAGNOSIS — H538 Other visual disturbances: Secondary | ICD-10-CM | POA: Insufficient documentation

## 2022-07-27 DIAGNOSIS — Z7982 Long term (current) use of aspirin: Secondary | ICD-10-CM | POA: Insufficient documentation

## 2022-07-27 DIAGNOSIS — I6782 Cerebral ischemia: Secondary | ICD-10-CM | POA: Insufficient documentation

## 2022-07-27 DIAGNOSIS — Z87891 Personal history of nicotine dependence: Secondary | ICD-10-CM | POA: Diagnosis not present

## 2022-07-27 DIAGNOSIS — Y900 Blood alcohol level of less than 20 mg/100 ml: Secondary | ICD-10-CM | POA: Diagnosis not present

## 2022-07-27 DIAGNOSIS — Z7984 Long term (current) use of oral hypoglycemic drugs: Secondary | ICD-10-CM | POA: Insufficient documentation

## 2022-07-27 DIAGNOSIS — Z7902 Long term (current) use of antithrombotics/antiplatelets: Secondary | ICD-10-CM | POA: Diagnosis not present

## 2022-07-27 DIAGNOSIS — Z79899 Other long term (current) drug therapy: Secondary | ICD-10-CM | POA: Diagnosis not present

## 2022-07-27 DIAGNOSIS — R202 Paresthesia of skin: Secondary | ICD-10-CM | POA: Insufficient documentation

## 2022-07-27 DIAGNOSIS — I10 Essential (primary) hypertension: Secondary | ICD-10-CM | POA: Diagnosis not present

## 2022-07-27 DIAGNOSIS — R200 Anesthesia of skin: Secondary | ICD-10-CM | POA: Insufficient documentation

## 2022-07-27 DIAGNOSIS — R519 Headache, unspecified: Secondary | ICD-10-CM | POA: Insufficient documentation

## 2022-07-27 DIAGNOSIS — Z794 Long term (current) use of insulin: Secondary | ICD-10-CM | POA: Insufficient documentation

## 2022-07-27 DIAGNOSIS — I672 Cerebral atherosclerosis: Secondary | ICD-10-CM | POA: Insufficient documentation

## 2022-07-27 DIAGNOSIS — R531 Weakness: Secondary | ICD-10-CM | POA: Insufficient documentation

## 2022-07-27 LAB — CBC
HCT: 40 % (ref 39.0–52.0)
Hemoglobin: 13.3 g/dL (ref 13.0–17.0)
MCH: 26.5 pg (ref 26.0–34.0)
MCHC: 33.3 g/dL (ref 30.0–36.0)
MCV: 79.7 fL — ABNORMAL LOW (ref 80.0–100.0)
Platelets: 220 10*3/uL (ref 150–400)
RBC: 5.02 MIL/uL (ref 4.22–5.81)
RDW: 14.2 % (ref 11.5–15.5)
WBC: 7.4 10*3/uL (ref 4.0–10.5)
nRBC: 0 % (ref 0.0–0.2)

## 2022-07-27 LAB — COMPREHENSIVE METABOLIC PANEL
ALT: 32 U/L (ref 0–44)
AST: 22 U/L (ref 15–41)
Albumin: 3.9 g/dL (ref 3.5–5.0)
Alkaline Phosphatase: 104 U/L (ref 38–126)
Anion gap: 10 (ref 5–15)
BUN: 20 mg/dL (ref 6–20)
CO2: 22 mmol/L (ref 22–32)
Calcium: 8.8 mg/dL — ABNORMAL LOW (ref 8.9–10.3)
Chloride: 105 mmol/L (ref 98–111)
Creatinine, Ser: 1.59 mg/dL — ABNORMAL HIGH (ref 0.61–1.24)
GFR, Estimated: 49 mL/min — ABNORMAL LOW (ref >60)
Glucose, Bld: 316 mg/dL — ABNORMAL HIGH (ref 70–99)
Potassium: 4.4 mmol/L (ref 3.5–5.1)
Sodium: 137 mmol/L (ref 135–145)
Total Bilirubin: 0.3 mg/dL (ref 0.3–1.2)
Total Protein: 6.8 g/dL (ref 6.5–8.1)

## 2022-07-27 LAB — DIFFERENTIAL
Abs Immature Granulocytes: 0.05 10*3/uL (ref 0.00–0.07)
Basophils Absolute: 0.1 10*3/uL (ref 0.0–0.1)
Basophils Relative: 1 %
Eosinophils Absolute: 0.3 10*3/uL (ref 0.0–0.5)
Eosinophils Relative: 4 %
Immature Granulocytes: 1 %
Lymphocytes Relative: 35 %
Lymphs Abs: 2.6 10*3/uL (ref 0.7–4.0)
Monocytes Absolute: 0.5 10*3/uL (ref 0.1–1.0)
Monocytes Relative: 7 %
Neutro Abs: 3.9 10*3/uL (ref 1.7–7.7)
Neutrophils Relative %: 52 %

## 2022-07-27 LAB — URINALYSIS, ROUTINE W REFLEX MICROSCOPIC
Bilirubin Urine: NEGATIVE
Glucose, UA: >=500 mg/dL — AB
Hgb urine dipstick: NEGATIVE
Ketones, ur: NEGATIVE mg/dL
Leukocytes,Ua: NEGATIVE
Nitrite: NEGATIVE
Protein, ur: NEGATIVE mg/dL
Specific Gravity, Urine: <1.005 — ABNORMAL LOW (ref 1.005–1.030)
pH: 5.5 (ref 5.0–8.0)

## 2022-07-27 LAB — URINALYSIS, MICROSCOPIC (REFLEX)

## 2022-07-27 LAB — RAPID URINE DRUG SCREEN, HOSP PERFORMED
Amphetamines: NOT DETECTED
Barbiturates: NOT DETECTED
Benzodiazepines: NOT DETECTED
Cocaine: NOT DETECTED
Opiates: NOT DETECTED
Tetrahydrocannabinol: NOT DETECTED

## 2022-07-27 LAB — I-STAT CHEM 8, ED
BUN: 23 mg/dL — ABNORMAL HIGH (ref 6–20)
Calcium, Ion: 1.11 mmol/L — ABNORMAL LOW (ref 1.15–1.40)
Chloride: 104 mmol/L (ref 98–111)
Creatinine, Ser: 1.6 mg/dL — ABNORMAL HIGH (ref 0.61–1.24)
Glucose, Bld: 320 mg/dL — ABNORMAL HIGH (ref 70–99)
HCT: 40 % (ref 39.0–52.0)
Hemoglobin: 13.6 g/dL (ref 13.0–17.0)
Potassium: 4.4 mmol/L (ref 3.5–5.1)
Sodium: 137 mmol/L (ref 135–145)
TCO2: 22 mmol/L (ref 22–32)

## 2022-07-27 LAB — ETHANOL: Alcohol, Ethyl (B): <10 mg/dL (ref <10)

## 2022-07-27 LAB — APTT: aPTT: 28 seconds (ref 24–36)

## 2022-07-27 LAB — PROTIME-INR
INR: 1.1 (ref 0.8–1.2)
Prothrombin Time: 13.7 seconds (ref 11.4–15.2)

## 2022-07-27 MED ORDER — METOCLOPRAMIDE HCL 5 MG/ML IJ SOLN
10.0000 mg | Freq: Once | INTRAMUSCULAR | Status: AC
  Administered 2022-07-27: 10 mg via INTRAVENOUS
  Filled 2022-07-27: qty 2

## 2022-07-27 MED ORDER — IOHEXOL 350 MG/ML SOLN
100.0000 mL | Freq: Once | INTRAVENOUS | Status: AC | PRN
  Administered 2022-07-27: 100 mL via INTRAVENOUS

## 2022-07-27 MED ORDER — GADOBUTROL 1 MMOL/ML IV SOLN
10.0000 mL | Freq: Once | INTRAVENOUS | Status: AC | PRN
  Administered 2022-07-27: 10 mL via INTRAVENOUS

## 2022-07-27 NOTE — Consult Note (Signed)
Neurology Consultation Reason for Consult: Visual change Referring Physician: Hyacinth Meeker, B  CC: Visual change  History is obtained from: Patient  HPI: Charles Orr is a 61 y.o. male with a history of hypertension, diabetes, MI who presents with right-sided tingling, visual change, headache.  He has very poor vision in his left eye at baseline, and states that now he is having trouble seeing much at all out of his right eye.  He also noticed some tingling that started earlier today.   Of note, he did have a recent stroke with workup in early November.  Interestingly, however, the symptoms that he presented with (left-sided tingling) did not actually correlate with the stroke that was found and it was felt to be an incidental finding.  He did have a headache with that episode as well.   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   Community acquired pneumonia of right lower lobe of lung    Degenerative disc disease, lumbar    Depression    DM (diabetes mellitus) (HCC)    Fibromyalgia    GERD (gastroesophageal reflux disease)    High cholesterol    Hyperglycemia    Hypertension    Localized edema    Myocardial infarction (HCC) 2002   Osteoarthritis    Other schizophrenia (HCC)    Pneumonia 1989   Polysubstance (including opioids) dependence with physiological dependence (HCC) 10/06/2017   Schizoaffective disorder, bipolar type (HCC) 06/16/2017   Schizophrenia (HCC)    Shortness of breath 06/10/2017   Tobacco abuse 07/09/2017   Type II diabetes mellitus (HCC)      Family History  Problem  Relation Age of Onset   Hypertension Mother    Diabetes Mother    Heart disease Father    Hypertension Sister    Diabetes Sister    Hypertension Brother    Diabetes Brother    Hypertension Sister    Diabetes Sister    Hypertension Brother    Diabetes Brother      Social History:  reports that he has been smoking cigarettes. He has a 19.00 pack-year smoking history. His smokeless tobacco use includes chew. He reports current alcohol use. He reports current drug use. Drug: Cocaine.   Exam: Current vital signs: BP 122/82 (BP Location: Right Arm)   Pulse 65   Temp 97.7 F (36.5 C) (Oral)   Resp 11   Ht 6\' 2"  (1.88 m)   Wt 126.5 kg   SpO2 99%   BMI 35.81 kg/m  Vital signs in last 24 hours: Temp:  [97.3 F (36.3 C)-97.7 F (36.5 C)] 97.7 F (36.5 C) (11/23 1815) Pulse Rate:  [63-69] 65 (11/23 1816) Resp:  [11-20] 11 (11/23 1816) BP: (122-156)/(68-99) 122/82 (11/23 1815) SpO2:  [99 %-100 %] 99 % (11/23 1816) Weight:  [126.5 kg] 126.5 kg (11/23 1517)   Physical Exam  Constitutional: Appears well-developed and well-nourished.  Psych: Affect appropriate to situation Eyes: No scleral injection HENT: No OP obstruction MSK: no joint deformities.  Cardiovascular: Normal rate and regular rhythm.  Respiratory: Effort normal, non-labored breathing GI: Soft.  No distension. There is no tenderness.  Skin: WDI  Neuro: Mental Status: Patient is awake, alert, oriented to person, place, month, year, and situation. Patient is able to give a clear and coherent history. No signs of aphasia or neglect Cranial Nerves: II: He is able to detect hand waving in his left eye. Pupils are reactive bilaterally III,IV, VI: EOMI  V: Facial sensation exam is inconsistent, sometimes decreased on the right and sometimes on the left VII: Facial movement is symmetric.  VIII: hearing is intact to voice X: Uvula elevates symmetrically XI: Shoulder shrug is symmetric. XII: tongue is midline  without atrophy or fasciculations.  Motor: He has good strength in bilateral upper extremities without drift, his left leg he has very little effort against gravity, right leg does drift. Sensory: Sensation is inconsistent, things feel colder on the right than left, but decreased to light touch and pinprick in the right Cerebellar: No definite ataxia     I have reviewed labs in epic and the results pertinent to this consultation are: Creatinine 1.59  I have reviewed the images obtained: CT/CTA-negative  Impression: 61 year old male with right-sided paresthesia, visual change, headache.  This is contralateral to the paresthesia with which he presented last month for which there was no clear explanation.  He has an inconsistent exam, I think there is a possibility of some nonorganic disease.  Given his recent stroke, this must be excluded, but if MRI is negative for recurrent stroke then I do not think he would benefit from a repeat stroke workup given his recent admission.  Complicated migraine is a possibility, and I do think I would give him Reglan, but nonorganic disease is also considerable possibility.  Another option given that he is complaining of eye pain and the visual change is predominantly monocular, would be that this does represent some ophthalmological pathology with superimposed embellishment.  Recommendations: 1) MRI brain 2) would discuss with ophthalmology 3) if MRI is negative, could consider treating as complicated migraine 4) if MRI is negative, no need to repeat stroke workup at this time.   Ritta Slot, MD [facility] [phone]  If 7pm- 7am, please page neurology on call as listed in AMION.

## 2022-07-27 NOTE — Discharge Instructions (Addendum)
Your MRI did not show findings concerning for a new stroke.  The neurology team evaluated you.  Your labs showed that your creatinine (a lab related to your kidney) was slightly elevated.  It looks like you have had elevation in this lab before today.  Please follow-up with your primary care provider and have them evaluate your labs and check on your kidney function.  You have an appointment with Dr. Vanessa Barbara, the ophthalmologist (eye doctor), on November 27 at 9:45 AM.  His contact information as above.  It would be very important that you attend this appointment.  If you have new or worsening symptoms, or have any concern that you are having an emergency, please call 911 or return to the emergency department.

## 2022-07-27 NOTE — ED Provider Notes (Signed)
[facility] EMERGENCY DEPARTMENT Provider Note   CSN: [id number] Arrival date & time: 07/27/22  1443  An emergency department physician performed an initial assessment on this suspected stroke patient at 1450.  History  Chief Complaint  Patient presents with   Weakness   Code Stroke    Charles Orr is a 62 y.o. male.   Weakness  The patient is a 61 year old male with past medical history of CVA (2 weeks ago), HTN, T2DM, schizoaffective disorder, polysubstance use presenting by EMS as a code stroke activation for evaluation of right-sided deficits.  The patient's last known normal was 1030 this morning.  He states that he took a nap and woke up with new vision changes in the right eye and right-sided numbness.  He denies recent falls, trauma, fevers, chest pain, shortness of breath, abdominal pain, or infectious symptoms.     Home Medications Prior to Admission medications   Medication Sig Start Date End Date Taking? Authorizing Provider  aspirin 81 MG chewable tablet Chew 1 tablet (81 mg total) by mouth daily. 07/13/22   Standley Brooking, MD  atorvastatin (LIPITOR) 20 MG tablet Take 20 mg by mouth at bedtime.    [provider]  cloNIDine (CATAPRES) 0.1 MG tablet Take 0.1 mg by mouth in the morning and at bedtime. 02/04/20   [provider]  clopidogrel (PLAVIX) 75 MG tablet Take 1 tablet (75 mg total) by mouth daily. 07/13/22   Standley Brooking, MD  dorzolamide-timolol (COSOPT) 22.3-6.8 MG/ML ophthalmic solution Place 1 drop into the right eye 2 (two) times daily.    [provider]  ergocalciferol (VITAMIN D2) 1.25 MG (50000 UT) capsule Take 50,000 Units by mouth every Monday.    [provider]  esomeprazole (NEXIUM) 40 MG capsule Take 1 capsule (40 mg total) by mouth at bedtime. Hold until done taking Plavix, then resume Nexium. 07/13/22   Standley Brooking, MD  gabapentin (NEURONTIN) 600 MG tablet Take 600 mg by mouth in  the morning, at noon, and at bedtime.    [provider]  hydrOXYzine (VISTARIL) 50 MG capsule Take 100 mg by mouth See admin instructions. Take 100 mg by mouth at bedtime and an additional 100 mg once a day as needed for anxiety 06/01/22   [provider]  Insulin Aspart FlexPen (NOVOLOG) 100 UNIT/ML Inject 16 Units into the skin 3 (three) times daily before meals. 06/09/22   [provider]  insulin glargine (LANTUS) 100 UNIT/ML injection Inject 0.22 mLs (22 Units total) into the skin in the morning. Patient not taking: Reported on 07/11/2022 02/22/21   Antonieta Pert, MD  JANUMET 50-1000 MG tablet Take 1 tablet by mouth in the morning and at bedtime. 07/06/22   [provider]  JARDIANCE 25 MG TABS tablet Take 25 mg by mouth every morning. 07/06/22   [provider]  losartan (COZAAR) 25 MG tablet Take 1 tablet (25 mg total) by mouth daily. Patient taking differently: Take 50 mg by mouth daily. 12/31/18 07/11/22  Quintella Reichert, MD  nicotine (NICODERM CQ - DOSED IN MG/24 HOURS) 21 mg/24hr patch Place 1 patch (21 mg total) onto the skin daily. 07/13/22   Standley Brooking, MD  risperiDONE (RISPERDAL) 1 MG tablet Take 1 tablet (1 mg) by mouth in the mornings & 2 tablets (2 mg) at bedtime: For mood control Patient taking differently: Take 1-2 mg by mouth See admin instructions. Take 1 mg by mouth in the  morning and 2 mg at bedtime 02/21/21   Antonieta Pertlary, Greg Lawson, MD  sertraline (ZOLOFT) 100 MG tablet Take 1 tablet (100 mg total) by mouth daily. 02/22/21   Antonieta Pertlary, Greg Lawson, MD  traZODone (DESYREL) 100 MG tablet Take 100 mg by mouth at bedtime.    [provider]      Allergies    Patient has no known allergies.    Review of Systems   Review of Systems  Neurological:  Positive for weakness.   See HPI  Physical Exam Updated Vital Signs BP 136/89   Pulse 65   Temp (!) 97.3 F (36.3 C) (Oral)   Resp 15   Ht 6\' 2"  (1.88 m)   Wt 126.5 kg    SpO2 99%   BMI 35.81 kg/m  Physical Exam Vitals and nursing note reviewed.  Constitutional:      General: He is not in acute distress.    Appearance: He is well-developed.  HENT:     Head: Normocephalic and atraumatic.  Eyes:     Conjunctiva/sclera: Conjunctivae normal.     Comments: Left eye blind.  Right eye blurry vision.  No blink to threat on initial assessment.  Cardiovascular:     Rate and Rhythm: Normal rate and regular rhythm.     Heart sounds: No murmur heard. Pulmonary:     Effort: Pulmonary effort is normal. No respiratory distress.     Breath sounds: Normal breath sounds.  Abdominal:     Palpations: Abdomen is soft.     Tenderness: There is no abdominal tenderness.  Musculoskeletal:        General: No swelling.     Cervical back: Neck supple.  Skin:    General: Skin is warm and dry.     Capillary Refill: Capillary refill takes less than 2 seconds.  Neurological:     Mental Status: He is alert.     Comments: Endorsing right hemibody numbness  Psychiatric:        Mood and Affect: Mood normal.     ED Results / Procedures / Treatments   Labs (all labs ordered are listed, but only abnormal results are displayed) Labs Reviewed  CBC - Abnormal; Notable for the following components:      Result Value   MCV 79.7 (*)    All other components within normal limits  COMPREHENSIVE METABOLIC PANEL - Abnormal; Notable for the following components:   Glucose, Bld 316 (*)    Creatinine, Ser 1.59 (*)    Calcium 8.8 (*)    GFR, Estimated 49 (*)    All other components within normal limits  I-STAT CHEM 8, ED - Abnormal; Notable for the following components:   BUN 23 (*)    Creatinine, Ser 1.60 (*)    Glucose, Bld 320 (*)    Calcium, Ion 1.11 (*)    All other components within normal limits  ETHANOL  PROTIME-INR  APTT  DIFFERENTIAL  RAPID URINE DRUG SCREEN, HOSP PERFORMED  URINALYSIS, ROUTINE W REFLEX MICROSCOPIC    EKG None  Radiology CT ANGIO HEAD NECK W WO  CM W PERF (CODE STROKE)  Result Date: 07/27/2022 CLINICAL DATA:  Provided history: Neuro deficit, acute, stroke suspected. EXAM: CT ANGIOGRAPHY HEAD AND NECK CT PERFUSION BRAIN TECHNIQUE: Multidetector CT imaging of the head and neck was performed using the standard protocol during bolus administration of intravenous contrast. Multiplanar CT image reconstructions and MIPs were obtained to evaluate the vascular anatomy. Carotid stenosis measurements (when applicable) are  obtained utilizing NASCET criteria, using the distal internal carotid diameter as the denominator. Multiphase CT imaging of the brain was performed following IV bolus contrast injection. Subsequent parametric perfusion maps were calculated using RAPID software. RADIATION DOSE REDUCTION: This exam was performed according to the departmental dose-optimization program which includes automated exposure control, adjustment of the mA and/or kV according to patient size and/or use of iterative reconstruction technique. CONTRAST:  OMNIPAQUE IOHEXOL 350 MG/ML SOLN COMPARISON:  Noncontrast head CT performed earlier today. CT angiogram head/neck 07/11/2022. FINDINGS: CTA NECK FINDINGS Aortic arch: Standard aortic branching. Streak and beam hardening artifact arising from a dense left-sided contrast bolus partially obscures the left subclavian artery. Within this limitation, there is no appreciable hemodynamically significant innominate or proximal subclavian artery stenosis. Right carotid system: CCA and ICA patent within the neck without stenosis or significant is chronic disease. Left carotid system: CCA and ICA patent within the neck without stenosis or significant atherosclerotic disease. Vertebral arteries: Vertebral arteries codominant and patent within the neck without stenosis or significant atherosclerotic disease. Skeleton: Cervical spondylosis. No acute fracture or aggressive osseous lesion. Other neck: No neck mass or cervical  lymphadenopathy. Upper chest: No consolidation within the imaged lung apices. Review of the MIP images confirms the above findings CTA HEAD FINDINGS Anterior circulation: The intracranial internal carotid arteries are patent The M1 middle cerebral arteries are patent. Atherosclerotic irregularity of the MCA branches, bilaterally. No M2 proximal branch occlusion or high-grade proximal stenosis is identified. The anterior cerebral arteries are patent. Redemonstrated sites of up to moderate stenosis within the left A2 segment. No intracranial aneurysm is identified. Posterior circulation: The intracranial vertebral arteries are patent. The basilar artery is patent. The posterior cerebral arteries are patent. Atherosclerotic irregularity of both vessels. Most notably, there is a severe stenosis within a left PCA branch at the P2/P3 junction which is new from the prior CTA of 07/11/2022 (series 13, image 23) (series 11, image 23). Posterior communicating arteries are diminutive or absent, bilaterally. Venous sinuses: Within the limitations of contrast timing, no convincing thrombus. Anatomic variants: As described. Review of the MIP images confirms the above findings CT Brain Perfusion Findings: CBF (<30%) Volume: 46mL Perfusion (Tmax>6.0s) volume: 68mL Mismatch Volume: 42mL Infarction Location:None identified. No emergent large vessel occlusion identified. This result and CTA head impression #3 communicated to Dr. Amada Jupiter at 3:23 pmon 11/23/2023by text page via the [facility] messaging system. IMPRESSION: CTA neck: The common carotid, internal carotid and vertebral arteries are patent within the neck without stenosis or significant atherosclerotic disease. CTA head: 1. No intracranial large vessel occlusion is identified. 2. Intracranial atherosclerotic disease, as described and most notably as follows. 3. Severe stenosis within a left PCA branch at the P2/P3 junction, new from the prior CTA of 07/11/2022. 4. Redemonstrated  sites of up to moderate stenosis within the left anterior cerebral artery A2 segment. CT perfusion head: The perfusion software identifies no core infarct. The perfusion software identifies no critically hypoperfused parenchyma (utilizing the Tmax>6 seconds threshold). No mismatch volume is reported. Electronically Signed   By: Jackey Loge D.O.   On: 07/27/2022 15:25   CT HEAD CODE STROKE WO CONTRAST  Result Date: 07/27/2022 CLINICAL DATA:  Code stroke. Provided history: Neuro deficit, acute, stroke suspected. EXAM: CT HEAD WITHOUT CONTRAST TECHNIQUE: Contiguous axial images were obtained from the base of the skull through the vertex without intravenous contrast. RADIATION DOSE REDUCTION: This exam was performed according to the departmental dose-optimization program which includes automated exposure control, adjustment of  the mA and/or kV according to patient size and/or use of iterative reconstruction technique. COMPARISON:  Head CT examinations 07/10/2022 and earlier. Brain MRI 07/10/2022. CT angiogram head/neck 07/11/2022. FINDINGS: Brain: Cerebral volume is normal. Known small subacute infarct within the left aspect of the callosal body and adjacent left frontal white matter, better appreciated on the prior brain MRI of 07/10/2022 (acute/early subacute at that time). Background mild patchy and ill-defined hypoattenuation within the cerebral white matter, nonspecific but compatible with chronic small vessel ischemic disease. There is no acute intracranial hemorrhage. No interval acute demarcated cortical infarct. No extra-axial fluid collection. No evidence of an intracranial mass. No midline shift. Vascular: No hyperdense vessel. Atherosclerotic calcifications. Skull: No fracture or aggressive osseous lesion. Sinuses/Orbits: No mass or acute finding within the imaged orbits. Postprocedural changes to the left globe. Minimal mucosal thickening within the left maxillary sinus. ASPECTS [facility] Stroke  Program Early CT Score) - Ganglionic level infarction (caudate, lentiform nuclei, internal capsule, insula, M1-M3 cortex): 7 - Supraganglionic infarction (M4-M6 cortex): 3 Total score (0-10 with 10 being normal): 10 (when discounting the known subacute infarct described above). These results were communicated to Dr. Amada Jupiter at 3:07 pmon 11/23/2023by text page via the [facility] messaging system. IMPRESSION: A known small subacute infarct within the left aspect of the callosal body and adjacent left frontal white matter was better appreciated on the prior brain MRI of 07/10/2022 (acute/early subacute at that time). No CT evidence of interval acute intracranial abnormality on today's exam. Background mild chronic small vessel ischemic changes within the cerebral white matter. Electronically Signed   By: Jackey Loge D.O.   On: 07/27/2022 15:10    Procedures Procedures    Medications Ordered in ED Medications  iohexol (OMNIPAQUE) 350 MG/ML injection 100 mL (100 mLs Intravenous Contrast Given 07/27/22 1508)    ED Course/ Medical Decision Making/ A&P Clinical Course as of 07/27/22 1623  Thu Jul 27, 2022  1619 S - PMH stroke 2 weeks ago, here with R sided vision changes. CT code stroke protocol did not show acute findings. Neuro has seen. Ophtho consulted per their request. MR brain ordered. Follow up MR brain and admit [AW]    Clinical Course User Index [AW] Linward Foster, MD                           Medical Decision Making The patient is a 60 year old male with past medical history of CVA (2 weeks ago), HTN, T2DM, schizoaffective disorder, polysubstance use presenting by EMS as a code stroke activation for evaluation of right-sided deficits.  The differential diagnosis considered includes: CVA, TIA, hypoglycemia, substance use, conversion disorder, metabolic dysfunction, endocrine dysfunction.  The patient's diagnostic workup included: I-STAT VBG with sodium 137, potassium 4.4, BUN 23, creatinine  1.6, glucose 320; UA and UDS which was pending; CBC with white blood cell count of 10.4 and hemoglobin of 13.3; PT/INR with PT 13.7 and INR 1.1; APTT 28; ethanol which was negative.  The patient also received a CMP with glucose elevated to 316 with creatinine of 1.59.  The patient also received an EKG which showed sinus rhythm without ischemic changes.  The patient's imaging included a CT head code stroke protocol which showed small subacute infarct in the left callosal body which was previously documented as prior admission; he also received CT angiography of the head and neck which showed no intracranial large vessel occlusion but did show severe stenosis within the left PCA.  Neurology  was consulted for further management.  Ophthalmology was also consulted at the recommendation of neurology.  At time of signout, care was transferred to the incoming resident with plan to follow-up on neurology's recommendations.   Amount and/or Complexity of Data Reviewed Independent Historian: EMS External Data Reviewed: labs, radiology and notes. Labs: ordered. Decision-making details documented in ED Course. Radiology: ordered and independent interpretation performed. Decision-making details documented in ED Course. Discussion of management or test interpretation with external provider(s): Neurology, ophthalmology   Patient's presentation is most consistent with acute presentation with potential threat to life or bodily function.         Final Clinical Impression(s) / ED Diagnoses Final diagnoses:  Generalized weakness    Rx / DC Orders ED Discharge Orders     None         Knox Saliva, MD 07/27/22 1627    Gerhard Munch, MD 07/28/22 1354

## 2022-07-27 NOTE — ED Triage Notes (Signed)
Patient arrives via ems c/o rt side numbness.LKW 1030 THIS am. Patient did have a stroke 2 weeks ago. Neurologist present at arrival. Bgl [phone]

## 2022-07-27 NOTE — ED Provider Notes (Signed)
  Physical Exam  Wt 126.5 kg   SpO2 100%   BMI 35.81 kg/m   Physical Exam Constitutional:      General: He is not in acute distress.    Appearance: He is not ill-appearing.  HENT:     Head: Normocephalic and atraumatic.     Nose: Nose normal.     Mouth/Throat:     Mouth: Mucous membranes are moist.     Pharynx: Oropharynx is clear.  Cardiovascular:     Rate and Rhythm: Normal rate and regular rhythm.     Pulses: Normal pulses.     Heart sounds: Normal heart sounds.  Pulmonary:     Effort: Pulmonary effort is normal.     Breath sounds: Normal breath sounds.  Abdominal:     General: There is no distension.     Tenderness: There is no abdominal tenderness. There is no guarding or rebound.  Neurological:     Mental Status: He is alert.     Cranial Nerves: No facial asymmetry.     Sensory: Sensation is intact.     Motor: No weakness, abnormal muscle tone or pronator drift.     Coordination: Romberg sign negative. Finger-Nose-Finger Test normal.     Gait: Gait is intact.     Procedures  Procedures  ED Course / MDM   Clinical Course as of 07/27/22 1928  Thu Jul 27, 2022  1619 S - PMH stroke 2 weeks ago, here with R sided vision changes. CT code stroke protocol did not show acute findings. Neuro has seen. Ophtho consulted per their request. MR brain ordered. Follow up MR brain and admit [AW]    Clinical Course User Index [AW] Linward Foster, MD   Medical Decision Making Amount and/or Complexity of Data Reviewed Radiology: ordered.  Risk Prescription drug management.   MRI resulted.  No signs of acute stroke.  Per neurology, patient does not require admission for further work-up as his MRI is negative for stroke.  On my reevaluation, patient is alert and oriented, vitals within normal limits.  Repeat neuro exam as above.  I spoke with ophthalmology who states they will be able to see the patient on Monday at 9:45 AM.  I discussed the imaging findings with the patient.   He is stable for discharge at this time, no imaging concerning for acute stroke.  Neurology endorses that patient is stable for discharge from their perspective.  Repeat vitals within normal limits.  Patient will follow up with ophthalmology as discussed.  The patient expressed understanding.  I encouraged him to continue taking his home blood pressure and diabetes medications and he stated he would.  Return precautions given.   Linward Foster, MD 07/28/22 2202    Eber Hong, MD 07/29/22 1022

## 2022-07-31 ENCOUNTER — Encounter (INDEPENDENT_AMBULATORY_CARE_PROVIDER_SITE_OTHER): Payer: Self-pay | Admitting: Ophthalmology

## 2022-07-31 LAB — CBG MONITORING, ED: Glucose-Capillary: 312 mg/dL — ABNORMAL HIGH (ref 70–99)

## 2022-07-31 NOTE — Progress Notes (Shared)
[facility] - Clinic Note  07/31/2022     CHIEF COMPLAINT Patient presents for No chief complaint on file.   HISTORY OF PRESENT ILLNESS: Charles Orr is a 61 y.o. male who presents to the clinic today for:     Referring physician: [facility] [address] [place],  [place] [address]  HISTORICAL INFORMATION:   Selected notes from the MEDICAL RECORD NUMBER ED f/u for vision changes OD, hx of low vision OS LEE:  Ocular Hx- PMH-    CURRENT MEDICATIONS: Current Outpatient Medications (Ophthalmic Drugs)  Medication Sig   dorzolamide-timolol (COSOPT) 22.3-6.8 MG/ML ophthalmic solution Place 1 drop into the right eye 2 (two) times daily.   No current facility-administered medications for this visit. (Ophthalmic Drugs)   Current Outpatient Medications (Other)  Medication Sig   aspirin 81 MG chewable tablet Chew 1 tablet (81 mg total) by mouth daily.   atorvastatin (LIPITOR) 20 MG tablet Take 20 mg by mouth at bedtime.   cloNIDine (CATAPRES) 0.1 MG tablet Take 0.1 mg by mouth in the morning and at bedtime.   clopidogrel (PLAVIX) 75 MG tablet Take 1 tablet (75 mg total) by mouth daily.   ergocalciferol (VITAMIN D2) 1.25 MG (50000 UT) capsule Take 50,000 Units by mouth every Monday.   esomeprazole (NEXIUM) 40 MG capsule Take 1 capsule (40 mg total) by mouth at bedtime. Hold until done taking Plavix, then resume Nexium.   gabapentin (NEURONTIN) 600 MG tablet Take 600 mg by mouth in the morning, at noon, and at bedtime.   hydrOXYzine (VISTARIL) 50 MG capsule Take 100 mg by mouth See admin instructions. Take 100 mg by mouth at bedtime and an additional 100 mg once a day as needed for anxiety   Insulin Aspart FlexPen (NOVOLOG) 100 UNIT/ML Inject 16 Units into the skin 3 (three) times daily before meals.   insulin glargine (LANTUS) 100 UNIT/ML injection Inject 0.22 mLs (22 Units total) into the skin in the morning. (Patient not taking: Reported on 07/11/2022)    JANUMET 50-1000 MG tablet Take 1 tablet by mouth in the morning and at bedtime.   JARDIANCE 25 MG TABS tablet Take 25 mg by mouth every morning.   losartan (COZAAR) 25 MG tablet Take 1 tablet (25 mg total) by mouth daily. (Patient taking differently: Take 50 mg by mouth daily.)   nicotine (NICODERM CQ - DOSED IN MG/24 HOURS) 21 mg/24hr patch Place 1 patch (21 mg total) onto the skin daily.   risperiDONE (RISPERDAL) 1 MG tablet Take 1 tablet (1 mg) by mouth in the mornings & 2 tablets (2 mg) at bedtime: For mood control (Patient taking differently: Take 1-2 mg by mouth See admin instructions. Take 1 mg by mouth in the morning and 2 mg at bedtime)   sertraline (ZOLOFT) 100 MG tablet Take 1 tablet (100 mg total) by mouth daily.   traZODone (DESYREL) 100 MG tablet Take 100 mg by mouth at bedtime.   No current facility-administered medications for this visit. 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) 10/06/2017   Community acquired pneumonia of right lower lobe of lung    Degenerative disc disease, lumbar    Depression    DM (diabetes mellitus) (HCC)    Fibromyalgia    GERD (gastroesophageal reflux disease)    High cholesterol    Hyperglycemia    Hypertension    Localized edema    Myocardial infarction (HCC) 2002   Osteoarthritis    Other schizophrenia (HCC)    Pneumonia 1989   Polysubstance (including opioids) dependence with physiological dependence (HCC) 10/06/2017   Schizoaffective disorder, bipolar type (HCC) 06/16/2017   Schizophrenia (HCC)     Shortness of breath 06/10/2017   Tobacco abuse 07/09/2017   Type II diabetes mellitus (HCC)    Past Surgical History:  Procedure Laterality Date   CARDIAC CATHETERIZATION  12/2006   [name] 01/18/2011   INGUINAL HERNIA REPAIR Right    LEFT HEART CATH AND CORONARY ANGIOGRAPHY N/A 07/11/2017   Procedure: LEFT HEART CATH AND CORONARY ANGIOGRAPHY;  Surgeon: Tonny Bollman, MD;  Location: [facility] INVASIVE CV LAB;  Service: Cardiovascular;  Laterality: N/A;   PARATHYROIDECTOMY     PERICARDIAL TAP  2000   ?; in [place]/notes 01/18/2011    FAMILY HISTORY Family History  Problem Relation Age of Onset   Hypertension Mother    Diabetes Mother    Heart disease Father    Hypertension Sister    Diabetes Sister    Hypertension Brother    Diabetes Brother    Hypertension Sister    Diabetes Sister    Hypertension Brother    Diabetes Brother     SOCIAL HISTORY Social History   Tobacco Use   Smoking status: Every Day    Packs/day: 0.50    Years: 38.00    Total pack years: 19.00    Types: Cigarettes   Smokeless tobacco: Current    Types: Chew  Vaping Use   Vaping Use: Never used  Substance Use Topics   Alcohol use: Yes    Comment: 06/12/2017 "stopped ~ 1 yr ago"    Drug use: Yes    Types: Cocaine    Comment: 06/12/2017 "stopped using cocaine ~ 1 yr ago; used marijuana when I was younger" last cocaine use 9 mths ago per pt on 06/15/17         OPHTHALMIC EXAM:  Not recorded     IMAGING AND PROCEDURES  Imaging and Procedures for 07/31/2022           ASSESSMENT/PLAN:    ICD-10-CM   1. Retinal edema  H35.81       1.  2.  3.  Ophthalmic Meds Ordered this visit:  No orders of the defined types were placed in this encounter.      No follow-ups on file.  There are no Patient Instructions on file for this visit.   Explained the diagnoses, plan, and follow up with the patient and they expressed understanding.  Patient expressed understanding of the importance of  proper follow up care.   This document serves as a record of services personally performed by Karie Chimera, MD, PhD. It was created on their behalf by Glee Arvin. Manson Passey, OA an ophthalmic technician. The creation of this record is the provider's dictation and/or activities during the visit.    Electronically signed by: Glee Arvin. Manson Passey, [place] 11.27.2023 8:05 AM   Karie Chimera, M.D., Ph.D. Diseases & Surgery of the Retina and Vitreous [facility]  Abbreviations: M myopia (nearsighted); A astigmatism; H hyperopia (farsighted); P presbyopia; Mrx spectacle prescription;  CTL contact lenses; OD right eye; OS left eye; OU both eyes  XT exotropia; ET esotropia; PEK punctate epithelial keratitis; PEE punctate epithelial erosions; DES dry eye syndrome; MGD meibomian gland dysfunction; ATs artificial tears; PFAT's preservative free artificial tears; [place] nuclear sclerotic cataract; PSC posterior subcapsular cataract; ERM epi-retinal membrane; PVD posterior vitreous detachment; RD retinal detachment; DM diabetes mellitus; DR diabetic retinopathy; NPDR non-proliferative diabetic retinopathy; PDR proliferative diabetic retinopathy; CSME clinically significant macular edema; DME diabetic macular edema; dbh dot blot hemorrhages; CWS cotton wool spot; POAG primary open angle glaucoma; C/D cup-to-disc ratio; HVF humphrey visual field; GVF goldmann visual field; OCT optical coherence tomography; IOP intraocular pressure; BRVO Branch retinal vein occlusion; CRVO central retinal vein occlusion; CRAO central retinal artery occlusion; BRAO branch retinal artery occlusion; RT retinal tear; SB scleral buckle; PPV pars plana vitrectomy; VH Vitreous hemorrhage; PRP panretinal laser photocoagulation; IVK intravitreal kenalog; VMT vitreomacular traction; MH Macular hole;  NVD neovascularization of the disc; NVE neovascularization elsewhere; AREDS age related eye disease study; ARMD age related macular  degeneration; POAG primary open angle glaucoma; EBMD epithelial/anterior basement membrane dystrophy; ACIOL anterior chamber intraocular lens; IOL intraocular lens; PCIOL posterior chamber intraocular lens; Phaco/IOL phacoemulsification with intraocular lens placement; [place] photorefractive keratectomy; LASIK laser assisted in situ keratomileusis; HTN hypertension; DM diabetes mellitus; COPD chronic obstructive pulmonary disease

## 2022-08-14 ENCOUNTER — Other Ambulatory Visit: Payer: Self-pay

## 2022-08-14 ENCOUNTER — Emergency Department (HOSPITAL_COMMUNITY): Payer: Medicaid Other

## 2022-08-14 ENCOUNTER — Emergency Department (HOSPITAL_COMMUNITY)
Admission: EM | Admit: 2022-08-14 | Discharge: 2022-08-15 | Disposition: A | Discharge status: Home / Self Care | Payer: Medicaid Other | Attending: Emergency Medicine | Admitting: Emergency Medicine

## 2022-08-14 DIAGNOSIS — R4781 Slurred speech: Secondary | ICD-10-CM | POA: Insufficient documentation

## 2022-08-14 DIAGNOSIS — I10 Essential (primary) hypertension: Secondary | ICD-10-CM | POA: Insufficient documentation

## 2022-08-14 DIAGNOSIS — Z7982 Long term (current) use of aspirin: Secondary | ICD-10-CM | POA: Diagnosis not present

## 2022-08-14 DIAGNOSIS — R531 Weakness: Secondary | ICD-10-CM | POA: Diagnosis not present

## 2022-08-14 DIAGNOSIS — R791 Abnormal coagulation profile: Secondary | ICD-10-CM | POA: Insufficient documentation

## 2022-08-14 DIAGNOSIS — Z794 Long term (current) use of insulin: Secondary | ICD-10-CM | POA: Diagnosis not present

## 2022-08-14 DIAGNOSIS — Z79899 Other long term (current) drug therapy: Secondary | ICD-10-CM | POA: Insufficient documentation

## 2022-08-14 DIAGNOSIS — R030 Elevated blood-pressure reading, without diagnosis of hypertension: Secondary | ICD-10-CM | POA: Diagnosis present

## 2022-08-14 LAB — CBC
HCT: 38.3 % — ABNORMAL LOW (ref 39.0–52.0)
Hemoglobin: 12.8 g/dL — ABNORMAL LOW (ref 13.0–17.0)
MCH: 26.1 pg (ref 26.0–34.0)
MCHC: 33.4 g/dL (ref 30.0–36.0)
MCV: 78 fL — ABNORMAL LOW (ref 80.0–100.0)
Platelets: 264 10*3/uL (ref 150–400)
RBC: 4.91 MIL/uL (ref 4.22–5.81)
RDW: 15.5 % (ref 11.5–15.5)
WBC: 7.1 10*3/uL (ref 4.0–10.5)
nRBC: 0 % (ref 0.0–0.2)

## 2022-08-14 LAB — COMPREHENSIVE METABOLIC PANEL
ALT: 28 U/L (ref 0–44)
AST: 28 U/L (ref 15–41)
Albumin: 3.8 g/dL (ref 3.5–5.0)
Alkaline Phosphatase: 99 U/L (ref 38–126)
Anion gap: 10 (ref 5–15)
BUN: 25 mg/dL — ABNORMAL HIGH (ref 6–20)
CO2: 21 mmol/L — ABNORMAL LOW (ref 22–32)
Calcium: 9.2 mg/dL (ref 8.9–10.3)
Chloride: 105 mmol/L (ref 98–111)
Creatinine, Ser: 1.35 mg/dL — ABNORMAL HIGH (ref 0.61–1.24)
GFR, Estimated: >60 mL/min (ref >60)
Glucose, Bld: 291 mg/dL — ABNORMAL HIGH (ref 70–99)
Potassium: 4.7 mmol/L (ref 3.5–5.1)
Sodium: 136 mmol/L (ref 135–145)
Total Bilirubin: 0.5 mg/dL (ref 0.3–1.2)
Total Protein: 7 g/dL (ref 6.5–8.1)

## 2022-08-14 LAB — RAPID URINE DRUG SCREEN, HOSP PERFORMED
Amphetamines: NOT DETECTED
Barbiturates: NOT DETECTED
Benzodiazepines: NOT DETECTED
Cocaine: NOT DETECTED
Opiates: NOT DETECTED
Tetrahydrocannabinol: NOT DETECTED

## 2022-08-14 LAB — DIFFERENTIAL
Abs Immature Granulocytes: 0.07 10*3/uL (ref 0.00–0.07)
Basophils Absolute: 0.1 10*3/uL (ref 0.0–0.1)
Basophils Relative: 1 %
Eosinophils Absolute: 0.2 10*3/uL (ref 0.0–0.5)
Eosinophils Relative: 3 %
Immature Granulocytes: 1 %
Lymphocytes Relative: 33 %
Lymphs Abs: 2.4 10*3/uL (ref 0.7–4.0)
Monocytes Absolute: 0.6 10*3/uL (ref 0.1–1.0)
Monocytes Relative: 9 %
Neutro Abs: 3.8 10*3/uL (ref 1.7–7.7)
Neutrophils Relative %: 53 %

## 2022-08-14 LAB — URINALYSIS, ROUTINE W REFLEX MICROSCOPIC
Bacteria, UA: NONE SEEN
Bilirubin Urine: NEGATIVE
Glucose, UA: >=500 mg/dL — AB
Ketones, ur: NEGATIVE mg/dL
Leukocytes,Ua: NEGATIVE
Nitrite: NEGATIVE
Protein, ur: 100 mg/dL — AB
Specific Gravity, Urine: 1.022 (ref 1.005–1.030)
pH: 6 (ref 5.0–8.0)

## 2022-08-14 LAB — PROTIME-INR
INR: 1.2 (ref 0.8–1.2)
Prothrombin Time: 14.7 seconds (ref 11.4–15.2)

## 2022-08-14 LAB — APTT: aPTT: 28 seconds (ref 24–36)

## 2022-08-14 NOTE — ED Notes (Signed)
Patient transported to CT 

## 2022-08-14 NOTE — ED Notes (Signed)
Patient transported to MRI 

## 2022-08-14 NOTE — ED Triage Notes (Signed)
Pt BIB EMS from home for slurred speech, today at 1400. Pt has a previous stroke in November. His home health nurse reports he also had slurred speech. Pt hypertensive at 250/130.   CBG 400

## 2022-08-14 NOTE — ED Notes (Signed)
ED Provider at bedside. 

## 2022-08-14 NOTE — ED Notes (Signed)
Pt returned from MRI/ pt stable

## 2022-08-15 NOTE — ED Notes (Signed)
RN reviewed discharge instructions with pt. Pt verbalized understanding and had no further questions. VSS upon discharge.  

## 2022-08-15 NOTE — Discharge Instructions (Addendum)
You were seen here today for evaluation of your strokelike symptoms and elevated blood pressure.  Your CT imaging and MRI did not show any signs of an acute stroke.  Your lab work is within normal limits. I think this is likely from your elevated blood pressure. I would like for you to follow up with your PCP to be placed on a different regimen to better cover you during the afternoon.  Please follow-up with a neurologist like your exposed to when you were discharged.  If you have any concerns, new or worsening symptoms, please return to the nearest emergency department for evaluation.  Contact a health care provider if you: Think you are having a reaction to a medicine you are taking. Have headaches that keep coming back (recurring). Feel dizzy. Have swelling in your ankles. Have trouble with your vision. Get help right away if you: Develop a severe headache or confusion. Have unusual weakness or numbness. Feel faint. Have severe pain in your chest or abdomen. Vomit repeatedly. Have trouble breathing. These symptoms may be an emergency. Get help right away. Call 911. Do not wait to see if the symptoms will go away. Do not drive yourself to the hospital.

## 2022-08-15 NOTE — ED Provider Notes (Signed)
[facility] EMERGENCY DEPARTMENT Provider Note   CSN: [id number] Arrival date & time: 08/14/22  1820     History Chief Complaint  Patient presents with   Aphasia    Charles Orr is a 61 y.o. male with history of GERD, type 2 diabetes, CVA, hypertension, bipolar, schizoaffective, presents the emergency room today for evaluation of elevated blood pressure and slurred speech.  Last known well was around 1000 today when the patient went to his doctor's office.  Per EMS, patient's dad called him today and said that his speech was a little off.  Patient's nurse that calls him daily also said that his speech sounded slurred and called EMS.  Patient's blood pressure was notably high with EMS.  He reports compliancy to his blood pressure medications.  It is unsure if this slurred speech is newer as the patient was also referred to a speech therapist during his doctor's office appointment today.  He denies any headaches.  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  He has some right-sided weakness at his baseline as well.  He denies any chest pain or shortness of breath.  Denies any palpitations or leg swelling.  Denies any nausea, vomiting, abdominal pain, or recent cough or cold symptoms.  HPI     Home Medications Prior to Admission medications   Medication Sig Start Date End Date Taking? Authorizing Provider  aspirin 81 MG chewable tablet Chew 1 tablet (81 mg total) by mouth daily. 07/13/22  Yes Standley Brooking, MD  atorvastatin (LIPITOR) 20 MG tablet Take 20 mg by mouth at bedtime.   Yes [provider]  cloNIDine (CATAPRES) 0.1 MG tablet Take 0.1 mg by mouth in the morning and at bedtime. 02/04/20  Yes [provider]  clopidogrel (PLAVIX) 75 MG tablet Take 1 tablet (75 mg total) by mouth daily. 07/13/22  Yes Standley Brooking, MD  dorzolamide-timolol (COSOPT) 22.3-6.8 MG/ML ophthalmic solution Place 1 drop into the right eye 2 (two)  times daily.   Yes [provider]  ergocalciferol (VITAMIN D2) 1.25 MG (50000 UT) capsule Take 50,000 Units by mouth every Monday.   Yes [provider]  gabapentin (NEURONTIN) 600 MG tablet Take 600 mg by mouth in the morning, at noon, and at bedtime.   Yes [provider]  hydrOXYzine (VISTARIL) 50 MG capsule Take 100 mg by mouth See admin instructions. Take 100 mg by mouth at bedtime and an additional 100 mg once a day as needed for anxiety 06/01/22  Yes [provider]  Insulin Aspart FlexPen (NOVOLOG) 100 UNIT/ML Inject 16 Units into the skin 3 (three) times daily before meals. 06/09/22  Yes [provider]  insulin glargine (LANTUS) 100 UNIT/ML injection Inject 0.22 mLs (22 Units total) into the skin in the morning. Patient taking differently: Inject 30 Units into the skin 2 (two) times daily. 02/22/21  Yes Antonieta Pert, MD  JANUMET 50-1000 MG tablet Take 1 tablet by mouth in the morning and at bedtime. 07/06/22  Yes [provider]  JARDIANCE 25 MG TABS tablet Take 25 mg by mouth every morning. 07/06/22  Yes [provider]  losartan (COZAAR) 25 MG tablet Take 1 tablet (25 mg total) by mouth daily. 12/31/18 08/15/23 Yes Turner, Cornelious Bryant, MD  nicotine (NICODERM CQ - DOSED IN MG/24 HOURS) 21 mg/24hr patch Place 1 patch (21 mg total) onto the skin daily. 07/13/22  Yes Standley Brooking, MD  traZODone (DESYREL) 100 MG tablet Take 100 mg by mouth at bedtime.   Yes [provider]  esomeprazole (NEXIUM) 40 MG capsule Take 1 capsule (40 mg total) by mouth at bedtime. Hold until done taking Plavix, then resume Nexium. Patient not taking: Reported on 08/14/2022 07/13/22   Standley Brooking, MD  risperiDONE (RISPERDAL) 1 MG tablet Take 1 tablet (1 mg) by mouth in the mornings & 2 tablets (2 mg) at bedtime: For mood control Patient not taking: Reported on 08/14/2022 02/21/21   Antonieta Pert, MD  sertraline (ZOLOFT) 100 MG tablet  Take 1 tablet (100 mg total) by mouth daily. Patient not taking: Reported on 08/14/2022 02/22/21   Antonieta Pert, MD      Allergies    Patient has no known allergies.    Review of Systems   Review of Systems  Constitutional:  Negative for chills and fever.  Respiratory:  Negative for cough and shortness of breath.   Cardiovascular:  Negative for chest pain, palpitations and leg swelling.  Gastrointestinal:  Negative for abdominal pain, nausea and vomiting.  Neurological:  Negative for syncope and headaches.    Physical Exam Updated Vital Signs BP (!) 166/89 (BP Location: Left Arm)   Pulse 78   Temp (!) 97.5 F (36.4 C)   Resp 16   Ht 6\' 2"  (1.88 m)   Wt 122.5 kg   SpO2 97%   BMI 34.67 kg/m  Physical Exam Vitals and nursing note reviewed.  Constitutional:      General: He is not in acute distress.    Appearance: Normal appearance. He is not ill-appearing or toxic-appearing.  HENT:     Head: Normocephalic and atraumatic.     Mouth/Throat:     Mouth: Mucous membranes are moist.     Comments: Poor dentition noted throughout Eyes:     General: No scleral icterus.    Extraocular Movements: Extraocular movements intact.     Pupils: Pupils are equal, round, and reactive to light.     Comments: Question polycoria in the left eye  Cardiovascular:     Rate and Rhythm: Normal rate.  Pulmonary:     Effort: Pulmonary effort is normal. No respiratory distress.  Abdominal:     General: Bowel sounds are normal.     Palpations: Abdomen is soft.     Tenderness: There is no abdominal tenderness. There is no guarding or rebound.  Musculoskeletal:        General: No deformity.     Cervical back: Normal range of motion. No rigidity.  Skin:    General: Skin is warm and dry.  Neurological:     General: No focal deficit present.     Mental Status: He is alert. Mental status is at baseline.     GCS: GCS eye subscore is 4. GCS verbal subscore is 5. GCS motor subscore is 6.      Cranial Nerves: No cranial nerve deficit, dysarthria or facial asymmetry.     Comments: Patient is extremely hard of hearing.  Cranial nerves II through XII intact.  He is answering questions appropriately with appropriate speech.  No facial asymmetry noted.  He reports that the right side of his body does feel slightly different although this is his baseline since his last stroke.  He does not have any pronator drift.  He has slight decrease in strength in his right upper extremity however I appreciate symmetric strength in his bilateral lower extremities.  He has difficulty  doing the finger-nose with his left arm more than his right arm, suspicion this is mainly visual because of the blindness in his left eye.     ED Results / Procedures / Treatments   Labs (all labs ordered are listed, but only abnormal results are displayed) Labs Reviewed  CBC - Abnormal; Notable for the following components:      Result Value   Hemoglobin 12.8 (*)    HCT 38.3 (*)    MCV 78.0 (*)    All other components within normal limits  COMPREHENSIVE METABOLIC PANEL - Abnormal; Notable for the following components:   CO2 21 (*)    Glucose, Bld 291 (*)    BUN 25 (*)    Creatinine, Ser 1.35 (*)    All other components within normal limits  URINALYSIS, ROUTINE W REFLEX MICROSCOPIC - Abnormal; Notable for the following components:   Color, Urine STRAW (*)    Glucose, UA >=500 (*)    Hgb urine dipstick SMALL (*)    Protein, ur 100 (*)    All other components within normal limits  PROTIME-INR  APTT  DIFFERENTIAL  RAPID URINE DRUG SCREEN, HOSP PERFORMED  ETHANOL  I-STAT CHEM 8, ED    EKG EKG Interpretation  Date/Time:  Monday August 14 2022 18:46:32 EST Ventricular Rate:  80 PR Interval:  176 QRS Duration: 82 QT Interval:  377 QTC Calculation: 435 R Axis:   53 Text Interpretation: Sinus rhythm Minimal ST depression, inferior leads Minimal ST elevation, anterior leads No significant change since last  tracing Confirmed by Benjiman Core [phone]) on 08/15/2022 12:06:34 AM  Radiology MR BRAIN WO CONTRAST  Result Date: 08/14/2022 CLINICAL DATA:  Transient ischemic attack EXAM: MRI HEAD WITHOUT CONTRAST TECHNIQUE: Multiplanar, multiecho pulse sequences of the brain and surrounding structures were obtained without intravenous contrast. COMPARISON:  None Available. FINDINGS: Brain: No acute infarct, mass effect or extra-axial collection. No acute or chronic hemorrhage. There is multifocal hyperintense T2-weighted signal within the white matter. Parenchymal volume and CSF spaces are normal. The midline structures are normal. Vascular: Major flow voids are preserved. Skull and upper cervical spine: Normal calvarium and skull base. Visualized upper cervical spine and soft tissues are normal. Sinuses/Orbits:No paranasal sinus fluid levels or advanced mucosal thickening. No mastoid or middle ear effusion. Normal orbits. IMPRESSION: 1. No acute intracranial abnormality. 2. Multifocal hyperintense T2-weighted signal within the white matter, nonspecific but most commonly seen in the setting of chronic small vessel ischemia. Electronically Signed   By: Deatra Robinson M.D.   On: 08/14/2022 23:33   CT Head Wo Contrast  Result Date: 08/14/2022 CLINICAL DATA:  TIA EXAM: CT HEAD WITHOUT CONTRAST TECHNIQUE: Contiguous axial images were obtained from the base of the skull through the vertex without intravenous contrast. RADIATION DOSE REDUCTION: This exam was performed according to the departmental dose-optimization program which includes automated exposure control, adjustment of the mA and/or kV according to patient size and/or use of iterative reconstruction technique. COMPARISON:  MRI brain 07/27/2022.  CT a head 07/27/2022. FINDINGS: Brain: No evidence of acute infarction, hemorrhage, hydrocephalus, extra-axial collection or mass lesion/mass effect. Vascular: No hyperdense vessel or unexpected calcification. Skull:  Normal. Negative for fracture or focal lesion. Sinuses/Orbits: No air-fluid levels are seen in the sinuses. Hyperdensity within the left lobe is unchanged. Other: None. IMPRESSION: No acute intracranial process. Electronically Signed   By: Darliss Cheney M.D.   On: 08/14/2022 21:42    Procedures Procedures   Medications Ordered in ED Medications -  No data to display  ED Course/ Medical Decision Making/ A&P                           Medical Decision Making Amount and/or Complexity of Data Reviewed Labs: ordered. Radiology: ordered.    61 year old male presents the emergency room today for evaluation of possible slurred speech and elevated blood pressure.  Differential diagnosis includes limited to hypertensive urgency, hypertensive emergency, stroke, TIA.  Vital signs initially showed blood pressure at 206/86 although now has been 157/87.  Afebrile, normal pulse rate, satting well on room air without increased work of breathing.  Physical exam as noted above.  Given the patient's recent history of a stroke and his presumed slurred speech, we are out of any stroke window so no code stroke need to be done however will still do a CT of the head and consult neuro.  I independently reviewed and interpreted the patient's labs.  CMP shows elevated glucose at 291.  Patient's creatinine 1.35 with elevated BUN although appears to be improving patient previous.  Bicarb slightly decreased at 21.  Otherwise no electrolyte or LFT abnormalities.  Urine shows straw-colored urine with greater than 500 glucose.  There is small amount of hemoglobin 100 protein otherwise no signs of infection.  CBC shows slight anemia with a hemoglobin of 12.8 although appears to be around patient's baseline.  No leukocytosis.  Normal PT INR and APTT.  UDS is negative.  CT imaging shows no acute intracranial abnormality.  Spoke with Dr. Jerrell BelfastAurora with neurology.  Recommends MRI, however given his CT imaging it does show some  chronic cerebellar/brainstem changes likely due to his chronic hypertension.  MRI shows  1. No acute intracranial abnormality. 2. Multifocal hyperintense T2-weighted signal within the white matter, nonspecific but most commonly seen in the setting of chronic small vessel ischemia.  The patient felt fine on arrival.  He still is in no acute distress and has no complaints.  He reports that his blood pressure is usually high in the afternoons and thinks that it may have been higher because he a lot of barbecue sauce with his chicken today.  I do not appreciate any new deficits.  He is in no acute distress and does not have any complaints.  The patient's high blood pressure could have brought on the symptoms of his previous stroke.  He did have some elevated blood pressure initially, however has been only slightly hypertensive here.  Patient is cleared from a neuro perspective and can follow-up with outpatient neuro.  Patient reports he is scheduled appointment in January.  I advised him to keep this.  We went over the lab and imaging results.  We discussed return precautions and red flag symptoms.  Patient verbalizes understanding and agrees to the plan.  Patient is stable being discharged home in good condition.  I discussed this case with my attending physician who cosigned this note including patient's presenting symptoms, physical exam, and planned diagnostics and interventions. Attending physician stated agreement with plan or made changes to plan which were implemented.    Final Clinical Impression(s) / ED Diagnoses Final diagnoses:  Elevated blood pressure reading in office with diagnosis of hypertension  Slurred speech    Rx / DC Orders ED Discharge Orders     None         Achille RichRansom, Rosalena Mccorry, Cordelia Poche-C 08/16/22 2228    Benjiman CorePickering, Nathan, MD 08/21/22 1451

## 2022-09-26 ENCOUNTER — Ambulatory Visit: Payer: Medicaid Other | Admitting: Neurology

## 2022-09-26 ENCOUNTER — Encounter: Payer: Self-pay | Admitting: Neurology

## 2022-09-26 VITALS — BP 136/91 | HR 76 | Ht 74.0 in | Wt 281.5 lb

## 2022-09-26 DIAGNOSIS — E0842 Diabetes mellitus due to underlying condition with diabetic polyneuropathy: Secondary | ICD-10-CM

## 2022-09-26 DIAGNOSIS — I639 Cerebral infarction, unspecified: Secondary | ICD-10-CM | POA: Diagnosis not present

## 2022-09-26 DIAGNOSIS — R2681 Unsteadiness on feet: Secondary | ICD-10-CM | POA: Diagnosis not present

## 2022-09-26 NOTE — Patient Instructions (Signed)
I had a long d/w patient about his recent silent lacunar stroke, chronic gait difficulty from diabetic neuropathy,risk for recurrent stroke/TIAs, personally independently reviewed imaging studies and stroke evaluation results and answered questions.Continue aspirin 81 mg daily  alone and stop Plavix now for secondary stroke prevention and maintain strict control of hypertension with blood pressure goal below 130/90, diabetes with hemoglobin A1c goal below 6.5% and lipids with LDL cholesterol goal below 70 mg/dL. I also advised the patient to eat a healthy diet with plenty of whole grains, cereals, fruits and vegetables, exercise regularly and maintain ideal body weight I have complemented patient on quitting smoking as well as cocaine encouraged him to continue to soak.  He was advised to use cane while walking outdoors and he talked about fall safety precautions.  Followup in the future with my nurse practitioner in 6 months or call earlier if necessary.  Stroke Prevention Some medical conditions and behaviors can lead to a higher chance of having a stroke. You can help prevent a stroke by eating healthy, exercising, not smoking, and managing any medical conditions you have. Stroke is a leading cause of functional impairment. Primary prevention is particularly important because a majority of strokes are first-time events. Stroke changes the lives of not only those who experience a stroke but also their family and other caregivers. How can this condition affect me? A stroke is a medical emergency and should be treated right away. A stroke can lead to brain damage and can sometimes be life-threatening. If a person gets medical treatment right away, there is a better chance of surviving and recovering from a stroke. What can increase my risk? The following medical conditions may increase your risk of a stroke: Cardiovascular disease. High blood pressure (hypertension). Diabetes. High cholesterol. Sickle  cell disease. Blood clotting disorders (hypercoagulable state). Obesity. Sleep disorders (obstructive sleep apnea). Other risk factors include: Being older than age 32. Having a history of blood clots, stroke, or mini-stroke (transient ischemic attack, TIA). Genetic factors, such as race, ethnicity, or a family history of stroke. Smoking cigarettes or using other tobacco products. Taking birth control pills, especially if you also use tobacco. Heavy use of alcohol or drugs, especially cocaine and methamphetamine. Physical inactivity. What actions can I take to prevent this? Manage your health conditions High cholesterol levels. Eating a healthy diet is important for preventing high cholesterol. If cholesterol cannot be managed through diet alone, you may need to take medicines. Take any prescribed medicines to control your cholesterol as told by your health care provider. Hypertension. To reduce your risk of stroke, try to keep your blood pressure below 130/80. Eating a healthy diet and exercising regularly are important for controlling blood pressure. If these steps are not enough to manage your blood pressure, you may need to take medicines. Take any prescribed medicines to control hypertension as told by your health care provider. Ask your health care provider if you should monitor your blood pressure at home. Have your blood pressure checked every year, even if your blood pressure is normal. Blood pressure increases with age and some medical conditions. Diabetes. Eating a healthy diet and exercising regularly are important parts of managing your blood sugar (glucose). If your blood sugar cannot be managed through diet and exercise, you may need to take medicines. Take any prescribed medicines to control your diabetes as told by your health care provider. Get evaluated for obstructive sleep apnea. Talk to your health care provider about getting a sleep evaluation if you  snore a lot or  have excessive sleepiness. Make sure that any other medical conditions you have, such as atrial fibrillation or atherosclerosis, are managed. Nutrition Follow instructions from your health care provider about what to eat or drink to help manage your health condition. These instructions may include: Reducing your daily calorie intake. Limiting how much salt (sodium) you use to 1,500 milligrams (mg) each day. Using only healthy fats for cooking, such as olive oil, canola oil, or sunflower oil. Eating healthy foods. You can do this by: Choosing foods that are high in fiber, such as whole grains, and fresh fruits and vegetables. Eating at least 5 servings of fruits and vegetables a day. Try to fill one-half of your plate with fruits and vegetables at each meal. Choosing lean protein foods, such as lean cuts of meat, poultry without skin, fish, tofu, beans, and nuts. Eating low-fat dairy products. Avoiding foods that are high in sodium. This can help lower blood pressure. Avoiding foods that have saturated fat, trans fat, and cholesterol. This can help prevent high cholesterol. Avoiding processed and prepared foods. Counting your daily carbohydrate intake.  Lifestyle If you drink alcohol: Limit how much you have to: 0-1 drink a day for women who are not pregnant. 0-2 drinks a day for men. Know how much alcohol is in your drink. In the [place] one drink equals one 12 oz bottle of beer (330mL), one 5 oz glass of wine (138mL), or one 1 oz glass of hard liquor (24mL). Do not use any products that contain nicotine or tobacco. These products include cigarettes, chewing tobacco, and vaping devices, such as e-cigarettes. If you need help quitting, ask your health care provider. Avoid secondhand smoke. Do not use drugs. Activity  Try to stay at a healthy weight. Get at least 30 minutes of exercise on most days, such as: Fast walking. Biking. Swimming. Medicines Take over-the-counter and  prescription medicines only as told by your health care provider. Aspirin or blood thinners (antiplatelets or anticoagulants) may be recommended to reduce your risk of forming blood clots that can lead to stroke. Avoid taking birth control pills. Talk to your health care provider about the risks of taking birth control pills if: You are over 40 years old. You smoke. You get very bad headaches. You have had a blood clot. Where to find more information American Stroke Association: www.strokeassociation.org Get help right away if: You or a loved one has any symptoms of a stroke. "BE FAST" is an easy way to remember the main warning signs of a stroke: B - Balance. Signs are dizziness, sudden trouble walking, or loss of balance. E - Eyes. Signs are trouble seeing or a sudden change in vision. F - Face. Signs are sudden weakness or numbness of the face, or the face or eyelid drooping on one side. A - Arms. Signs are weakness or numbness in an arm. This happens suddenly and usually on one side of the body. S - Speech. Signs are sudden trouble speaking, slurred speech, or trouble understanding what people say. T - Time. Time to call emergency services. Write down what time symptoms started. You or a loved one has other signs of a stroke, such as: A sudden, severe headache with no known cause. Nausea or vomiting. Seizure. These symptoms may represent a serious problem that is an emergency. Do not wait to see if the symptoms will go away. Get medical help right away. Call your local emergency services (911 in the [place] Do not  drive yourself to the hospital. Summary You can help to prevent a stroke by eating healthy, exercising, not smoking, limiting alcohol intake, and managing any medical conditions you may have. Do not use any products that contain nicotine or tobacco. These include cigarettes, chewing tobacco, and vaping devices, such as e-cigarettes. If you need help quitting, ask your health care  provider. Remember "BE FAST" for warning signs of a stroke. Get help right away if you or a loved one has any of these signs. This information is not intended to replace advice given to you by your health care provider. Make sure you discuss any questions you have with your health care provider. Document Revised: 03/22/2020 Document Reviewed: 03/22/2020 Elsevier Patient Education  [place].

## 2022-09-26 NOTE — Progress Notes (Signed)
[facility] [address] [place]. [place] [address] [phone]       OFFICE FOLLOW-UP NOTE  [name] Date of Birth:  08/09/1961 Medical Record Number:  [id number]   HPI: Charles Orr is a 62 year old African-American male seen today for initial office follow-up visit for stroke.  History is obtained from the patient and review of electronic medical records.  I have personally reviewed pertinent available imaging films in PACS. He has past medical history of diabetes, hypertension, hyperlipidemia, schizophrenia, diabetic retinopathy with bilateral visual impairment, cocaine abuse, gastroesophageal reflux disease and coronary artery disease.  Patient was seen by neurology on 07/11/2022 as needed for uncontrolled hypertension and dizziness has been going on for several weeks.  MRI scan showed a acute to subacute small left-sided corpus callosum and adjacent frontal white matter lacunar infarct which was felt to be clinically asymptomatic.  His dizziness and gait imbalance are felt to be combination of his underlying chronic diabetic neuropathy as well as poor vision CT angiogram showed moderate left P2 stenosis as well as P2/P3 stenosis echocardiogram showed ejection fraction of 50.  LDL cholesterol was optimal at 53 mg percent but hemoglobin A1c was elevated at 8.9.  He was advised to be on aspirin and Plavix for 3 weeks followed by aspirin alone.  He returned to the emergency room on 07/27/2022 with new complaints of trouble seeing out of his right eye tingling.  Repeat MRI brain showed no new infarct or acute findings.  He states he still on aspirin and Plavix and tolerating it well without bruising or bleeding.  His blood pressure is better controlled and today it is 136/91.  His fasting sugars.  Doing better in the 80s to 90.  Continues to have poor vision in both eyes remains dizzy and off balance.  Patient is Tingling and numbness in his feet and takes gabapentin 600 twice  daily helpful.  He uses a cane to ambulate and states he has had no falls or injuries  even though his vision is quite limited.  He has quit smoking completely is currently on a nicotine patch.  He is also not to use cocaine.  No new complaints today. 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 ([place]) 10/06/2017   Community acquired pneumonia of right lower lobe of lung    Degenerative disc disease, lumbar    Depression    DM (diabetes mellitus) ([place])    Fibromyalgia    GERD (gastroesophageal reflux disease)    High cholesterol    Hyperglycemia    Hypertension    Localized edema    Myocardial infarction ([place]) 2002   Osteoarthritis    Other schizophrenia ([place])    Pneumonia 1989   Polysubstance (including opioids) dependence with physiological dependence ([place]) 10/06/2017   Schizoaffective disorder, bipolar type ([place]) 06/16/2017   Schizophrenia ([place])    Shortness of breath 06/10/2017   Tobacco abuse 07/09/2017   Type II diabetes mellitus ([place])     Social History:  Social History  Socioeconomic History   Marital status: Divorced    Spouse name: Not on file   Number of children: 1   Years of education: Not on file   Highest education level: GED or equivalent  Occupational History   Not on file  Tobacco Use   Smoking status: Every Day    Packs/day: 0.50    Years: 38.00    Total pack years: 19.00    Types: Cigarettes   Smokeless tobacco: Current    Types: Chew  Vaping Use   Vaping Use: Never used   Substance and Sexual Activity   Alcohol use: Yes    Comment: 06/12/2017 "stopped ~ 1 yr ago"    Drug use: Yes    Types: Cocaine    Comment: 06/12/2017 "stopped using cocaine ~ 1 yr ago; used marijuana when I was younger" last cocaine use 9 mths ago per pt on 06/15/17   Sexual activity: Not Currently  Other Topics Concern   Not on file  Social History Narrative   Not on file   Social Determinants of Health   Financial Resource Strain: Not on file  Food Insecurity: Not on file  Transportation Needs: Not on file  Physical Activity: Not on file  Stress: Not on file  Social Connections: Not on file  Intimate Partner Violence: Not on file    Medications:   Current Outpatient Medications on File Prior to Visit  Medication Sig Dispense Refill   aspirin 81 MG chewable tablet Chew 1 tablet (81 mg total) by mouth daily.     dorzolamide-timolol (COSOPT) 22.3-6.8 MG/ML ophthalmic solution Place 1 drop into the right eye 2 (two) times daily.     Empagliflozin (JARDIANCE PO) Take 25 mg by mouth every morning. 45 mg BID     ergocalciferol (VITAMIN D2) 1.25 MG (50000 UT) capsule Take 50,000 Units by mouth every Monday.     gabapentin (NEURONTIN) 600 MG tablet Take 600 mg by mouth in the morning, at noon, and at bedtime.     hydrOXYzine (VISTARIL) 50 MG capsule Take 100 mg by mouth See admin instructions. Take 100 mg by mouth at bedtime and an additional 100 mg once a day as needed for anxiety     Insulin Aspart FlexPen (NOVOLOG) 100 UNIT/ML Inject 16 Units into the skin 3 (three) times daily before meals.     insulin glargine (LANTUS) 100 UNIT/ML injection Inject 0.22 mLs (22 Units total) into the skin in the morning. (Patient taking differently: Inject 30 Units into the skin 2 (two) times daily.) 10 mL 0   losartan (COZAAR) 25 MG tablet Take 1 tablet (25 mg total) by mouth daily. 90 tablet 3   nicotine (NICODERM CQ - DOSED IN MG/24 HOURS) 21 mg/24hr patch Place 1 patch (21 mg total) onto the skin  daily. 28 patch 2   atorvastatin (LIPITOR) 20 MG tablet Take 20 mg by mouth at bedtime.     JANUMET 50-1000 MG tablet Take 1 tablet by mouth in the morning and at bedtime.     No current facility-administered medications on file prior to visit.    Allergies:  No Known Allergies  Physical Exam General: Obese middle-aged African-American male seated, in no evident distress Head: head normocephalic and atraumatic.  Neck: supple with no carotid or supraclavicular bruits Cardiovascular: regular rate and rhythm, no murmurs Musculoskeletal: no deformity Skin:  no rash/petichiae Vascular:  Normal pulses all extremities Vitals:   09/26/22 0801 09/26/22 0807  BP: (!) 160/101 (!) 136/91  Pulse: 77 76   Neurologic Exam Mental Status: Awake and fully alert. Oriented to place and time. Recent and remote memory intact. Attention span, concentration and fund of knowledge appropriate. Mood and affect appropriate.  Cranial Nerves: Fundoscopic exam reveals sharp disc margins. Pupils irregular and sluggishl yreactive to light. Extraocular movements full without nystagmus. Visual fields full restricted bilaterally to poor baseline vision of finger counting at 2 feet bilaterally.Marland Kitchen Hearing intact. Facial sensation intact. Face, tongue, palate moves normally and symmetrically.  Motor: Normal bulk and tone. Normal strength in all tested extremity muscles. Sensory.:  Diminished touch ,pinprick .position and vibratory sensation from ankle down bilaterally..  Coordination: Rapid alternating movements normal in all extremities. Finger-to-nose and heel-to-shin performed accurately bilaterally. Gait and Station: Arises from chair without difficulty. Stance is wide-based. Gait demonstrates mild ataxia with broad-based.  Unable to stand on narrow-based unsupported  reflexes: 1+ and symmetric except ankle jerks are depressed. Toes downgoing.   NIHSS  2 Modified Rankin  1   ASSESSMENT: 62 year old African-American  male with silent left frontal white matter and corpus callosum lacunar infarct from small vessel disease related to cocaine and nicotine abuse.,  Uncontrolled hypertension, diabetes, hyperlipidemia, obesity and intracranial atherosclerosis     PLAN:I had a long d/w patient about his recent silent lacunar stroke, chronic gait difficulty from diabetic neuropathy,risk for recurrent stroke/TIAs, personally independently reviewed imaging studies and stroke evaluation results and answered questions.Continue aspirin 81 mg daily  alone and stop Plavix now for secondary stroke prevention and maintain strict control of hypertension with blood pressure goal below 130/90, diabetes with hemoglobin A1c goal below 6.5% and lipids with LDL cholesterol goal below 70 mg/dL. I also advised the patient to eat a healthy diet with plenty of whole grains, cereals, fruits and vegetables, exercise regularly and maintain ideal body weight I have complemented patient on quitting smoking as well as cocaine encouraged him to continue to soak.  He was advised to use cane while walking outdoors and he talked about fall safety precautions.  Followup in the future with my nurse practitioner in 6 months or call earlier if necessary.Greater than 50% of time during this 35 minute visit was spent on counseling,explanation of diagnosis, planning of further management, discussion with patient and family and coordination of care Delia Heady, MD Note: This document was prepared with digital dictation and possible smart phrase technology. Any transcriptional errors that result from this process are unintentional

## 2023-01-31 ENCOUNTER — Encounter: Payer: Self-pay | Admitting: Podiatry

## 2023-01-31 ENCOUNTER — Ambulatory Visit (INDEPENDENT_AMBULATORY_CARE_PROVIDER_SITE_OTHER): Payer: Medicaid Other | Admitting: Podiatry

## 2023-01-31 DIAGNOSIS — E1169 Type 2 diabetes mellitus with other specified complication: Secondary | ICD-10-CM | POA: Diagnosis not present

## 2023-01-31 DIAGNOSIS — B351 Tinea unguium: Secondary | ICD-10-CM

## 2023-01-31 DIAGNOSIS — M79674 Pain in right toe(s): Secondary | ICD-10-CM | POA: Diagnosis not present

## 2023-01-31 DIAGNOSIS — Z794 Long term (current) use of insulin: Secondary | ICD-10-CM | POA: Diagnosis not present

## 2023-01-31 DIAGNOSIS — M79675 Pain in left toe(s): Secondary | ICD-10-CM

## 2023-01-31 NOTE — Progress Notes (Signed)
This patient presents to the office with chief complaint of long thick nails and diabetic feet.  This patient  says there  is  no pain and discomfort in his feet.  This patient says there are long thick painful nails.  These nails are painful walking and wearing shoes.  Patient has no history of infection or drainage from both feet.  Patient is unable to  self treat his own nails . This patient presents  to the office today for treatment of the  long nails and a foot evaluation due to history of  diabetes.  General Appearance  Alert, conversant and in no acute stress.  Vascular  Dorsalis pedis and posterior tibial  pulses are  not palpable  bilaterally.  Capillary return is within normal limits  bilaterally. Temperature is within normal limits  bilaterally.  Neurologic  Senn-Weinstein monofilament wire test absent  bilaterally. Muscle power within normal limits bilaterally.  Nails Thick disfigured discolored nails with subungual debris  from hallux to fifth toes bilaterally. No evidence of bacterial infection or drainage bilaterally.  Orthopedic  No limitations of motion of motion feet .  No crepitus or effusions noted.  No bony pathology or digital deformities noted.  Skin  normotropic skin with no porokeratosis noted bilaterally.  No signs of infections or ulcers noted.     Onychomycosis  Diabetes   IE  Debride nails x 10.  A diabetic foot exam was performed and there is evidence of both vascular or neurologic pathology.   RTC 3 months.   Helane Gunther DPM

## 2023-04-04 NOTE — Progress Notes (Deleted)
No chief complaint on file.   HISTORY OF PRESENT ILLNESS:  04/04/23 ALL:  Charles Orr is a 62 y.o. male here today for follow up for  history of CVA 07/11/2022. He was last seen by Dr Pearlean Brownie 09/2022 and doing fairly well.   He continues asa 81mg  and atorvastatin 20mg  daily.  CBGs? He continues Kenefic, Janumet? Novolog and Lantus.  BP is *** on losartan.    HISTORY (copied from Dr Marlis Edelson previous note)  HPI: Charles Orr is a 62 year old African-American male seen today for initial office follow-up visit for stroke.  History is obtained from the patient and review of electronic medical records.  I have personally reviewed pertinent available imaging films in PACS. He has past medical history of diabetes, hypertension, hyperlipidemia, schizophrenia, diabetic retinopathy with bilateral visual impairment, cocaine abuse, gastroesophageal reflux disease and coronary artery disease.  Patient was seen by neurology on 07/11/2022 as needed for uncontrolled hypertension and dizziness has been going on for several weeks.  MRI scan showed a acute to subacute small left-sided corpus callosum and adjacent frontal white matter lacunar infarct which was felt to be clinically asymptomatic.  His dizziness and gait imbalance are felt to be combination of his underlying chronic diabetic neuropathy as well as poor vision CT angiogram showed moderate left P2 stenosis as well as P2/P3 stenosis echocardiogram showed ejection fraction of 50.  LDL cholesterol was optimal at 53 mg percent but hemoglobin A1c was elevated at 8.9.  He was advised to be on aspirin and Plavix for 3 weeks followed by aspirin alone.  He returned to the emergency room on 07/27/2022 with new complaints of trouble seeing out of his right eye tingling.  Repeat MRI brain showed no new infarct or acute findings.  He states he still on aspirin and Plavix and tolerating it well without bruising or bleeding.  His blood pressure is better controlled and today  it is 136/91.  His fasting sugars.  Doing better in the 80s to 90.  Continues to have poor vision in both eyes remains dizzy and off balance.  Patient is Tingling and numbness in his feet and takes gabapentin 600 twice daily helpful.  He uses a cane to ambulate and states he has had no falls or injuries  even though his vision is quite limited.  He has quit smoking completely is currently on a nicotine patch.  He is also not to use cocaine.  No new complaints today.   REVIEW OF SYSTEMS: Out of a complete 14 system review of symptoms, the patient complains only of the following symptoms, numbness, tingling in feet, and all other reviewed systems are negative.   ALLERGIES: No Known Allergies   HOME MEDICATIONS: Outpatient Medications Prior to Visit  Medication Sig Dispense Refill   aspirin 81 MG chewable tablet Chew 1 tablet (81 mg total) by mouth daily.     atorvastatin (LIPITOR) 20 MG tablet Take 20 mg by mouth at bedtime.     dorzolamide-timolol (COSOPT) 22.3-6.8 MG/ML ophthalmic solution Place 1 drop into the right eye 2 (two) times daily.     Empagliflozin (JARDIANCE PO) Take 25 mg by mouth every morning. 45 mg BID     ergocalciferol (VITAMIN D2) 1.25 MG (50000 UT) capsule Take 50,000 Units by mouth every Monday.     gabapentin (NEURONTIN) 600 MG tablet Take 600 mg by mouth in the morning, at noon, and at bedtime.     hydrOXYzine (VISTARIL) 50 MG capsule Take 100 mg  by mouth See admin instructions. Take 100 mg by mouth at bedtime and an additional 100 mg once a day as needed for anxiety     Insulin Aspart FlexPen (NOVOLOG) 100 UNIT/ML Inject 16 Units into the skin 3 (three) times daily before meals.     insulin glargine (LANTUS) 100 UNIT/ML injection Inject 0.22 mLs (22 Units total) into the skin in the morning. (Patient taking differently: Inject 30 Units into the skin 2 (two) times daily.) 10 mL 0   JANUMET 50-1000 MG tablet Take 1 tablet by mouth in the morning and at bedtime.      losartan (COZAAR) 25 MG tablet Take 1 tablet (25 mg total) by mouth daily. 90 tablet 3   nicotine (NICODERM CQ - DOSED IN MG/24 HOURS) 21 mg/24hr patch Place 1 patch (21 mg total) onto the skin daily. 28 patch 2   No facility-administered medications prior to visit.     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   Community acquired pneumonia of right lower lobe of lung    Degenerative disc disease, lumbar    Depression    DM (diabetes mellitus) ([facility])    Fibromyalgia    GERD (gastroesophageal reflux disease)    High cholesterol    Hyperglycemia    Hypertension    Localized edema    Myocardial infarction ([facility]) 2002   Osteoarthritis    Other schizophrenia ([facility])    Pneumonia 1989   Polysubstance (including opioids) dependence with physiological dependence ([facility]) 10/06/2017   Schizoaffective disorder, bipolar type ([facility]) 06/16/2017   Schizophrenia ([facility])    Shortness of breath 06/10/2017   Tobacco abuse 07/09/2017   Type II diabetes mellitus ([facility])      PAST SURGICAL HISTORY: Past Surgical History:  Procedure Laterality Date   CARDIAC CATHETERIZATION  12/2006   [name] 01/18/2011   INGUINAL HERNIA REPAIR Right    LEFT HEART CATH AND CORONARY ANGIOGRAPHY N/A 07/11/2017   Procedure: LEFT HEART CATH AND CORONARY ANGIOGRAPHY;  Surgeon: Tonny Bollman, MD;  Location: [facility] INVASIVE CV LAB;  Service: Cardiovascular;  Laterality: N/A;   PARATHYROIDECTOMY     PERICARDIAL TAP  2000   ?; in [place]/notes 01/18/2011     FAMILY HISTORY: Family History  Problem Relation Age of Onset   Hypertension Mother    Diabetes  Mother    Heart disease Father    Hypertension Sister    Diabetes Sister    Hypertension Brother    Diabetes Brother    Hypertension Sister    Diabetes Sister    Hypertension Brother    Diabetes Brother      SOCIAL HISTORY: Social History   Socioeconomic History   Marital status: Divorced    Spouse name: Not on file   Number of children: 1   Years of education: Not on file   Highest education level: GED or equivalent  Occupational History   Not on file  Tobacco Use   Smoking status: Every Day    Current packs/day: 0.50    Average packs/day: 0.5 packs/day for 38.0 years (19.0 ttl pk-yrs)    Types: Cigarettes   Smokeless  tobacco: Current    Types: Chew  Vaping Use   Vaping status: Never Used  Substance and Sexual Activity   Alcohol use: Yes    Comment: 06/12/2017 "stopped ~ 1 yr ago"    Drug use: Yes    Types: Cocaine    Comment: 06/12/2017 "stopped using cocaine ~ 1 yr ago; used marijuana when I was younger" last cocaine use 9 mths ago per pt on 06/15/17   Sexual activity: Not Currently  Other Topics Concern   Not on file  Social History Narrative   Not on file   Social Determinants of Health   Financial Resource Strain: Not on file  Food Insecurity: Not on file  Transportation Needs: Not on file  Physical Activity: Not on file  Stress: Not on file  Social Connections: Not on file  Intimate Partner Violence: Not on file     PHYSICAL EXAM  There were no vitals filed for this visit. There is no height or weight on file to calculate BMI.  Generalized: Well developed, in no acute distress  Cardiology: normal rate and rhythm, no murmur auscultated  Respiratory: clear to auscultation bilaterally    Neurological examination  Mentation: Alert oriented to time, place, history taking. Follows all commands speech and language fluent Cranial nerve II-XII: Pupils were equal round reactive to light. Extraocular movements were full, visual field were full on  confrontational test. Facial sensation and strength were normal. Uvula tongue midline. Head turning and shoulder shrug  were normal and symmetric. Motor: The motor testing reveals 5 over 5 strength of all 4 extremities. Good symmetric motor tone is noted throughout.  Sensory: Sensory testing is intact to soft touch on all 4 extremities. No evidence of extinction is noted.  Coordination: Cerebellar testing reveals good finger-nose-finger and heel-to-shin bilaterally.  Gait and station: Gait is normal. Tandem gait is normal. Romberg is negative. No drift is seen.  Reflexes: Deep tendon reflexes are symmetric and normal bilaterally.    DIAGNOSTIC DATA (LABS, IMAGING, TESTING) - I reviewed patient records, labs, notes, testing and imaging myself where available.  Lab Results  Component Value Date   WBC 7.1 08/14/2022   HGB 12.8 (L) 08/14/2022   HCT 38.3 (L) 08/14/2022   MCV 78.0 (L) 08/14/2022   PLT 264 08/14/2022      Component Value Date/Time   NA 136 08/14/2022 1832   K 4.7 08/14/2022 1832   CL 105 08/14/2022 1832   CO2 21 (L) 08/14/2022 1832   GLUCOSE 291 (H) 08/14/2022 1832   BUN 25 (H) 08/14/2022 1832   CREATININE 1.35 (H) 08/14/2022 1832   CALCIUM 9.2 08/14/2022 1832   PROT 7.0 08/14/2022 1832   ALBUMIN 3.8 08/14/2022 1832   AST 28 08/14/2022 1832   ALT 28 08/14/2022 1832   ALKPHOS 99 08/14/2022 1832   BILITOT 0.5 08/14/2022 1832   GFRNONAA >60 08/14/2022 1832   GFRAA >60 02/06/2020 1012   Lab Results  Component Value Date   CHOL 100 07/11/2022   HDL 32 (L) 07/11/2022   LDLCALC 53 07/11/2022   TRIG 76 07/11/2022   CHOLHDL 3.1 07/11/2022   Lab Results  Component Value Date   HGBA1C 8.9 (H) 07/11/2022   Lab Results  Component Value Date   VITAMINB12 420 07/11/2022   Lab Results  Component Value Date   TSH 1.810 07/12/2022        No data to display  No data to display           ASSESSMENT AND PLAN  62 y.o. 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) (10/06/2017), Community acquired pneumonia of right lower lobe of lung, Degenerative disc disease, lumbar, Depression, DM (diabetes mellitus) ([facility]), Fibromyalgia, GERD (gastroesophageal reflux disease), High cholesterol, Hyperglycemia, Hypertension, Localized edema, Myocardial infarction ([facility]) (2002), Osteoarthritis, Other schizophrenia ([facility]), Pneumonia (1989), Polysubstance (including opioids) dependence with physiological dependence ([facility]) (10/06/2017), Schizoaffective disorder, bipolar type ([facility]) (06/16/2017), Schizophrenia ([facility]), Shortness of breath (06/10/2017), Tobacco abuse (07/09/2017), and Type II diabetes mellitus ([facility]). here with    No diagnosis found.  [name] ***.  Healthy lifestyle habits encouraged. *** will follow up with PCP as directed. *** will return to see me in ***, sooner if needed. *** verbalizes understanding and agreement with this plan.   No orders of the defined types were placed in this encounter.    No orders of the defined types were placed in this encounter.    Shawnie Dapper, MSN, FNP-C 04/04/2023, 8:10 AM  [facility] [address] [place], [place] [address] [phone]

## 2023-04-05 ENCOUNTER — Ambulatory Visit: Payer: Medicaid Other | Admitting: Family Medicine

## 2023-04-05 ENCOUNTER — Encounter: Payer: Self-pay | Admitting: Family Medicine

## 2023-04-05 VITALS — BP 151/86 | HR 87 | Ht 74.0 in | Wt 293.5 lb

## 2023-04-05 DIAGNOSIS — I639 Cerebral infarction, unspecified: Secondary | ICD-10-CM | POA: Diagnosis not present

## 2023-04-05 DIAGNOSIS — E0842 Diabetes mellitus due to underlying condition with diabetic polyneuropathy: Secondary | ICD-10-CM

## 2023-04-05 DIAGNOSIS — R2681 Unsteadiness on feet: Secondary | ICD-10-CM

## 2023-04-05 DIAGNOSIS — I10 Essential (primary) hypertension: Secondary | ICD-10-CM | POA: Diagnosis not present

## 2023-04-05 DIAGNOSIS — E785 Hyperlipidemia, unspecified: Secondary | ICD-10-CM

## 2023-04-05 NOTE — Progress Notes (Signed)
Chief Complaint  Patient presents with   Follow-up    Pt in room 1. Here for stroke. Pt walks with cane states he fell last month. Pt reports he has been doing well.     HISTORY OF PRESENT ILLNESS:  04/05/23 ALL:  Charles Orr is a 62 y.o. male here today for follow up for  history of CVA 07/11/2022. He was last seen by Dr Pearlean Brownie 09/2022 and doing fairly well. Since, he reports doing well. He is a poor historian. He reports that his blood is usually 140-150. Unclear of diastolic. He is taking losartan daily and states that he takes clonidine but unsure of dose. He is taking an aspirin but thought it was 325mg . He stopped taking atorvastatin. He isn't sure why and reports being unaware that he should be taking it for stroke prevention. He continues Jardiance, Janumet, Novolog and Lantus. He is followed by Dr Yetta Barre with [facility]. He has follow up 04/20/2023. He has difficulty with decreased vision. He receives assistance with transportation through ACT team. He lives alone. He reports a fall recently due to trying to walk up a hill at his apartment. He can not drive.    HISTORY (copied from Dr Marlis Edelson previous note)  HPI: Charles Orr is a 62 year old African-American male seen today for initial office follow-up visit for stroke.  History is obtained from the patient and review of electronic medical records.  I have personally reviewed pertinent available imaging films in PACS. He has past medical history of diabetes, hypertension, hyperlipidemia, schizophrenia, diabetic retinopathy with bilateral visual impairment, cocaine abuse, gastroesophageal reflux disease and coronary artery disease.  Patient was seen by neurology on 07/11/2022 as needed for uncontrolled hypertension and dizziness has been going on for several weeks.  MRI scan showed a acute to subacute small left-sided corpus callosum and adjacent frontal white matter lacunar infarct which was felt to be clinically asymptomatic.  His  dizziness and gait imbalance are felt to be combination of his underlying chronic diabetic neuropathy as well as poor vision CT angiogram showed moderate left P2 stenosis as well as P2/P3 stenosis echocardiogram showed ejection fraction of 50.  LDL cholesterol was optimal at 53 mg percent but hemoglobin A1c was elevated at 8.9.  He was advised to be on aspirin and Plavix for 3 weeks followed by aspirin alone.  He returned to the emergency room on 07/27/2022 with new complaints of trouble seeing out of his right eye tingling.  Repeat MRI brain showed no new infarct or acute findings.  He states he still on aspirin and Plavix and tolerating it well without bruising or bleeding.  His blood pressure is better controlled and today it is 136/91.  His fasting sugars.  Doing better in the 80s to 90.  Continues to have poor vision in both eyes remains dizzy and off balance.  Patient is Tingling and numbness in his feet and takes gabapentin 600 twice daily helpful.  He uses a cane to ambulate and states he has had no falls or injuries  even though his vision is quite limited.  He has quit smoking completely is currently on a nicotine patch.  He is also not to use cocaine.  No new complaints today.   REVIEW OF SYSTEMS: Out of a complete 14 system review of symptoms, the patient complains only of the following symptoms, hard of hearing, numbness, tingling in feet, vision deficits, imbalance and all other reviewed systems are negative.   ALLERGIES: No  Known Allergies   HOME MEDICATIONS: Outpatient Medications Prior to Visit  Medication Sig Dispense Refill   aspirin 81 MG chewable tablet Chew 1 tablet (81 mg total) by mouth daily.     atorvastatin (LIPITOR) 20 MG tablet Take 20 mg by mouth at bedtime.     dorzolamide-timolol (COSOPT) 22.3-6.8 MG/ML ophthalmic solution Place 1 drop into the right eye 2 (two) times daily.     Empagliflozin (JARDIANCE PO) Take 25 mg by mouth every morning. 45 mg BID      ergocalciferol (VITAMIN D2) 1.25 MG (50000 UT) capsule Take 50,000 Units by mouth every Monday.     gabapentin (NEURONTIN) 600 MG tablet Take 600 mg by mouth in the morning, at noon, and at bedtime.     hydrOXYzine (VISTARIL) 50 MG capsule Take 100 mg by mouth See admin instructions. Take 100 mg by mouth at bedtime and an additional 100 mg once a day as needed for anxiety     Insulin Aspart FlexPen (NOVOLOG) 100 UNIT/ML Inject 16 Units into the skin 3 (three) times daily before meals.     insulin glargine (LANTUS) 100 UNIT/ML injection Inject 0.22 mLs (22 Units total) into the skin in the morning. (Patient taking differently: Inject 30 Units into the skin 2 (two) times daily.) 10 mL 0   JANUMET 50-1000 MG tablet Take 1 tablet by mouth in the morning and at bedtime.     losartan (COZAAR) 25 MG tablet Take 1 tablet (25 mg total) by mouth daily. 90 tablet 3   nicotine (NICODERM CQ - DOSED IN MG/24 HOURS) 21 mg/24hr patch Place 1 patch (21 mg total) onto the skin daily. (Patient not taking: Reported on 04/05/2023) 28 patch 2   No facility-administered medications prior to visit.     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   Community acquired pneumonia of right lower lobe of lung    Degenerative disc disease, lumbar    Depression    DM (diabetes mellitus) (HCC)    Fibromyalgia    GERD (gastroesophageal reflux disease)    High cholesterol    Hyperglycemia    Hypertension    Localized edema    Myocardial infarction (HCC) 2002   Osteoarthritis    Other schizophrenia (HCC)    Pneumonia 1989   Polysubstance (including  opioids) dependence with physiological dependence (HCC) 10/06/2017   Schizoaffective disorder, bipolar type (HCC) 06/16/2017   Schizophrenia (HCC)    Shortness of breath 06/10/2017   Tobacco abuse 07/09/2017   Type II diabetes mellitus (HCC)      PAST SURGICAL HISTORY: Past Surgical History:  Procedure Laterality Date   CARDIAC CATHETERIZATION  12/2006   [name] 01/18/2011   INGUINAL HERNIA REPAIR Right    LEFT HEART CATH AND CORONARY ANGIOGRAPHY N/A 07/11/2017   Procedure: LEFT HEART CATH AND CORONARY ANGIOGRAPHY;  Surgeon: Tonny Bollman, MD;  Location: [facility] INVASIVE CV LAB;  Service: Cardiovascular;  Laterality: N/A;   PARATHYROIDECTOMY     PERICARDIAL TAP  2000   ?; in [place]/notes 01/18/2011     FAMILY HISTORY: Family History  Problem Relation Age of Onset  Hypertension Mother    Diabetes Mother    Heart disease Father    Hypertension Sister    Diabetes Sister    Hypertension Brother    Diabetes Brother    Hypertension Sister    Diabetes Sister    Hypertension Brother    Diabetes Brother      SOCIAL HISTORY: Social History   Socioeconomic History   Marital status: Divorced    Spouse name: Not on file   Number of children: 1   Years of education: Not on file   Highest education level: GED or equivalent  Occupational History   Not on file  Tobacco Use   Smoking status: Every Day    Current packs/day: 0.50    Average packs/day: 0.5 packs/day for 38.0 years (19.0 ttl pk-yrs)    Types: Cigarettes   Smokeless tobacco: Current    Types: Chew  Vaping Use   Vaping status: Never Used  Substance and Sexual Activity   Alcohol use: Yes    Comment: 06/12/2017 "stopped ~ 1 yr ago"    Drug use: Yes    Types: Cocaine    Comment: 06/12/2017 "stopped using cocaine ~ 1 yr ago; used marijuana when I was younger" last cocaine use 9 mths ago per pt on 06/15/17   Sexual activity: Not Currently  Other Topics Concern   Not on file  Social History Narrative   Not on file    Social Determinants of Health   Financial Resource Strain: Not on file  Food Insecurity: Not on file  Transportation Needs: Not on file  Physical Activity: Not on file  Stress: Not on file  Social Connections: Not on file  Intimate Partner Violence: Not on file     PHYSICAL EXAM  Vitals:   04/05/23 1055 04/05/23 1101  BP: (!) 161/85 (!) 151/86  Pulse: 88 87  Weight: 293 lb 8 oz (133.1 kg)   Height: 6\' 2"  (1.88 m)    Body mass index is 37.68 kg/m.  Generalized: Well developed, in no acute distress, hard of hearing   Cardiology: normal rate and rhythm, no murmur auscultated  Respiratory: clear to auscultation bilaterally    Neurological examination  Mentation: Alert oriented to time, place, and some history taking. Follows all commands speech and language fluent Cranial nerve II-XII: Pupils were equal round reactive to light. Extraocular movements were full, visual field were restricted bilaterally. Facial sensation and strength were normal. Uvula tongue midline. Head turning and shoulder shrug  were normal and symmetric. Motor: The motor testing reveals 5 over 5 strength of all 4 extremities. Good symmetric motor tone is noted throughout.  Sensory: Sensory testing is intact to soft touch on all 4 extremities. No evidence of extinction is noted.  Coordination: Cerebellar testing reveals good finger-nose-finger and heel-to-shin bilaterally.  Gait and station: pushes slowly to standing position, ataxic gait, stable with cane, unable to tandem Reflexes: Deep tendon reflexes are symmetric and normal bilaterally.    DIAGNOSTIC DATA (LABS, IMAGING, TESTING) - I reviewed patient records, labs, notes, testing and imaging myself where available.  Lab Results  Component Value Date   WBC 7.1 08/14/2022   HGB 12.8 (L) 08/14/2022   HCT 38.3 (L) 08/14/2022   MCV 78.0 (L) 08/14/2022   PLT 264 08/14/2022      Component Value Date/Time   NA 136 08/14/2022 1832   K 4.7 08/14/2022  1832   CL 105 08/14/2022 1832   CO2 21 (L) 08/14/2022 1832   GLUCOSE 291 (  H) 08/14/2022 1832   BUN 25 (H) 08/14/2022 1832   CREATININE 1.35 (H) 08/14/2022 1832   CALCIUM 9.2 08/14/2022 1832   PROT 7.0 08/14/2022 1832   ALBUMIN 3.8 08/14/2022 1832   AST 28 08/14/2022 1832   ALT 28 08/14/2022 1832   ALKPHOS 99 08/14/2022 1832   BILITOT 0.5 08/14/2022 1832   GFRNONAA >60 08/14/2022 1832   GFRAA >60 02/06/2020 1012   Lab Results  Component Value Date   CHOL 100 07/11/2022   HDL 32 (L) 07/11/2022   LDLCALC 53 07/11/2022   TRIG 76 07/11/2022   CHOLHDL 3.1 07/11/2022   Lab Results  Component Value Date   HGBA1C 8.9 (H) 07/11/2022   Lab Results  Component Value Date   VITAMINB12 420 07/11/2022   Lab Results  Component Value Date   TSH 1.810 07/12/2022        No data to display               No data to display           ASSESSMENT AND PLAN  62 y.o. 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) (10/06/2017), Community acquired pneumonia of right lower lobe of lung, Degenerative disc disease, lumbar, Depression, DM (diabetes mellitus) (HCC), Fibromyalgia, GERD (gastroesophageal reflux disease), High cholesterol, Hyperglycemia, Hypertension, Localized edema, Myocardial infarction (HCC) (2002), Osteoarthritis, Other schizophrenia (HCC), Pneumonia (1989), Polysubstance (including opioids) dependence with physiological dependence (HCC) (10/06/2017), Schizoaffective disorder, bipolar type (HCC) (06/16/2017), Schizophrenia (HCC), Shortness of breath (06/10/2017), Tobacco abuse (07/09/2017), and Type II diabetes mellitus (HCC). here with    No diagnosis found.  Charles Orr reports doing well but has not taken medicaitons as directed. I have spoken with Charles Orr, ACT Team and printed instructions for patient to take to his PCP. I recommend he be on aspirin 81mg  and atorvastatin 20mg  daily. Medications listed as current but patient is unclear on what he is taking. BP is elevated. I will ask staff to contact pharmacy to verify medication list. Fall precautions advised. He will continue working with ACT Team for social support. Healthy lifestyle habits encouraged. He will follow up with PCP as directed. He will return to see Dr Pearlean Brownie in 6 months, sooner if needed. He verbalizes understanding and agreement with this plan.   No orders of the defined types were placed in this encounter.    No orders of the defined types were placed in this encounter.   I spent 40 minutes of face-to-face and non-face-to-face time with patient.  This included previsit chart review, lab review, study review, order entry, electronic health record documentation, patient education.   Charles Dapper, MSN, FNP-C 04/05/2023, 11:15 AM  [facility] [address] [place], [place] [address] [phone]

## 2023-04-05 NOTE — Patient Instructions (Signed)
Below is our plan:  I need you to check with your primary care provider regarding your blood pressure and your cholesterol medication. I recommend that you are taking aspirin 81mg  and atorvastatin 20mg  every day. Your blood pressure is elevated. I have that you are taking losartan for blood pressure management. I will call the pharmacy about the clonidine dose. Please make sure you are taking these medicaitons every day.   Goals:  1) Maintain strict control of hypertension with blood pressure goal below 130/90 2) Maintain good control of diabetes with hemoglobin A1c goal below 7%  3) Maintain good control of lipids with LDL cholesterol goal below 70 mg/dL.  4) Eat a healthy diet with plenty of whole grains, cereals, fruits and vegetables, exercise regularly and maintain ideal body weight   Resources: https://www.williams.biz/  Please make sure you are staying well hydrated. I recommend 50-60 ounces daily. Well balanced diet and regular exercise encouraged. Consistent sleep schedule with 6-8 hours recommended.   Please continue follow up with care team as directed.   Follow up with Dr Pearlean Brownie in 6 months   You may receive a survey regarding today's visit. I encourage you to leave honest feed back as I do use this information to improve patient care. Thank you for seeing me today!

## 2023-04-11 ENCOUNTER — Telehealth: Payer: Self-pay

## 2023-04-11 NOTE — Telephone Encounter (Signed)
Called pharmacy and they stated that: Xalantoprost eyedrop Vraylar 1.5mg  cap every day Novolog insulin 16 units tid Janumet 50-1000 1 tab bid Trazadone 100mg  every day Gabapentin 600mg  1 tab tid Dorzolamide/timolol eyedrop Losartan 25mg  every day Lipitor 20mg  every day Clonidine.1mg  bid Jardiance 25mg  every day Nexium 40mg  every day Vitamin d 72536 q7d Vistaril 50mg  capsule 2 caps bid Lantus solostar he is not taking or noncompliant

## 2023-04-11 NOTE — Telephone Encounter (Signed)
Med list updated

## 2023-04-11 NOTE — Telephone Encounter (Signed)
-----   Message from Amy Lomax sent at 04/05/2023  2:35 PM EDT ----- Patient was unsure what meds he was taking and could not see our med list. Can you guys verify with pharmacy if what we have is correct, please?

## 2023-05-10 ENCOUNTER — Ambulatory Visit (INDEPENDENT_AMBULATORY_CARE_PROVIDER_SITE_OTHER): Payer: MEDICAID | Admitting: Podiatry

## 2023-05-10 ENCOUNTER — Encounter: Payer: Self-pay | Admitting: Podiatry

## 2023-05-10 DIAGNOSIS — E1169 Type 2 diabetes mellitus with other specified complication: Secondary | ICD-10-CM

## 2023-05-10 DIAGNOSIS — B351 Tinea unguium: Secondary | ICD-10-CM

## 2023-05-10 DIAGNOSIS — M79674 Pain in right toe(s): Secondary | ICD-10-CM

## 2023-05-10 DIAGNOSIS — Z794 Long term (current) use of insulin: Secondary | ICD-10-CM

## 2023-05-10 DIAGNOSIS — M79675 Pain in left toe(s): Secondary | ICD-10-CM | POA: Diagnosis not present

## 2023-05-10 NOTE — Progress Notes (Signed)
This patient returns to my office for at risk foot care.  This patient requires this care by a professional since this patient will be at risk due to having diabetes.  This patient is unable to cut nails himself since the patient cannot reach his nails.These nails are painful walking and wearing shoes.  This patient presents for at risk foot care today.  General Appearance  Alert, conversant and in no acute stress.  Vascular  Dorsalis pedis and posterior tibial  pulses are  weakly palpable  bilaterally.  Capillary return is within normal limits  bilaterally. Temperature is within normal limits  bilaterally.  Neurologic  Senn-Weinstein monofilament wire test within normal limits  bilaterally. Muscle power within normal limits bilaterally.  Nails Thick disfigured discolored nails with subungual debris  from hallux to fifth toes bilaterally. No evidence of bacterial infection or drainage bilaterally.  Orthopedic  No limitations of motion  feet .  No crepitus or effusions noted.  No bony pathology or digital deformities noted.  Skin  normotropic skin with no porokeratosis noted bilaterally.  No signs of infections or ulcers noted.     Onychomycosis  Pain in right toes  Pain in left toes  Consent was obtained for treatment procedures.   Mechanical debridement of nails 1-5  bilaterally performed with a nail nipper.  Filed with dremel without incident.    Return office visit     3 months                 Told patient to return for periodic foot care and evaluation due to potential at risk complications.   Gregory Mayer DPM   

## 2023-06-13 ENCOUNTER — Ambulatory Visit: Payer: Medicaid Other | Admitting: Family Medicine

## 2023-08-09 ENCOUNTER — Ambulatory Visit: Payer: MEDICAID | Admitting: Podiatry

## 2023-09-25 ENCOUNTER — Ambulatory Visit (INDEPENDENT_AMBULATORY_CARE_PROVIDER_SITE_OTHER): Payer: MEDICAID | Admitting: Podiatry

## 2023-09-25 ENCOUNTER — Encounter: Payer: Self-pay | Admitting: Podiatry

## 2023-09-25 DIAGNOSIS — B351 Tinea unguium: Secondary | ICD-10-CM

## 2023-09-25 DIAGNOSIS — E1169 Type 2 diabetes mellitus with other specified complication: Secondary | ICD-10-CM | POA: Diagnosis not present

## 2023-09-25 DIAGNOSIS — M79674 Pain in right toe(s): Secondary | ICD-10-CM

## 2023-09-25 DIAGNOSIS — M79675 Pain in left toe(s): Secondary | ICD-10-CM | POA: Diagnosis not present

## 2023-09-25 DIAGNOSIS — Z794 Long term (current) use of insulin: Secondary | ICD-10-CM

## 2023-09-25 NOTE — Progress Notes (Signed)
This patient returns to my office for at risk foot care.  This patient requires this care by a professional since this patient will be at risk due to having diabetes.  This patient is unable to cut nails himself since the patient cannot reach his nails.These nails are painful walking and wearing shoes.  This patient presents for at risk foot care today.  General Appearance  Alert, conversant and in no acute stress.  Vascular  Dorsalis pedis and posterior tibial  pulses are  weakly palpable  bilaterally.  Capillary return is within normal limits  bilaterally. Temperature is within normal limits  bilaterally.  Neurologic  Senn-Weinstein monofilament wire test within normal limits  bilaterally. Muscle power within normal limits bilaterally.  Nails Thick disfigured discolored nails with subungual debris  from hallux to fifth toes bilaterally. No evidence of bacterial infection or drainage bilaterally.  Orthopedic  No limitations of motion  feet .  No crepitus or effusions noted.  No bony pathology or digital deformities noted.  Skin  normotropic skin with no porokeratosis noted bilaterally.  No signs of infections or ulcers noted.     Onychomycosis  Pain in right toes  Pain in left toes  Consent was obtained for treatment procedures.   Mechanical debridement of nails 1-5  bilaterally performed with a nail nipper.  Filed with dremel without incident.    Return office visit     3 months                 Told patient to return for periodic foot care and evaluation due to potential at risk complications.   Desiray Orchard DPM   

## 2023-10-25 ENCOUNTER — Ambulatory Visit: Payer: MEDICAID | Admitting: Neurology

## 2023-12-24 ENCOUNTER — Ambulatory Visit (INDEPENDENT_AMBULATORY_CARE_PROVIDER_SITE_OTHER): Payer: MEDICAID | Admitting: Podiatry

## 2023-12-24 ENCOUNTER — Encounter: Payer: Self-pay | Admitting: Podiatry

## 2023-12-24 DIAGNOSIS — B351 Tinea unguium: Secondary | ICD-10-CM

## 2023-12-24 DIAGNOSIS — M79675 Pain in left toe(s): Secondary | ICD-10-CM | POA: Diagnosis not present

## 2023-12-24 DIAGNOSIS — E1169 Type 2 diabetes mellitus with other specified complication: Secondary | ICD-10-CM

## 2023-12-24 DIAGNOSIS — M79674 Pain in right toe(s): Secondary | ICD-10-CM | POA: Diagnosis not present

## 2023-12-24 NOTE — Progress Notes (Signed)
 This patient returns to my office for at risk foot care.  This patient requires this care by a professional since this patient will be at risk due to having diabetes.  This patient is unable to cut nails himself since the patient cannot reach his nails.These nails are painful walking and wearing shoes.  This patient presents for at risk foot care today.  General Appearance  Alert, conversant and in no acute stress.  Vascular  Dorsalis pedis and posterior tibial  pulses are weakly  palpable  bilaterally.  Capillary return is within normal limits  bilaterally. Temperature is within normal limits  bilaterally.  Neurologic  Senn-Weinstein monofilament wire test within normal limits  bilaterally. Muscle power within normal limits bilaterally.  Nails Thick disfigured discolored nails with subungual debris  from hallux to fifth toes bilaterally. No evidence of bacterial infection or drainage bilaterally.  Orthopedic  No limitations of motion  feet .  No crepitus or effusions noted.  No bony pathology or digital deformities noted.  Skin  normotropic skin with no porokeratosis noted bilaterally.  No signs of infections or ulcers noted.     Onychomycosis  Pain in right toes  Pain in left toes  Consent was obtained for treatment procedures.   Mechanical debridement of nails 1-5  bilaterally performed with a nail nipper.  Filed with dremel without incident.    Return office visit     3 months                 Told patient to return for periodic foot care and evaluation due to potential at risk complications.   Helane Gunther DPM

## 2024-03-24 ENCOUNTER — Ambulatory Visit (INDEPENDENT_AMBULATORY_CARE_PROVIDER_SITE_OTHER): Payer: MEDICAID | Admitting: Podiatry

## 2024-03-24 ENCOUNTER — Encounter: Payer: Self-pay | Admitting: Podiatry

## 2024-03-24 DIAGNOSIS — E1169 Type 2 diabetes mellitus with other specified complication: Secondary | ICD-10-CM

## 2024-03-24 DIAGNOSIS — B351 Tinea unguium: Secondary | ICD-10-CM

## 2024-03-24 DIAGNOSIS — M79674 Pain in right toe(s): Secondary | ICD-10-CM

## 2024-03-24 DIAGNOSIS — M79675 Pain in left toe(s): Secondary | ICD-10-CM

## 2024-03-24 NOTE — Progress Notes (Signed)
 This patient returns to my office for at risk foot care.  This patient requires this care by a professional since this patient will be at risk due to having diabetes.  This patient is unable to cut nails himself since the patient cannot reach his nails.These nails are painful walking and wearing shoes.  This patient presents for at risk foot care today.  General Appearance  Alert, conversant and in no acute stress.  Vascular  Dorsalis pedis and posterior tibial  pulses are weakly  palpable  bilaterally.  Capillary return is within normal limits  bilaterally. Temperature is within normal limits  bilaterally.  Neurologic  Senn-Weinstein monofilament wire test within normal limits  bilaterally. Muscle power within normal limits bilaterally.  Nails Thick disfigured discolored nails with subungual debris  from hallux to fifth toes bilaterally. No evidence of bacterial infection or drainage bilaterally.  Orthopedic  No limitations of motion  feet .  No crepitus or effusions noted.  No bony pathology or digital deformities noted.  Skin  normotropic skin with no porokeratosis noted bilaterally.  No signs of infections or ulcers noted.     Onychomycosis  Pain in right toes  Pain in left toes  Consent was obtained for treatment procedures.   Mechanical debridement of nails 1-5  bilaterally performed with a nail nipper.  Filed with dremel without incident.    Return office visit     3 months                 Told patient to return for periodic foot care and evaluation due to potential at risk complications.   Helane Gunther DPM

## 2024-04-04 ENCOUNTER — Other Ambulatory Visit: Payer: Self-pay | Admitting: Nephrology

## 2024-04-04 DIAGNOSIS — N1832 Chronic kidney disease, stage 3b: Secondary | ICD-10-CM

## 2024-04-08 ENCOUNTER — Ambulatory Visit
Admission: RE | Admit: 2024-04-08 | Discharge: 2024-04-08 | Disposition: A | Discharge status: Home / Self Care | Payer: MEDICAID | Source: Ambulatory Visit | Attending: Nephrology | Admitting: Nephrology

## 2024-04-08 DIAGNOSIS — N1832 Chronic kidney disease, stage 3b: Secondary | ICD-10-CM

## 2024-06-25 ENCOUNTER — Ambulatory Visit: Payer: MEDICAID | Admitting: Podiatry

## 2024-07-08 ENCOUNTER — Ambulatory Visit: Payer: MEDICAID | Admitting: Podiatry

## 2024-07-08 ENCOUNTER — Encounter: Payer: Self-pay | Admitting: Podiatry

## 2024-07-08 DIAGNOSIS — M79674 Pain in right toe(s): Secondary | ICD-10-CM

## 2024-07-08 DIAGNOSIS — Z794 Long term (current) use of insulin: Secondary | ICD-10-CM | POA: Diagnosis not present

## 2024-07-08 DIAGNOSIS — E1169 Type 2 diabetes mellitus with other specified complication: Secondary | ICD-10-CM

## 2024-07-08 DIAGNOSIS — B351 Tinea unguium: Secondary | ICD-10-CM | POA: Diagnosis not present

## 2024-07-08 DIAGNOSIS — M79675 Pain in left toe(s): Secondary | ICD-10-CM

## 2024-07-08 NOTE — Progress Notes (Signed)
 This patient returns to my office for at risk foot care.  This patient requires this care by a professional since this patient will be at risk due to having diabetes.  This patient is unable to cut nails himself since the patient cannot reach his nails.These nails are painful walking and wearing shoes.  This patient presents for at risk foot care today.  General Appearance  Alert, conversant and in no acute stress.  Vascular  Dorsalis pedis and posterior tibial  pulses are weakly  palpable  bilaterally.  Capillary return is within normal limits  bilaterally. Temperature is within normal limits  bilaterally.  Neurologic  Senn-Weinstein monofilament wire test within normal limits  bilaterally. Muscle power within normal limits bilaterally.  Nails Thick disfigured discolored nails with subungual debris  from hallux to fifth toes bilaterally. No evidence of bacterial infection or drainage bilaterally.  Orthopedic  No limitations of motion  feet .  No crepitus or effusions noted.  No bony pathology or digital deformities noted.  Skin  normotropic skin with no porokeratosis noted bilaterally.  No signs of infections or ulcers noted.     Onychomycosis  Pain in right toes  Pain in left toes  Consent was obtained for treatment procedures.   Mechanical debridement of nails 1-5  bilaterally performed with a nail nipper.  Filed with dremel without incident.    Return office visit     3 months                 Told patient to return for periodic foot care and evaluation due to potential at risk complications.   Helane Gunther DPM
